# Patient Record
Sex: Male | Born: 1940 | Race: White | Hispanic: No | Marital: Married | Smoking: Never smoker
Health system: Southern US, Community
[De-identification: ages and names within clinical notes are randomized; demographics above are authoritative.]

## PROBLEM LIST (undated history)

## (undated) DIAGNOSIS — I4891 Unspecified atrial fibrillation: Secondary | ICD-10-CM

## (undated) DIAGNOSIS — E119 Type 2 diabetes mellitus without complications: Secondary | ICD-10-CM

## (undated) DIAGNOSIS — G629 Polyneuropathy, unspecified: Secondary | ICD-10-CM

## (undated) DIAGNOSIS — I498 Other specified cardiac arrhythmias: Secondary | ICD-10-CM

## (undated) DIAGNOSIS — I10 Essential (primary) hypertension: Secondary | ICD-10-CM

## (undated) DIAGNOSIS — E785 Hyperlipidemia, unspecified: Secondary | ICD-10-CM

## (undated) DIAGNOSIS — R0989 Other specified symptoms and signs involving the circulatory and respiratory systems: Secondary | ICD-10-CM

## (undated) DIAGNOSIS — I639 Cerebral infarction, unspecified: Secondary | ICD-10-CM

## (undated) HISTORY — DX: Cerebral infarction, unspecified: I63.9

## (undated) HISTORY — DX: Hyperlipidemia, unspecified: E78.5

## (undated) HISTORY — PX: ANAL FISSURE REPAIR: SHX2312

## (undated) HISTORY — DX: Type 2 diabetes mellitus without complications: E11.9

## (undated) HISTORY — DX: Unspecified atrial fibrillation: I48.91

## (undated) HISTORY — DX: Essential (primary) hypertension: I10

## (undated) HISTORY — DX: Other specified symptoms and signs involving the circulatory and respiratory systems: R09.89

## (undated) HISTORY — DX: Other specified cardiac arrhythmias: I49.8

## (undated) HISTORY — DX: Polyneuropathy, unspecified: G62.9

---

## 1999-01-30 ENCOUNTER — Encounter: Admission: RE | Admit: 1999-01-30 | Discharge: 1999-04-30 | Payer: Self-pay | Admitting: Orthopedic Surgery

## 2001-01-29 ENCOUNTER — Encounter: Admission: RE | Admit: 2001-01-29 | Discharge: 2001-01-29 | Payer: Self-pay | Admitting: Cardiology

## 2001-01-29 ENCOUNTER — Encounter: Payer: Self-pay | Admitting: Cardiology

## 2001-02-02 ENCOUNTER — Ambulatory Visit (HOSPITAL_COMMUNITY): Admission: RE | Admit: 2001-02-02 | Discharge: 2001-02-02 | Payer: Self-pay | Admitting: Cardiology

## 2001-02-02 HISTORY — PX: CARDIAC CATHETERIZATION: SHX172

## 2001-09-30 DIAGNOSIS — I639 Cerebral infarction, unspecified: Secondary | ICD-10-CM

## 2001-09-30 HISTORY — DX: Cerebral infarction, unspecified: I63.9

## 2002-06-16 ENCOUNTER — Encounter: Admission: RE | Admit: 2002-06-16 | Discharge: 2002-06-16 | Payer: Self-pay | Admitting: Internal Medicine

## 2002-06-16 ENCOUNTER — Encounter: Payer: Self-pay | Admitting: Internal Medicine

## 2002-07-19 ENCOUNTER — Encounter: Payer: Self-pay | Admitting: Neurology

## 2002-07-19 ENCOUNTER — Ambulatory Visit (HOSPITAL_COMMUNITY): Admission: RE | Admit: 2002-07-19 | Discharge: 2002-07-19 | Payer: Self-pay | Admitting: Neurology

## 2004-08-14 ENCOUNTER — Ambulatory Visit: Payer: Self-pay | Admitting: Internal Medicine

## 2004-09-04 ENCOUNTER — Ambulatory Visit: Payer: Self-pay | Admitting: Internal Medicine

## 2004-11-15 ENCOUNTER — Ambulatory Visit: Payer: Self-pay | Admitting: Internal Medicine

## 2005-01-16 ENCOUNTER — Ambulatory Visit: Payer: Self-pay | Admitting: Internal Medicine

## 2005-04-17 ENCOUNTER — Ambulatory Visit: Payer: Self-pay | Admitting: Internal Medicine

## 2005-07-17 ENCOUNTER — Ambulatory Visit: Payer: Self-pay | Admitting: Internal Medicine

## 2005-08-14 ENCOUNTER — Ambulatory Visit: Payer: Self-pay | Admitting: Internal Medicine

## 2005-10-03 ENCOUNTER — Ambulatory Visit: Payer: Self-pay | Admitting: Internal Medicine

## 2006-02-20 ENCOUNTER — Ambulatory Visit: Payer: Self-pay | Admitting: Internal Medicine

## 2006-05-06 ENCOUNTER — Ambulatory Visit: Payer: Self-pay | Admitting: Internal Medicine

## 2006-05-13 ENCOUNTER — Ambulatory Visit: Payer: Self-pay | Admitting: Internal Medicine

## 2006-06-05 ENCOUNTER — Ambulatory Visit: Payer: Self-pay | Admitting: Gastroenterology

## 2006-08-13 ENCOUNTER — Ambulatory Visit: Payer: Self-pay | Admitting: Internal Medicine

## 2006-08-15 ENCOUNTER — Ambulatory Visit: Payer: Self-pay | Admitting: Gastroenterology

## 2006-09-01 ENCOUNTER — Encounter (INDEPENDENT_AMBULATORY_CARE_PROVIDER_SITE_OTHER): Payer: Self-pay | Admitting: Specialist

## 2006-09-01 ENCOUNTER — Ambulatory Visit: Payer: Self-pay | Admitting: Gastroenterology

## 2006-09-01 ENCOUNTER — Encounter: Payer: Self-pay | Admitting: Internal Medicine

## 2006-09-10 ENCOUNTER — Ambulatory Visit: Payer: Self-pay | Admitting: Internal Medicine

## 2006-10-30 ENCOUNTER — Ambulatory Visit: Payer: Self-pay | Admitting: Internal Medicine

## 2006-12-08 ENCOUNTER — Ambulatory Visit: Payer: Self-pay | Admitting: Internal Medicine

## 2007-02-16 ENCOUNTER — Ambulatory Visit: Payer: Self-pay | Admitting: Internal Medicine

## 2007-02-17 LAB — CONVERTED CEMR LAB: Hgb A1c MFr Bld: 10.1 % — ABNORMAL HIGH (ref 4.6–6.0)

## 2007-02-24 ENCOUNTER — Encounter: Payer: Self-pay | Admitting: Internal Medicine

## 2007-02-24 DIAGNOSIS — Z8679 Personal history of other diseases of the circulatory system: Secondary | ICD-10-CM | POA: Insufficient documentation

## 2007-02-24 DIAGNOSIS — E785 Hyperlipidemia, unspecified: Secondary | ICD-10-CM | POA: Insufficient documentation

## 2007-02-24 DIAGNOSIS — G609 Hereditary and idiopathic neuropathy, unspecified: Secondary | ICD-10-CM | POA: Insufficient documentation

## 2007-02-24 DIAGNOSIS — I10 Essential (primary) hypertension: Secondary | ICD-10-CM | POA: Insufficient documentation

## 2007-02-24 DIAGNOSIS — E1140 Type 2 diabetes mellitus with diabetic neuropathy, unspecified: Secondary | ICD-10-CM | POA: Insufficient documentation

## 2007-03-03 ENCOUNTER — Ambulatory Visit: Payer: Self-pay | Admitting: Internal Medicine

## 2007-03-31 ENCOUNTER — Ambulatory Visit: Payer: Self-pay | Admitting: Internal Medicine

## 2007-04-14 ENCOUNTER — Encounter: Payer: Self-pay | Admitting: Internal Medicine

## 2007-04-14 ENCOUNTER — Ambulatory Visit: Payer: Self-pay | Admitting: Internal Medicine

## 2007-05-12 ENCOUNTER — Ambulatory Visit: Payer: Self-pay | Admitting: Internal Medicine

## 2007-06-16 ENCOUNTER — Ambulatory Visit: Payer: Self-pay | Admitting: Internal Medicine

## 2007-06-16 DIAGNOSIS — L259 Unspecified contact dermatitis, unspecified cause: Secondary | ICD-10-CM | POA: Insufficient documentation

## 2007-08-07 ENCOUNTER — Ambulatory Visit: Payer: Self-pay | Admitting: Internal Medicine

## 2007-08-07 LAB — CONVERTED CEMR LAB: Hgb A1c MFr Bld: 7.8 % — ABNORMAL HIGH (ref 4.6–6.0)

## 2007-08-12 ENCOUNTER — Ambulatory Visit: Payer: Self-pay | Admitting: Internal Medicine

## 2007-09-03 ENCOUNTER — Telehealth: Payer: Self-pay | Admitting: Internal Medicine

## 2007-11-03 ENCOUNTER — Ambulatory Visit: Payer: Self-pay | Admitting: Internal Medicine

## 2007-11-03 LAB — CONVERTED CEMR LAB
Creatinine,U: 75.5 mg/dL
Microalb, Ur: 0.4 mg/dL (ref 0.0–1.9)

## 2007-11-12 ENCOUNTER — Telehealth: Payer: Self-pay | Admitting: Internal Medicine

## 2007-12-14 ENCOUNTER — Telehealth: Payer: Self-pay | Admitting: Internal Medicine

## 2008-03-25 ENCOUNTER — Ambulatory Visit: Payer: Self-pay | Admitting: Internal Medicine

## 2008-03-28 ENCOUNTER — Encounter: Payer: Self-pay | Admitting: Internal Medicine

## 2008-03-28 LAB — CONVERTED CEMR LAB
ALT: 22 units/L (ref 0–53)
Albumin: 4.1 g/dL (ref 3.5–5.2)
Alkaline Phosphatase: 53 units/L (ref 39–117)
BUN: 25 mg/dL — ABNORMAL HIGH (ref 6–23)
CO2: 26 meq/L (ref 19–32)
Eosinophils Relative: 2.2 % (ref 0.0–5.0)
GFR calc Af Amer: 65 mL/min
Glucose, Bld: 184 mg/dL — ABNORMAL HIGH (ref 70–99)
HCT: 47.3 % (ref 39.0–52.0)
Hemoglobin: 16.3 g/dL (ref 13.0–17.0)
Lymphocytes Relative: 26.8 % (ref 12.0–46.0)
Monocytes Absolute: 0.7 10*3/uL (ref 0.1–1.0)
Monocytes Relative: 8.3 % (ref 3.0–12.0)
Neutro Abs: 5.4 10*3/uL (ref 1.4–7.7)
PSA: 1.46 ng/mL (ref 0.10–4.00)
Platelets: 208 10*3/uL (ref 150–400)
Potassium: 4.1 meq/L (ref 3.5–5.1)
Total CHOL/HDL Ratio: 6.8
Total Protein: 6.9 g/dL (ref 6.0–8.3)
WBC: 8.8 10*3/uL (ref 4.5–10.5)

## 2008-07-22 ENCOUNTER — Encounter: Payer: Self-pay | Admitting: Internal Medicine

## 2008-09-06 ENCOUNTER — Encounter: Payer: Self-pay | Admitting: Internal Medicine

## 2008-09-07 ENCOUNTER — Encounter: Admission: RE | Admit: 2008-09-07 | Discharge: 2008-09-07 | Payer: Self-pay | Admitting: Cardiology

## 2008-09-13 ENCOUNTER — Encounter: Payer: Self-pay | Admitting: Internal Medicine

## 2008-09-13 ENCOUNTER — Ambulatory Visit (HOSPITAL_COMMUNITY): Admission: RE | Admit: 2008-09-13 | Discharge: 2008-09-13 | Payer: Self-pay | Admitting: Cardiology

## 2008-09-13 HISTORY — PX: CARDIAC CATHETERIZATION: SHX172

## 2008-10-06 ENCOUNTER — Ambulatory Visit: Payer: Self-pay | Admitting: Internal Medicine

## 2008-10-06 LAB — CONVERTED CEMR LAB: Hgb A1c MFr Bld: 7.8 % — ABNORMAL HIGH (ref 4.6–6.0)

## 2008-10-13 ENCOUNTER — Telehealth: Payer: Self-pay | Admitting: Internal Medicine

## 2008-10-20 ENCOUNTER — Encounter: Payer: Self-pay | Admitting: Internal Medicine

## 2008-11-25 ENCOUNTER — Telehealth: Payer: Self-pay | Admitting: Internal Medicine

## 2008-12-01 ENCOUNTER — Telehealth: Payer: Self-pay | Admitting: Internal Medicine

## 2009-01-23 ENCOUNTER — Telehealth (INDEPENDENT_AMBULATORY_CARE_PROVIDER_SITE_OTHER): Payer: Self-pay | Admitting: *Deleted

## 2009-01-24 ENCOUNTER — Ambulatory Visit: Payer: Self-pay | Admitting: Internal Medicine

## 2009-01-24 DIAGNOSIS — R1011 Right upper quadrant pain: Secondary | ICD-10-CM | POA: Insufficient documentation

## 2009-01-24 LAB — CONVERTED CEMR LAB
ALT: 26 units/L (ref 0–53)
Albumin: 4.1 g/dL (ref 3.5–5.2)
Basophils Relative: 0.3 % (ref 0.0–3.0)
Bilirubin, Direct: 0.1 mg/dL (ref 0.0–0.3)
Blood Glucose, Fingerstick: 255
CO2: 30 meq/L (ref 19–32)
Chloride: 103 meq/L (ref 96–112)
Creatinine, Ser: 1.2 mg/dL (ref 0.4–1.5)
Eosinophils Absolute: 0.2 10*3/uL (ref 0.0–0.7)
Eosinophils Relative: 2.6 % (ref 0.0–5.0)
HCT: 47.8 % (ref 39.0–52.0)
Hemoglobin: 16.6 g/dL (ref 13.0–17.0)
Lymphs Abs: 2 10*3/uL (ref 0.7–4.0)
MCHC: 34.7 g/dL (ref 30.0–36.0)
MCV: 81.5 fL (ref 78.0–100.0)
Monocytes Absolute: 0.7 10*3/uL (ref 0.1–1.0)
Neutro Abs: 5.7 10*3/uL (ref 1.4–7.7)
Potassium: 4.3 meq/L (ref 3.5–5.1)
RBC: 5.86 M/uL — ABNORMAL HIGH (ref 4.22–5.81)
Sodium: 140 meq/L (ref 135–145)
Total Protein: 7.1 g/dL (ref 6.0–8.3)
WBC: 8.6 10*3/uL (ref 4.5–10.5)

## 2009-01-25 ENCOUNTER — Encounter: Admission: RE | Admit: 2009-01-25 | Discharge: 2009-01-25 | Payer: Self-pay | Admitting: Internal Medicine

## 2009-01-31 ENCOUNTER — Telehealth: Payer: Self-pay | Admitting: Internal Medicine

## 2009-02-21 ENCOUNTER — Ambulatory Visit: Payer: Self-pay | Admitting: Internal Medicine

## 2009-02-28 ENCOUNTER — Encounter: Payer: Self-pay | Admitting: Internal Medicine

## 2009-04-25 ENCOUNTER — Ambulatory Visit: Payer: Self-pay | Admitting: Internal Medicine

## 2009-04-26 LAB — CONVERTED CEMR LAB: Hgb A1c MFr Bld: 8.2 % — ABNORMAL HIGH (ref 4.6–6.5)

## 2009-06-26 ENCOUNTER — Ambulatory Visit: Payer: Self-pay | Admitting: Internal Medicine

## 2009-06-26 DIAGNOSIS — M65849 Other synovitis and tenosynovitis, unspecified hand: Secondary | ICD-10-CM

## 2009-06-26 DIAGNOSIS — M65839 Other synovitis and tenosynovitis, unspecified forearm: Secondary | ICD-10-CM | POA: Insufficient documentation

## 2009-09-08 ENCOUNTER — Ambulatory Visit: Payer: Self-pay | Admitting: Internal Medicine

## 2009-09-08 LAB — CONVERTED CEMR LAB: Cholesterol: 179 mg/dL (ref 0–200)

## 2009-11-02 ENCOUNTER — Ambulatory Visit: Payer: Self-pay | Admitting: Internal Medicine

## 2009-11-02 DIAGNOSIS — J069 Acute upper respiratory infection, unspecified: Secondary | ICD-10-CM | POA: Insufficient documentation

## 2009-11-07 ENCOUNTER — Ambulatory Visit: Payer: Self-pay | Admitting: Internal Medicine

## 2009-11-07 DIAGNOSIS — R350 Frequency of micturition: Secondary | ICD-10-CM | POA: Insufficient documentation

## 2009-11-07 LAB — CONVERTED CEMR LAB
Blood in Urine, dipstick: NEGATIVE
Protein, U semiquant: NEGATIVE
Specific Gravity, Urine: 1.02
Urobilinogen, UA: 0.2

## 2010-01-10 ENCOUNTER — Ambulatory Visit: Payer: Self-pay | Admitting: Internal Medicine

## 2010-01-10 LAB — CONVERTED CEMR LAB
Blood in Urine, dipstick: NEGATIVE
Hgb A1c MFr Bld: 8.5 % — ABNORMAL HIGH (ref 4.6–6.5)
Nitrite: NEGATIVE
PSA: 1.63 ng/mL (ref 0.10–4.00)
Protein, U semiquant: NEGATIVE
Total CK: 103 units/L (ref 7–232)
WBC Urine, dipstick: NEGATIVE
pH: 5

## 2010-01-11 ENCOUNTER — Encounter: Payer: Self-pay | Admitting: Internal Medicine

## 2010-01-12 ENCOUNTER — Telehealth: Payer: Self-pay | Admitting: Internal Medicine

## 2010-01-12 ENCOUNTER — Encounter: Payer: Self-pay | Admitting: Internal Medicine

## 2010-02-02 ENCOUNTER — Telehealth: Payer: Self-pay | Admitting: Internal Medicine

## 2010-02-07 ENCOUNTER — Encounter: Payer: Self-pay | Admitting: Internal Medicine

## 2010-03-20 ENCOUNTER — Telehealth: Payer: Self-pay | Admitting: Internal Medicine

## 2010-03-20 ENCOUNTER — Telehealth: Payer: Self-pay

## 2010-03-21 ENCOUNTER — Telehealth: Payer: Self-pay | Admitting: Internal Medicine

## 2010-03-28 ENCOUNTER — Ambulatory Visit: Payer: Self-pay | Admitting: Internal Medicine

## 2010-03-28 DIAGNOSIS — B029 Zoster without complications: Secondary | ICD-10-CM | POA: Insufficient documentation

## 2010-04-03 ENCOUNTER — Telehealth: Payer: Self-pay | Admitting: Internal Medicine

## 2010-04-27 ENCOUNTER — Telehealth: Payer: Self-pay | Admitting: Internal Medicine

## 2010-08-16 ENCOUNTER — Ambulatory Visit: Payer: Self-pay | Admitting: Internal Medicine

## 2010-08-16 LAB — CONVERTED CEMR LAB: Hgb A1c MFr Bld: 7.7 % — ABNORMAL HIGH (ref 4.6–6.5)

## 2010-08-21 ENCOUNTER — Telehealth: Payer: Self-pay | Admitting: Internal Medicine

## 2010-10-18 ENCOUNTER — Telehealth: Payer: Self-pay | Admitting: Internal Medicine

## 2010-10-21 ENCOUNTER — Encounter: Payer: Self-pay | Admitting: Orthopedic Surgery

## 2010-10-30 NOTE — Assessment & Plan Note (Signed)
Summary: consult re: urinary issues and dehydration/cjr   Vital Signs:  Patient profile:   70 year old male Weight:      225 pounds Temp:     97.5 degrees F oral BP sitting:   110 / 70  (right arm) Cuff size:   regular  Vitals Entered By: Raechel Ache, RN (November 07, 2009 11:19 AM) CC: c/o increased urine freq,  sudden urge    , c/o dry mouth   BS 160 on 2/7   CC:  c/o increased urine freq, sudden urge    , and c/o dry mouth   BS 160 on 2/7.  History of Present Illness: 70 year old diabetic, who presents with a 6-day history of increasing urinary frequency he also has some nocturia x 3 to 4.  He describes a sense of urgency, but no dysuria.  He states that he has had a prostate infection in the past and symptoms are quite similar.  He has had a recent URI and his insulin therapy down titrated slightly.  He is maintained good glycemic control, however, his blood pressure medicines, including diuretic therapy.  Have also been on hold until his acute illness resolves.  He has had no hypoglycemia.  He states that appetite has now normalized.  Denies any fever or chills.  His cough has resolved.  Allergies: 1)  ! Verapamil  Past History:  Past Medical History: Reviewed history from 03/25/2008 and no changes required. Diabetes mellitus, type II Hyperlipidemia Hypertension Peripheral neuropathy Cerebrovascular accident, hx of small vessel lacunar stroke 2003 Cardiac dysrhythmias; PSVT glaucoma  Review of Systems  The patient denies anorexia, fever, weight loss, weight gain, vision loss, decreased hearing, hoarseness, chest pain, syncope, dyspnea on exertion, peripheral edema, prolonged cough, headaches, hemoptysis, abdominal pain, melena, hematochezia, severe indigestion/heartburn, hematuria, incontinence, genital sores, muscle weakness, suspicious skin lesions, transient blindness, difficulty walking, depression, unusual weight change, abnormal bleeding, enlarged lymph nodes,  angioedema, breast masses, and testicular masses.    Physical Exam  General:  Well-developed,well-nourished,in no acute distress; alert,appropriate and cooperative throughout examination; no distress.  Blood pressure remains low normal 110/70 Head:  Normocephalic and atraumatic without obvious abnormalities. No apparent alopecia or balding. Eyes:  No corneal or conjunctival inflammation noted. EOMI. Perrla. Funduscopic exam benign, without hemorrhages, exudates or papilledema. Vision grossly normal. Mouth:  Oral mucosa and oropharynx without lesions or exudates.  Teeth in good repair. Neck:  No deformities, masses, or tenderness noted. Lungs:  Normal respiratory effort, chest expands symmetrically. Lungs are clear to auscultation, no crackles or wheezes. Heart:  Normal rate and regular rhythm. S1 and S2 normal without gallop, murmur, click, rub or other extra sounds. Abdomen:  Bowel sounds positive,abdomen soft and non-tender without masses, organomegaly or hernias noted.   Impression & Recommendations:  Problem # 1:  URINARY FREQUENCY (ICD-788.41)  Orders: UA Dipstick w/o Micro (manual) (16109) patient has trace pyuria on his urinalysis and symptoms are quite similar to a prior prostate infection.  Will maintain off diuretic therapy and treat with 10 days of Cipro  Problem # 2:  HYPERTENSION (ICD-401.9)  The following medications were removed from the medication list:    Hydrochlorothiazide 25 Mg Tabs (Hydrochlorothiazide) .Marland Kitchen... Take one (1) by mouth daily His updated medication list for this problem includes:    Lisinopril 20 Mg Tabs (Lisinopril) ..... One qd  Complete Medication List: 1)  Lantus Solostar 100 Unit/ml Soln (Insulin glargine) .... 28 units hs 2)  Novolog Flexpen 100 Unit/ml Soln (Insulin aspart) .Marland KitchenMarland KitchenMarland Kitchen  20  units ac 3)  Adult Aspirin Ec Low Strength 81 Mg Tbec (Aspirin) .Marland Kitchen.. 1 once daily 4)  Lisinopril 20 Mg Tabs (Lisinopril) .... One qd 5)  Lanoxin 0.25 Mg Tabs  (Digoxin) .Marland Kitchen.. 1 once daily 6)  Accu-chek Compact Test Drum Strp (Glucose blood) .... Use three times a day 7)  Naprosyn 500 Mg Tabs (Naproxen) .... One twice daily 8)  Lovastatin 40 Mg Tabs (Lovastatin) .... One daily 9)  Metformin Hcl 1000 Mg Tabs (Metformin hcl) .... One twice daily 10)  Cymbalta 60 Mg Cpep (Duloxetine hcl) .... One every am 11)  Hydrocodone-homatropine 5-1.5 Mg/46ml Syrp (Hydrocodone-homatropine) .Marland Kitchen.. 1 teaspoon every 6 hours as needed for cough 12)  Ciprofloxacin Hcl 500 Mg Tabs (Ciprofloxacin hcl) .... One twice daily  Patient Instructions: 1)  Please schedule a follow-up appointment in 3 months. 2)  Limit your Sodium (Salt). 3)  It is important that you exercise regularly at least 20 minutes 5 times a week. If you develop chest pain, have severe difficulty breathing, or feel very tired , stop exercising immediately and seek medical attention. 4)  You need to lose weight. Consider a lower calorie diet and regular exercise.  5)  Check your blood sugars regularly. If your readings are usually above : or below 70 you should contact our office. 6)  It is important that your Diabetic A1c level is checked every 3 months. 7)  Take your antibiotic as prescribed until ALL of it is gone, but stop if you develop a rash or swelling and contact our office as soon as possible. Prescriptions: CIPROFLOXACIN HCL 500 MG TABS (CIPROFLOXACIN HCL) one twice daily  #20and a x 0   Entered and Authorized by:   Gordy Savers  MD   Signed by:   Gordy Savers  MD on 11/07/2009   Method used:   Print then Give to Patient   RxID:   [id number]   Laboratory Results   Urine Tests    Routine Urinalysis   Color: yellow Appearance: Clear Glucose: negative   (Normal Range: Negative) Bilirubin: negative   (Normal Range: Negative) Ketone: negative   (Normal Range: Negative) Spec. Gravity: 1.020   (Normal Range: 1.003-1.035) Blood: negative   (Normal Range: Negative) pH: 5.0    (Normal Range: 5.0-8.0) Protein: negative   (Normal Range: Negative) Urobilinogen: 0.2   (Normal Range: 0-1) Nitrite: negative   (Normal Range: Negative) Leukocyte Esterace: trace   (Normal Range: Negative)

## 2010-10-30 NOTE — Miscellaneous (Signed)
Summary: lisinopril 40mg   Medications Added LISINOPRIL 40 MG TABS (LISINOPRIL) qd       Clinical Lists Changes  Medications: Changed medication from LISINOPRIL 20 MG  TABS (LISINOPRIL) one qd to LISINOPRIL 40 MG TABS (LISINOPRIL) qd - Signed Rx of LISINOPRIL 40 MG TABS (LISINOPRIL) qd;  #90 x 3;  Signed;  Entered by: Duard Brady LPN;  Authorized by: Gordy Savers  MD;  Method used: Electronically to [facility] [address], [place], [place]  [address], Ph: [phone] or [phone], Fax: [phone]    Prescriptions: LISINOPRIL 40 MG TABS (LISINOPRIL) qd  #90 x 3   Entered by:   Duard Brady LPN   Authorized by:   Gordy Savers  MD   Signed by:   Duard Brady LPN on 25/36/6440   Method used:   Electronically to        [facility] (retail)       [address]       [place], [place]  [address]       Ph: [phone] or [phone]       Fax: [phone]   RxID:   [id number]  takled with pharm - they have hard rx for 40 mg 01/2009 "autherized change" ,change made to med list and re efilled new rx/ [employer] [facility] [employer]

## 2010-10-30 NOTE — Miscellaneous (Signed)
Summary: new rx glimepiride  Medications Added GLIMEPIRIDE 4 MG TABS (GLIMEPIRIDE) 1 by mouth bid       Clinical Lists Changes  Medications: Added new medication of GLIMEPIRIDE 4 MG TABS (GLIMEPIRIDE) 1 by mouth bid - Signed Rx of GLIMEPIRIDE 4 MG TABS (GLIMEPIRIDE) 1 by mouth bid;  #180 x 4;  Signed;  Entered by: Duard Brady LPN;  Authorized by: Gordy Savers  MD;  Method used: Faxed to [facility] [address], [place], [place]  [id number], Ph: [phone] or [phone], Fax: [phone]    Prescriptions: GLIMEPIRIDE 4 MG TABS (GLIMEPIRIDE) 1 by mouth bid  #180 x 4   Entered by:   Duard Brady LPN   Authorized by:   Gordy Savers  MD   Signed by:   Duard Brady LPN on 17/61/6073   Method used:   Faxed to ...       [facility] (retail)       [address]       [place], [place]  [address]       Ph: [phone] or [phone]       Fax: [phone]   RxID:   [id number]  faxed tp pharm. - change to med list made - was not on list. KIK

## 2010-10-30 NOTE — Progress Notes (Signed)
Summary: rx for hctz  Phone Note Call from Patient Call back at Home Phone [phone]   Caller: Patient---live call Reason for Call: Talk to Nurse Summary of Call: need new rx HCTZ 25mg  1qd. Fax to [facility]. Initial call taken by: Warnell Forester,  March 21, 2010 11:33 AM  Follow-up for Phone Call        ok  #90 Follow-up by: Gordy Savers  MD,  March 21, 2010 12:14 PM  Additional Follow-up for Phone Call Additional follow up Details #1::        added back to med list and faxed to [facility]. KIK Additional Follow-up by: Duard Brady LPN,  March 21, 2010 1:09 PM    New/Updated Medications: HYDROCHLOROTHIAZIDE 25 MG TABS (HYDROCHLOROTHIAZIDE) qd Prescriptions: HYDROCHLOROTHIAZIDE 25 MG TABS (HYDROCHLOROTHIAZIDE) qd  #90 x 3   Entered by:   Duard Brady LPN   Authorized by:   Gordy Savers  MD   Signed by:   Duard Brady LPN on 14/78/2956   Method used:   Faxed to ...       [facility] (mail-order)             , [place]         Ph: [phone]       Fax: [phone]   RxID:   [id number]

## 2010-10-30 NOTE — Assessment & Plan Note (Signed)
Summary: ?UTI/PAIN/DISCOMFORT IN GROIN AREA/?PROSTATE ISSUES/CJR   Vital Signs:  Patient profile:   70 year old male Weight:      223 pounds Temp:     97.9 degrees F BP sitting:   120 / 70  (right arm) Cuff size:   regular  Vitals Entered By: Duard Brady LPN (January 10, 2010 11:04 AM) CC: c/o freq. urination, urine incontinence    fbs 161 Is Patient Diabetic? Yes Did you bring your meter with you today? No   CC:  c/o freq. urination and urine incontinence    fbs 161.  History of Present Illness: 70 year old patient who is seen today for follow-up of his diabetes.  Main complaints include polyuria and nocturia x 2.  Last visit.  He was treated empirically for a possible prostate infection.  UA was reviewed today and revealed glycosuria.  He states a fasting blood sugar this morning, 161.  His last hemoglobin A1c8.2.  He has treated hypertension and dyslipidemia.  He never has a blood sugar less than 120.  He presently is on Lantus 28 units at bedtime, as well as mealtime insulin  Allergies: 1)  ! Verapamil  Past History:  Past Medical History: Reviewed history from 03/25/2008 and no changes required. Diabetes mellitus, type II Hyperlipidemia Hypertension Peripheral neuropathy Cerebrovascular accident, hx of small vessel lacunar stroke 2003 Cardiac dysrhythmias; PSVT glaucoma  Review of Systems  The patient denies anorexia, fever, weight loss, weight gain, vision loss, decreased hearing, hoarseness, chest pain, syncope, dyspnea on exertion, peripheral edema, prolonged cough, headaches, hemoptysis, abdominal pain, melena, hematochezia, severe indigestion/heartburn, hematuria, incontinence, genital sores, muscle weakness, suspicious skin lesions, transient blindness, difficulty walking, depression, unusual weight change, abnormal bleeding, enlarged lymph nodes, angioedema, breast masses, and testicular masses.    Physical Exam  General:  overweight-appearing.  normal  blood pressure Head:  Normocephalic and atraumatic without obvious abnormalities. No apparent alopecia or balding. Eyes:  No corneal or conjunctival inflammation noted. EOMI. Perrla. Funduscopic exam benign, without hemorrhages, exudates or papilledema. Vision grossly normal. Mouth:  Oral mucosa and oropharynx without lesions or exudates.  Teeth in good repair. Neck:  No deformities, masses, or tenderness noted. Lungs:  Normal respiratory effort, chest expands symmetrically. Lungs are clear to auscultation, no crackles or wheezes. Heart:  Normal rate and regular rhythm. S1 and S2 normal without gallop, murmur, click, rub or other extra sounds. Abdomen:  Bowel sounds positive,abdomen soft and non-tender without masses, organomegaly or hernias noted.   Impression & Recommendations:  Problem # 1:  URINARY FREQUENCY (ICD-788.41)  Orders: UA Dipstick w/o Micro (manual) (19147)  Problem # 2:  PERIPHERAL NEUROPATHY (ICD-356.9)  Problem # 3:  DIABETES MELLITUS, TYPE II (ICD-250.00)  His updated medication list for this problem includes:    Lantus Solostar 100 Unit/ml Soln (Insulin glargine) .Marland KitchenMarland KitchenMarland KitchenMarland Kitchen 34  units hs    Novolog Flexpen 100 Unit/ml Soln (Insulin aspart) .Marland Kitchen... 20  units ac    Adult Aspirin Ec Low Strength 81 Mg Tbec (Aspirin) .Marland Kitchen... 1 once daily    Lisinopril 20 Mg Tabs (Lisinopril) ..... One qd    Metformin Hcl 1000 Mg Tabs (Metformin hcl) ..... One twice daily    His updated medication list for this problem includes:    Lantus Solostar 100 Unit/ml Soln (Insulin glargine) .Marland KitchenMarland KitchenMarland KitchenMarland Kitchen 34  units hs    Novolog Flexpen 100 Unit/ml Soln (Insulin aspart) .Marland Kitchen... 20  units ac    Adult Aspirin Ec Low Strength 81 Mg Tbec (Aspirin) .Marland Kitchen... 1 once daily  Lisinopril 20 Mg Tabs (Lisinopril) ..... One qd    Metformin Hcl 1000 Mg Tabs (Metformin hcl) ..... One twice daily  Orders: Venipuncture (16109) TLB-AST (SGOT) (84450-SGOT) TLB-A1C / Hgb A1C (Glycohemoglobin) (83036-A1C) TLB-PSA (Prostate  Specific Antigen) (84153-PSA) TLB-CK Total Only(Creatine Kinase/CPK) (82550-CK)  Problem # 4:  HYPERTENSION (ICD-401.9)  His updated medication list for this problem includes:    Lisinopril 20 Mg Tabs (Lisinopril) ..... One qd    His updated medication list for this problem includes:    Lisinopril 20 Mg Tabs (Lisinopril) ..... One qd  Orders: Venipuncture (60454) TLB-AST (SGOT) (84450-SGOT) TLB-A1C / Hgb A1C (Glycohemoglobin) (83036-A1C) TLB-PSA (Prostate Specific Antigen) (84153-PSA) TLB-CK Total Only(Creatine Kinase/CPK) (82550-CK)  Complete Medication List: 1)  Lantus Solostar 100 Unit/ml Soln (Insulin glargine) .... 34  units hs 2)  Novolog Flexpen 100 Unit/ml Soln (Insulin aspart) .... 20  units ac 3)  Adult Aspirin Ec Low Strength 81 Mg Tbec (Aspirin) .Marland Kitchen.. 1 once daily 4)  Lisinopril 20 Mg Tabs (Lisinopril) .... One qd 5)  Lanoxin 0.25 Mg Tabs (Digoxin) .Marland Kitchen.. 1 once daily 6)  Accu-chek Compact Test Drum Strp (Glucose blood) .... Use three times a day 7)  Naprosyn 500 Mg Tabs (Naproxen) .... One twice daily 8)  Lovastatin 40 Mg Tabs (Lovastatin) .... One daily 9)  Metformin Hcl 1000 Mg Tabs (Metformin hcl) .... One twice daily 10)  Cymbalta 60 Mg Cpep (Duloxetine hcl) .... One every am 11)  Hydrocodone-homatropine 5-1.5 Mg/70ml Syrp (Hydrocodone-homatropine) .Marland Kitchen.. 1 teaspoon every 6 hours as needed for cough 12)  Ciprofloxacin Hcl 500 Mg Tabs (Ciprofloxacin hcl) .... One twice daily  Patient Instructions: 1)  Please schedule a follow-up appointment in 3 months. 2)  Limit your Sodium (Salt). 3)  It is important that you exercise regularly at least 20 minutes 5 times a week. If you develop chest pain, have severe difficulty breathing, or feel very tired , stop exercising immediately and seek medical attention. 4)  You need to lose weight. Consider a lower calorie diet and regular exercise.  5)  Check your blood sugars regularly. If your readings are usually above : or below 70  you should contact our office. 6)  It is important that your Diabetic A1c level is checked every 3 months. 7)  See your eye doctor yearly to check for diabetic eye damage.  Laboratory Results   Urine Tests  Date/Time Received: January 10, 2010 11:06 AM  Date/Time Reported: January 10, 2010 11:06 AM   Routine Urinalysis   Color: yellow Appearance: Clear Glucose: 250   (Normal Range: Negative) Bilirubin: negative   (Normal Range: Negative) Ketone: trace (5)   (Normal Range: Negative) Spec. Gravity: >=1.030   (Normal Range: 1.003-1.035) Blood: negative   (Normal Range: Negative) pH: 5.0   (Normal Range: 5.0-8.0) Protein: negative   (Normal Range: Negative) Urobilinogen: 0.2   (Normal Range: 0-1) Nitrite: negative   (Normal Range: Negative) Leukocyte Esterace: negative   (Normal Range: Negative)

## 2010-10-30 NOTE — Progress Notes (Signed)
Summary: [facility] rx's  Phone Note Refill Request Message from:  Fax from Pharmacy on March 20, 2010 3:51 PM  Refills Requested: Medication #1:  LANTUS SOLOSTAR 100 UNIT/ML  SOLN 34  units hs  Medication #2:  METFORMIN HCL 1000 MG TABS one twice daily  Medication #3:  NOVOLOG FLEXPEN 100 UNIT/ML  SOLN 20  units ac new pharm - [facility]   Method Requested: Fax to Local Pharmacy Initial call taken by: Duard Brady LPN,  March 20, 2010 3:52 PM    Prescriptions: METFORMIN HCL 1000 MG TABS (METFORMIN HCL) one twice daily  #180 x 4   Entered by:   Duard Brady LPN   Authorized by:   Gordy Savers  MD   Signed by:   Duard Brady LPN on 16/07/9603   Method used:   Faxed to ...       [facility] (mail-order)             , [place]         Ph: [phone]       Fax: [phone]   RxID:   [id number] NOVOLOG FLEXPEN 100 UNIT/ML  SOLN (INSULIN ASPART) 20  units ac  #90 days x 4   Entered by:   Duard Brady LPN   Authorized by:   Gordy Savers  MD   Signed by:   Duard Brady LPN on 32/44/0102   Method used:   Faxed to ...       [facility] (mail-order)             , [place]         Ph: [phone]       Fax: [phone]   RxID:   [id number] LANTUS SOLOSTAR 100 UNIT/ML  SOLN (INSULIN GLARGINE) 34  units hs  #90 days x 4   Entered by:   Duard Brady LPN   Authorized by:   Gordy Savers  MD   Signed by:   Duard Brady LPN on 03/29/1600   Method used:   Faxed to ...       [facility] (mail-[facility])             , [place]         Ph: [phone]       Fax: [phone]   RxID:   [id number]

## 2010-10-30 NOTE — Assessment & Plan Note (Signed)
Summary: Upper respiratory inf/?strep/cjr   Vital Signs:  Patient profile:   70 year old male Weight:      225 pounds Temp:     99.0 degrees F oral BP sitting:   110 / 62  (left arm) Cuff size:   regular  Vitals Entered By: Raechel Ache, RN (November 02, 2009 1:13 PM) CC: C/o sick 3 days with fever, sore throat, sinus congestion and sore throat.   Is Patient Diabetic? Yes   CC:  C/o sick 3 days with fever, sore throat, and sinus congestion and sore throat.  Marland Kitchen  History of Present Illness: 70 year old patient history of diabetes, hypertension, who presents with a 3-day history of cough, chest and sinus congestion.  For the past 24 hours his developed a sore throat.  There is been some low-grade fever.  He feels hoarse and unwell.  Blood sugars have been up slightly over the past couple of days.  Denies any orthostatic symptoms  Allergies: 1)  ! Verapamil  Past History:  Past Medical History: Reviewed history from 03/25/2008 and no changes required. Diabetes mellitus, type II Hyperlipidemia Hypertension Peripheral neuropathy Cerebrovascular accident, hx of small vessel lacunar stroke 2003 Cardiac dysrhythmias; PSVT glaucoma  Review of Systems       The patient complains of anorexia, fever, hoarseness, and prolonged cough.  The patient denies weight loss, weight gain, vision loss, decreased hearing, chest pain, syncope, dyspnea on exertion, peripheral edema, headaches, hemoptysis, abdominal pain, melena, hematochezia, severe indigestion/heartburn, hematuria, incontinence, genital sores, muscle weakness, suspicious skin lesions, transient blindness, difficulty walking, depression, unusual weight change, abnormal bleeding, enlarged lymph nodes, angioedema, breast masses, and testicular masses.    Physical Exam  General:  overweight-appearing.  94/60overweight-appearing.   Head:  Normocephalic and atraumatic without obvious abnormalities. No apparent alopecia or balding. Eyes:   No corneal or conjunctival inflammation noted. EOMI. Perrla. Funduscopic exam benign, without hemorrhages, exudates or papilledema. Vision grossly normal. Ears:  External ear exam shows no significant lesions or deformities.  Otoscopic examination reveals clear canals, tympanic membranes are intact bilaterally without bulging, retraction, inflammation or discharge. Hearing is grossly normal bilaterally. Mouth:  pharyngeal erythema.  pharyngeal erythema.   Neck:  No deformities, masses, or tenderness noted. Lungs:  Normal respiratory effort, chest expands symmetrically. Lungs are clear to auscultation, no crackles or wheezes. Heart:  Normal rate and regular rhythm. S1 and S2 normal without gallop, murmur, click, rub or other extra sounds. Abdomen:  Bowel sounds positive,abdomen soft and non-tender without masses, organomegaly or hernias noted. Msk:  No deformity or scoliosis noted of thoracic or lumbar spine.     Impression & Recommendations:  Problem # 1:  HYPERTENSION (ICD-401.9)  His updated medication list for this problem includes:    Hydrochlorothiazide 25 Mg Tabs (Hydrochlorothiazide) .Marland Kitchen... Take one (1) by mouth daily    Lisinopril 20 Mg Tabs (Lisinopril) ..... One qd will hold his antihypertensive medication until his acute illness has resolved  His updated medication list for this problem includes:    Hydrochlorothiazide 25 Mg Tabs (Hydrochlorothiazide) .Marland Kitchen... Take one (1) by mouth daily    Lisinopril 20 Mg Tabs (Lisinopril) ..... One qd  Problem # 2:  URI (ICD-465.9)  His updated medication list for this problem includes:    Adult Aspirin Ec Low Strength 81 Mg Tbec (Aspirin) .Marland Kitchen... 1 once daily    Naprosyn 500 Mg Tabs (Naproxen) ..... One twice daily    Hydrocodone-homatropine 5-1.5 Mg/46ml Syrp (Hydrocodone-homatropine) .Marland Kitchen... 1 teaspoon every 6 hours  as needed for cough    Hydrocodone-homatropine 5-1.5 Mg/89ml Syrp (Hydrocodone-homatropine) .Marland Kitchen... 1 teaspoon every 6 hours as needed  for cough  Problem # 3:  DIABETES MELLITUS, TYPE II (ICD-250.00)  His updated medication list for this problem includes:    Lantus Solostar 100 Unit/ml Soln (Insulin glargine) .Marland Kitchen... 28 units hs    Novolog Flexpen 100 Unit/ml Soln (Insulin aspart) .Marland Kitchen... 20  units ac    Adult Aspirin Ec Low Strength 81 Mg Tbec (Aspirin) .Marland Kitchen... 1 once daily    Lisinopril 20 Mg Tabs (Lisinopril) ..... One qd    Metformin Hcl 1000 Mg Tabs (Metformin hcl) ..... One twice daily will decrease his mealtime insulin, slightly  Complete Medication List: 1)  Lantus Solostar 100 Unit/ml Soln (Insulin glargine) .... 28 units hs 2)  Novolog Flexpen 100 Unit/ml Soln (Insulin aspart) .... 20  units ac 3)  Hydrochlorothiazide 25 Mg Tabs (Hydrochlorothiazide) .... Take one (1) by mouth daily 4)  Adult Aspirin Ec Low Strength 81 Mg Tbec (Aspirin) .Marland Kitchen.. 1 once daily 5)  Lisinopril 20 Mg Tabs (Lisinopril) .... One qd 6)  Lanoxin 0.25 Mg Tabs (Digoxin) .Marland Kitchen.. 1 once daily 7)  Accu-chek Compact Test Drum Strp (Glucose blood) .... Use three times a day 8)  Naprosyn 500 Mg Tabs (Naproxen) .... One twice daily 9)  Lovastatin 40 Mg Tabs (Lovastatin) .... One daily 10)  Metformin Hcl 1000 Mg Tabs (Metformin hcl) .... One twice daily 11)  Cymbalta 60 Mg Cpep (Duloxetine hcl) .... One every am 12)  Hydrocodone-homatropine 5-1.5 Mg/46ml Syrp (Hydrocodone-homatropine) .Marland Kitchen.. 1 teaspoon every 6 hours as needed for cough  Patient Instructions: 1)  Get plenty of rest, drink lots of clear liquids, and use Tylenol or Ibuprofen for fever and comfort. Return in 7-10 days if you're not better:sooner if you're feeling worse. 2)  hold blood pressure medicines until your acute illness has resolved 3)  decreased mealtime insulin until your acute illness has resolved Prescriptions: HYDROCODONE-HOMATROPINE 5-1.5 MG/5ML SYRP (HYDROCODONE-HOMATROPINE) 1 teaspoon every 6 hours as needed for cough  #6 oz x 2   Entered and Authorized by:   Gordy Savers   MD   Signed by:   Gordy Savers  MD on 11/02/2009   Method used:   Print then Give to Patient   RxID:   [id number]

## 2010-10-30 NOTE — Progress Notes (Signed)
Summary: Pt req A1C results  Phone Note Call from Patient Call back at [facility] Phone [phone]   Caller: Patient Summary of Call: Pt called req A1C results. Pls call asap.  Initial call taken by: Lucy Antigua,  August 21, 2010 2:16 PM  Follow-up for Phone Call        hgh A1C 7.7- improved but still elevated; increase lantus by 4 units at bedtime; encourage weight loss and exercise Follow-up by: Gordy Savers  MD,  August 21, 2010 4:02 PM  Additional Follow-up for Phone Call Additional follow up Details #1::        spoke with pt - informed of lab and the need to increase lantus from 34 to 38 units - adjustment made to med sheet. KIk Additional Follow-up by: Duard Brady LPN,  August 22, 2010 8:51 AM    New/Updated Medications: LANTUS SOLOSTAR 100 UNIT/ML  SOLN (INSULIN GLARGINE) 38  units hs

## 2010-10-30 NOTE — Assessment & Plan Note (Signed)
Summary: rash/dm   Vital Signs:  Patient profile:   70 year old male Weight:      228 pounds Temp:     98.0 degrees F oral  Vitals Entered By: Duard Brady LPN (March 28, 2010 10:29 AM) CC: c/o (L) hip area     fbs 150 Is Patient Diabetic? Yes Did you bring your meter with you today? No   CC:  c/o (L) hip area     fbs 150.  History of Present Illness: 70 year old patient who has noted a small rash involving his left flank area present for about one week.  It is nonpainful and nonpruritic.  He has diabetes which has been under reasonable control.  He has hypertension and dyslipidemia  Allergies: 1)  ! Verapamil  Past History:  Past Medical History: Reviewed history from 03/25/2008 and no changes required. Diabetes mellitus, type II Hyperlipidemia Hypertension Peripheral neuropathy Cerebrovascular accident, hx of small vessel lacunar stroke 2003 Cardiac dysrhythmias; PSVT glaucoma  Review of Systems       The patient complains of suspicious skin lesions.  The patient denies anorexia, fever, weight loss, weight gain, vision loss, decreased hearing, hoarseness, chest pain, syncope, dyspnea on exertion, peripheral edema, prolonged cough, headaches, hemoptysis, abdominal pain, melena, hematochezia, severe indigestion/heartburn, hematuria, incontinence, genital sores, muscle weakness, transient blindness, difficulty walking, depression, unusual weight change, abnormal bleeding, enlarged lymph nodes, angioedema, breast masses, and testicular masses.    Physical Exam  General:  overweight-appearing.  120/70overweight-appearing.   Skin:  a patchy area of dermatitis, approximately 4 x 6 cm was noted involving the left flank area.  It was erythematous with some dry crusted superficial ulcers  Diabetes Management Exam:    Eye Exam:       Eye Exam done here today          Results: normal   Impression & Recommendations:  Problem # 1:  SHINGLES (ICD-053.9)  Problem # 2:   HYPERTENSION (ICD-401.9)  His updated medication list for this problem includes:    Lisinopril 40 Mg Tabs (Lisinopril) ..... Qd    Hydrochlorothiazide 25 Mg Tabs (Hydrochlorothiazide) ..... Qd  His updated medication list for this problem includes:    Lisinopril 40 Mg Tabs (Lisinopril) ..... Qd    Hydrochlorothiazide 25 Mg Tabs (Hydrochlorothiazide) ..... Qd  Problem # 3:  DIABETES MELLITUS, TYPE II (ICD-250.00)  His updated medication list for this problem includes:    Lantus Solostar 100 Unit/ml Soln (Insulin glargine) .Marland KitchenMarland KitchenMarland KitchenMarland Kitchen 34  units hs    Novolog Flexpen 100 Unit/ml Soln (Insulin aspart) .Marland Kitchen... 20  units ac    Adult Aspirin Ec Low Strength 81 Mg Tbec (Aspirin) .Marland Kitchen... 1 once daily    Lisinopril 40 Mg Tabs (Lisinopril) ..... Qd    Metformin Hcl 1000 Mg Tabs (Metformin hcl) ..... One twice daily    Glimepiride 4 Mg Tabs (Glimepiride) .Marland Kitchen... 1 by mouth bid  His updated medication list for this problem includes:    Lantus Solostar 100 Unit/ml Soln (Insulin glargine) .Marland KitchenMarland KitchenMarland KitchenMarland Kitchen 34  units hs    Novolog Flexpen 100 Unit/ml Soln (Insulin aspart) .Marland Kitchen... 20  units ac    Adult Aspirin Ec Low Strength 81 Mg Tbec (Aspirin) .Marland Kitchen... 1 once daily    Lisinopril 40 Mg Tabs (Lisinopril) ..... Qd    Metformin Hcl 1000 Mg Tabs (Metformin hcl) ..... One twice daily    Glimepiride 4 Mg Tabs (Glimepiride) .Marland Kitchen... 1 by mouth bid  Complete Medication List: 1)  Lantus Solostar 100 Unit/ml Soln (  Insulin glargine) .... 34  units hs 2)  Novolog Flexpen 100 Unit/ml Soln (Insulin aspart) .... 20  units ac 3)  Adult Aspirin Ec Low Strength 81 Mg Tbec (Aspirin) .Marland Kitchen.. 1 once daily 4)  Lisinopril 40 Mg Tabs (Lisinopril) .... Qd 5)  Lanoxin 0.25 Mg Tabs (Digoxin) .Marland Kitchen.. 1 once daily 6)  Accu-chek Compact Test Drum Strp (Glucose blood) .... Use three times a day 7)  Naprosyn 500 Mg Tabs (Naproxen) .... One twice daily 8)  Lovastatin 40 Mg Tabs (Lovastatin) .... One daily 9)  Metformin Hcl 1000 Mg Tabs (Metformin hcl) .... One  twice daily 10)  Cymbalta 60 Mg Cpep (Duloxetine hcl) .... One every am 11)  Hydrocodone-homatropine 5-1.5 Mg/68ml Syrp (Hydrocodone-homatropine) .Marland Kitchen.. 1 teaspoon every 6 hours as needed for cough 12)  Ciprofloxacin Hcl 500 Mg Tabs (Ciprofloxacin hcl) .... One twice daily 13)  Glimepiride 4 Mg Tabs (Glimepiride) .Marland Kitchen.. 1 by mouth bid 14)  Hydrochlorothiazide 25 Mg Tabs (Hydrochlorothiazide) .... Qd  Patient Instructions: 1)  Please schedule a follow-up appointment as needed.

## 2010-10-30 NOTE — Progress Notes (Signed)
Summary: [facility]   Phone Note Refill Request   Refills Requested: Medication #1:  LISINOPRIL 40 MG TABS qd  Medication #2:  GLIMEPIRIDE 4 MG TABS 1 by mouth bid.  Medication #3:  LOVASTATIN 40 MG TABS one daily  Medication #4:  LANOXIN 0.25 MG TABS 1 once daily changing from [facility] to [facility]   Method Requested: Electronic Initial call taken by: Duard Brady LPN,  March 20, 2010 3:49 PM    Prescriptions: GLIMEPIRIDE 4 MG TABS (GLIMEPIRIDE) 1 by mouth bid  #180 x 4   Entered by:   Duard Brady LPN   Authorized by:   Gordy Savers  MD   Signed by:   Duard Brady LPN on 16/07/9603   Method used:   Faxed to ...       [facility] (mail-order)             , [place]         Ph: [phone]       Fax: [phone]   RxID:   [id number] LOVASTATIN 40 MG TABS (LOVASTATIN) one daily  #90 x 4   Entered by:   Duard Brady LPN   Authorized by:   Gordy Savers  MD   Signed by:   Duard Brady LPN on 32/44/0102   Method used:   Faxed to ...       [facility] (mail-order)             , [place]         Ph: [phone]       Fax: [phone]   RxID:   [id number] LANOXIN 0.25 MG TABS (DIGOXIN) 1 once daily  #90 x 4   Entered by:   Duard Brady LPN   Authorized by:   Gordy Savers  MD   Signed by:   Duard Brady LPN on 03/29/1600   Method used:   Faxed to ...       [facility] (mail-order)             , [place]         Ph: [phone]       Fax: [phone]   RxID:   [id number] LISINOPRIL 40 MG TABS (LISINOPRIL) qd  #90 x 4   Entered by:   Duard Brady LPN   Authorized by:   Gordy Savers  MD   Signed by:   Duard Brady LPN on 71/02/2693   Method used:   Faxed to ...       [facility] (mail-order)             , [place]         Ph: [phone]       Fax: [phone]   RxID:   [id number]

## 2010-10-30 NOTE — Progress Notes (Signed)
Summary: BLOODWORK RESULTS  Phone Note Call from Patient Call back at Home Phone [phone]   Caller: Patient Call For: Gordy Savers  MD Summary of Call: PT WOULD LIKE BLOODWORK RESULTS Initial call taken by: Heron Sabins,  January 12, 2010 4:48 PM  Follow-up for Phone Call        all OK except HghA1C 8.5- poor diabetic control- titrate insulin as discussed and increase  exercise and lose weight Follow-up by: Gordy Savers  MD,  January 15, 2010 8:51 AM  Additional Follow-up for Phone Call Additional follow up Details #1::        pt came by to pick up labs - ha1c evalvted - must do diet and exercise. KIK Additional Follow-up by: Duard Brady LPN,  January 15, 2010 2:10 PM

## 2010-10-30 NOTE — Progress Notes (Signed)
Summary: Rx BD Pen  Phone Note From Pharmacy   Caller: [facility] Summary of Call: Pt requesting needles for insulin Initial call taken by: Kathrynn Speed CMA,  April 03, 2010 4:13 PM    New/Updated Medications: BD PEN NEEDLE MINI U/F 31G X 5 MM MISC (INSULIN PEN NEEDLE) As directed  Appended Document: Rx BD Pen Medications Added NOVOLOG FLEXPEN 100 UNIT/ML  SOLN (INSULIN ASPART) 26  units ac          Clinical Lists Changes  Medications: Changed medication from NOVOLOG FLEXPEN 100 UNIT/ML  SOLN (INSULIN ASPART) 20  units ac to NOVOLOG FLEXPEN 100 UNIT/ML  SOLN (INSULIN ASPART) 26  units ac - Signed Rx of BD PEN NEEDLE MINI U/F 31G X 5 MM MISC (INSULIN PEN NEEDLE) As directed;  #100 x 3;  Signed;  Entered by: Kathrynn Speed CMA;  Authorized by: Gordy Savers  MD;  Method used: Electronically to [facility] [address], [place], [place]  [id number], Ph: [phone] or [phone], Fax: [phone] Rx of NOVOLOG FLEXPEN 100 UNIT/ML  SOLN (INSULIN ASPART) 26  units ac;  #15 x 3;  Signed;  Entered by: Kathrynn Speed CMA;  Authorized by: Gordy Savers  MD;  Method used: Electronically to [facility] [address], [place], [place]  [id number], Ph: [phone] or [phone], Fax: [phone]    Prescriptions: NOVOLOG FLEXPEN 100 UNIT/ML  SOLN (INSULIN ASPART) 26  units ac  #15 x 3   Entered by:   Kathrynn Speed CMA   Authorized by:   Gordy Savers  MD   Signed by:   Kathrynn Speed CMA on 04/03/2010   Method used:   Electronically to        [facility] (retail)       [address]       [place], [place]  [id number]       Ph: [phone] or [phone]       Fax: [phone]   RxID:   [id number] BD PEN NEEDLE MINI U/F 31G X 5 MM MISC (INSULIN PEN NEEDLE) As directed  #100 x 3   Entered by:   Kathrynn Speed CMA   Authorized by:   Gordy Savers  MD   Signed by:   Kathrynn Speed CMA on 04/03/2010  Method used:   Electronically to        [facility] (retail)       [address]       [place], [place]  [id number]       Ph: [phone] or [phone]       Fax: [phone]   RxID:   [id number]

## 2010-10-30 NOTE — Progress Notes (Signed)
Summary: lantus sample  Phone Note Outgoing Call   Call placed by: Duard Brady LPN,  April 27, 2010 2:27 PM Call placed to: Patient Action Taken: Phone Call Completed Summary of Call: spoke with pt - - called about lantus sample I have in office for pick up - KIK Initial call taken by: Duard Brady LPN,  April 27, 2010 2:27 PM

## 2010-10-30 NOTE — Progress Notes (Signed)
Summary: lantus sig to pharmacy  Phone Note Refill Request Message from:  Fax from Pharmacy on Feb 02, 2010 1:29 PM  Refills Requested: Medication #1:  LANTUS SOLOSTAR 100 UNIT/ML  SOLN 34  units hs [facility]   [phone] ph        Method Requested: Fax to Local Pharmacy Initial call taken by: Duard Brady LPN,  Feb 02, 1609 1:29 PM    Prescriptions: LANTUS SOLOSTAR 100 UNIT/ML  SOLN (INSULIN GLARGINE) 34  units hs  #90 days x 3   Entered and Authorized by:   Duard Brady LPN   Signed by:   Duard Brady LPN on 96/12/5407   Method used:   Faxed to ...       [facility] (retail)       [address]       [place], [place]  [address]       Ph: [phone] or [phone]       Fax: [phone]   RxID:   [id number]  faxed to pharmacy   KIK

## 2010-10-30 NOTE — Progress Notes (Signed)
Summary: Pt is changing pharmacys to [facility] order. Req refills  Phone Note Call from Patient Call back at Home Phone [phone]   Caller: Patient Summary of Call: Pt is changing pharmacys from [facility] to [facility] order. Pt says that [facility] is going to be contacting Dr. Amador Cunas re: refills for the following meds: Metformin 1000mg , Lovastatin 40mg , Lisinopril 40mg , Lanoxin 0.25mg , HCTZ 25mg , Glimepride 4mg , Novalog Flex Pen, Lantus Solostar 100u/ML. Pt says these are the only ones he needs.  Initial call taken by: Lucy Antigua,  March 20, 2010 1:32 PM  Follow-up for Phone Call        noted - will await info from [facility] Follow-up by: Duard Brady LPN,  March 20, 2010 1:48 PM

## 2010-10-30 NOTE — Assessment & Plan Note (Signed)
Summary: cough/congestion/njr   Vital Signs:  Patient profile:   70 year old male Weight:      230 pounds Temp:     98.4 degrees F oral BP sitting:   126 / 80  (right arm) Cuff size:   regular  Vitals Entered By: Duard Brady LPN (August 16, 2010 9:48 AM) CC: head and chest congestion , productive cough, chills     fbs 122 ?machine old? Is Patient Diabetic? Yes Did you bring your meter with you today? Yes CBG Result 189   CC:  head and chest congestion , productive cough, and chills     fbs 122 ?machine old?Jimmy Carr  History of Present Illness: 70 year old patient who has a history of type 2 diabetes.  He has a 4 day history of head and chest congestion and nonproductive cough.  There is been no fever, but he has had some chills.  He has felt unwell.  His diabetes has been well-controlled.  No recent hemoglobin A1c however, he has treated hypertension, and dyslipidemia  Allergies: 1)  ! Verapamil  Past History:  Past Medical History: Reviewed history from 03/25/2008 and no changes required. Diabetes mellitus, type II Hyperlipidemia Hypertension Peripheral neuropathy Cerebrovascular accident, hx of small vessel lacunar stroke 2003 Cardiac dysrhythmias; PSVT glaucoma  Past Surgical History: Reviewed history from 10/06/2008 and no changes required. Anal fissure cardiac catheterization 2002 colonoscopy December 2007 carotid duplex evaluation 2003 Cardiolite  stress test 2007 heart catheterization December 2009  Family History: Reviewed history from 11/03/2007 and no changes required. details of his father's health unknown mother died in her mid 17s; history of cervical cancer, died of complications of pneumonia  Social History: Reviewed history from 03/25/2008 and no changes required. Married Never Smoked  Review of Systems       The patient complains of anorexia, fever, hoarseness, and prolonged cough.  The patient denies weight loss, weight gain, vision loss,  decreased hearing, chest pain, syncope, dyspnea on exertion, peripheral edema, headaches, hemoptysis, abdominal pain, melena, hematochezia, severe indigestion/heartburn, hematuria, incontinence, genital sores, muscle weakness, suspicious skin lesions, transient blindness, difficulty walking, depression, unusual weight change, abnormal bleeding, enlarged lymph nodes, angioedema, breast masses, and testicular masses.    Physical Exam  General:  overweight-appearing.  appears ill, but in no acute distress.  Blood pressure, normal.overweight-appearing.   Head:  Normocephalic and atraumatic without obvious abnormalities. No apparent alopecia or balding.  Eyes:  No corneal or conjunctival inflammation noted. EOMI. Perrla. Funduscopic exam benign, without hemorrhages, exudates or papilledema. Vision grossly [employer].no conjunctiva injection Ears:  External ear exam shows no significant lesions or deformities.  Otoscopic examination reveals clear canals, tympanic membranes are intact bilaterally without bulging, retraction, inflammation or discharge. Hearing is grossly normal bilaterally. Nose:  External nasal examination shows no deformity or inflammation. Nasal mucosa are pink and moist without lesions or exudates. Mouth:  pharyngeal erythema.  pharyngeal erythema.   Neck:  No deformities, masses, or tenderness noted. Lungs:  Normal respiratory effort, chest expands symmetrically. Lungs are clear to auscultation, no crackles or wheezes. Heart:  Normal rate and regular rhythm. S1 and S2 normal without gallop, murmur, click, rub or other extra sounds. Abdomen:  Bowel sounds positive,abdomen soft and non-tender without masses, organomegaly or hernias noted.   Impression & Recommendations:  Problem # 1:  URI (ICD-465.9)  His updated medication list for this problem includes:    Adult Aspirin Ec Low Strength 81 Mg Tbec (Aspirin) .Jimmy Carr... 1 once daily    Naprosyn 500 Mg  Tabs (Naproxen) ..... One twice  daily    Hydrocodone-homatropine 5-1.5 Mg/14ml Syrp (Hydrocodone-homatropine) .Jimmy Carr... 1 teaspoon every 6 hours as needed for cough  His updated medication list for this problem includes:    Adult Aspirin Ec Low Strength 81 Mg Tbec (Aspirin) .Jimmy Carr... 1 once daily    Naprosyn 500 Mg Tabs (Naproxen) ..... One twice daily    Hydrocodone-homatropine 5-1.5 Mg/23ml Syrp (Hydrocodone-homatropine) .Jimmy Carr... 1 teaspoon every 6 hours as needed for cough  Problem # 2:  HYPERTENSION (ICD-401.9)  His updated medication list for this problem includes:    Lisinopril 40 Mg Tabs (Lisinopril) ..... Qd    Hydrochlorothiazide 25 Mg Tabs (Hydrochlorothiazide) ..... Qd  His updated medication list for this problem includes:    Lisinopril 40 Mg Tabs (Lisinopril) ..... Qd    Hydrochlorothiazide 25 Mg Tabs (Hydrochlorothiazide) ..... Qd  Problem # 3:  DIABETES MELLITUS, TYPE II (ICD-250.00)  His updated medication list for this problem includes:    Lantus Solostar 100 Unit/ml Soln (Insulin glargine) .Jimmy KitchenMarland KitchenMarland KitchenMarland Carr 34  units hs    Novolog Flexpen 100 Unit/ml Soln (Insulin aspart) .Jimmy Carr... 26  units ac    Adult Aspirin Ec Low Strength 81 Mg Tbec (Aspirin) .Jimmy Carr... 1 once daily    Lisinopril 40 Mg Tabs (Lisinopril) ..... Qd    Metformin Hcl 1000 Mg Tabs (Metformin hcl) ..... One twice daily    Glimepiride 4 Mg Tabs (Glimepiride) .Jimmy Carr... 1 by mouth bid  Orders: Capillary Blood Glucose/CBG (03474) Venipuncture (25956) TLB-A1C / Hgb A1C (Glycohemoglobin) (83036-A1C) Specimen Handling (38756)  His updated medication list for this problem includes:    Lantus Solostar 100 Unit/ml Soln (Insulin glargine) .Jimmy KitchenMarland KitchenMarland KitchenMarland Carr 34  units hs    Novolog Flexpen 100 Unit/ml Soln (Insulin aspart) .Jimmy Carr... 26  units ac    Adult Aspirin Ec Low Strength 81 Mg Tbec (Aspirin) .Jimmy Carr... 1 once daily    Lisinopril 40 Mg Tabs (Lisinopril) ..... Qd    Metformin Hcl 1000 Mg Tabs (Metformin hcl) ..... One twice daily    Glimepiride 4 Mg Tabs (Glimepiride) .Jimmy Carr... 1 by mouth  bid  Problem # 4:  HYPERLIPIDEMIA (ICD-272.4)  His updated medication list for this problem includes:    Lovastatin 40 Mg Tabs (Lovastatin) ..... One daily  His updated medication list for this problem includes:    Lovastatin 40 Mg Tabs (Lovastatin) ..... One daily  Complete Medication List: 1)  Lantus Solostar 100 Unit/ml Soln (Insulin glargine) .... 34  units hs 2)  Novolog Flexpen 100 Unit/ml Soln (Insulin aspart) .... 26  units ac 3)  Adult Aspirin Ec Low Strength 81 Mg Tbec (Aspirin) .Jimmy Carr.. 1 once daily 4)  Lisinopril 40 Mg Tabs (Lisinopril) .... Qd 5)  Lanoxin 0.25 Mg Tabs (Digoxin) .Jimmy Carr.. 1 once daily 6)  Accu-chek Compact Test Drum Strp (Glucose blood) .... Use three times a day 7)  Naprosyn 500 Mg Tabs (Naproxen) .... One twice daily 8)  Lovastatin 40 Mg Tabs (Lovastatin) .... One daily 9)  Metformin Hcl 1000 Mg Tabs (Metformin hcl) .... One twice daily 10)  Cymbalta 60 Mg Cpep (Duloxetine hcl) .... One every am 11)  Hydrocodone-homatropine 5-1.5 Mg/94ml Syrp (Hydrocodone-homatropine) .Jimmy Carr.. 1 teaspoon every 6 hours as needed for cough 12)  Ciprofloxacin Hcl 500 Mg Tabs (Ciprofloxacin hcl) .... One twice daily 13)  Glimepiride 4 Mg Tabs (Glimepiride) .Jimmy Carr.. 1 by mouth bid 14)  Hydrochlorothiazide 25 Mg Tabs (Hydrochlorothiazide) .... Qd 15)  Bd Pen Needle Mini U/f 31g X 5 Mm Misc (Insulin pen needle) .... As  directed  Patient Instructions: 1)  Please schedule a follow-up appointment in 3 months. 2)  It is important that you exercise regularly at least 20 minutes 5 times a week. If you develop chest pain, have severe difficulty breathing, or feel very tired , stop exercising immediately and seek medical attention. 3)  You need to lose weight. Consider a lower calorie diet and regular exercise.  4)  Check your blood sugars regularly. If your readings are usually above : or below 70 you should contact our office. 5)  It is important that your Diabetic A1c level is checked every 3  months. 6)  See your eye doctor yearly to check for diabetic eye damage. 7)  Get plenty of rest, drink lots of clear liquids, and use Tylenol or Ibuprofen for fever and comfort. Return in 7-10 days if you're not better:sooner if you're feeling worse. Prescriptions: HYDROCODONE-HOMATROPINE 5-1.5 MG/5ML SYRP (HYDROCODONE-HOMATROPINE) 1 teaspoon every 6 hours as needed for cough  #6 oz x 2   Entered and Authorized by:   Gordy Savers  MD   Signed by:   Gordy Savers  MD on 08/16/2010   Method used:   Print then Give to Patient   RxID:   [id number]    Orders Added: 1)  Capillary Blood Glucose/CBG [82948] 2)  Est. Patient Level IV [14782] 3)  Venipuncture [95621] 4)  TLB-A1C / Hgb A1C (Glycohemoglobin) [83036-A1C] 5)  Specimen Handling [99000]

## 2010-11-01 NOTE — Progress Notes (Signed)
Summary: lantus clarification  Phone Note Refill Request Message from:  Fax from Pharmacy on October 18, 2010 11:54 AM  Refills Requested: Medication #1:  LANTUS SOLOSTAR 100 UNIT/ML  SOLN 38  units hs [facility] need clarification - pt states using 40 uints.    Method Requested: Fax to Local Pharmacy Initial call taken by: Duard Brady LPN,  October 18, 2010 11:55 AM  Follow-up for Phone Call        please advise Follow-up by: Duard Brady LPN,  October 18, 2010 11:55 AM  Additional Follow-up for Phone Call Additional follow up Details #1::        This is correct dose; patient needs 4 pens per 3 months Additional Follow-up by: Gordy Savers  MD,  October 18, 2010 12:57 PM    Additional Follow-up for Phone Call Additional follow up Details #2::    change to med list done - new rx faxed to pharmacy. KIK Follow-up by: Duard Brady LPN,  October 18, 2010 1:41 PM  New/Updated Medications: LANTUS SOLOSTAR 100 UNIT/ML  SOLN (INSULIN GLARGINE) 40  units hs Prescriptions: LANTUS SOLOSTAR 100 UNIT/ML  SOLN (INSULIN GLARGINE) 40  units hs  #90 day x 3   Entered by:   Duard Brady LPN   Authorized by:   Gordy Savers  MD   Signed by:   Duard Brady LPN on 16/07/9603   Method used:   Faxed to ...       [facility] (retail)       [address]       [place], [place]  [address]       Ph: [phone] or [phone]       Fax: [phone]   RxID:   [id number]

## 2010-11-02 NOTE — Medication Information (Signed)
Summary: Refill Request for Lisinopril  Refill Request for Lisinopril   Imported By: Maryln Gottron 01/16/2010 13:33:32  _____________________________________________________________________  External Attachment:    Type:   Image     Comment:   External Document

## 2010-11-06 ENCOUNTER — Encounter: Payer: Self-pay | Admitting: Internal Medicine

## 2010-11-07 ENCOUNTER — Ambulatory Visit (INDEPENDENT_AMBULATORY_CARE_PROVIDER_SITE_OTHER): Payer: Medicare Other | Admitting: Internal Medicine

## 2010-11-07 ENCOUNTER — Encounter: Payer: Self-pay | Admitting: Internal Medicine

## 2010-11-07 DIAGNOSIS — M549 Dorsalgia, unspecified: Secondary | ICD-10-CM

## 2010-11-07 DIAGNOSIS — E119 Type 2 diabetes mellitus without complications: Secondary | ICD-10-CM

## 2010-11-07 DIAGNOSIS — I10 Essential (primary) hypertension: Secondary | ICD-10-CM

## 2010-11-07 LAB — GLUCOSE, POCT (MANUAL RESULT ENTRY): POC Glucose: 154

## 2010-11-07 MED ORDER — INSULIN ASPART 100 UNIT/ML ~~LOC~~ SOLN: 26.0000 [IU] | Freq: Three times a day (TID) | SUBCUTANEOUS | Status: DC

## 2010-11-07 MED ORDER — LISINOPRIL 40 MG PO TABS: 40.0000 mg | ORAL_TABLET | Freq: Every day | ORAL | Status: DC

## 2010-11-07 MED ORDER — DIGOXIN 250 MCG PO TABS: 250.0000 ug | ORAL_TABLET | Freq: Every day | ORAL | Status: DC

## 2010-11-07 MED ORDER — NAPROXEN 500 MG PO TABS: 500.0000 mg | ORAL_TABLET | Freq: Two times a day (BID) | ORAL | Status: DC

## 2010-11-07 MED ORDER — CYCLOBENZAPRINE HCL 10 MG PO TABS: 10.0000 mg | ORAL_TABLET | Freq: Three times a day (TID) | ORAL | Status: DC | PRN

## 2010-11-07 MED ORDER — METFORMIN HCL 1000 MG PO TABS: 1000.0000 mg | ORAL_TABLET | Freq: Two times a day (BID) | ORAL | Status: DC

## 2010-11-07 MED ORDER — HYDROCHLOROTHIAZIDE 25 MG PO TABS: 25.0000 mg | ORAL_TABLET | Freq: Every day | ORAL | Status: DC

## 2010-11-07 MED ORDER — GLIMEPIRIDE 4 MG PO TABS: 4.0000 mg | ORAL_TABLET | Freq: Two times a day (BID) | ORAL | Status: DC

## 2010-11-07 MED ORDER — INSULIN GLARGINE 100 UNIT/ML ~~LOC~~ SOLN: 40.0000 [IU] | Freq: Every day | SUBCUTANEOUS | Status: DC

## 2010-11-07 MED ORDER — DULOXETINE HCL 60 MG PO CPEP: 60.0000 mg | ORAL_CAPSULE | ORAL | Status: DC

## 2010-11-07 NOTE — Progress Notes (Signed)
  Subjective:    Patient ID: Jimmy Carr, male    DOB: August 16, 1941, 69 y.o.   MRN: [id number]  HPI  70 year old patient has a history of type 2 diabetes.  This has been clinically stable.  He also has treated hypertension, which has been stable.  He presents today with a chief complaint of left lower lumbar pain of one week's duration.  He felt this began when he was  Playing  with his grandchild doing lifting and bending.  No prior history of low back pain.  No radicular symptoms.   Review of Systems  Constitutional: Negative for fever, chills, appetite change and fatigue.  HENT: Negative for hearing loss, ear pain, congestion, sore throat, trouble swallowing, neck stiffness, dental problem, voice change and tinnitus.   Eyes: Negative for pain, discharge and visual disturbance.  Respiratory: Negative for cough, chest tightness, wheezing and stridor.   Cardiovascular: Negative for chest pain, palpitations and leg swelling.  Gastrointestinal: Negative for nausea, vomiting, abdominal pain, diarrhea, constipation, blood in stool and abdominal distention.  Genitourinary: Negative for urgency, hematuria, flank pain, discharge, difficulty urinating and genital sores.  Musculoskeletal: Negative for myalgias, back pain, joint swelling, arthralgias and gait problem.  Skin: Negative for rash.  Neurological: Negative for dizziness, syncope, speech difficulty, weakness, numbness and headaches.  Hematological: Negative for adenopathy. Does not bruise/bleed easily.  Psychiatric/Behavioral: Negative for behavioral problems and dysphoric mood. The patient is not nervous/anxious.        Objective:   Physical Exam  Constitutional: He is oriented to person, place, and time.  Neck: Normal range of motion.  Abdominal: Soft. Bowel sounds are normal.  Musculoskeletal: Normal range of motion. He exhibits no edema and no tenderness.       There is slight tenderness and tight tense musculature involving the  left lumbar spine area.  Straight leg testing was negative.  Neurovascular structures intact  Neurological: He is alert and oriented to person, place, and time.  Psychiatric: He has a normal mood and affect. His behavior is normal.          Assessment & Plan:  Back pain- this appears to be musculoligamentous.  He does have naproxen at home and will start taking twice daily.  Flexeril added to his regimen.  Low back pain instructions dispensed Hypertension stable Type 2 diabetes, stable

## 2010-11-07 NOTE — Patient Instructions (Signed)
Most patients with low back pain will improve with time over the next two to 6 weeks.  Keep active but avoid any activities that cause pain.  Apply moist heat to the low back area several times daily.  Call or return to clinic prn if these symptoms worsen or fail to improve as anticipated.  

## 2010-11-16 ENCOUNTER — Ambulatory Visit: Payer: Self-pay | Admitting: Internal Medicine

## 2010-12-24 ENCOUNTER — Encounter: Payer: Self-pay | Admitting: Internal Medicine

## 2011-01-08 ENCOUNTER — Encounter: Payer: Self-pay | Admitting: Internal Medicine

## 2011-01-08 ENCOUNTER — Ambulatory Visit (INDEPENDENT_AMBULATORY_CARE_PROVIDER_SITE_OTHER): Payer: Medicare Other | Admitting: Internal Medicine

## 2011-01-08 DIAGNOSIS — E785 Hyperlipidemia, unspecified: Secondary | ICD-10-CM

## 2011-01-08 DIAGNOSIS — I10 Essential (primary) hypertension: Secondary | ICD-10-CM

## 2011-01-08 DIAGNOSIS — R350 Frequency of micturition: Secondary | ICD-10-CM

## 2011-01-08 DIAGNOSIS — E119 Type 2 diabetes mellitus without complications: Secondary | ICD-10-CM

## 2011-01-08 LAB — HEPATIC FUNCTION PANEL
ALT: 31 U/L (ref 0–53)
AST: 20 U/L (ref 0–37)
Albumin: 3.9 g/dL (ref 3.5–5.2)
Alkaline Phosphatase: 62 U/L (ref 39–117)
Bilirubin, Direct: 0.2 mg/dL (ref 0.0–0.3)
Total Bilirubin: 0.9 mg/dL (ref 0.3–1.2)
Total Protein: 6.9 g/dL (ref 6.0–8.3)

## 2011-01-08 LAB — CBC WITH DIFFERENTIAL/PLATELET
Basophils Absolute: 0.1 K/uL (ref 0.0–0.1)
Basophils Relative: 0.6 % (ref 0.0–3.0)
Eosinophils Absolute: 0.3 K/uL (ref 0.0–0.7)
Eosinophils Relative: 2.7 % (ref 0.0–5.0)
HCT: 48.9 % (ref 39.0–52.0)
Hemoglobin: 16.8 g/dL (ref 13.0–17.0)
Lymphocytes Relative: 26.1 % (ref 12.0–46.0)
Lymphs Abs: 2.4 K/uL (ref 0.7–4.0)
MCHC: 34.3 g/dL (ref 30.0–36.0)
MCV: 83.5 fl (ref 78.0–100.0)
Monocytes Absolute: 0.7 K/uL (ref 0.1–1.0)
Monocytes Relative: 8 % (ref 3.0–12.0)
Neutro Abs: 5.8 K/uL (ref 1.4–7.7)
Neutrophils Relative %: 62.6 % (ref 43.0–77.0)
Platelets: 205 K/uL (ref 150.0–400.0)
RBC: 5.86 Mil/uL — ABNORMAL HIGH (ref 4.22–5.81)
RDW: 14.6 % (ref 11.5–14.6)
WBC: 9.2 K/uL (ref 4.5–10.5)

## 2011-01-08 LAB — BASIC METABOLIC PANEL
BUN: 22 mg/dL (ref 6–23)
Calcium: 9.7 mg/dL (ref 8.4–10.5)
Creatinine, Ser: 1.4 mg/dL (ref 0.4–1.5)
GFR: 51.88 mL/min — ABNORMAL LOW (ref >60.00)

## 2011-01-08 LAB — TSH: TSH: 1.15 u[IU]/mL (ref 0.35–5.50)

## 2011-01-08 LAB — HEMOGLOBIN A1C: Hgb A1c MFr Bld: 8.1 % — ABNORMAL HIGH (ref 4.6–6.5)

## 2011-01-08 LAB — PSA: PSA: 2.5 ng/mL (ref 0.10–4.00)

## 2011-01-08 NOTE — Progress Notes (Signed)
  Subjective:    Patient ID: Jimmy Carr, male    DOB: [birth year], 70 y.o.   MRN: [id number]  HPI 70 year old patient who is seen today for followup. He has type 2 diabetes which is controlled on 40 units of Lantus at bedtime. Fasting blood sugars are generally well-controlled. He takes 26 units prior to his daily meals with sliding scale coverage of 4-6 or 8 extra units. Complaints today include right foot swelling. This has been atraumatic and painless. He does relate this to excessive salt use. This is improving. Denies any symptoms of heart failure He has treated hypertension which has done well. Blood pressure today is well controlled He has a history of fatigue that has worsened over the past several weeks. No recent lab except hemoglobin A1c   Review of Systems  Constitutional: Positive for fatigue. Negative for fever, chills and appetite change.  HENT: Negative for hearing loss, ear pain, congestion, sore throat, trouble swallowing, neck stiffness, dental problem, voice change and tinnitus.   Eyes: Negative for pain, discharge and visual disturbance.  Respiratory: Negative for cough, chest tightness, wheezing and stridor.   Cardiovascular: Negative for chest pain, palpitations and leg swelling.  Gastrointestinal: Negative for nausea, vomiting, abdominal pain, diarrhea, constipation, blood in stool and abdominal distention.  Genitourinary: Negative for urgency, hematuria, flank pain, discharge, difficulty urinating and genital sores.  Musculoskeletal: Negative for myalgias, back pain, joint swelling, arthralgias and gait problem.  Skin: Negative for rash.  Neurological: Positive for weakness. Negative for dizziness, syncope, speech difficulty, numbness and headaches.  Hematological: Negative for adenopathy. Does not bruise/bleed easily.  Psychiatric/Behavioral: Negative for behavioral problems and dysphoric mood. The patient is not nervous/anxious.        Objective:   Physical  Exam  Constitutional: He is oriented to person, place, and time. He appears well-developed.  HENT:  Head: Normocephalic.  Right Ear: External ear normal.  Left Ear: External ear normal.  Eyes: Conjunctivae and EOM are normal.  Neck: Normal range of motion.  Cardiovascular: Normal rate, regular rhythm, normal heart sounds and intact distal pulses.   Pulmonary/Chest: Breath sounds normal.  Abdominal: Bowel sounds are normal.  Musculoskeletal: Normal range of motion. He exhibits no edema and no tenderness.       Mild right foot edema  Neurological: He is alert and oriented to person, place, and time.  Psychiatric: He has a normal mood and affect. His behavior is normal.          Assessment & Plan:  Diabetes mellitus type 2. We'll check a hemo-May once he Fatigue. We'll check a laboratory update including liver function studies Dyslipidemia. We'll check a transaminase Hypertension. Well controlled

## 2011-01-08 NOTE — Patient Instructions (Signed)
Limit your sodium (Salt) intake  You need to lose weight.  Consider a lower calorie diet and regular exercise.    It is important that you exercise regularly, at least 20 minutes 3 to 4 times per week.  If you develop chest pain or shortness of breath seek  medical attention.   Please check your hemoglobin A1c every 3 months  Return in 3 months for follow-up

## 2011-01-09 ENCOUNTER — Other Ambulatory Visit: Payer: Self-pay | Admitting: Internal Medicine

## 2011-01-09 MED ORDER — INSULIN ASPART 100 UNIT/ML ~~LOC~~ SOLN: 32.0000 [IU] | Freq: Three times a day (TID) | SUBCUTANEOUS | Status: DC

## 2011-01-09 NOTE — Progress Notes (Signed)
Quick Note:  Spoke with pt - informed of lab results and the need to increase mealtime insulin by 6 units - will order new dose and send to pharmacy. KIK ______

## 2011-01-24 ENCOUNTER — Other Ambulatory Visit: Payer: Self-pay | Admitting: Internal Medicine

## 2011-02-12 NOTE — Cardiovascular Report (Signed)
Jimmy Carr, Jimmy Carr              ACCOUNT NO.:  [id number]   MEDICAL RECORD NO.:  [id number]          PATIENT TYPE:  OIB   LOCATION:  2899                         FACILITY:  [facility]   PHYSICIAN:  Thereasa Solo. Little, M.D. DATE OF BIRTH:  27-Feb-1941   DATE OF PROCEDURE:  09/13/2008  DATE OF DISCHARGE:                            CARDIAC CATHETERIZATION   INDICATIONS FOR TEST:  This 70 year old longstanding diabetic has a  history of hypertension.  He was seen by Dr. Garen Lah with a variety of  relatively nonspecific complaints but had diffuse T-wave inversion in  inferior and lateral leads with some left arm numbness.  Because of  this, a decision was made to bring him in for a heart catheterization.   After obtaining informed consent, the patient was prepped and draped in  the usual sterile fashion exposing the right groin.  Following local  anesthetic with 1% Xylocaine, the Seldinger technique was employed and a  5-French introducer sheath was placed in the right femoral artery.  Left  and right coronary arteriography, ventriculography in the RAO  projection, and a distal aortogram was performed.   COMPLICATIONS:  None.   TOTAL CONTRAST:  125 mL.   EQUIPMENT:  5-French Judkins configuration catheters.   It should be pointed out that Dr. Garen Lah did the right and left  coronary angiograms.   RESULTS:  1. Hemodynamic monitoring:  His central aortic pressure was 124/72.      His left ventricular pressure was 124/3.  There was no aortic valve      gradient was noted at the time of pullback.  2. Ventriculography:  Ventriculography in the RAO projection revealed      normal LV systolic function.  Ejection fraction in excess of 60%      and an end-diastolic pressure of 8.  3. Distal aortogram:  A distal aortogram done above the level of renal      arteries using 20 mL of contrast at 15 mL per second revealed good      opacification of the aorta and the renal arteries.  There was no    evidence of abdominal aortic aneurysm, no renal artery stenosis,      and no evidence of any proximal iliac disease.  4. Coronary arteriography:      a.     Left main was basically normal and bifurcated.      b.     LAD crossed the apex of the heart.  The proximal LAD had       mild irregularities.  There were several small diagonal branches       that were free of disease.      c.     Circumflex.  The circumflex gave rise to a moderate OM that       bifurcated, this was free of disease.  There were some very minor       proximal OM vessels that were less than a millimeter in diameter       and less than 10-15 mm in length that appeared to have some ostial  narrowing.  These were too small for any type of intervention and       that were clinically significant.      d.     Right coronary artery.  The right coronary artery had some       proximal irregularities of 30%.  The PDA and posterolateral       vessels were normal.   CONCLUSION:  1. Minimal coronary artery disease.  2. Normal LV systolic function.  3. No evidence of renal artery stenosis, distal aortic disease, or      iliac disease.   I cannot explain his EKG based on his cath data.  He had been on  metformin which has been held.  He can restart metformin on September 15, 2008.           ______________________________  Thereasa Solo. Little, M.D.     ABL/MEDQ  D:  09/13/2008  T:  09/13/2008  Job:  [id number]   cc:   Gordy Savers, MD  Sheliah Mends, MD  Catheterization Lab

## 2011-02-15 NOTE — Cardiovascular Report (Signed)
[facility]  Patient:    Jimmy Carr, Jimmy Carr                     MRN: [id number] Proc. Date: 02/02/01 Adm. Date:  [id number] Attending:  Loreli Dollar CC:         Cardiac Catheterization Lab   Cardiac Catheterization  INDICATIONS FOR TEST:  Mr. Dibiasio is a 70 year old male who has had increasing tiredness and fatigue of two weeks duration and one week of anterior right and left-sided chest pain with radiation to the back occurring with physical activity and sometimes with rest.  EKG shows T wave abnormalities in the inferolateral leads.  PROCEDURE: 1. Left heart catheterization. 2. Selective right and left coronary arteriography. 3. Ventriculography in the RAO projection.  EQUIPMENT:  The 6-French Judkins configuration catheters.  COMPLICATIONS:  None.  DESCRIPTION OF PROCEDURE:  The patient was prepped and draped in the usual sterile fashion exposing the right groin.  Applying local anesthetic of 1% Xylocaine, the Seldinger technique was employed, and a 6-French introducer sheath was placed in the right femoral artery.  Selective right and left coronary arteriography and ventriculography were performed.  Informed consent was obtained before the procedure was started.  The patient was premedicated with 25 mg of IV Benadryl.  RESULTS: 1. Hemodynamic monitoring:  Central aortic pressure was 127/78.  Left    ventricular pressure was 127/18.  There was no aortic valve gradient noted    at the time of pullback. 2. Ventriculography:  Ventriculography in the RAO projection revealed normal    left ventricular systolic function.  Ejection fraction calculated at 70%.    There was questionable mitral valve prolapse with no evidence of mitral    regurgitation.  End diastolic pressure was 21. 3. Coronary arteriography:  No calcification was noted on fluoroscopy.    a. Left main:  Normal.    b. LAD:  the LAD extended down to the apex of the heart  and gave rise to       two small diagonal branches.  The system was free of disease.    c. Circumflex:  The circumflex was a large vessel with a very large OM #1       and a smaller OM #2.  This system was free of disease.    d. Right coronary artery:  The right coronary artery was a dominant vessel       giving rise to the PDA and posterolateral vessel.  This system was free       of disease.  CONCLUSIONS: 1. Normal left ventricular systolic function. 2. Questionable mitral valve prolapse without mitral regurgitation. 3. New evidence of coronary disease.  At this point, it does not appear his chest pain is cardiac in origin despite multiple risk factors and EKG abnormalities.  Will discharge to home today with follow-up in the office tomorrow. DD:  02/02/01 TD:  02/02/01 Job: [id number]

## 2011-02-15 NOTE — Assessment & Plan Note (Signed)
[facility] OFFICE NOTE   Jimmy Carr, Jimmy Carr                     MRN:          [id number]  DATE:05/13/2006                            DOB:          Dec 29, 1940    The patient is a 70 year old gentleman seen today for a comprehensive  evaluation.  He is followed by [facility] and has had a recent  stress test.  This revealed no perfusion defects and a normal ejection  fraction.  He has a long history of type 2 diabetes, hypertension,  hyperlipidemia.  In 2003 he was hospitalized for a stroke characterized by  left hand and facial numbness.  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  He has glaucoma, history of  cardiac dysrhythmias.   He has no known allergies.   He describes some rare palpitations but nothing that sounds like sustained  paroxysmal atrial fibrillation.  He also complains of bilateral shoulder  discomfort and has been evaluated by an orthopedic physician.  Only surgery  is surgery for an anal fissure.   FAMILY HISTORY:  Largely noncontributory.  Details:  His father's health  unknown.  Mother died in her mid-80s with a history of cervical cancer.  She  died from pneumonia.  No siblings.   PHYSICAL EXAMINATION:  GENERAL:  Examination revealed a mildly overweight  male who appeared healthy and fit.  VITAL SIGNS:  Blood pressure is 120/80.  HEENT:  Fundi, ear, nose and throat clear.  NECK:  No bruits.  CHEST:  Clear.  CARDIOVASCULAR:  Regular rhythm without murmur.  ABDOMEN:  Benign.  GENITOURINARY:  External genitalia normal.  RECTAL:  Prostate +2 and benign.  Stool heme-negative.  EXTREMITIES:  Full peripheral pulses.  Diminished to vibratory sensation and  monofilament testing.   IMPRESSION:  1. Diabetes with peripheral neuropathy.  2. History of paroxysmal atrial fibrillation.  3. Hypertension.  4. Hyperlipidemia.  5. 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   DISPOSITION:  Medical regimen unchanged.  Compliance stressed.  His  hemoglobin A1c has consistently been over 8.  This will be rechecked in 3  months.  He has lost 10 pounds, and nonpharmacologic measures stressed.  A  screening colonoscopy will be set up.                                   Gordy Savers, MD   PFK/MedQ  DD:  05/13/2006  DT:  05/13/2006  Job #:  [id number]

## 2011-03-01 ENCOUNTER — Other Ambulatory Visit: Payer: Self-pay | Admitting: Internal Medicine

## 2011-03-01 MED ORDER — INSULIN PEN NEEDLE 31G X 5 MM MISC: 1.0000 | Freq: Four times a day (QID) | Status: DC

## 2011-03-01 NOTE — Telephone Encounter (Signed)
Pt called and is req his B-D UF III MINI PEN NEEDLES 31G X 5 MM MISC called in to [facility] [address] in [place] [phone]

## 2011-03-11 ENCOUNTER — Encounter: Payer: Self-pay | Admitting: Internal Medicine

## 2011-03-11 ENCOUNTER — Ambulatory Visit (INDEPENDENT_AMBULATORY_CARE_PROVIDER_SITE_OTHER): Payer: Medicare Other | Admitting: Internal Medicine

## 2011-03-11 DIAGNOSIS — E119 Type 2 diabetes mellitus without complications: Secondary | ICD-10-CM

## 2011-03-11 DIAGNOSIS — E785 Hyperlipidemia, unspecified: Secondary | ICD-10-CM

## 2011-03-11 DIAGNOSIS — N529 Male erectile dysfunction, unspecified: Secondary | ICD-10-CM

## 2011-03-11 DIAGNOSIS — I10 Essential (primary) hypertension: Secondary | ICD-10-CM

## 2011-03-11 MED ORDER — INSULIN GLARGINE 100 UNIT/ML ~~LOC~~ SOLN: 40.0000 [IU] | Freq: Every day | SUBCUTANEOUS | Status: DC

## 2011-03-11 MED ORDER — SILDENAFIL CITRATE 100 MG PO TABS: 100.0000 mg | ORAL_TABLET | ORAL | Status: DC | PRN

## 2011-03-11 NOTE — Progress Notes (Signed)
  Subjective:    Patient ID: Jimmy Carr, male    DOB: 06/30/1941, 70 y.o.   MRN: [id number]  HPI  70 year old patient who has a history of type 2 diabetes hypertension and dyslipidemia. In the past 3 days he has had cough congestion fatigue and low-grade fever. No chest pain purulent sputum, high  fever or chills. He is on maintenance and mealtime insulin for diabetic control. He has hypertension which has been controlled on Zestril and diuretic therapy.   Review of Systems  Constitutional: Negative for fever, chills, appetite change and fatigue.  HENT: Positive for congestion. Negative for hearing loss, ear pain, sore throat, trouble swallowing, neck stiffness, dental problem, voice change and tinnitus.   Eyes: Negative for pain, discharge and visual disturbance.  Respiratory: Positive for cough. Negative for chest tightness, wheezing and stridor.   Cardiovascular: Negative for chest pain, palpitations and leg swelling.  Gastrointestinal: Negative for nausea, vomiting, abdominal pain, diarrhea, constipation, blood in stool and abdominal distention.  Genitourinary: Negative for urgency, hematuria, flank pain, discharge, difficulty urinating and genital sores.  Musculoskeletal: Negative for myalgias, back pain, joint swelling, arthralgias and gait problem.  Skin: Negative for rash.  Neurological: Negative for dizziness, syncope, speech difficulty, weakness, numbness and headaches.  Hematological: Negative for adenopathy. Does not bruise/bleed easily.  Psychiatric/Behavioral: Negative for behavioral problems and dysphoric mood. The patient is not nervous/anxious.        Objective:   Physical Exam  Constitutional: He is oriented to person, place, and time. He appears well-developed.       Appears unwell but in no acute distress  HENT:  Head: Normocephalic.  Right Ear: External ear normal.  Left Ear: External ear normal.       Mild erythema of the oropharynx  Eyes: Conjunctivae and  EOM are normal.  Neck: Normal range of motion.  Cardiovascular: Normal rate and normal heart sounds.   Pulmonary/Chest: Breath sounds normal.  Abdominal: Bowel sounds are normal.  Musculoskeletal: Normal range of motion. He exhibits no edema and no tenderness.  Neurological: He is alert and oriented to person, place, and time.  Psychiatric: He has a normal mood and affect. His behavior is normal.          Assessment & Plan:   Viral URI. Will treat symptomatically Diabetes mellitus. Will check a hemoglobin A1c Hypertension stable Erectile dysfunction. Will give a trial of Viagra

## 2011-03-11 NOTE — Patient Instructions (Signed)
Get plenty of rest, Drink lots of  clear liquids, and use Tylenol or ibuprofen for fever and discomfort.    Call or return to clinic prn if these symptoms worsen or fail to improve as anticipated.  

## 2011-04-29 ENCOUNTER — Ambulatory Visit (INDEPENDENT_AMBULATORY_CARE_PROVIDER_SITE_OTHER): Payer: Medicare Other | Admitting: Internal Medicine

## 2011-04-29 ENCOUNTER — Encounter: Payer: Self-pay | Admitting: Internal Medicine

## 2011-04-29 DIAGNOSIS — E119 Type 2 diabetes mellitus without complications: Secondary | ICD-10-CM

## 2011-04-29 DIAGNOSIS — I10 Essential (primary) hypertension: Secondary | ICD-10-CM

## 2011-04-29 MED ORDER — VARDENAFIL HCL 20 MG PO TABS: 20.0000 mg | ORAL_TABLET | ORAL | Status: DC | PRN

## 2011-04-29 MED ORDER — LOVASTATIN 40 MG PO TABS: 40.0000 mg | ORAL_TABLET | Freq: Every day | ORAL | Status: DC

## 2011-04-29 MED ORDER — METFORMIN HCL 1000 MG PO TABS: 1000.0000 mg | ORAL_TABLET | Freq: Two times a day (BID) | ORAL | Status: DC

## 2011-04-29 MED ORDER — INSULIN ASPART 100 UNIT/ML ~~LOC~~ SOLN: 32.0000 [IU] | Freq: Three times a day (TID) | SUBCUTANEOUS | Status: DC

## 2011-04-29 MED ORDER — DIGOXIN 250 MCG PO TABS: 250.0000 ug | ORAL_TABLET | Freq: Every day | ORAL | Status: DC

## 2011-04-29 NOTE — Progress Notes (Signed)
  Subjective:    Patient ID: Jimmy Carr, male    DOB: 11/27/1940, 70 y.o.   MRN: [id number]  HPI A 70 year old patient who is seen today for followup of his diabetes. He is on 32 units of mealtime insulin as well as Lantus insulin. Fasting blood sugars are generally essentially normal in the 100-120 range. Postprandial blood sugars are 160-180. He feels well today. He does require multiple medications refilled. He has resumed Amaryl. This has been discontinued in the past  Review of Systems  Constitutional: Negative for fever, chills, appetite change and fatigue.  HENT: Negative for hearing loss, ear pain, congestion, sore throat, trouble swallowing, neck stiffness, dental problem, voice change and tinnitus.   Eyes: Negative for pain, discharge and visual disturbance.  Respiratory: Negative for cough, chest tightness, wheezing and stridor.   Cardiovascular: Negative for chest pain, palpitations and leg swelling.  Gastrointestinal: Negative for nausea, vomiting, abdominal pain, diarrhea, constipation, blood in stool and abdominal distention.  Genitourinary: Negative for urgency, hematuria, flank pain, discharge, difficulty urinating and genital sores.  Musculoskeletal: Negative for myalgias, back pain, joint swelling, arthralgias and gait problem.  Skin: Negative for rash.  Neurological: Negative for dizziness, syncope, speech difficulty, weakness, numbness and headaches.  Hematological: Negative for adenopathy. Does not bruise/bleed easily.  Psychiatric/Behavioral: Negative for behavioral problems and dysphoric mood. The patient is not nervous/anxious.        Objective:   Physical Exam  Constitutional: He appears well-developed and well-nourished. No distress.       Blood pressure 110/70          Assessment & Plan:   No problem-specific assessment & plan notes found for this encounter.  Hypertension well controlled Diabetes mellitus. We'll check a hemoglobin A1c today if  greater than 7 we'll increase his mealtime insulin from 32-40 units. Will recheck in 3 months

## 2011-04-29 NOTE — Patient Instructions (Signed)
Limit your sodium (Salt) intake    It is important that you exercise regularly, at least 20 minutes 3 to 4 times per week.  If you develop chest pain or shortness of breath seek  medical attention.   Please check your hemoglobin A1c every 3 months   

## 2011-04-30 LAB — HEMOGLOBIN A1C: Hgb A1c MFr Bld: 7.8 % — ABNORMAL HIGH (ref 4.6–6.5)

## 2011-05-01 ENCOUNTER — Other Ambulatory Visit: Payer: Self-pay | Admitting: Internal Medicine

## 2011-05-01 MED ORDER — INSULIN ASPART 100 UNIT/ML ~~LOC~~ SOLN: 40.0000 [IU] | Freq: Three times a day (TID) | SUBCUTANEOUS | Status: DC

## 2011-05-01 NOTE — Progress Notes (Signed)
Quick Note:  Spoke with pt- informed of lab and increase in mealtime insulin - change to med list and new rx sent to [facility]. ______

## 2011-05-09 ENCOUNTER — Other Ambulatory Visit: Payer: Self-pay | Admitting: Internal Medicine

## 2011-07-01 ENCOUNTER — Encounter: Payer: Self-pay | Admitting: Internal Medicine

## 2011-07-01 ENCOUNTER — Ambulatory Visit (INDEPENDENT_AMBULATORY_CARE_PROVIDER_SITE_OTHER): Payer: Medicare Other | Admitting: Internal Medicine

## 2011-07-01 DIAGNOSIS — I10 Essential (primary) hypertension: Secondary | ICD-10-CM

## 2011-07-01 DIAGNOSIS — J069 Acute upper respiratory infection, unspecified: Secondary | ICD-10-CM

## 2011-07-01 DIAGNOSIS — E119 Type 2 diabetes mellitus without complications: Secondary | ICD-10-CM

## 2011-07-01 MED ORDER — HYDROCODONE-HOMATROPINE 5-1.5 MG/5ML PO SYRP: 5.0000 mL | ORAL_SOLUTION | Freq: Four times a day (QID) | ORAL | Status: AC | PRN

## 2011-07-01 NOTE — Patient Instructions (Signed)
Get plenty of rest, Drink lots of  clear liquids, and use Tylenol or ibuprofen for fever and discomfort.    Call or return to clinic prn if these symptoms worsen or fail to improve as anticipated.  

## 2011-07-01 NOTE — Progress Notes (Signed)
  Subjective:    Patient ID: Jimmy Carr, male    DOB: [birth year], 70 y.o.   MRN: [id number]  HPI  70 year old patient who is seen today for followup. For the past few days she has had head and chest congestion and nonproductive cough is also had sore throat and hoarseness. Several family members have been ill. No fever or chills or purulent sputum production. No wheezing or shortness of breath. He does have diabetes treated hypertension and dyslipidemia.    Review of Systems  Constitutional: Positive for fatigue. Negative for fever, chills and appetite change.  HENT: Positive for congestion, rhinorrhea and postnasal drip. Negative for hearing loss, ear pain, sore throat, trouble swallowing, neck stiffness, dental problem, voice change and tinnitus.   Eyes: Negative for pain, discharge and visual disturbance.  Respiratory: Positive for cough. Negative for chest tightness, wheezing and stridor.   Cardiovascular: Negative for chest pain, palpitations and leg swelling.  Gastrointestinal: Negative for nausea, vomiting, abdominal pain, diarrhea, constipation, blood in stool and abdominal distention.  Genitourinary: Negative for urgency, hematuria, flank pain, discharge, difficulty urinating and genital sores.  Musculoskeletal: Negative for myalgias, back pain, joint swelling, arthralgias and gait problem.  Skin: Negative for rash.  Neurological: Negative for dizziness, syncope, speech difficulty, weakness, numbness and headaches.  Hematological: Negative for adenopathy. Does not bruise/bleed easily.  Psychiatric/Behavioral: Negative for behavioral problems and dysphoric mood. The patient is not nervous/anxious.        Objective:   Physical Exam  Constitutional: He is oriented to person, place, and time. He appears well-developed.       Hoarse;  no acute distress. Afebrile  HENT:  Head: Normocephalic.  Right Ear: External ear normal.  Left Ear: External ear normal.  Eyes: Conjunctivae  and EOM are normal.  Neck: Normal range of motion.  Cardiovascular: Normal rate and normal heart sounds.   Pulmonary/Chest: Breath sounds normal.  Abdominal: Bowel sounds are normal.  Musculoskeletal: Normal range of motion. He exhibits no edema and no tenderness.  Neurological: He is alert and oriented to person, place, and time.  Psychiatric: He has a normal mood and affect. His behavior is normal.          Assessment & Plan:   Viral URI. Will treat with Hydromet Tylenol or Advil Diabetes. Well controlled Hypertension stable

## 2011-07-05 LAB — GLUCOSE, CAPILLARY
Glucose-Capillary: 236 mg/dL — ABNORMAL HIGH (ref 70–99)
Glucose-Capillary: 331 mg/dL — ABNORMAL HIGH (ref 70–99)

## 2011-07-24 ENCOUNTER — Encounter: Payer: Self-pay | Admitting: Internal Medicine

## 2011-07-24 ENCOUNTER — Ambulatory Visit (INDEPENDENT_AMBULATORY_CARE_PROVIDER_SITE_OTHER): Payer: Medicare Other | Admitting: Internal Medicine

## 2011-07-24 VITALS — BP 124/70 | HR 100 | Temp 98.7°F | Wt 227.0 lb

## 2011-07-24 DIAGNOSIS — J40 Bronchitis, not specified as acute or chronic: Secondary | ICD-10-CM

## 2011-07-24 DIAGNOSIS — E119 Type 2 diabetes mellitus without complications: Secondary | ICD-10-CM

## 2011-07-24 MED ORDER — DOXYCYCLINE HYCLATE 100 MG PO CAPS: 100.0000 mg | ORAL_CAPSULE | Freq: Two times a day (BID) | ORAL | Status: AC

## 2011-07-24 NOTE — Patient Instructions (Signed)
You have a wheezy bronchitis from infection..  This could resolve on its own but may improve quicker with this antibiotic . Ok to take the cough med for comfort. Call if any shortness of breath or getting worse or fever.  Consider  Inhaler    Use  If needed.

## 2011-07-24 NOTE — Progress Notes (Signed)
  Subjective:    Patient ID: Jimmy Carr, male    DOB: 1940-10-23, 70 y.o.   MRN: [id number]  HPI  Patient comes in today for SDA  For acute problem evaluation. Has had uri and saw Dr Kirtland Bouchard  And then now in chest.   And coughing  Up green phlegm for 2-3 days and then  Less now.  No blood.  Wheezing for 2 day. No hx of asthma or copd . Others sick at home. One has "walking pneumonia"  Tried no meds.  Unsure if should take cough med or not No fever chills .   Review of Systems non fever  But has  cough deep and worse at night no cp  palpitation.    Past history family history social history reviewed in the electronic medical record.  Objective:   Physical Exam wdwn on nad WDWN in NAD  quiet respirations; mildly congested   Non toxic . HEENT: Normocephalic ;atraumatic , Eyes;  PERRL, EOMs  Full, lids and conjunctiva clear,,Ears: no deformities, canals nl, TM landmarks normal, Nose: no deformity or discharge but congested;face minimally tender Mouth : OP clear without lesion or edema . Face non tender Neck: Supple without adenopathy or masses or bruits Chest:  Scattered wheezez expiratory no rales  Ok air movement CV:  S1-S2 no gallops or murmurs peripheral perfusion is normal Skin :nl perfusion and no acute rashes  No clubbing cyanosis or edema      Assessment & Plan:  Wheezy bronchitis   With discolored phlegm   Over 8 days of sx .  This still could be self resolving but getting worse and reasonable to add antibiotic   Atypicals possible .  Risk benefit of medication discussed.  Diabetic  Hx of cV other risk. Stable .

## 2011-08-23 ENCOUNTER — Other Ambulatory Visit: Payer: Self-pay | Admitting: Internal Medicine

## 2011-08-23 NOTE — Telephone Encounter (Signed)
Pharmacy requesting refill on insulin aspart (NOVOLOG FLEXPEN) 100 UNIT/ML injection Pt is completely out. Please contact pharmacy

## 2011-09-30 ENCOUNTER — Telehealth: Payer: Self-pay | Admitting: Internal Medicine

## 2011-09-30 MED ORDER — LISINOPRIL 40 MG PO TABS: 40.0000 mg | ORAL_TABLET | Freq: Every day | ORAL | Status: DC

## 2011-09-30 NOTE — Telephone Encounter (Signed)
Needs 5 or 6 pills of Lisinopril called to [facility], until his mail order arrives. He is completely out.

## 2011-09-30 NOTE — Telephone Encounter (Signed)
Done - #14 sent

## 2011-10-07 ENCOUNTER — Telehealth: Payer: Self-pay | Admitting: Internal Medicine

## 2011-10-07 NOTE — Telephone Encounter (Signed)
[employer] faxed tier exception request form over,form in DR K folder

## 2011-10-07 NOTE — Telephone Encounter (Signed)
Form recv'd - placed in dr. Vernon Prey folder to complete - no lantus avibl at this time

## 2011-10-07 NOTE — Telephone Encounter (Signed)
Pt need nurse to call [employer] [phone] concerning lantus. Pt needs a sample. Pt ID #[id number]. [facility] [address] [place]

## 2011-10-08 ENCOUNTER — Telehealth: Payer: Self-pay | Admitting: Internal Medicine

## 2011-10-08 MED ORDER — INSULIN ASPART 100 UNIT/ML ~~LOC~~ SOLN: 40.0000 [IU] | Freq: Three times a day (TID) | SUBCUTANEOUS | Status: DC

## 2011-10-08 MED ORDER — INSULIN GLARGINE 100 UNIT/ML ~~LOC~~ SOLN: 40.0000 [IU] | Freq: Every day | SUBCUTANEOUS | Status: DC

## 2011-10-08 NOTE — Telephone Encounter (Signed)
Pt came by office and said that he needs the Novlog flex pens changed from qty of 5 pens to 10 pens and the pt says that he needs the script for Lantus to be changed from 1 box to 2 boxes. Pt says that these changes are due to pts insurance coverage. Pt says that he is low on Novolog and needs this sent to [facility] on [address] in [place].

## 2011-10-08 NOTE — Telephone Encounter (Signed)
[facility] sent [facility]

## 2011-11-07 ENCOUNTER — Telehealth: Payer: Self-pay | Admitting: Family Medicine

## 2011-11-07 MED ORDER — METFORMIN HCL 1000 MG PO TABS: 1000.0000 mg | ORAL_TABLET | Freq: Two times a day (BID) | ORAL | Status: DC

## 2011-11-07 MED ORDER — LISINOPRIL 40 MG PO TABS: 40.0000 mg | ORAL_TABLET | Freq: Every day | ORAL | Status: DC

## 2011-11-07 NOTE — Telephone Encounter (Signed)
Rx sent to mail order pharmacy  

## 2011-11-07 NOTE — Telephone Encounter (Signed)
Patient needs new scripts on his Metformin 1000mg  and Lisinopril 40mg  sent to his mail order Rx, [facility]. Fax #: [phone]. Mail order would like you to send in 90-days supplies of both. Thanks!

## 2011-11-15 ENCOUNTER — Telehealth: Payer: Self-pay | Admitting: Internal Medicine

## 2011-11-15 MED ORDER — HYDROCHLOROTHIAZIDE 25 MG PO TABS: 25.0000 mg | ORAL_TABLET | Freq: Every day | ORAL | Status: DC

## 2011-11-15 MED ORDER — DIGOXIN 250 MCG PO TABS: 250.0000 ug | ORAL_TABLET | Freq: Every day | ORAL | Status: DC

## 2011-11-15 NOTE — Telephone Encounter (Signed)
Patient calling stating that he needs new rxs for mail order pharmacy([facility]). Digoxin tabs .25 mg and Hydoclothiazide 25 mg to be faxed to [phone]. Please assist and inform patient when done.

## 2011-11-15 NOTE — Telephone Encounter (Signed)
done

## 2011-11-20 ENCOUNTER — Other Ambulatory Visit: Payer: Self-pay | Admitting: Internal Medicine

## 2011-11-21 ENCOUNTER — Telehealth: Payer: Self-pay | Admitting: Internal Medicine

## 2011-11-21 NOTE — Telephone Encounter (Signed)
This was done thru e-request rx this AM

## 2011-11-21 NOTE — Telephone Encounter (Signed)
Pt is out of b-d ultrafine short pen 31g x34mm call into [facility] [phone]

## 2011-11-27 ENCOUNTER — Other Ambulatory Visit: Payer: Self-pay | Admitting: Internal Medicine

## 2011-11-27 NOTE — Telephone Encounter (Signed)
Pt needs refill on accu-chek test strips call into [facility] [phone]

## 2011-11-28 MED ORDER — GLUCOSE BLOOD VI STRP: 1.0000 | ORAL_STRIP | Freq: Three times a day (TID) | Status: DC

## 2011-11-28 NOTE — Telephone Encounter (Signed)
done

## 2011-12-12 ENCOUNTER — Other Ambulatory Visit: Payer: Self-pay | Admitting: Internal Medicine

## 2011-12-12 NOTE — Telephone Encounter (Signed)
Pt requesting refill on insulin glargine (LANTUS SOLOSTAR) 100 UNIT/ML injection  [facility] [address]

## 2011-12-13 MED ORDER — INSULIN GLARGINE 100 UNIT/ML ~~LOC~~ SOLN: 40.0000 [IU] | Freq: Every day | SUBCUTANEOUS | Status: DC

## 2011-12-26 ENCOUNTER — Encounter: Payer: Self-pay | Admitting: Family

## 2011-12-26 ENCOUNTER — Ambulatory Visit (INDEPENDENT_AMBULATORY_CARE_PROVIDER_SITE_OTHER): Payer: Medicare Other | Admitting: Family

## 2011-12-26 VITALS — BP 162/80 | Temp 98.6°F | Wt 235.0 lb

## 2011-12-26 DIAGNOSIS — M79609 Pain in unspecified limb: Secondary | ICD-10-CM

## 2011-12-26 DIAGNOSIS — G609 Hereditary and idiopathic neuropathy, unspecified: Secondary | ICD-10-CM

## 2011-12-26 DIAGNOSIS — M79672 Pain in left foot: Secondary | ICD-10-CM

## 2011-12-26 DIAGNOSIS — G629 Polyneuropathy, unspecified: Secondary | ICD-10-CM

## 2011-12-26 MED ORDER — GABAPENTIN 100 MG PO CAPS: 100.0000 mg | ORAL_CAPSULE | Freq: Three times a day (TID) | ORAL | Status: DC

## 2011-12-26 NOTE — Progress Notes (Signed)
Subjective:    Patient ID: Jimmy Carr, male    DOB: [birth year], 71 y.o.   MRN: [id number]  HPI Comments: C/o lt great toe discomfort described as "sharp, constant" every few minutes new onset 3 days ago. Currently rates pain as 0/10, gets worse at hs and is better with ambulation. Denies injury. Inspects feet daily, has been a diabetic for 30 years. Uses OTC neutrogenia lotion at night. Hot baths relieve discomfort as well. Denies h/o gout. Also request skin tag removal from chin.    Toe Pain    Requesting to have a skin tag removed from his chin.   Review of Systems  Constitutional: Negative.   Respiratory: Negative.   Cardiovascular: Negative.   Musculoskeletal: Negative.    Past Medical History  Diagnosis Date  . Diabetes mellitus type II   . Hyperlipidemia   . Hypertension   . Peripheral neuropathy   . Cerebrovascular accident 2003    history of small vessal lacunar stroke 2003  . Other specified cardiac dysrhythmias     PSVT  . Glaucoma     History   Social History  . Marital Status: Married    Spouse Name: N/A    Number of Children: N/A  . Years of Education: N/A   Occupational History  . Not on file.   Social History Main Topics  . Smoking status: Never Smoker   . Smokeless tobacco: Never Used  . Alcohol Use: No  . Drug Use: No  . Sexually Active: Not on file   Other Topics Concern  . Not on file   Social History Narrative   Non smoker On disability    Past Surgical History  Procedure Date  . Anal fissure repair   . Cardiac catheterization 2002  . Cardiac catheterization 2009    No family history on file.  Allergies  Allergen Reactions  . Verapamil     Current Outpatient Prescriptions on File Prior to Visit  Medication Sig Dispense Refill  . aspirin (ASPIRIN EC) 81 MG EC tablet Take 81 mg by mouth daily.        . B-D ULTRAFINE III SHORT PEN 31G X 8 MM MISC use WITH INSULIN four times a day  100 each  1  . digoxin (LANOXIN) 0.25  MG tablet Take 1 tablet (250 mcg total) by mouth daily.  90 tablet  2  . glucose blood (ACCU-CHEK AVIVA) test strip 1 each by Other route 4 (four) times daily -  before meals and at bedtime. Dx 250.00  300 each  3  . hydrochlorothiazide (HYDRODIURIL) 25 MG tablet Take 1 tablet (25 mg total) by mouth daily.  90 tablet  2  . insulin aspart (NOVOLOG FLEXPEN) 100 UNIT/ML injection Inject 40 Units into the skin 3 (three) times daily before meals.  10 pen  3  . insulin glargine (LANTUS SOLOSTAR) 100 UNIT/ML injection Inject 40 Units into the skin at bedtime.  30 mL  1  . Insulin Pen Needle (B-D UF III MINI PEN NEEDLES) 31G X 5 MM MISC Inject 1 each into the skin 4 (four) times daily.  100 each  13  . latanoprost (XALATAN) 0.005 % ophthalmic solution       . lisinopril (PRINIVIL,ZESTRIL) 40 MG tablet Take 1 tablet (40 mg total) by mouth daily.  90 tablet  0  . lovastatin (MEVACOR) 40 MG tablet TAKE 1 TABLET DAILY  90 tablet  3  . metFORMIN (GLUCOPHAGE) 1000 MG tablet Take 1 tablet (  1,000 mg total) by mouth 2 (two) times daily.  180 tablet  0  . timolol (TIMOPTIC-XR) 0.5 % ophthalmic gel-forming       . vardenafil (LEVITRA) 20 MG tablet Take 1 tablet (20 mg total) by mouth as needed for erectile dysfunction.  20 tablet  1  . gabapentin (NEURONTIN) 100 MG capsule Take 1 capsule (100 mg total) by mouth 3 (three) times daily.  90 capsule  2  . hydrochlorothiazide 25 MG tablet Take 1 tablet (25 mg total) by mouth daily.  90 tablet  6    BP 162/80  Temp(Src) 98.6 F (37 C) (Oral)  Wt 235 lb (106.595 kg)chart     Objective:   Physical Exam  Constitutional: He is oriented to person, place, and time. He appears well-developed and well-nourished. No distress.  Cardiovascular: Normal rate, regular rhythm, normal heart sounds and intact distal pulses.  Exam reveals no gallop and no friction rub.   No murmur heard. Pulmonary/Chest: Effort normal and breath sounds normal. No respiratory distress. He has no  wheezes. He has no rales. He exhibits no tenderness.  Neurological: He is alert and oriented to person, place, and time.  Skin: Skin is warm and dry. He is not diaphoretic.       Informed consent was given by the patient for a shave lesion removal. The site was prepped with Betadine and using a 15 blade a 1 cm shave biopsy was obtained.  Hemostasis was achieved with a compression. Wound care was discussed with the patient. Lesion is benign, pathology unnecessary.     Assessment & Plan:  Assessment: Skin tag removal, neuropathy  Plan: Lab: Uric acid, gabapentin. Teaching handouts provided on diagnosis and treatment. Encouraged to RTC if s/s get worse.

## 2011-12-26 NOTE — Patient Instructions (Signed)
Peripheral Neuropathy Peripheral neuropathy is a common disorder of your nerves resulting from damage. CAUSES  This disorder may be caused by a disease of the nerves or illness. Many neuropathies have well known causes such as:  Diabetes. This is one of the most common causes.   Uremia.   AIDS.   Nutritional deficiencies.   Other causes include mechanical pressures. These may be from:   Compression.   Injury.   Contusions or bruises.   Fracture or dislocated bones.   Pressure involving the nerves close to the surface. Nerves such as the ulnar, or radial can be injured by prolonged use of crutches.  Other injuries may come from:  Tumor.   Hemorrhage or bleeding into a nerve.   Exposure to cold or radiation.   Certain medicines or toxic substances (rare).   Vascular or collagen disorders such as:   Atherosclerosis.   Systemic lupus erythematosus.   Scleroderma.   Sarcoidosis.   Rheumatoid arthritis.   Polyarteritis nodosa.   A large number of cases are of unknown cause.  SYMPTOMS  Common problems include:  Weakness.   Numbness.   Abnormal sensations (paresthesia) such as:   Burning.   Tickling.   Pricking.   Tingling.   Pain in the arms, hands, legs and/or feet.  TREATMENT  Therapy for this disorder differs depending on the cause. It may vary from medical treatment with medications or physical therapy among others.   For example, therapy for this disorder caused by diabetes involves control of the diabetes.   In cases where a tumor or ruptured disc is the cause, therapy may involve surgery. This would be to remove the tumor or to repair the ruptured disc.   In entrapment or compression neuropathy, treatment may consist of splinting or surgical decompression of the ulnar or median nerves. A common example of entrapment neuropathy is carpal tunnel syndrome. This has become more common because of the increasing use of computers.   Peroneal and  radial compression neuropathies may require avoidance of pressure.   Physical therapy and/or splints may be useful in preventing contractures. This is a condition in which shortened muscles around joints cause abnormal and sometimes painful positioning of the joints.  Document Released: 09/06/2002 Document Revised: 05/29/2011 Document Reviewed: 09/16/2005 [employer] Patient Information 2012 [employer]. 

## 2011-12-26 NOTE — Progress Notes (Signed)
Addended by: Rita Ohara R on: 12/26/2011 04:41 PM   Modules accepted: Orders

## 2011-12-27 LAB — URIC ACID: Uric Acid, Serum: 9 mg/dL — ABNORMAL HIGH (ref 4.0–7.8)

## 2011-12-31 DIAGNOSIS — H4011X0 Primary open-angle glaucoma, stage unspecified: Secondary | ICD-10-CM | POA: Diagnosis not present

## 2011-12-31 DIAGNOSIS — H409 Unspecified glaucoma: Secondary | ICD-10-CM | POA: Diagnosis not present

## 2011-12-31 DIAGNOSIS — E119 Type 2 diabetes mellitus without complications: Secondary | ICD-10-CM | POA: Diagnosis not present

## 2011-12-31 LAB — HM DIABETES EYE EXAM

## 2012-01-01 ENCOUNTER — Encounter: Payer: Self-pay | Admitting: Internal Medicine

## 2012-01-17 ENCOUNTER — Telehealth: Payer: Self-pay | Admitting: Internal Medicine

## 2012-01-17 MED ORDER — INSULIN PEN NEEDLE 31G X 8 MM MISC: 1.0000 | Freq: Three times a day (TID) | Status: DC

## 2012-01-17 NOTE — Telephone Encounter (Signed)
Pt called req refill of B-D ULTRAFINE III SHORT PEN 31G X 8 MM MISC to [facility] on [address] in [place], [place]

## 2012-01-31 ENCOUNTER — Other Ambulatory Visit: Payer: Self-pay | Admitting: Internal Medicine

## 2012-01-31 MED ORDER — GLIMEPIRIDE 4 MG PO TABS: 4.0000 mg | ORAL_TABLET | Freq: Every day | ORAL | Status: DC

## 2012-01-31 MED ORDER — METFORMIN HCL 1000 MG PO TABS: 1000.0000 mg | ORAL_TABLET | Freq: Two times a day (BID) | ORAL | Status: DC

## 2012-01-31 MED ORDER — LISINOPRIL 40 MG PO TABS: 40.0000 mg | ORAL_TABLET | Freq: Every day | ORAL | Status: DC

## 2012-01-31 NOTE — Telephone Encounter (Signed)
Gave 90 day rx's - must be seen for refills- last saw dr. Amador Cunas 07/2011

## 2012-01-31 NOTE — Telephone Encounter (Signed)
Pt needs new rx Lisinopril 40mg  #90,metformin 1000mg  twice a day #180 and glimepride 4 mg twice a day #180 with 3 refills [facility] fax # [phone].

## 2012-02-04 ENCOUNTER — Other Ambulatory Visit: Payer: Self-pay | Admitting: Internal Medicine

## 2012-03-02 ENCOUNTER — Other Ambulatory Visit: Payer: Self-pay | Admitting: Internal Medicine

## 2012-03-18 ENCOUNTER — Other Ambulatory Visit: Payer: Self-pay | Admitting: Internal Medicine

## 2012-03-26 ENCOUNTER — Ambulatory Visit (INDEPENDENT_AMBULATORY_CARE_PROVIDER_SITE_OTHER): Payer: Medicare Other | Admitting: Internal Medicine

## 2012-03-26 ENCOUNTER — Encounter: Payer: Self-pay | Admitting: Internal Medicine

## 2012-03-26 VITALS — BP 142/84 | HR 70 | Temp 97.9°F | Wt 230.0 lb

## 2012-03-26 DIAGNOSIS — E119 Type 2 diabetes mellitus without complications: Secondary | ICD-10-CM | POA: Diagnosis not present

## 2012-03-26 DIAGNOSIS — I10 Essential (primary) hypertension: Secondary | ICD-10-CM | POA: Diagnosis not present

## 2012-03-26 DIAGNOSIS — G609 Hereditary and idiopathic neuropathy, unspecified: Secondary | ICD-10-CM | POA: Diagnosis not present

## 2012-03-26 DIAGNOSIS — E785 Hyperlipidemia, unspecified: Secondary | ICD-10-CM | POA: Diagnosis not present

## 2012-03-26 LAB — HEMOGLOBIN A1C: Hgb A1c MFr Bld: 7.4 % — ABNORMAL HIGH (ref 4.6–6.5)

## 2012-03-26 MED ORDER — LISINOPRIL 40 MG PO TABS: 40.0000 mg | ORAL_TABLET | Freq: Every day | ORAL | Status: DC

## 2012-03-26 MED ORDER — DIGOXIN 250 MCG PO TABS: 250.0000 ug | ORAL_TABLET | Freq: Every day | ORAL | Status: DC

## 2012-03-26 MED ORDER — INSULIN ASPART 100 UNIT/ML ~~LOC~~ SOLN: SUBCUTANEOUS | Status: DC

## 2012-03-26 MED ORDER — GLUCOSE BLOOD VI STRP: 1.0000 | ORAL_STRIP | Freq: Three times a day (TID) | Status: DC

## 2012-03-26 MED ORDER — LOVASTATIN 40 MG PO TABS: 40.0000 mg | ORAL_TABLET | Freq: Every day | ORAL | Status: DC

## 2012-03-26 MED ORDER — HYDROCHLOROTHIAZIDE 25 MG PO TABS: 25.0000 mg | ORAL_TABLET | Freq: Every day | ORAL | Status: DC

## 2012-03-26 MED ORDER — METFORMIN HCL 1000 MG PO TABS: 1000.0000 mg | ORAL_TABLET | Freq: Two times a day (BID) | ORAL | Status: DC

## 2012-03-26 NOTE — Progress Notes (Signed)
  Subjective:    Patient ID: Jimmy Carr, male    DOB: [birth year], 71 y.o.   MRN: [id number]  HPI  3 -year-old patient who is seen today for followup. He has type 2 diabetes. Remains on metformin and multiple daily insulin injections. This is constant by peripheral neuropathy. He has treated hypertension as well as dyslipidemia. He is doing reasonably well. His last A1c was almost one year ago. No new concerns or complaints.  Wt Readings from Last 3 Encounters:  03/26/12 230 lb (104.327 kg)  12/26/11 235 lb (106.595 kg)  07/24/11 227 lb (102.967 kg)      Review of Systems  Constitutional: Negative for fever, chills, appetite change and fatigue.  HENT: Negative for hearing loss, ear pain, congestion, sore throat, trouble swallowing, neck stiffness, dental problem, voice change and tinnitus.   Eyes: Negative for pain, discharge and visual disturbance.  Respiratory: Negative for cough, chest tightness, wheezing and stridor.   Cardiovascular: Negative for chest pain, palpitations and leg swelling.  Gastrointestinal: Negative for nausea, vomiting, abdominal pain, diarrhea, constipation, blood in stool and abdominal distention.  Genitourinary: Negative for urgency, hematuria, flank pain, discharge, difficulty urinating and genital sores.  Musculoskeletal: Positive for arthralgias. Negative for myalgias, back pain, joint swelling and gait problem.  Skin: Negative for rash.  Neurological: Positive for numbness. Negative for dizziness, syncope, speech difficulty, weakness and headaches.  Hematological: Negative for adenopathy. Does not bruise/bleed easily.  Psychiatric/Behavioral: Negative for behavioral problems and dysphoric mood. The patient is not nervous/anxious.        Objective:   Physical Exam  Constitutional: He is oriented to person, place, and time. He appears well-developed.  HENT:  Head: Normocephalic.  Right Ear: External ear normal.  Left Ear: External ear normal.    Eyes: Conjunctivae and EOM are normal.  Neck: Normal range of motion.  Cardiovascular: Normal rate and normal heart sounds.   Pulmonary/Chest: Breath sounds normal.  Abdominal: Bowel sounds are normal.  Musculoskeletal: Normal range of motion. He exhibits no edema and no tenderness.  Neurological: He is alert and oriented to person, place, and time.  Psychiatric: He has a normal mood and affect. His behavior is normal.          Assessment & Plan:     Diabetes.  We'll discontinue Amaryl and continue multiple daily insulin injections. We'll check a hemoglobin A1c ongoing weight loss encouraged as well as and more regular exercise program Hypertension stable Dyslipidemia  All medications refilled Check hemoglobin A1c Schedule CPX in 3 months

## 2012-03-27 DIAGNOSIS — E119 Type 2 diabetes mellitus without complications: Secondary | ICD-10-CM | POA: Diagnosis not present

## 2012-03-27 DIAGNOSIS — E782 Mixed hyperlipidemia: Secondary | ICD-10-CM | POA: Diagnosis not present

## 2012-03-27 DIAGNOSIS — I10 Essential (primary) hypertension: Secondary | ICD-10-CM | POA: Diagnosis not present

## 2012-03-27 DIAGNOSIS — I4891 Unspecified atrial fibrillation: Secondary | ICD-10-CM | POA: Diagnosis not present

## 2012-03-30 ENCOUNTER — Other Ambulatory Visit: Payer: Self-pay | Admitting: Internal Medicine

## 2012-03-30 MED ORDER — LOVASTATIN 40 MG PO TABS: 40.0000 mg | ORAL_TABLET | Freq: Every day | ORAL | Status: DC

## 2012-03-30 MED ORDER — METFORMIN HCL 1000 MG PO TABS: 1000.0000 mg | ORAL_TABLET | Freq: Two times a day (BID) | ORAL | Status: DC

## 2012-03-30 MED ORDER — HYDROCHLOROTHIAZIDE 25 MG PO TABS: 25.0000 mg | ORAL_TABLET | Freq: Every day | ORAL | Status: DC

## 2012-03-30 MED ORDER — DIGOXIN 250 MCG PO TABS: 250.0000 ug | ORAL_TABLET | Freq: Every day | ORAL | Status: DC

## 2012-03-30 MED ORDER — GLUCOSE BLOOD VI STRP: 1.0000 | ORAL_STRIP | Freq: Three times a day (TID) | Status: DC

## 2012-03-30 MED ORDER — LISINOPRIL 40 MG PO TABS: 40.0000 mg | ORAL_TABLET | Freq: Every day | ORAL | Status: DC

## 2012-03-30 NOTE — Telephone Encounter (Signed)
Pt requesting the following medication be re-sent to [facility]  metFORMIN (GLUCOPHAGE) 1000 MG tablet  lovastatin (MEVACOR) 40 MG tablet    lisinopril (PRINIVIL,ZESTRIL) 40 MG tablet   hydrochlorothiazide (HYDRODIURIL) 25 MG tablet glucose blood (ACCU-CHEK AVIVA) test strip   digoxin (LANOXIN) 0.25 MG tablet

## 2012-03-30 NOTE — Telephone Encounter (Signed)
All faxed to [facility]

## 2012-04-24 ENCOUNTER — Other Ambulatory Visit: Payer: Self-pay | Admitting: Internal Medicine

## 2012-05-20 ENCOUNTER — Other Ambulatory Visit: Payer: Self-pay | Admitting: Internal Medicine

## 2012-06-12 ENCOUNTER — Ambulatory Visit (INDEPENDENT_AMBULATORY_CARE_PROVIDER_SITE_OTHER): Payer: Medicare Other | Admitting: Internal Medicine

## 2012-06-12 ENCOUNTER — Encounter: Payer: Self-pay | Admitting: Internal Medicine

## 2012-06-12 VITALS — BP 112/70 | Temp 98.3°F | Wt 231.0 lb

## 2012-06-12 DIAGNOSIS — E119 Type 2 diabetes mellitus without complications: Secondary | ICD-10-CM

## 2012-06-12 DIAGNOSIS — E785 Hyperlipidemia, unspecified: Secondary | ICD-10-CM | POA: Diagnosis not present

## 2012-06-12 DIAGNOSIS — G609 Hereditary and idiopathic neuropathy, unspecified: Secondary | ICD-10-CM

## 2012-06-12 DIAGNOSIS — I10 Essential (primary) hypertension: Secondary | ICD-10-CM

## 2012-06-12 DIAGNOSIS — R109 Unspecified abdominal pain: Secondary | ICD-10-CM

## 2012-06-12 MED ORDER — GLIMEPIRIDE 4 MG PO TABS: 4.0000 mg | ORAL_TABLET | Freq: Every day | ORAL | Status: DC

## 2012-06-12 NOTE — Progress Notes (Signed)
Subjective:    Patient ID: Jimmy Carr, male    DOB: [birth year], 71 y.o.   MRN: [id number]  HPI  71 year old patient who is seen today with a chief complaint of right mid abdominal and flank discomfort. This has been present for about one month. He states that he has had this intermittently in the past pain is aggravated by prolonged sitting and twisting and stooping. Pain is also aggravated by raising his right hand over his head. He has a history of diabetes. Glimepiride has been discontinued in the past but he again has resumed the taking 4 mg at approximately 10 PM. He states when he does this fasting blood sugars are generally 30 points lower in the morning he is also on Lantus insulin 40 units at bedtime. He remains on NovoLog 40 units prior to each meal which is usually twice a day and occasionally 3 times daily. No hypoglycemia. He has dyslipidemia peripheral neuropathy and treated hypertension. He has remote history of cerebrovascular disease  Past Medical History  Diagnosis Date  . Diabetes mellitus type II   . Hyperlipidemia   . Hypertension   . Peripheral neuropathy   . Cerebrovascular accident 2003    history of small vessal lacunar stroke 2003  . Other specified cardiac dysrhythmias     PSVT  . Glaucoma     History   Social History  . Marital Status: Married    Spouse Name: N/A    Number of Children: N/A  . Years of Education: N/A   Occupational History  . Not on file.   Social History Main Topics  . Smoking status: Never Smoker   . Smokeless tobacco: Never Used  . Alcohol Use: No  . Drug Use: No  . Sexually Active: Not on file   Other Topics Concern  . Not on file   Social History Narrative   Non smoker On disability    Past Surgical History  Procedure Date  . Anal fissure repair   . Cardiac catheterization 2002  . Cardiac catheterization 2009    No family history on file.  Allergies  Allergen Reactions  . Verapamil     Current  Outpatient Prescriptions on File Prior to Visit  Medication Sig Dispense Refill  . aspirin (ASPIRIN EC) 81 MG EC tablet Take 81 mg by mouth daily.        . B-D ULTRAFINE III SHORT PEN 31G X 8 MM MISC use to inject four times a day with meals and at bedtime  100 each  3  . digoxin (LANOXIN) 0.25 MG tablet Take 1 tablet (250 mcg total) by mouth daily.  90 tablet  3  . glucose blood (ACCU-CHEK AVIVA) test strip 1 each by Other route 4 (four) times daily -  before meals and at bedtime. Dx 250.00  300 each  3  . hydrochlorothiazide (HYDRODIURIL) 25 MG tablet Take 1 tablet (25 mg total) by mouth daily.  90 tablet  3  . insulin aspart (NOVOLOG FLEXPEN) 100 UNIT/ML injection 40 units prior to each meal  3 pen  5  . LANTUS SOLOSTAR 100 UNIT/ML injection INJECT 40 UNITS INTO THE SKIN AT BEDTIME  30 Syringe  2  . latanoprost (XALATAN) 0.005 % ophthalmic solution Place 1 drop into both eyes daily.       Marland Kitchen lisinopril (PRINIVIL,ZESTRIL) 40 MG tablet Take 1 tablet (40 mg total) by mouth daily.  90 tablet  3  . lovastatin (MEVACOR) 40 MG tablet Take 1  tablet (40 mg total) by mouth at bedtime.  90 tablet  3  . metFORMIN (GLUCOPHAGE) 1000 MG tablet Take 1 tablet (1,000 mg total) by mouth 2 (two) times daily.  180 tablet  3  . timolol (TIMOPTIC-XR) 0.5 % ophthalmic gel-forming Place 1 drop into both eyes daily.       Marland Kitchen gabapentin (NEURONTIN) 100 MG capsule Take 1 capsule (100 mg total) by mouth 3 (three) times daily.  90 capsule  2    BP 112/70  Temp 98.3 F (36.8 C) (Oral)  Wt 231 lb (104.781 kg)       Review of Systems  Constitutional: Negative for fever, chills, appetite change and fatigue.  HENT: Negative for hearing loss, ear pain, congestion, sore throat, trouble swallowing, neck stiffness, dental problem, voice change and tinnitus.   Eyes: Negative for pain, discharge and visual disturbance.  Respiratory: Negative for cough, chest tightness, wheezing and stridor.   Cardiovascular: Negative for  chest pain, palpitations and leg swelling.  Gastrointestinal: Negative for nausea, vomiting, abdominal pain, diarrhea, constipation, blood in stool and abdominal distention.  Genitourinary: Negative for urgency, hematuria, flank pain, discharge, difficulty urinating and genital sores.  Musculoskeletal: Negative for myalgias, back pain, joint swelling, arthralgias and gait problem.  Skin: Negative for rash.  Neurological: Negative for dizziness, syncope, speech difficulty, weakness, numbness and headaches.  Hematological: Negative for adenopathy. Does not bruise/bleed easily.  Psychiatric/Behavioral: Negative for behavioral problems and dysphoric mood. The patient is not nervous/anxious.        Objective:   Physical Exam  Constitutional: He is oriented to person, place, and time. He appears well-developed.  HENT:  Head: Normocephalic.  Right Ear: External ear normal.  Left Ear: External ear normal.       Decreased auditory acuity Canals clear  Eyes: Conjunctivae normal and EOM are normal.  Neck: Normal range of motion.  Cardiovascular: Normal rate and normal heart sounds.   Pulmonary/Chest: Breath sounds normal.  Abdominal: Soft. Bowel sounds are normal. He exhibits no distension. There is no tenderness. There is no rebound.  Musculoskeletal: Normal range of motion. He exhibits no edema and no tenderness.  Neurological: He is alert and oriented to person, place, and time.  Psychiatric: He has a normal mood and affect. His behavior is normal.          Assessment & Plan:   Abdominal pain. Appears to be abdominal wall musculature. We'll recommend stretching exercises and observe. We'll schedule CPX Diabetes mellitus. Will continue Amaryl. This has been discontinued at least 2 prior occasions. He is aware of the increased risk of hypoglycemia but does seem to maintain better glycemic control. 2 increase Lantus for equipment Glycemic control, risk of hypoglycemia would probably be  equivalent. He wishes to continue on this medication Hypertension stable Dyslipidemia. We'll check lipid profile next month

## 2012-06-12 NOTE — Patient Instructions (Signed)
Limit your sodium (Salt) intake   Please check your hemoglobin A1c every 3 months    It is important that you exercise regularly, at least 20 minutes 3 to 4 times per week.  If you develop chest pain or shortness of breath seek  medical attention.  You need to lose weight.  Consider a lower calorie diet and regular exercise. 

## 2012-06-26 ENCOUNTER — Telehealth: Payer: Self-pay | Admitting: Internal Medicine

## 2012-06-26 DIAGNOSIS — Z794 Long term (current) use of insulin: Secondary | ICD-10-CM | POA: Diagnosis not present

## 2012-06-26 DIAGNOSIS — I499 Cardiac arrhythmia, unspecified: Secondary | ICD-10-CM | POA: Diagnosis not present

## 2012-06-26 DIAGNOSIS — H409 Unspecified glaucoma: Secondary | ICD-10-CM | POA: Diagnosis not present

## 2012-06-26 DIAGNOSIS — R109 Unspecified abdominal pain: Secondary | ICD-10-CM | POA: Diagnosis not present

## 2012-06-26 DIAGNOSIS — N200 Calculus of kidney: Secondary | ICD-10-CM | POA: Diagnosis not present

## 2012-06-26 DIAGNOSIS — E119 Type 2 diabetes mellitus without complications: Secondary | ICD-10-CM | POA: Diagnosis not present

## 2012-06-26 DIAGNOSIS — I10 Essential (primary) hypertension: Secondary | ICD-10-CM | POA: Diagnosis not present

## 2012-06-26 DIAGNOSIS — N201 Calculus of ureter: Secondary | ICD-10-CM | POA: Diagnosis not present

## 2012-06-26 DIAGNOSIS — Z79899 Other long term (current) drug therapy: Secondary | ICD-10-CM | POA: Diagnosis not present

## 2012-06-26 DIAGNOSIS — K5730 Diverticulosis of large intestine without perforation or abscess without bleeding: Secondary | ICD-10-CM | POA: Diagnosis not present

## 2012-06-26 DIAGNOSIS — Z7982 Long term (current) use of aspirin: Secondary | ICD-10-CM | POA: Diagnosis not present

## 2012-06-26 DIAGNOSIS — Z8673 Personal history of transient ischemic attack (TIA), and cerebral infarction without residual deficits: Secondary | ICD-10-CM | POA: Diagnosis not present

## 2012-06-26 NOTE — Telephone Encounter (Signed)
Patient calling, was seen last week with right side pain.  It has worsened and is now just under his right rib cage.  He has noticed the pain worsens just after eating.  The pain is constant all the time. No fever.    Triaged per Abdominal Pain.  There are no appts left at the office.  Due to his diabetes, patient is going to ED now to be seen and evaluated.

## 2012-06-26 NOTE — Telephone Encounter (Signed)
Noted  

## 2012-07-06 ENCOUNTER — Telehealth: Payer: Self-pay | Admitting: Internal Medicine

## 2012-07-06 MED ORDER — GLUCOSE BLOOD VI STRP: 1.0000 | ORAL_STRIP | Freq: Three times a day (TID) | Status: DC

## 2012-07-06 NOTE — Telephone Encounter (Signed)
Pt called req to get script for Freestyle Lite Test Strips. Pls call in to [facility] on [address] in [place], [place]

## 2012-09-02 ENCOUNTER — Ambulatory Visit (INDEPENDENT_AMBULATORY_CARE_PROVIDER_SITE_OTHER): Payer: Medicare Other | Admitting: Internal Medicine

## 2012-09-02 ENCOUNTER — Encounter: Payer: Self-pay | Admitting: Internal Medicine

## 2012-09-02 VITALS — BP 120/72 | HR 76 | Temp 97.8°F | Resp 18 | Wt 231.0 lb

## 2012-09-02 DIAGNOSIS — I10 Essential (primary) hypertension: Secondary | ICD-10-CM

## 2012-09-02 DIAGNOSIS — J069 Acute upper respiratory infection, unspecified: Secondary | ICD-10-CM | POA: Diagnosis not present

## 2012-09-02 DIAGNOSIS — E119 Type 2 diabetes mellitus without complications: Secondary | ICD-10-CM

## 2012-09-02 DIAGNOSIS — E785 Hyperlipidemia, unspecified: Secondary | ICD-10-CM

## 2012-09-02 MED ORDER — INSULIN LISPRO 100 UNIT/ML ~~LOC~~ SOLN: 40.0000 [IU] | Freq: Three times a day (TID) | SUBCUTANEOUS | Status: DC

## 2012-09-02 MED ORDER — GLUCOSE BLOOD VI STRP: 1.0000 | ORAL_STRIP | Freq: Three times a day (TID) | Status: DC

## 2012-09-02 MED ORDER — INSULIN GLARGINE 100 UNIT/ML ~~LOC~~ SOLN: SUBCUTANEOUS | Status: DC

## 2012-09-02 MED ORDER — INSULIN PEN NEEDLE 31G X 8 MM MISC: Status: DC

## 2012-09-02 NOTE — Progress Notes (Signed)
Subjective:    Patient ID: Jimmy Carr, male    DOB: Jul 15, 1941, 71 y.o.   MRN: [id number]  HPI  71 year old patient who has type 2 diabetes. His basal and mealtime insulin. He remains on Amaryl which he feels add some additional glycemic control. He has some mild residual cough from a recent URI. He has cerebrovascular disease which has been stable. He is treated dyslipidemia and hypertension. No recent hemoglobin A 1C. He requires medication refills  Past Medical History  Diagnosis Date  . Diabetes mellitus type II   . Hyperlipidemia   . Hypertension   . Peripheral neuropathy   . Cerebrovascular accident 2003    history of small vessal lacunar stroke 2003  . Other specified cardiac dysrhythmias     PSVT  . Glaucoma(365)     History   Social History  . Marital Status: Married    Spouse Name: N/A    Number of Children: N/A  . Years of Education: N/A   Occupational History  . Not on file.   Social History Main Topics  . Smoking status: Never Smoker   . Smokeless tobacco: Never Used  . Alcohol Use: No  . Drug Use: No  . Sexually Active: Not on file   Other Topics Concern  . Not on file   Social History Narrative   Non smoker On disability    Past Surgical History  Procedure Date  . Anal fissure repair   . Cardiac catheterization 2002  . Cardiac catheterization 2009    No family history on file.  Allergies  Allergen Reactions  . Verapamil     Current Outpatient Prescriptions on File Prior to Visit  Medication Sig Dispense Refill  . aspirin (ASPIRIN EC) 81 MG EC tablet Take 81 mg by mouth daily.        . B-D ULTRAFINE III SHORT PEN 31G X 8 MM MISC use to inject four times a day with meals and at bedtime  100 each  3  . digoxin (LANOXIN) 0.25 MG tablet Take 1 tablet (250 mcg total) by mouth daily.  90 tablet  3  . gabapentin (NEURONTIN) 100 MG capsule Take 1 capsule (100 mg total) by mouth 3 (three) times daily.  90 capsule  2  . glimepiride (AMARYL) 4  MG tablet Take 1 tablet (4 mg total) by mouth daily before breakfast.  180 tablet  3  . glucose blood (FREESTYLE TEST STRIPS) test strip 1 each by Other route 4 (four) times daily -  before meals and at bedtime. Use as instructed  100 each  12  . hydrochlorothiazide (HYDRODIURIL) 25 MG tablet Take 1 tablet (25 mg total) by mouth daily.  90 tablet  3  . insulin aspart (NOVOLOG FLEXPEN) 100 UNIT/ML injection 40 units prior to each meal  3 pen  5  . LANTUS SOLOSTAR 100 UNIT/ML injection INJECT 40 UNITS INTO THE SKIN AT BEDTIME  30 Syringe  2  . latanoprost (XALATAN) 0.005 % ophthalmic solution Place 1 drop into both eyes daily.       Marland Kitchen lisinopril (PRINIVIL,ZESTRIL) 40 MG tablet Take 1 tablet (40 mg total) by mouth daily.  90 tablet  3  . lovastatin (MEVACOR) 40 MG tablet Take 1 tablet (40 mg total) by mouth at bedtime.  90 tablet  3  . metFORMIN (GLUCOPHAGE) 1000 MG tablet Take 1 tablet (1,000 mg total) by mouth 2 (two) times daily.  180 tablet  3  . timolol (TIMOPTIC-XR) 0.5 %  ophthalmic gel-forming Place 1 drop into both eyes daily.         BP 120/72  Pulse 76  Temp 97.8 F (36.6 C) (Oral)  Resp 18  Wt 231 lb (104.781 kg)  SpO2 97%       Review of Systems  Constitutional: Negative for fever, chills, appetite change and fatigue.  HENT: Negative for hearing loss, ear pain, congestion, sore throat, trouble swallowing, neck stiffness, dental problem, voice change and tinnitus.   Eyes: Negative for pain, discharge and visual disturbance.  Respiratory: Positive for cough. Negative for chest tightness, wheezing and stridor.   Cardiovascular: Negative for chest pain, palpitations and leg swelling.  Gastrointestinal: Negative for nausea, vomiting, abdominal pain, diarrhea, constipation, blood in stool and abdominal distention.  Genitourinary: Negative for urgency, hematuria, flank pain, discharge, difficulty urinating and genital sores.  Musculoskeletal: Negative for myalgias, back pain, joint  swelling, arthralgias and gait problem.  Skin: Negative for rash.  Neurological: Negative for dizziness, syncope, speech difficulty, weakness, numbness and headaches.  Hematological: Negative for adenopathy. Does not bruise/bleed easily.  Psychiatric/Behavioral: Negative for behavioral problems and dysphoric mood. The patient is not nervous/anxious.        Objective:   Physical Exam  Constitutional: He is oriented to person, place, and time. He appears well-developed.  HENT:  Head: Normocephalic.  Right Ear: External ear normal.  Left Ear: External ear normal.  Eyes: Conjunctivae normal and EOM are normal.  Neck: Normal range of motion.  Cardiovascular: Normal rate and normal heart sounds.   Pulmonary/Chest: Breath sounds normal.  Abdominal: Bowel sounds are normal.  Musculoskeletal: Normal range of motion. He exhibits no edema and no tenderness.  Neurological: He is alert and oriented to person, place, and time.  Psychiatric: He has a normal mood and affect. His behavior is normal.          Assessment & Plan:   Resolving URI Diabetes mellitus. Will check a hemoglobin A 1C. Weight loss encouraged Hypertension controlled Dyslipidemia. Continue lovastatin  Recheck 3 months

## 2012-09-02 NOTE — Patient Instructions (Signed)
Please check your hemoglobin A1c every 3 months    It is important that you exercise regularly, at least 20 minutes 3 to 4 times per week.  If you develop chest pain or shortness of breath seek  medical attention.  Please check your blood pressure on a regular basis.  If it is consistently greater than 150/90, please make an office appointment.   Limit your sodium (Salt) intake

## 2012-09-07 ENCOUNTER — Telehealth: Payer: Self-pay | Admitting: Internal Medicine

## 2012-09-07 MED ORDER — INSULIN LISPRO 100 UNIT/ML ~~LOC~~ SOLN: 40.0000 [IU] | Freq: Three times a day (TID) | SUBCUTANEOUS | Status: DC

## 2012-09-07 NOTE — Telephone Encounter (Signed)
Pharm called and states that the humalog insulin called in was in VIAL form. Pt has never used VIAL form. He wants the PENS, like he had with the Novolog. Please resend in PEN form. Thanks.

## 2012-09-07 NOTE — Telephone Encounter (Signed)
Pt notified Rx refill for Humalog Pens was sent to pharmacy. Pt verbalized understanding.

## 2012-09-11 ENCOUNTER — Encounter: Payer: Medicare Other | Admitting: Internal Medicine

## 2012-09-13 DIAGNOSIS — E119 Type 2 diabetes mellitus without complications: Secondary | ICD-10-CM | POA: Diagnosis not present

## 2012-09-13 DIAGNOSIS — Z79899 Other long term (current) drug therapy: Secondary | ICD-10-CM | POA: Diagnosis not present

## 2012-09-13 DIAGNOSIS — Z794 Long term (current) use of insulin: Secondary | ICD-10-CM | POA: Diagnosis not present

## 2012-09-13 DIAGNOSIS — S99919A Unspecified injury of unspecified ankle, initial encounter: Secondary | ICD-10-CM | POA: Diagnosis not present

## 2012-09-13 DIAGNOSIS — Z7982 Long term (current) use of aspirin: Secondary | ICD-10-CM | POA: Diagnosis not present

## 2012-09-13 DIAGNOSIS — I10 Essential (primary) hypertension: Secondary | ICD-10-CM | POA: Diagnosis not present

## 2012-09-13 DIAGNOSIS — IMO0002 Reserved for concepts with insufficient information to code with codable children: Secondary | ICD-10-CM | POA: Diagnosis not present

## 2012-09-13 DIAGNOSIS — S8990XA Unspecified injury of unspecified lower leg, initial encounter: Secondary | ICD-10-CM | POA: Diagnosis not present

## 2012-09-13 DIAGNOSIS — Z888 Allergy status to other drugs, medicaments and biological substances status: Secondary | ICD-10-CM | POA: Diagnosis not present

## 2012-09-13 DIAGNOSIS — E7800 Pure hypercholesterolemia, unspecified: Secondary | ICD-10-CM | POA: Diagnosis not present

## 2012-09-13 DIAGNOSIS — S99929A Unspecified injury of unspecified foot, initial encounter: Secondary | ICD-10-CM | POA: Diagnosis not present

## 2012-09-13 DIAGNOSIS — M25569 Pain in unspecified knee: Secondary | ICD-10-CM | POA: Diagnosis not present

## 2012-09-13 DIAGNOSIS — G589 Mononeuropathy, unspecified: Secondary | ICD-10-CM | POA: Diagnosis not present

## 2012-10-21 ENCOUNTER — Telehealth: Payer: Self-pay | Admitting: Internal Medicine

## 2012-10-21 NOTE — Telephone Encounter (Signed)
Pls contact [place] with [employer] [phone]. Thank you.

## 2012-10-21 NOTE — Telephone Encounter (Signed)
[employer] needs info for your request of  Digoxin. They need additional information on diagnosis and any previous medicines tried before this med.  Pls call within 48 hrs or send form back form within 48 hrs. Form was sent Friday, Jan 17.2014

## 2012-10-22 NOTE — Telephone Encounter (Signed)
Spoke w/insurance. Tier exception is in progress. Pt aware that [facility] will notify him of their decision via phone call.

## 2012-10-27 DIAGNOSIS — H409 Unspecified glaucoma: Secondary | ICD-10-CM | POA: Diagnosis not present

## 2012-10-27 DIAGNOSIS — H4011X0 Primary open-angle glaucoma, stage unspecified: Secondary | ICD-10-CM | POA: Diagnosis not present

## 2012-11-20 ENCOUNTER — Ambulatory Visit (INDEPENDENT_AMBULATORY_CARE_PROVIDER_SITE_OTHER): Payer: Medicare Other | Admitting: Internal Medicine

## 2012-11-20 ENCOUNTER — Encounter: Payer: Self-pay | Admitting: Internal Medicine

## 2012-11-20 VITALS — BP 156/90 | HR 77 | Temp 97.6°F | Resp 18 | Ht 71.0 in | Wt 233.0 lb

## 2012-11-20 DIAGNOSIS — G609 Hereditary and idiopathic neuropathy, unspecified: Secondary | ICD-10-CM

## 2012-11-20 DIAGNOSIS — Z0000 Encounter for general adult medical examination without abnormal findings: Secondary | ICD-10-CM

## 2012-11-20 DIAGNOSIS — E785 Hyperlipidemia, unspecified: Secondary | ICD-10-CM | POA: Diagnosis not present

## 2012-11-20 DIAGNOSIS — I10 Essential (primary) hypertension: Secondary | ICD-10-CM

## 2012-11-20 DIAGNOSIS — Z8679 Personal history of other diseases of the circulatory system: Secondary | ICD-10-CM

## 2012-11-20 DIAGNOSIS — E119 Type 2 diabetes mellitus without complications: Secondary | ICD-10-CM

## 2012-11-20 LAB — COMPREHENSIVE METABOLIC PANEL
AST: 24 U/L (ref 0–37)
Albumin: 4 g/dL (ref 3.5–5.2)
BUN: 18 mg/dL (ref 6–23)
Calcium: 9.2 mg/dL (ref 8.4–10.5)
Chloride: 101 mEq/L (ref 96–112)
Creatinine, Ser: 1.3 mg/dL (ref 0.4–1.5)
Glucose, Bld: 147 mg/dL — ABNORMAL HIGH (ref 70–99)
Potassium: 4 mEq/L (ref 3.5–5.1)

## 2012-11-20 LAB — CBC WITH DIFFERENTIAL/PLATELET
Basophils Relative: 0.4 % (ref 0.0–3.0)
Eosinophils Relative: 5.1 % — ABNORMAL HIGH (ref 0.0–5.0)
Hemoglobin: 16.1 g/dL (ref 13.0–17.0)
Lymphocytes Relative: 29.8 % (ref 12.0–46.0)
Monocytes Relative: 10.4 % (ref 3.0–12.0)
Neutro Abs: 4.4 10*3/uL (ref 1.4–7.7)
Neutrophils Relative %: 54.3 % (ref 43.0–77.0)
RBC: 5.86 Mil/uL — ABNORMAL HIGH (ref 4.22–5.81)
WBC: 8.1 10*3/uL (ref 4.5–10.5)

## 2012-11-20 LAB — LIPID PANEL
Cholesterol: 135 mg/dL (ref 0–200)
HDL: 30.9 mg/dL — ABNORMAL LOW (ref >39.00)
VLDL: 49.8 mg/dL — ABNORMAL HIGH (ref 0.0–40.0)

## 2012-11-20 LAB — TSH: TSH: 0.81 u[IU]/mL (ref 0.35–5.50)

## 2012-11-20 LAB — MICROALBUMIN / CREATININE URINE RATIO
Creatinine,U: 181.2 mg/dL
Microalb Creat Ratio: 2.9 mg/g (ref 0.0–30.0)

## 2012-11-20 LAB — LDL CHOLESTEROL, DIRECT: Direct LDL: 58 mg/dL

## 2012-11-20 NOTE — Patient Instructions (Signed)
Please check your hemoglobin A1c every 3 months  You need to lose weight.  Consider a lower calorie diet and regular exercise.    It is important that you exercise regularly, at least 20 minutes 3 to 4 times per week.  If you develop chest pain or shortness of breath seek  medical attention. 

## 2012-11-20 NOTE — Progress Notes (Signed)
Subjective:    Patient ID: Jimmy Carr, male    DOB: 05-Oct-1940, 72 y.o.   MRN: [id number]  HPI  72 year old patient who is seen today for a wellness exam. Medical problems include diabetes hypertension dyslipidemia cerebrovascular disease. He has a history of a peripheral neuropathy.   Current Allergies:  ! VERAPAMIL   Past Medical History:  Diabetes mellitus, type II  Hyperlipidemia  Hypertension  Peripheral neuropathy  Cerebrovascular accident, hx of small vessel lacunar stroke 2003  Cardiac dysrhythmias; PSVT  glaucoma   Past Surgical History:  Reviewed history from 02/24/2007 and no changes required:  Anal fissure  cardiac catheterization 2002  colonoscopy December 2007  carotid duplex evaluation 2003  Cardiolite stress test 2007   Family History:  Reviewed history from 11/03/2007 and no changes required:  details of his father's health unknown  mother died in her mid 43s; history of cervical cancer, died of complications of pneumonia   Social History:  Married  Never Smoked  Risk Factors:  Tobacco use: never   Past Medical History  Diagnosis Date  . Diabetes mellitus type II   . Hyperlipidemia   . Hypertension   . Peripheral neuropathy   . Cerebrovascular accident 2003    history of small vessal lacunar stroke 2003  . Other specified cardiac dysrhythmias     PSVT  . Glaucoma(365)     History   Social History  . Marital Status: Married    Spouse Name: N/A    Number of Children: N/A  . Years of Education: N/A   Occupational History  . Not on file.   Social History Main Topics  . Smoking status: Never Smoker   . Smokeless tobacco: Never Used  . Alcohol Use: No  . Drug Use: No  . Sexually Active: Not on file   Other Topics Concern  . Not on file   Social History Narrative   Non smoker    On disability    Past Surgical History  Procedure Laterality Date  . Anal fissure repair    . Cardiac catheterization  2002  . Cardiac  catheterization  2009    No family history on file.  Allergies  Allergen Reactions  . Verapamil     Current Outpatient Prescriptions on File Prior to Visit  Medication Sig Dispense Refill  . aspirin (ASPIRIN EC) 81 MG EC tablet Take 81 mg by mouth daily.        . digoxin (LANOXIN) 0.25 MG tablet Take 1 tablet (250 mcg total) by mouth daily.  90 tablet  3  . glimepiride (AMARYL) 4 MG tablet Take 1 tablet (4 mg total) by mouth daily before breakfast.  180 tablet  3  . glucose blood (FREESTYLE TEST STRIPS) test strip 1 each by Other route 4 (four) times daily -  before meals and at bedtime. Use as instructed  100 each  12  . hydrochlorothiazide (HYDRODIURIL) 25 MG tablet Take 1 tablet (25 mg total) by mouth daily.  90 tablet  3  . insulin glargine (LANTUS SOLOSTAR) 100 UNIT/ML injection 40 units at bedtime  3 pen  6  . insulin lispro (HUMALOG KWIKPEN) 100 UNIT/ML injection Inject 40 Units into the skin 3 (three) times daily before meals.  3 mL  12  . Insulin Pen Needle (B-D ULTRAFINE III SHORT PEN) 31G X 8 MM MISC Use 4 times daily before meals and at bedtime  100 each  6  . latanoprost (XALATAN) 0.005 % ophthalmic solution Place  1 drop into both eyes daily.       Marland Kitchen lisinopril (PRINIVIL,ZESTRIL) 40 MG tablet Take 1 tablet (40 mg total) by mouth daily.  90 tablet  3  . lovastatin (MEVACOR) 40 MG tablet Take 1 tablet (40 mg total) by mouth at bedtime.  90 tablet  3  . metFORMIN (GLUCOPHAGE) 1000 MG tablet Take 1 tablet (1,000 mg total) by mouth 2 (two) times daily.  180 tablet  3  . timolol (TIMOPTIC-XR) 0.5 % ophthalmic gel-forming Place 1 drop into both eyes daily.        No current facility-administered medications on file prior to visit.    BP 156/90  Pulse 77  Temp(Src) 97.6 F (36.4 C) (Oral)  Resp 18  Ht 5\' 11"  (1.803 m)  Wt 233 lb (105.688 kg)  BMI 32.51 kg/m2  SpO2 97%   1. Risk factors, based on past  M,S,F history- cardiovascular as factors include hypertension diabetes  dyslipidemia. Also has a history of cerebrovascular disease  2.  Physical activities: Remains active. No activity restrictions  3.  Depression/mood: No history depression or mood disorder  4.  Hearing: No deficits  5.  ADL's: Independent aspects of daily living  6.  Fall risk: Low  7.  Home safety: No problems identified  8.  Height weight, and visual acuity; height and weight stable no change in visual acuity. He is followed by ophthalmology coronary  9.  Counseling: Regular exercise heart healthy diet all encouraged 10. Lab orders based on risk factors: Laboratory profile including lipid panel will be reviewed  11. Referral : Followup ophthalmology  12. Care plan: Modest weight loss regular exercise encouraged  13. Cognitive assessment: Alert and oriented with normal affect. No cognitive dysfunction     Review of Systems  Constitutional: Negative for fever, chills, appetite change and fatigue.  HENT: Negative for hearing loss, ear pain, congestion, sore throat, trouble swallowing, neck stiffness, dental problem, voice change and tinnitus.   Eyes: Negative for pain, discharge and visual disturbance.  Respiratory: Negative for cough, chest tightness, wheezing and stridor.   Cardiovascular: Negative for chest pain, palpitations and leg swelling.  Gastrointestinal: Negative for nausea, vomiting, abdominal pain, diarrhea, constipation, blood in stool and abdominal distention.  Genitourinary: Negative for urgency, hematuria, flank pain, discharge, difficulty urinating and genital sores.  Musculoskeletal: Negative for myalgias, back pain, joint swelling, arthralgias and gait problem.  Skin: Negative for rash.  Neurological: Positive for numbness. Negative for dizziness, syncope, speech difficulty, weakness and headaches.  Hematological: Negative for adenopathy. Does not bruise/bleed easily.  Psychiatric/Behavioral: Negative for behavioral problems and dysphoric mood. The patient is  not nervous/anxious.        Objective:   Physical Exam  Constitutional: He appears well-developed and well-nourished.  HENT:  Head: Normocephalic and atraumatic.  Right Ear: External ear normal.  Left Ear: External ear normal.  Nose: Nose normal.  Mouth/Throat: Oropharynx is clear and moist.  Eyes: Conjunctivae and EOM are normal. Pupils are equal, round, and reactive to light. No scleral icterus.  Neck: Normal range of motion. Neck supple. No JVD present. No thyromegaly present.  Cardiovascular: Regular rhythm, normal heart sounds and intact distal pulses.  Exam reveals no gallop and no friction rub.   No murmur heard. Pulmonary/Chest: Effort normal and breath sounds normal. He exhibits no tenderness.  Abdominal: Soft. Bowel sounds are normal. He exhibits no distension and no mass. There is no tenderness.  Genitourinary: Penis normal.  Prostate +2 enlarged smooth and symmetrical  Musculoskeletal: Normal range of motion. He exhibits no edema and no tenderness.  Lymphadenopathy:    He has no cervical adenopathy.  Neurological: He is alert. He has normal reflexes. No cranial nerve deficit. Coordination normal.  Skin: Skin is warm and dry. No rash noted.  Psychiatric: He has a normal mood and affect. His behavior is normal.          Assessment & Plan:   Preventive health exam Diabetes mellitus. Better diet exercise regimen encouraged last hemoglobin A1c 8. The patient is on a regimen of Lantus insulin 40 units at bedtime as well as Humalog 40 units prior to each meal 2 or 3 times daily fasting and pre-meal blood sugars are well-controlled Hypertension stable repeat blood pressure 130/80. No medications today dyslipidemia. We'll check a lipid profile

## 2013-03-12 ENCOUNTER — Encounter: Payer: Self-pay | Admitting: Internal Medicine

## 2013-03-16 ENCOUNTER — Encounter: Payer: Self-pay | Admitting: Internal Medicine

## 2013-03-16 ENCOUNTER — Ambulatory Visit (INDEPENDENT_AMBULATORY_CARE_PROVIDER_SITE_OTHER): Payer: Medicare Other | Admitting: Internal Medicine

## 2013-03-16 VITALS — BP 136/78 | HR 78 | Ht 72.0 in | Wt 230.1 lb

## 2013-03-16 DIAGNOSIS — I4891 Unspecified atrial fibrillation: Secondary | ICD-10-CM | POA: Diagnosis not present

## 2013-03-16 DIAGNOSIS — I499 Cardiac arrhythmia, unspecified: Secondary | ICD-10-CM | POA: Diagnosis not present

## 2013-03-16 DIAGNOSIS — I10 Essential (primary) hypertension: Secondary | ICD-10-CM | POA: Diagnosis not present

## 2013-03-16 DIAGNOSIS — Z8679 Personal history of other diseases of the circulatory system: Secondary | ICD-10-CM

## 2013-03-16 DIAGNOSIS — I480 Paroxysmal atrial fibrillation: Secondary | ICD-10-CM | POA: Insufficient documentation

## 2013-03-16 DIAGNOSIS — E785 Hyperlipidemia, unspecified: Secondary | ICD-10-CM

## 2013-03-16 NOTE — Progress Notes (Signed)
OFFICE NOTE  Chief Complaint:  Yearly office visit  Primary Care Physician: Jimmy Boga, MD  HPI:  Jimmy Carr is a 72 year-old male with a history of PAF. His atrial fib actually started in the mid 80s. He was on Coumadin, beta blockers, and Lanoxin, and over the years, we have been able to wean him off his beta blockers. He is still on a low dose of Lanoxin and several years ago, we stopped the Coumadin, and his CHADS score at that time was 2, but with no atrial fibrillation in over 10 years, we stopped the Coumadin. He is on aspirin. He has had no recurrence of any atrial fib in over 15 years. He has no palpitations. No unusual shortness of breath. No chest pain. No exertional dyspnea. Minimal puffiness of his ankles at the end of the day. No dizziness, slurred speech, or blurred vision.  He is diabetic and has been so for over 30 years. His last hemoglobin A1c was 7.2. He now has a peripheral neuropathy that involves his feet, much greater than his hands. He had been tried on Neurontin with no real help. He checks his blood pressure at home. It has been under good control. He denies arthralgias or myalgias. He walks daily for at least 30 minutes, five to seven days a week. With this, he has no claudication, no unusual breathlessness. He has had no angina. He has diffuse T wave inversion on his EKG. He was catheterized in December 2009 that showed minimal coronary disease and normal LV function. He has no real complaints today, other than the increased cost of digoxin. Exactly unclear to me why he is on digoxin, as this medicine is unlikely to keep him out of atrial fibrillation. He does have a high CHADS2 score which is 4, despite in frequent atrial fibrillation. He continues on aspirin.  There are no new complaints of chest pain or worsening shortness of breath.  PMHx:  Past Medical History  Diagnosis Date  . Diabetes mellitus type II   . Hyperlipidemia   . Hypertension   .  Peripheral neuropathy   . Cerebrovascular accident 2003    history of small vessal lacunar stroke 2003  . Glaucoma   . Other specified cardiac dysrhythmias(427.89)     PSVT  . AF (atrial fibrillation)     2D ECHO, 03/01/1987 - normal; NUCLEAR STRESS TEST, 04/10/2006 - EF 65%, no ischemia  . Bruit     CAROTID DOPPLER, 09/20/2008 - normal, no evidence of diameter reduction, significant tortuousity or any other vascular abnormality    Past Surgical History  Procedure Laterality Date  . Anal fissure repair    . Cardiac catheterization  02/02/2001    Normal LV systolic function, questionable mitral valve prolapse without mitral regurgitation, no evidence of CAD  . Cardiac catheterization  09/13/2008    Minimal CAD, normal LV systolic function, no evidence of renal artery stenosis, distal aortic disease, or iliac disease    FAMHx:  History reviewed. No pertinent family history.  SOCHx:   reports that he has never smoked. He has never used smokeless tobacco. He reports that he does not drink alcohol or use illicit drugs.  ALLERGIES:  Allergies  Allergen Reactions  . Verapamil     ROS: A comprehensive review of systems was negative.  HOME MEDS: Current Outpatient Prescriptions  Medication Sig Dispense Refill  . aspirin (ASPIRIN EC) 81 MG EC tablet Take 81 mg by mouth daily.        Marland Kitchen  digoxin (LANOXIN) 0.25 MG tablet Take 1 tablet (250 mcg total) by mouth daily.  90 tablet  3  . glimepiride (AMARYL) 4 MG tablet Take 1 tablet (4 mg total) by mouth daily before breakfast.  180 tablet  3  . glucose blood (FREESTYLE TEST STRIPS) test strip 1 each by Other route 4 (four) times daily -  before meals and at bedtime. Use as instructed  100 each  12  . hydrochlorothiazide (HYDRODIURIL) 25 MG tablet Take 1 tablet (25 mg total) by mouth daily.  90 tablet  3  . insulin glargine (LANTUS SOLOSTAR) 100 UNIT/ML injection 40 units at bedtime  3 pen  6  . insulin lispro (HUMALOG) 100 UNIT/ML injection  Inject 40 Units into the skin. 2-3 times daily before meals      . Insulin Pen Needle (B-D ULTRAFINE III SHORT PEN) 31G X 8 MM MISC Use 4 times daily before meals and at bedtime  100 each  6  . latanoprost (XALATAN) 0.005 % ophthalmic solution Place 1 drop into both eyes daily.       Marland Kitchen lisinopril (PRINIVIL,ZESTRIL) 40 MG tablet Take 1 tablet (40 mg total) by mouth daily.  90 tablet  3  . lovastatin (MEVACOR) 40 MG tablet Take 1 tablet (40 mg total) by mouth at bedtime.  90 tablet  3  . metFORMIN (GLUCOPHAGE) 1000 MG tablet Take 1 tablet (1,000 mg total) by mouth 2 (two) times daily.  180 tablet  3  . timolol (TIMOPTIC-XR) 0.5 % ophthalmic gel-forming Place 1 drop into both eyes daily.        No current facility-administered medications for this visit.    LABS/IMAGING: No results found for this or any previous visit (from the past 48 hour(s)). No results found.  VITALS: BP 136/78  Pulse 78  Ht 6' (1.829 m)  Wt 230 lb 1.6 oz (104.373 kg)  BMI 31.2 kg/m2  EXAM: General appearance: alert and no distress Neck: no adenopathy, no carotid bruit, no JVD, supple, symmetrical, trachea midline and thyroid not enlarged, symmetric, no tenderness/mass/nodules Lungs: clear to auscultation bilaterally Heart: regular rate and rhythm, S1, S2 normal, no murmur, click, rub or gallop Abdomen: soft, non-tender; bowel sounds normal; no masses,  no organomegaly Extremities: extremities normal, atraumatic, no cyanosis or edema Pulses: 2+ and symmetric Skin: Skin color, texture, turgor normal. No rashes or lesions Neurologic: Grossly normal  EKG: Normal sinus rhythm with first degree AV block at 78, inferior and lateral T-wave inversions, unchanged  ASSESSMENT: 1. Paroxysmal atrial fibrillation, remote 2. Hypertension 3. History of stroke, with right central vision loss 4. Type 2 diabetes 5. Dyslipidemia  PLAN: 1.   Jimmy Carr has numerous cardiovascular risk factors including risk for stroke, which  is annually about 10%. He is currently on aspirin, due to the fact that he has infrequent atrial fibrillation. We did discuss the possibility of a novel oral anticoagulant, which may help reduce his stroke risk further. He is not interested in additional medications at this time. He wishes to consider possibly coming off digoxin, which I suggested, as he is maintaining sinus. It is unlikely that the digoxin is actually keeping him in a regular rhythm, however is difficult for him to wish to discontinue it due to the fact that he is fairly stable he in sinus. Perhaps the cost of the digoxin for now, will push him to stop the medicine. I do not see a clear indication for it. Plan to see him back annually or  sooner as necessary.  Chrystie Nose, MD, [facility] Attending Cardiologist [facility]  HILTY,Kenneth C 03/16/2013, 4:32 PM

## 2013-03-16 NOTE — Patient Instructions (Addendum)
Your physician recommends that you schedule a follow-up appointment in: 1 year  

## 2013-03-22 ENCOUNTER — Other Ambulatory Visit: Payer: Self-pay | Admitting: *Deleted

## 2013-03-22 MED ORDER — METFORMIN HCL 1000 MG PO TABS: 1000.0000 mg | ORAL_TABLET | Freq: Two times a day (BID) | ORAL | Status: DC

## 2013-03-22 MED ORDER — LOVASTATIN 40 MG PO TABS: 40.0000 mg | ORAL_TABLET | Freq: Every day | ORAL | Status: DC

## 2013-03-22 MED ORDER — LISINOPRIL 40 MG PO TABS: 40.0000 mg | ORAL_TABLET | Freq: Every day | ORAL | Status: DC

## 2013-04-08 DIAGNOSIS — H409 Unspecified glaucoma: Secondary | ICD-10-CM | POA: Diagnosis not present

## 2013-04-08 DIAGNOSIS — H4011X0 Primary open-angle glaucoma, stage unspecified: Secondary | ICD-10-CM | POA: Diagnosis not present

## 2013-04-22 ENCOUNTER — Other Ambulatory Visit: Payer: Self-pay | Admitting: Internal Medicine

## 2013-05-05 ENCOUNTER — Telehealth: Payer: Self-pay | Admitting: Internal Medicine

## 2013-05-05 NOTE — Telephone Encounter (Signed)
PT called and stated that there was a "mix up" when calling in his insulin lispro (HUMALOG) 100 UNIT/ML injection. He is inquiring to see if we have 1 or 2 samples of this pen to last him until it gets refilled. Please assist.

## 2013-05-05 NOTE — Telephone Encounter (Signed)
Spoke to pt told him I do have samples of Humalog Kwikpen for him will have ready to pick up just when he comes tell the front here to pickup insulin. Pt verbalized understanding.

## 2013-05-14 ENCOUNTER — Other Ambulatory Visit: Payer: Self-pay | Admitting: *Deleted

## 2013-05-14 MED ORDER — HYDROCHLOROTHIAZIDE 25 MG PO TABS: 25.0000 mg | ORAL_TABLET | Freq: Every day | ORAL | Status: DC

## 2013-05-14 MED ORDER — DIGOXIN 250 MCG PO TABS: 250.0000 ug | ORAL_TABLET | Freq: Every day | ORAL | Status: DC

## 2013-07-26 ENCOUNTER — Other Ambulatory Visit: Payer: Self-pay | Admitting: *Deleted

## 2013-07-26 MED ORDER — LISINOPRIL 40 MG PO TABS: 40.0000 mg | ORAL_TABLET | Freq: Every day | ORAL | Status: DC

## 2013-07-26 MED ORDER — GLIMEPIRIDE 4 MG PO TABS: 4.0000 mg | ORAL_TABLET | Freq: Every day | ORAL | Status: DC

## 2013-07-26 MED ORDER — HYDROCHLOROTHIAZIDE 25 MG PO TABS: 25.0000 mg | ORAL_TABLET | Freq: Every day | ORAL | Status: DC

## 2013-07-26 MED ORDER — LOVASTATIN 40 MG PO TABS: 40.0000 mg | ORAL_TABLET | Freq: Every day | ORAL | Status: DC

## 2013-07-26 MED ORDER — METFORMIN HCL 1000 MG PO TABS: 1000.0000 mg | ORAL_TABLET | Freq: Two times a day (BID) | ORAL | Status: DC

## 2013-07-26 MED ORDER — DIGOXIN 250 MCG PO TABS: 0.2500 mg | ORAL_TABLET | Freq: Every day | ORAL | Status: DC

## 2013-08-25 ENCOUNTER — Other Ambulatory Visit: Payer: Self-pay | Admitting: *Deleted

## 2013-08-25 MED ORDER — INSULIN GLARGINE 100 UNIT/ML ~~LOC~~ SOLN: SUBCUTANEOUS | Status: DC

## 2013-09-02 DIAGNOSIS — H4011X0 Primary open-angle glaucoma, stage unspecified: Secondary | ICD-10-CM | POA: Diagnosis not present

## 2013-09-02 DIAGNOSIS — H409 Unspecified glaucoma: Secondary | ICD-10-CM | POA: Diagnosis not present

## 2013-09-19 ENCOUNTER — Other Ambulatory Visit: Payer: Self-pay | Admitting: Internal Medicine

## 2013-10-10 ENCOUNTER — Other Ambulatory Visit: Payer: Self-pay | Admitting: Internal Medicine

## 2013-10-12 NOTE — Telephone Encounter (Signed)
Pt is out of glucose blood (FREESTYLE TEST STRIPS) test strip Pt test 3 x /day has not tested in 2 days. Can you send in to [facility]/ [address] /[place].  Pt last seen 11/13/12

## 2013-11-02 ENCOUNTER — Encounter: Payer: Self-pay | Admitting: Cardiology

## 2013-11-02 ENCOUNTER — Ambulatory Visit (INDEPENDENT_AMBULATORY_CARE_PROVIDER_SITE_OTHER): Payer: Medicare Other | Admitting: Cardiology

## 2013-11-02 ENCOUNTER — Telehealth: Payer: Self-pay | Admitting: Internal Medicine

## 2013-11-02 VITALS — BP 140/86 | HR 82 | Ht 72.0 in | Wt 232.0 lb

## 2013-11-02 DIAGNOSIS — R079 Chest pain, unspecified: Secondary | ICD-10-CM | POA: Diagnosis not present

## 2013-11-02 DIAGNOSIS — I10 Essential (primary) hypertension: Secondary | ICD-10-CM | POA: Diagnosis not present

## 2013-11-02 DIAGNOSIS — E119 Type 2 diabetes mellitus without complications: Secondary | ICD-10-CM

## 2013-11-02 DIAGNOSIS — I4891 Unspecified atrial fibrillation: Secondary | ICD-10-CM | POA: Diagnosis not present

## 2013-11-02 DIAGNOSIS — I2510 Atherosclerotic heart disease of native coronary artery without angina pectoris: Secondary | ICD-10-CM | POA: Insufficient documentation

## 2013-11-02 DIAGNOSIS — I480 Paroxysmal atrial fibrillation: Secondary | ICD-10-CM

## 2013-11-02 MED ORDER — NAPROXEN SODIUM 220 MG PO TABS: 220.0000 mg | ORAL_TABLET | Freq: Two times a day (BID) | ORAL | Status: DC | PRN

## 2013-11-02 NOTE — Telephone Encounter (Signed)
Returned call and pt verified x 2.  Pt c/o having CP in the middle of his chest for the last couple of days.  Denied SOB, other pains, numbness/tingling or sweating.  Stated when he is sitting he doesn't feel anything, but when he raises his arms, turns or bends over, it feels like it's pulling.  Stated BP has been in 150s/70s-80s.  Stated he has taken ASA w/o relief.  Does not have NTG.  Informed RN will discuss w/ Dr. Debara Pickett.  Dr. Debara Pickett notified and advised pt can come in to be seen if bothering him, but symptoms do not sound like it they are cardiac-related.    Returned call and informed pt per instructions by MD/PA.  Pt verbalized understanding and agreed w/ plan.  Appt scheduled for today at 3pm w/ [name], PA-C.

## 2013-11-02 NOTE — Progress Notes (Signed)
11/02/2013 Jimmy Carr   1941-03-03  [id number]  Primary Physicia Jimmy Cowden, MD Primary Cardiologist: Dr Jimmy Carr  HPI:  73 y/o male with DM, HTN, dyslipidemia, and past history of PAF. He had two caths in the past, in '02 and '09 showing minor CAD. He is in the office today as an add on for chest pain. The pt says he noted mid sternal chest discomfort starting 3 days ago. He describes it as localized and can place his finger on the spot. It is tender to palpation and worse with movement. He denies associated SOB, nausea, or radiation to his arms or jaw. He thinks his symptoms started after some exertional activity (had intercourse with his wife). He took an ASA without relief. He has an abnormal EKG at baseline with TWI   But this looks unchanged from previous EKGs.    Current Outpatient Prescriptions  Medication Sig Dispense Refill  . aspirin (ASPIRIN EC) 81 MG EC tablet Take 81 mg by mouth daily.        . B-D ULTRAFINE III SHORT PEN 31G X 8 MM MISC USE 4 TIMES DAILY BEFORE MEALS AND AT BEDTIME  100 each  6  . digoxin (LANOXIN) 0.25 MG tablet Take 1 tablet (0.25 mg total) by mouth daily.  90 tablet  3  . FREESTYLE TEST STRIPS test strip TEST 4 TIMES DAILY BEFORE MEALS AND AT BEDTIME  100 each  1  . glimepiride (AMARYL) 4 MG tablet Take 1 tablet (4 mg total) by mouth daily before breakfast.  90 tablet  1  . HUMALOG KWIKPEN 100 UNIT/ML SOPN INJECT 40 UNITS INTO THE SKIN 3 (THREE) TIMES DAILY BEFORE MEALS.  9 mL  12  . hydrochlorothiazide (HYDRODIURIL) 25 MG tablet Take 1 tablet (25 mg total) by mouth daily.  90 tablet  3  . insulin glargine (LANTUS) 100 UNIT/ML injection 40 units at bedtime  5 pen  2  . insulin lispro (HUMALOG) 100 UNIT/ML injection Inject 40 Units into the skin. 2-3 times daily before meals      . LANTUS SOLOSTAR 100 UNIT/ML SOPN INJECT 40 UNITS AT BEDTIME  9 mL  5  . latanoprost (XALATAN) 0.005 % ophthalmic solution Place 1 drop into both eyes daily.       Marland Kitchen  lisinopril (PRINIVIL,ZESTRIL) 40 MG tablet Take 1 tablet (40 mg total) by mouth daily.  90 tablet  1  . lovastatin (MEVACOR) 40 MG tablet Take 1 tablet (40 mg total) by mouth at bedtime.  90 tablet  1  . metFORMIN (GLUCOPHAGE) 1000 MG tablet Take 1 tablet (1,000 mg total) by mouth 2 (two) times daily.  180 tablet  1  . timolol (TIMOPTIC-XR) 0.5 % ophthalmic gel-forming Place 1 drop into both eyes daily.       . naproxen sodium (ALEVE) 220 MG tablet Take 1 tablet (220 mg total) by mouth 2 (two) times daily as needed.       No current facility-administered medications for this visit.    Allergies  Allergen Reactions  . Verapamil     History   Social History  . Marital Status: Married    Spouse Name: N/A    Number of Children: N/A  . Years of Education: N/A   Occupational History  . Not on file.   Social History Main Topics  . Smoking status: Never Smoker   . Smokeless tobacco: Never Used  . Alcohol Use: No  . Drug Use: No  . Sexual Activity: Not  on file   Other Topics Concern  . Not on file   Social History Narrative   Non smoker    On disability     Review of Systems: General: negative for chills, fever, night sweats or weight changes.  Cardiovascular: negative for chest pain, dyspnea on exertion, edema, orthopnea, palpitations, paroxysmal nocturnal dyspnea or shortness of breath Dermatological: negative for rash Respiratory: negative for cough or wheezing Urologic: negative for hematuria Abdominal: negative for nausea, vomiting, diarrhea, bright red blood per rectum, melena, or hematemesis Neurologic: negative for visual changes, syncope, or dizziness All other systems reviewed and are otherwise negative except as noted above.    Blood pressure 140/86, pulse 82, height 6' (1.829 m), weight 232 lb (105.235 kg).  General appearance: alert, cooperative and no distress Neck: no carotid bruit and no JVD Lungs: clear to auscultation bilaterally Heart: regular rate  and rhythm  EKG NSR TWI 1,2, AVL, V4-V6  ASSESSMENT AND PLAN:   Chest pain with low risk for cardiac etiology Pt seen in the office as an add on. Symptoms atypical for angina  DIABETES MELLITUS, TYPE II .  HYPERTENSION .  PAF (paroxysmal atrial fibrillation) Off Coumadin, no recurrence  CAD- minor CAD at cath '02 and '09.  Marland Kitchen   PLAN  I do not think [name]'s chest pain is anginal. I suggested he try Aleve for a couple of days to see if that helps. I discussed typical angina symptoms with the pt and his wife and they know to call us if his symptoms change. If his symptoms persist or start to sound more typical of angina we can proceed to a Myoview.     [facility] 11/02/2013 3:39 PM

## 2013-11-02 NOTE — Assessment & Plan Note (Signed)
Pt seen in the office as an add on. Symptoms atypical for angina

## 2013-11-02 NOTE — Assessment & Plan Note (Signed)
Off Coumadin, no recurrence

## 2013-11-02 NOTE — Telephone Encounter (Signed)
Please call-been having chest pains off and on.Not sure what it is.Should he come in to be seen?

## 2013-11-02 NOTE — Patient Instructions (Signed)
Try Aleve twice a day with food If your symptoms change call back

## 2013-11-08 ENCOUNTER — Telehealth: Payer: Self-pay | Admitting: Internal Medicine

## 2013-11-08 NOTE — Telephone Encounter (Signed)
Sherri L. with [employer] calling to state digoxin (LANOXIN) 0.25 MG tablet tier exception has been approved for 1 year.

## 2013-11-11 ENCOUNTER — Ambulatory Visit (INDEPENDENT_AMBULATORY_CARE_PROVIDER_SITE_OTHER): Payer: Medicare Other | Admitting: Internal Medicine

## 2013-11-11 ENCOUNTER — Encounter: Payer: Self-pay | Admitting: *Deleted

## 2013-11-11 ENCOUNTER — Telehealth: Payer: Self-pay | Admitting: Internal Medicine

## 2013-11-11 VITALS — BP 140/70 | HR 76 | Ht 72.0 in | Wt 232.0 lb

## 2013-11-11 DIAGNOSIS — I4949 Other premature depolarization: Secondary | ICD-10-CM | POA: Diagnosis not present

## 2013-11-11 DIAGNOSIS — I4891 Unspecified atrial fibrillation: Secondary | ICD-10-CM

## 2013-11-11 DIAGNOSIS — I2510 Atherosclerotic heart disease of native coronary artery without angina pectoris: Secondary | ICD-10-CM

## 2013-11-11 DIAGNOSIS — I480 Paroxysmal atrial fibrillation: Secondary | ICD-10-CM

## 2013-11-11 DIAGNOSIS — I493 Ventricular premature depolarization: Secondary | ICD-10-CM

## 2013-11-11 NOTE — Patient Instructions (Addendum)
Your physician has recommended you make the following change in your medication: CONTINUE ALL MEDICATIONS. No changes were made today in your therapy.  Your physician recommends that you schedule a follow-up appointment in: 6 months with Dr. Debara Pickett.

## 2013-11-11 NOTE — Telephone Encounter (Signed)
Returned call and pt verified x 2.  Pt stated he noticed that his heart is skipping beats every once in a while.  Stated he was checking his BP Tuesday and noticed that it skips beats every 5-10 beats.  Stated he was seen recently and told he had an irregular EKG, but didn't know if this is r/t that or not.  Pt informed Dr. Debara Pickett will be notified to review EKG from last OV (2.3.15) and for further instructions.  Pt verbalized understanding and agreed w/ plan.  Pt denied CP, SOB or other symptoms.  Message forwarded to Dr. Debara Pickett.

## 2013-11-11 NOTE — Progress Notes (Signed)
OFFICE NOTE  Chief Complaint:  Yearly office visit  Primary Care Physician: Nyoka Cowden, MD  HPI:  Jimmy Carr is a 73 year-old male with a history of PAF. His atrial fib actually started in the mid 80s. He was on Coumadin, beta blockers, and Lanoxin, and over the years, we have been able to wean him off his beta blockers. He is still on a low dose of Lanoxin and several years ago, we stopped the Coumadin, and his CHADS score at that time was 2, but with no atrial fibrillation in over 10 years, we stopped the Coumadin. He is on aspirin. He has had no recurrence of any atrial fib in over 15 years. He has no palpitations. No unusual shortness of breath. No chest pain. No exertional dyspnea. Minimal puffiness of his ankles at the end of the day. No dizziness, slurred speech, or blurred vision.  He is diabetic and has been so for over 30 years. His last hemoglobin A1c was 7.2. He now has a peripheral neuropathy that involves his feet, much greater than his hands. He had been tried on Neurontin with no real help. He checks his blood pressure at home. It has been under good control. He denies arthralgias or myalgias. He walks daily for at least 30 minutes, five to seven days a week. With this, he has no claudication, no unusual breathlessness. He has had no angina. He has diffuse T wave inversion on his EKG. He was catheterized in December 2009 that showed minimal coronary disease and normal LV function. He has no real complaints today, other than the increased cost of digoxin. Exactly unclear to me why he is on digoxin, as this medicine is unlikely to keep him out of atrial fibrillation. He does have a high CHADS2 score which is 4, despite in frequent atrial fibrillation. He continues on aspirin.  There are no new complaints of chest pain or worsening shortness of breath.  [name] recently saw [name] in the office for what he thought was atypical chest pain. He recommended a  nonsteroidal. He is also reported more recent palpitations. He's been under significant stress recently. Today he called the office several times and spoke with our triage nurse about concern for atrial fibrillation. I did advise him that he may need to come in for an EKG to see if is actually in atrial fibrillation. Ultimately he did decide to do that.  PMHx:  Past Medical History  Diagnosis Date  . Diabetes mellitus type II   . Hyperlipidemia   . Hypertension   . Peripheral neuropathy   . Cerebrovascular accident 2003    history of small vessal lacunar stroke 2003  . Glaucoma   . Other specified cardiac dysrhythmias(427.89)     PSVT  . AF (atrial fibrillation)     2D ECHO, 03/01/1987 - normal; NUCLEAR STRESS TEST, 04/10/2006 - EF 65%, no ischemia  . Bruit     CAROTID DOPPLER, 09/20/2008 - normal, no evidence of diameter reduction, significant tortuousity or any other vascular abnormality    Past Surgical History  Procedure Laterality Date  . Anal fissure repair    . Cardiac catheterization  02/02/2001    Normal LV systolic function, questionable mitral valve prolapse without mitral regurgitation, no evidence of CAD  . Cardiac catheterization  09/13/2008    Minimal CAD, normal LV systolic function, no evidence of renal artery stenosis, distal aortic disease, or iliac disease    FAMHx:  History reviewed. No pertinent  family history.  SOCHx:   reports that he has never smoked. He has never used smokeless tobacco. He reports that he does not drink alcohol or use illicit drugs.  ALLERGIES:  Allergies  Allergen Reactions  . Verapamil     ROS: A comprehensive review of systems was negative except for: Constitutional: positive for fatigue Cardiovascular: positive for chest pain and palpitations  HOME MEDS: Current Outpatient Prescriptions  Medication Sig Dispense Refill  . aspirin (ASPIRIN EC) 81 MG EC tablet Take 81 mg by mouth daily.        . digoxin (LANOXIN) 0.25 MG tablet  Take 1 tablet (0.25 mg total) by mouth daily.  90 tablet  3  . glimepiride (AMARYL) 4 MG tablet Take 1 tablet (4 mg total) by mouth daily before breakfast.  90 tablet  1  . HUMALOG KWIKPEN 100 UNIT/ML SOPN INJECT 40 UNITS INTO THE SKIN 3 (THREE) TIMES DAILY BEFORE MEALS.  9 mL  12  . hydrochlorothiazide (HYDRODIURIL) 25 MG tablet Take 1 tablet (25 mg total) by mouth daily.  90 tablet  3  . insulin glargine (LANTUS) 100 UNIT/ML injection 40 units at bedtime  5 pen  2  . insulin lispro (HUMALOG) 100 UNIT/ML injection Inject 40 Units into the skin. 2-3 times daily before meals      . LANTUS SOLOSTAR 100 UNIT/ML SOPN INJECT 40 UNITS AT BEDTIME  9 mL  5  . latanoprost (XALATAN) 0.005 % ophthalmic solution Place 1 drop into both eyes daily.       Marland Kitchen lisinopril (PRINIVIL,ZESTRIL) 40 MG tablet Take 1 tablet (40 mg total) by mouth daily.  90 tablet  1  . lovastatin (MEVACOR) 40 MG tablet Take 1 tablet (40 mg total) by mouth at bedtime.  90 tablet  1  . metFORMIN (GLUCOPHAGE) 1000 MG tablet Take 1 tablet (1,000 mg total) by mouth 2 (two) times daily.  180 tablet  1  . naproxen sodium (ALEVE) 220 MG tablet Take 1 tablet (220 mg total) by mouth 2 (two) times daily as needed.      . timolol (TIMOPTIC-XR) 0.5 % ophthalmic gel-forming Place 1 drop into both eyes daily.        No current facility-administered medications for this visit.    LABS/IMAGING: No results found for this or any previous visit (from the past 48 hour(s)). No results found.  VITALS: BP 140/70  Pulse 76  Ht 6' (1.829 m)  Wt 232 lb (105.235 kg)  BMI 31.46 kg/m2  EXAM: deferred  EKG: Normal sinus rhythm at 76 with PAC's  ASSESSMENT: 1. Paroxysmal atrial fibrillation, remote - now with PAC's 2. Hypertension 3. History of stroke, with right central vision loss 4. Type 2 diabetes 5. Dyslipidemia 6. Atypical CP  PLAN: 1.   [name] is not having atrial fibrillation at this time but PACs. I've reassured him that is  appropriately treated. If he continues to have worsening palpitations or longer periods of palpitations, he may need to wear a monitor to try to detect atrial fibrillation.  He is on aspirin based on his very remote history of atrial fibrillation. I do not think is compelling evidence to put him on a stronger anticoagulant at this time.  Plan to see him back in 6 months or sooner as necessary.  Pixie Casino, MD, [facility] Attending Cardiologist [facility] [place] [facility] 11/11/2013, 2:35 PM

## 2013-11-11 NOTE — Telephone Encounter (Signed)
Returned call and informed pt per instructions by MD.  Pt wants to know what would be the plan if he comes back in and it is Afib.  Pt informed Dr. Debara Pickett will be notified.  Pt verbalized understanding and agreed w/ plan.  Message forwarded to Dr. Debara Pickett.

## 2013-11-11 NOTE — Telephone Encounter (Signed)
Think he is going into AFIB.Marland Kitchen Heart is skipping a beat  .Marland Kitchen It goes 5 or 10 times normal then it skips please call .Marland Kitchen  Thanks

## 2013-11-11 NOTE — Telephone Encounter (Signed)
He apparently has known PAF - may be going out of rhythm. If he wants, he can come by the office and we could have an EKG done as a nurse appointment or he can see a midlevel.  Dr. Debara Pickett

## 2013-11-11 NOTE — Telephone Encounter (Signed)
Spoke w/ Dr. Debara Pickett.  Advised pt will go in and out of AF r/t PAF.  If bothersome for pt, then he should be seen for EKG or w/ MLP.   Call to pt and explained this.  Pt decided he wanted to come in for EKG.  Appt scheduled for today at 1:30pm on Nurse Schedule.

## 2013-12-15 ENCOUNTER — Encounter: Payer: Self-pay | Admitting: Family

## 2013-12-15 ENCOUNTER — Ambulatory Visit (INDEPENDENT_AMBULATORY_CARE_PROVIDER_SITE_OTHER): Payer: Medicare Other | Admitting: Family

## 2013-12-15 ENCOUNTER — Telehealth: Payer: Self-pay | Admitting: Internal Medicine

## 2013-12-15 VITALS — BP 148/68 | HR 99 | Temp 98.9°F | Ht 72.0 in | Wt 238.0 lb

## 2013-12-15 DIAGNOSIS — I2510 Atherosclerotic heart disease of native coronary artery without angina pectoris: Secondary | ICD-10-CM

## 2013-12-15 DIAGNOSIS — J069 Acute upper respiratory infection, unspecified: Secondary | ICD-10-CM

## 2013-12-15 DIAGNOSIS — E119 Type 2 diabetes mellitus without complications: Secondary | ICD-10-CM | POA: Diagnosis not present

## 2013-12-15 MED ORDER — GUAIFENESIN-CODEINE 100-10 MG/5ML PO SYRP: 5.0000 mL | ORAL_SOLUTION | Freq: Three times a day (TID) | ORAL | Status: DC | PRN

## 2013-12-15 NOTE — Patient Instructions (Signed)

## 2013-12-15 NOTE — Progress Notes (Signed)
Pre visit review using our clinic review tool, if applicable. No additional management support is needed unless otherwise documented below in the visit note. 

## 2013-12-15 NOTE — Telephone Encounter (Signed)
PRIMEM[employer] (MAIL ORDER) ELECTRONIC - [place], [place] is requesting re-fills on the following:  lovastatin (MEVACOR) 40 MG tablet metFORMIN (GLUCOPHAGE) 1000 MG tablet lisinopril (PRINIVIL,ZESTRIL) 40 MG tablet glimepiride (AMARYL) 4 MG tablet

## 2013-12-17 ENCOUNTER — Encounter: Payer: Self-pay | Admitting: Family

## 2013-12-17 ENCOUNTER — Ambulatory Visit (INDEPENDENT_AMBULATORY_CARE_PROVIDER_SITE_OTHER): Payer: Medicare Other | Admitting: Internal Medicine

## 2013-12-17 ENCOUNTER — Other Ambulatory Visit: Payer: Self-pay | Admitting: *Deleted

## 2013-12-17 ENCOUNTER — Encounter: Payer: Self-pay | Admitting: Internal Medicine

## 2013-12-17 ENCOUNTER — Ambulatory Visit: Payer: Medicare Other | Admitting: Internal Medicine

## 2013-12-17 VITALS — BP 140/80 | HR 79 | Temp 99.6°F | Resp 20 | Ht 72.0 in | Wt 236.0 lb

## 2013-12-17 DIAGNOSIS — J069 Acute upper respiratory infection, unspecified: Secondary | ICD-10-CM

## 2013-12-17 DIAGNOSIS — I2510 Atherosclerotic heart disease of native coronary artery without angina pectoris: Secondary | ICD-10-CM

## 2013-12-17 DIAGNOSIS — E119 Type 2 diabetes mellitus without complications: Secondary | ICD-10-CM | POA: Diagnosis not present

## 2013-12-17 DIAGNOSIS — I10 Essential (primary) hypertension: Secondary | ICD-10-CM | POA: Diagnosis not present

## 2013-12-17 MED ORDER — DOXYCYCLINE HYCLATE 100 MG PO TABS: 100.0000 mg | ORAL_TABLET | Freq: Two times a day (BID) | ORAL | Status: DC

## 2013-12-17 MED ORDER — INSULIN LISPRO 100 UNIT/ML (KWIKPEN): PEN_INJECTOR | SUBCUTANEOUS | Status: DC

## 2013-12-17 MED ORDER — INSULIN PEN NEEDLE 32G X 4 MM MISC: 1.0000 | Freq: Three times a day (TID) | Status: DC

## 2013-12-17 MED ORDER — GLIMEPIRIDE 4 MG PO TABS: 4.0000 mg | ORAL_TABLET | Freq: Every day | ORAL | Status: DC

## 2013-12-17 MED ORDER — LISINOPRIL 40 MG PO TABS: 40.0000 mg | ORAL_TABLET | Freq: Every day | ORAL | Status: DC

## 2013-12-17 MED ORDER — BENZONATATE 200 MG PO CAPS: 200.0000 mg | ORAL_CAPSULE | Freq: Two times a day (BID) | ORAL | Status: DC | PRN

## 2013-12-17 MED ORDER — LOVASTATIN 40 MG PO TABS: 40.0000 mg | ORAL_TABLET | Freq: Every day | ORAL | Status: DC

## 2013-12-17 MED ORDER — METFORMIN HCL 1000 MG PO TABS: 1000.0000 mg | ORAL_TABLET | Freq: Two times a day (BID) | ORAL | Status: DC

## 2013-12-17 MED ORDER — GLUCOSE BLOOD VI STRP: 1.0000 | ORAL_STRIP | Freq: Three times a day (TID) | Status: DC | PRN

## 2013-12-17 MED ORDER — INSULIN GLARGINE 100 UNIT/ML SOLOSTAR PEN: PEN_INJECTOR | SUBCUTANEOUS | Status: DC

## 2013-12-17 NOTE — Progress Notes (Signed)
Subjective:    Patient ID: Jimmy Carr, male    DOB: [birth year], 73 y.o.   MRN: [id number]  HPI 73 year old patient who is seen for followup.  He has type 2 diabetes that has not been seen in over one year.  He was seen 2 days ago with a URI and treated symptomatically.  He did not tolerate the antitussives with nausea and vomiting.  His chief complaint today is testicular discomfort. His cough is modestly improved.  Still has low-grade fever. He has diabetes, controlled with basal and bolus insulin.  He states fasting blood sugars are generally 1:30 to 140.  He has been on Lantus insulin 40 units at bedtime, as well as Humalog 40 units prior to each meal.  He said his blood sugars generally are in the 1:30 to 140 range throughout the day but have been elevated for the past 3 or 4 weeks.  He is followed closely by cardiology. Diabetic complications include peripheral neuropathy  Past Medical History  Diagnosis Date  . Diabetes mellitus type II   . Hyperlipidemia   . Hypertension   . Peripheral neuropathy   . Cerebrovascular accident 2003    history of small vessal lacunar stroke 2003  . Glaucoma   . Other specified cardiac dysrhythmias(427.89)     PSVT  . AF (atrial fibrillation)     2D ECHO, 03/01/1987 - normal; NUCLEAR STRESS TEST, 04/10/2006 - EF 65%, no ischemia  . Bruit     CAROTID DOPPLER, 09/20/2008 - normal, no evidence of diameter reduction, significant tortuousity or any other vascular abnormality    History   Social History  . Marital Status: Married    Spouse Name: N/A    Number of Children: N/A  . Years of Education: N/A   Occupational History  . Not on file.   Social History Main Topics  . Smoking status: Never Smoker   . Smokeless tobacco: Never Used  . Alcohol Use: No  . Drug Use: No  . Sexual Activity: Not on file   Other Topics Concern  . Not on file   Social History Narrative   Non smoker    On disability    Past Surgical History  Procedure  Laterality Date  . Anal fissure repair    . Cardiac catheterization  02/02/2001    Normal LV systolic function, questionable mitral valve prolapse without mitral regurgitation, no evidence of CAD  . Cardiac catheterization  09/13/2008    Minimal CAD, normal LV systolic function, no evidence of renal artery stenosis, distal aortic disease, or iliac disease    History reviewed. No pertinent family history.  Allergies  Allergen Reactions  . Cheratussin Ac [Guaifenesin-Codeine] Nausea And Vomiting  . Verapamil     Current Outpatient Prescriptions on File Prior to Visit  Medication Sig Dispense Refill  . aspirin (ASPIRIN EC) 81 MG EC tablet Take 81 mg by mouth daily.        . digoxin (LANOXIN) 0.25 MG tablet Take 1 tablet (0.25 mg total) by mouth daily.  90 tablet  3  . HUMALOG KWIKPEN 100 UNIT/ML SOPN INJECT 40 UNITS INTO THE SKIN 3 (THREE) TIMES DAILY BEFORE MEALS.  9 mL  12  . hydrochlorothiazide (HYDRODIURIL) 25 MG tablet Take 1 tablet (25 mg total) by mouth daily.  90 tablet  3  . insulin glargine (LANTUS) 100 UNIT/ML injection 40 units at bedtime  5 pen  2  . insulin lispro (HUMALOG) 100 UNIT/ML injection Inject 40 Units  into the skin. 2-3 times daily before meals      . LANTUS SOLOSTAR 100 UNIT/ML SOPN INJECT 40 UNITS AT BEDTIME  9 mL  5  . latanoprost (XALATAN) 0.005 % ophthalmic solution Place 1 drop into both eyes daily.       . naproxen sodium (ALEVE) 220 MG tablet Take 1 tablet (220 mg total) by mouth 2 (two) times daily as needed.      . timolol (TIMOPTIC-XR) 0.5 % ophthalmic gel-forming Place 1 drop into both eyes daily.        No current facility-administered medications on file prior to visit.    BP 140/80  Pulse 79  Temp(Src) 99.6 F (37.6 C) (Oral)  Resp 20  Ht 6' (1.829 m)  Wt 236 lb (107.049 kg)  BMI 32.00 kg/m2  SpO2 96%       Review of Systems  Constitutional: Positive for activity change, appetite change and fatigue. Negative for fever and chills.    HENT: Negative for congestion, dental problem, ear pain, hearing loss, sore throat, tinnitus, trouble swallowing and voice change.   Eyes: Negative for pain, discharge and visual disturbance.  Respiratory: Positive for cough. Negative for chest tightness, wheezing and stridor.   Cardiovascular: Negative for chest pain, palpitations and leg swelling.  Gastrointestinal: Negative for nausea, vomiting, abdominal pain, diarrhea, constipation, blood in stool and abdominal distention.  Genitourinary: Positive for testicular pain. Negative for urgency, hematuria, flank pain, discharge, difficulty urinating and genital sores.  Musculoskeletal: Negative for arthralgias, back pain, gait problem, joint swelling, myalgias and neck stiffness.  Skin: Negative for rash.  Neurological: Negative for dizziness, syncope, speech difficulty, weakness, numbness and headaches.  Hematological: Negative for adenopathy. Does not bruise/bleed easily.  Psychiatric/Behavioral: Negative for behavioral problems and dysphoric mood. The patient is not nervous/anxious.        Objective:   Physical Exam  Constitutional: He is oriented to person, place, and time. He appears well-developed.  HENT:  Head: Normocephalic.  Right Ear: External ear normal.  Left Ear: External ear normal.  Eyes: Conjunctivae and EOM are normal.  Neck: Normal range of motion.  Cardiovascular: Normal rate and normal heart sounds.   Pulmonary/Chest: Breath sounds normal.  Abdominal: Bowel sounds are normal.  Genitourinary: Penis normal. No penile tenderness.  Musculoskeletal: Normal range of motion. He exhibits no edema and no tenderness.  Neurological: He is alert and oriented to person, place, and time.  Psychiatric: He has a normal mood and affect. His behavior is normal.          Assessment & Plan:   Diabetes mellitus.  Will increase Lantus to 50 units at bedtime.  Will continue meal time insulin with sliding scale coverage.  Recheck 3  months with hemoglobin A1c, as well as a complete physical Viral URI Testicular pain.  Possible early epididymitis.  We'll shave and doxycycline for 7 days Hypertension Peripheral neuropathy Dyslipidemia.  Continue statin therapy  CPX 3 months

## 2013-12-17 NOTE — Patient Instructions (Signed)
Take your antibiotic as prescribed until ALL of it is gone, but stop if you develop a rash, swelling, or any side effects of the medication.  Contact our office as soon as possible if  there are side effects of the medication.   Please check your hemoglobin A1c every 3 months   

## 2013-12-17 NOTE — Telephone Encounter (Signed)
Refills were done. 

## 2013-12-17 NOTE — Progress Notes (Signed)
Pre-visit discussion using our clinic review tool. No additional management support is needed unless otherwise documented below in the visit note.  

## 2013-12-17 NOTE — Progress Notes (Signed)
Subjective:    Patient ID: Jimmy Carr, male    DOB: 06/30/1941, 73 y.o.   MRN: [id number]  HPI 73 year old white male, nonsmoker is in today with complaints of sore throat, cough, congestion x3 days despite taking an aspirin, sinus medication, and cough drops. Denies fever reports chills. Reports granddaughter being thick and well.   Review of Systems  Constitutional: Positive for chills. Negative for fever.  HENT: Positive for congestion, postnasal drip and sore throat.   Respiratory: Positive for cough. Negative for shortness of breath and wheezing.   Cardiovascular: Negative.   Gastrointestinal: Negative.   Endocrine: Negative.   Musculoskeletal: Negative.   Skin: Negative.   Allergic/Immunologic: Negative.   Neurological: Negative.   Psychiatric/Behavioral: Negative.    Past Medical History  Diagnosis Date  . Diabetes mellitus type II   . Hyperlipidemia   . Hypertension   . Peripheral neuropathy   . Cerebrovascular accident 2003    history of small vessal lacunar stroke 2003  . Glaucoma   . Other specified cardiac dysrhythmias(427.89)     PSVT  . AF (atrial fibrillation)     2D ECHO, 03/01/1987 - normal; NUCLEAR STRESS TEST, 04/10/2006 - EF 65%, no ischemia  . Bruit     CAROTID DOPPLER, 09/20/2008 - normal, no evidence of diameter reduction, significant tortuousity or any other vascular abnormality    History   Social History  . Marital Status: Married    Spouse Name: N/A    Number of Children: N/A  . Years of Education: N/A   Occupational History  . Not on file.   Social History Main Topics  . Smoking status: Never Smoker   . Smokeless tobacco: Never Used  . Alcohol Use: No  . Drug Use: No  . Sexual Activity: Not on file   Other Topics Concern  . Not on file   Social History Narrative   Non smoker    On disability    Past Surgical History  Procedure Laterality Date  . Anal fissure repair    . Cardiac catheterization  02/02/2001    Normal LV  systolic function, questionable mitral valve prolapse without mitral regurgitation, no evidence of CAD  . Cardiac catheterization  09/13/2008    Minimal CAD, normal LV systolic function, no evidence of renal artery stenosis, distal aortic disease, or iliac disease    History reviewed. No pertinent family history.  Allergies  Allergen Reactions  . Verapamil     Current Outpatient Prescriptions on File Prior to Visit  Medication Sig Dispense Refill  . aspirin (ASPIRIN EC) 81 MG EC tablet Take 81 mg by mouth daily.        . digoxin (LANOXIN) 0.25 MG tablet Take 1 tablet (0.25 mg total) by mouth daily.  90 tablet  3  . glimepiride (AMARYL) 4 MG tablet Take 1 tablet (4 mg total) by mouth daily before breakfast.  90 tablet  1  . HUMALOG KWIKPEN 100 UNIT/ML SOPN INJECT 40 UNITS INTO THE SKIN 3 (THREE) TIMES DAILY BEFORE MEALS.  9 mL  12  . hydrochlorothiazide (HYDRODIURIL) 25 MG tablet Take 1 tablet (25 mg total) by mouth daily.  90 tablet  3  . insulin glargine (LANTUS) 100 UNIT/ML injection 40 units at bedtime  5 pen  2  . insulin lispro (HUMALOG) 100 UNIT/ML injection Inject 40 Units into the skin. 2-3 times daily before meals      . LANTUS SOLOSTAR 100 UNIT/ML SOPN INJECT 40 UNITS AT BEDTIME  9 mL  5  . latanoprost (XALATAN) 0.005 % ophthalmic solution Place 1 drop into both eyes daily.       Marland Kitchen lisinopril (PRINIVIL,ZESTRIL) 40 MG tablet Take 1 tablet (40 mg total) by mouth daily.  90 tablet  1  . lovastatin (MEVACOR) 40 MG tablet Take 1 tablet (40 mg total) by mouth at bedtime.  90 tablet  1  . metFORMIN (GLUCOPHAGE) 1000 MG tablet Take 1 tablet (1,000 mg total) by mouth 2 (two) times daily.  180 tablet  1  . naproxen sodium (ALEVE) 220 MG tablet Take 1 tablet (220 mg total) by mouth 2 (two) times daily as needed.      . timolol (TIMOPTIC-XR) 0.5 % ophthalmic gel-forming Place 1 drop into both eyes daily.        No current facility-administered medications on file prior to visit.    BP  148/68  Pulse 99  Temp(Src) 98.9 F (37.2 C) (Oral)  Ht 6' (1.829 m)  Wt 238 lb (107.956 kg)  BMI 32.27 kg/m2chart     Objective:   Physical Exam  Constitutional: He is oriented to person, place, and time. He appears well-developed and well-nourished.  HENT:  Right Ear: External ear normal.  Left Ear: External ear normal.  Nose: Nose normal.  Mouth/Throat: Oropharynx is clear and moist.  Neck: Normal range of motion. Neck supple.  Cardiovascular: Normal rate, regular rhythm and normal heart sounds.   Pulmonary/Chest: Effort normal and breath sounds normal.  Musculoskeletal: Normal range of motion.  Neurological: He is alert and oriented to person, place, and time.  Skin: Skin is warm and dry.  Psychiatric: He has a normal mood and affect.          Assessment & Plan:  Tinsley was seen today for sore throat and cough.  Diagnoses and associated orders for this visit:  Upper respiratory infection  Other Orders - guaiFENesin-codeine (CHERATUSSIN AC) 100-10 MG/5ML syrup; Take 5 mLs by mouth 3 (three) times daily as needed.    call the office if symptoms worsen or persist. Recheck as scheduled and as needed.

## 2013-12-20 ENCOUNTER — Telehealth: Payer: Self-pay | Admitting: Internal Medicine

## 2013-12-20 NOTE — Telephone Encounter (Signed)
Relevant patient education mailed to patient.  

## 2014-01-31 ENCOUNTER — Telehealth: Payer: Self-pay | Admitting: Internal Medicine

## 2014-01-31 MED ORDER — INSULIN LISPRO 100 UNIT/ML (KWIKPEN): PEN_INJECTOR | SUBCUTANEOUS | Status: DC

## 2014-01-31 NOTE — Telephone Encounter (Signed)
Spoke to pt told him printed Rx was given last visit. Pt said he does not have it. Told him okay will send new Rx to pharmacy. Pt verbalized understanding.

## 2014-01-31 NOTE — Telephone Encounter (Signed)
Pt needs some humalog flexpen

## 2014-02-14 DIAGNOSIS — H401133 Primary open-angle glaucoma, bilateral, severe stage: Secondary | ICD-10-CM | POA: Insufficient documentation

## 2014-02-14 DIAGNOSIS — H4011X0 Primary open-angle glaucoma, stage unspecified: Secondary | ICD-10-CM | POA: Diagnosis not present

## 2014-02-14 DIAGNOSIS — H409 Unspecified glaucoma: Secondary | ICD-10-CM | POA: Diagnosis not present

## 2014-03-09 ENCOUNTER — Encounter: Payer: Self-pay | Admitting: Internal Medicine

## 2014-05-04 ENCOUNTER — Other Ambulatory Visit (INDEPENDENT_AMBULATORY_CARE_PROVIDER_SITE_OTHER): Payer: Medicare Other

## 2014-05-04 DIAGNOSIS — E785 Hyperlipidemia, unspecified: Secondary | ICD-10-CM

## 2014-05-04 DIAGNOSIS — E039 Hypothyroidism, unspecified: Secondary | ICD-10-CM

## 2014-05-04 DIAGNOSIS — IMO0001 Reserved for inherently not codable concepts without codable children: Secondary | ICD-10-CM

## 2014-05-04 DIAGNOSIS — R350 Frequency of micturition: Secondary | ICD-10-CM

## 2014-05-04 DIAGNOSIS — R7989 Other specified abnormal findings of blood chemistry: Secondary | ICD-10-CM

## 2014-05-04 DIAGNOSIS — N401 Enlarged prostate with lower urinary tract symptoms: Secondary | ICD-10-CM

## 2014-05-04 DIAGNOSIS — I10 Essential (primary) hypertension: Secondary | ICD-10-CM

## 2014-05-04 DIAGNOSIS — E1165 Type 2 diabetes mellitus with hyperglycemia: Secondary | ICD-10-CM

## 2014-05-04 DIAGNOSIS — N138 Other obstructive and reflux uropathy: Secondary | ICD-10-CM

## 2014-05-04 LAB — LIPID PANEL
CHOL/HDL RATIO: 7
Cholesterol: 166 mg/dL (ref 0–200)
HDL: 24.5 mg/dL — ABNORMAL LOW (ref >39.00)
NonHDL: 141.5
VLDL: 87 mg/dL — AB (ref 0.0–40.0)

## 2014-05-04 LAB — CBC WITH DIFFERENTIAL/PLATELET
BASOS ABS: 0 10*3/uL (ref 0.0–0.1)
Basophils Relative: 0.4 % (ref 0.0–3.0)
Eosinophils Absolute: 0.4 10*3/uL (ref 0.0–0.7)
Eosinophils Relative: 4.4 % (ref 0.0–5.0)
HCT: 47.1 % (ref 39.0–52.0)
Hemoglobin: 15.9 g/dL (ref 13.0–17.0)
Lymphocytes Relative: 34.3 % (ref 12.0–46.0)
Lymphs Abs: 2.9 10*3/uL (ref 0.7–4.0)
MCHC: 33.7 g/dL (ref 30.0–36.0)
MCV: 82.8 fl (ref 78.0–100.0)
MONO ABS: 0.8 10*3/uL (ref 0.1–1.0)
Monocytes Relative: 9.1 % (ref 3.0–12.0)
NEUTROS PCT: 51.8 % (ref 43.0–77.0)
Neutro Abs: 4.3 10*3/uL (ref 1.4–7.7)
RBC: 5.69 Mil/uL (ref 4.22–5.81)
RDW: 14.5 % (ref 11.5–15.5)
WBC: 8.4 10*3/uL (ref 4.0–10.5)

## 2014-05-04 LAB — HEMOGLOBIN A1C: Hgb A1c MFr Bld: 7.6 % — ABNORMAL HIGH (ref 4.6–6.5)

## 2014-05-04 LAB — HEPATIC FUNCTION PANEL
ALT: 30 U/L (ref 0–53)
AST: 23 U/L (ref 0–37)
Albumin: 3.9 g/dL (ref 3.5–5.2)
Alkaline Phosphatase: 64 U/L (ref 39–117)
BILIRUBIN DIRECT: 0.1 mg/dL (ref 0.0–0.3)
BILIRUBIN TOTAL: 0.8 mg/dL (ref 0.2–1.2)
Total Protein: 6.6 g/dL (ref 6.0–8.3)

## 2014-05-04 LAB — POCT URINALYSIS DIPSTICK
Bilirubin, UA: NEGATIVE
Blood, UA: NEGATIVE
Ketones, UA: NEGATIVE
LEUKOCYTES UA: NEGATIVE
Nitrite, UA: NEGATIVE
PH UA: 5
Spec Grav, UA: 1.015
Urobilinogen, UA: 0.2

## 2014-05-04 LAB — TSH: TSH: 1.57 u[IU]/mL (ref 0.35–4.50)

## 2014-05-04 LAB — LDL CHOLESTEROL, DIRECT: Direct LDL: 74.9 mg/dL

## 2014-05-04 LAB — BASIC METABOLIC PANEL
BUN: 22 mg/dL (ref 6–23)
CHLORIDE: 102 meq/L (ref 96–112)
CO2: 27 mEq/L (ref 19–32)
CREATININE: 1.3 mg/dL (ref 0.4–1.5)
Calcium: 9.5 mg/dL (ref 8.4–10.5)
GFR: 56.31 mL/min — ABNORMAL LOW (ref >60.00)
GLUCOSE: 170 mg/dL — AB (ref 70–99)
Potassium: 4 mEq/L (ref 3.5–5.1)
Sodium: 138 mEq/L (ref 135–145)

## 2014-05-04 LAB — MICROALBUMIN / CREATININE URINE RATIO
Creatinine,U: 105.2 mg/dL
Microalb Creat Ratio: 2.9 mg/g (ref 0.0–30.0)
Microalb, Ur: 3.1 mg/dL — ABNORMAL HIGH (ref 0.0–1.9)

## 2014-05-04 LAB — PSA: PSA: 1.7 ng/mL (ref 0.10–4.00)

## 2014-05-11 ENCOUNTER — Ambulatory Visit (INDEPENDENT_AMBULATORY_CARE_PROVIDER_SITE_OTHER): Payer: Medicare Other | Admitting: Internal Medicine

## 2014-05-11 ENCOUNTER — Encounter: Payer: Self-pay | Admitting: Internal Medicine

## 2014-05-11 ENCOUNTER — Other Ambulatory Visit: Payer: Self-pay | Admitting: *Deleted

## 2014-05-11 VITALS — BP 140/78 | HR 72 | Temp 98.0°F | Resp 20 | Ht 72.0 in | Wt 231.0 lb

## 2014-05-11 DIAGNOSIS — E119 Type 2 diabetes mellitus without complications: Secondary | ICD-10-CM

## 2014-05-11 DIAGNOSIS — I480 Paroxysmal atrial fibrillation: Secondary | ICD-10-CM

## 2014-05-11 DIAGNOSIS — I4891 Unspecified atrial fibrillation: Secondary | ICD-10-CM

## 2014-05-11 DIAGNOSIS — Z0000 Encounter for general adult medical examination without abnormal findings: Secondary | ICD-10-CM

## 2014-05-11 DIAGNOSIS — Z8679 Personal history of other diseases of the circulatory system: Secondary | ICD-10-CM

## 2014-05-11 DIAGNOSIS — G609 Hereditary and idiopathic neuropathy, unspecified: Secondary | ICD-10-CM | POA: Diagnosis not present

## 2014-05-11 DIAGNOSIS — I10 Essential (primary) hypertension: Secondary | ICD-10-CM

## 2014-05-11 DIAGNOSIS — E785 Hyperlipidemia, unspecified: Secondary | ICD-10-CM | POA: Diagnosis not present

## 2014-05-11 DIAGNOSIS — R1011 Right upper quadrant pain: Secondary | ICD-10-CM

## 2014-05-11 DIAGNOSIS — I2510 Atherosclerotic heart disease of native coronary artery without angina pectoris: Secondary | ICD-10-CM | POA: Diagnosis not present

## 2014-05-11 MED ORDER — INSULIN LISPRO 200 UNIT/ML ~~LOC~~ SOPN: 40.0000 [IU] | PEN_INJECTOR | Freq: Three times a day (TID) | SUBCUTANEOUS | Status: DC

## 2014-05-11 NOTE — Progress Notes (Signed)
Subjective:    Patient ID: Jimmy Carr, male    DOB: 07/29/1941, 73 y.o.   MRN: [id number]  HPI   73 year old patient who is seen today for a wellness exam. Medical problems include diabetes hypertension dyslipidemia cerebrovascular disease. He has a history of a peripheral neuropathy.  Patient is on Lantus insulin 50 units at bedtime.  Fasting blood sugars are generally 120-140, but he occasionally has fasting blood sugars as low as 70.  He has 2 meals daily and is on Humalog 40 units prior to meals.  He has an additional 5 units if blood sugar is elevated.  Recent hemoglobin A1c 7 point 6  He has a remote history of paroxysmal atrial fibrillation.  Remains on high dose Lanoxin therapy  Medical issues included treated hypertension and dyslipidemia.  Diabetic complications include peripheral neuropathy   Current Allergies:  ! VERAPAMIL   Past Medical History:  Diabetes mellitus, type II  Hyperlipidemia  Hypertension  Peripheral neuropathy  Cerebrovascular accident, hx of small vessel lacunar stroke 2003  Cardiac dysrhythmias; PSVT  glaucoma   Past Surgical History:  Reviewed history from 02/24/2007 and no changes required:  Anal fissure  cardiac catheterization 2002  colonoscopy December 2007  carotid duplex evaluation 2003  Cardiolite stress test 2007   Family History:  Reviewed history from 11/03/2007 and no changes required:  details of his father's health unknown  mother died in her mid 26s; history of cervical cancer, died of complications of pneumonia   Social History:  Married  Never Smoked  Risk Factors:  Tobacco use: never   Past Medical History  Diagnosis Date  . Diabetes mellitus type II   . Hyperlipidemia   . Hypertension   . Peripheral neuropathy   . Cerebrovascular accident 2003    history of small vessal lacunar stroke 2003  . Glaucoma   . Other specified cardiac dysrhythmias(427.89)     PSVT  . AF (atrial fibrillation)     2D ECHO,  03/01/1987 - normal; NUCLEAR STRESS TEST, 04/10/2006 - EF 65%, no ischemia  . Bruit     CAROTID DOPPLER, 09/20/2008 - normal, no evidence of diameter reduction, significant tortuousity or any other vascular abnormality    History   Social History  . Marital Status: Married    Spouse Name: N/A    Number of Children: N/A  . Years of Education: N/A   Occupational History  . Not on file.   Social History Main Topics  . Smoking status: Never Smoker   . Smokeless tobacco: Never Used  . Alcohol Use: No  . Drug Use: No  . Sexual Activity: Not on file   Other Topics Concern  . Not on file   Social History Narrative   Non smoker    On disability    Past Surgical History  Procedure Laterality Date  . Anal fissure repair    . Cardiac catheterization  02/02/2001    Normal LV systolic function, questionable mitral valve prolapse without mitral regurgitation, no evidence of CAD  . Cardiac catheterization  09/13/2008    Minimal CAD, normal LV systolic function, no evidence of renal artery stenosis, distal aortic disease, or iliac disease    History reviewed. No pertinent family history.  Allergies  Allergen Reactions  . Cheratussin Ac [Guaifenesin-Codeine] Nausea And Vomiting  . Verapamil     Current Outpatient Prescriptions on File Prior to Visit  Medication Sig Dispense Refill  . aspirin (ASPIRIN EC) 81 MG EC tablet Take 81 mg  by mouth daily.        . digoxin (LANOXIN) 0.25 MG tablet Take 1 tablet (0.25 mg total) by mouth daily.  90 tablet  3  . glimepiride (AMARYL) 4 MG tablet Take 1 tablet (4 mg total) by mouth daily before breakfast.  90 tablet  1  . glucose blood (FREESTYLE TEST STRIPS) test strip 1 each by Other route 3 (three) times daily as needed for other.  100 each  12  . hydrochlorothiazide (HYDRODIURIL) 25 MG tablet Take 1 tablet (25 mg total) by mouth daily.  90 tablet  3  . Insulin Glargine (LANTUS SOLOSTAR) 100 UNIT/ML Solostar Pen INJECT 50 UNITS AT BEDTIME  9 mL   5  . insulin lispro (HUMALOG KWIKPEN) 100 UNIT/ML KiwkPen INJECT 40 UNITS INTO THE SKIN 3 (THREE) TIMES DAILY BEFORE MEALS.  If blood sugar prior to a meal is greater than 180 add an additional 10 units  40 pen  1  . Insulin Pen Needle 32G X 4 MM MISC 1 each by Does not apply route 4 (four) times daily -  before meals and at bedtime.  100 each  12  . latanoprost (XALATAN) 0.005 % ophthalmic solution Place 1 drop into both eyes daily.       Marland Kitchen lisinopril (PRINIVIL,ZESTRIL) 40 MG tablet Take 1 tablet (40 mg total) by mouth daily.  90 tablet  1  . lovastatin (MEVACOR) 40 MG tablet Take 1 tablet (40 mg total) by mouth at bedtime.  90 tablet  1  . metFORMIN (GLUCOPHAGE) 1000 MG tablet Take 1 tablet (1,000 mg total) by mouth 2 (two) times daily.  180 tablet  1  . naproxen sodium (ALEVE) 220 MG tablet Take 1 tablet (220 mg total) by mouth 2 (two) times daily as needed.      . timolol (TIMOPTIC-XR) 0.5 % ophthalmic gel-forming Place 1 drop into both eyes daily.        No current facility-administered medications on file prior to visit.    BP 140/78  Pulse 72  Temp(Src) 98 F (36.7 C) (Oral)  Resp 20  Ht 6' (1.829 m)  Wt 231 lb (104.781 kg)  BMI 31.32 kg/m2  SpO2 98%   1. Risk factors, based on past  M,S,F history- cardiovascular as factors include hypertension diabetes dyslipidemia. Also has a history of cerebrovascular disease  2.  Physical activities: Remains active. No activity restrictions  3.  Depression/mood: No history depression or mood disorder  4.  Hearing: No deficits  5.  ADL's: Independent aspects of daily living  6.  Fall risk: Low  7.  Home safety: No problems identified  8.  Height weight, and visual acuity; height and weight stable no change in visual acuity. He is followed by ophthalmology  9.  Counseling: Regular exercise heart healthy diet all encouraged 10. Lab orders based on risk factors: Laboratory profile including lipid panel will be reviewed  11. Referral  : Followup ophthalmology  12. Care plan: Modest weight loss regular exercise encouraged  13. Cognitive assessment: Alert and oriented with normal affect. No cognitive dysfunction     Review of Systems  Constitutional: Negative for fever, chills, appetite change and fatigue.  HENT: Negative for congestion, dental problem, ear pain, hearing loss, sore throat, tinnitus, trouble swallowing and voice change.   Eyes: Negative for pain, discharge and visual disturbance.  Respiratory: Negative for cough, chest tightness, wheezing and stridor.   Cardiovascular: Negative for chest pain, palpitations and leg swelling.  Gastrointestinal: Negative  for nausea, vomiting, abdominal pain, diarrhea, constipation, blood in stool and abdominal distention.  Genitourinary: Negative for urgency, hematuria, flank pain, discharge, difficulty urinating and genital sores.  Musculoskeletal: Negative for arthralgias, back pain, gait problem, joint swelling, myalgias and neck stiffness.  Skin: Negative for rash.  Neurological: Positive for numbness. Negative for dizziness, syncope, speech difficulty, weakness and headaches.  Hematological: Negative for adenopathy. Does not bruise/bleed easily.  Psychiatric/Behavioral: Negative for behavioral problems and dysphoric mood. The patient is not nervous/anxious.        Objective:   Physical Exam  Constitutional: He appears well-developed and well-nourished.  HENT:  Head: Normocephalic and atraumatic.  Right Ear: External ear normal.  Left Ear: External ear normal.  Nose: Nose normal.  Mouth/Throat: Oropharynx is clear and moist.  Eyes: Conjunctivae and EOM are normal. Pupils are equal, round, and reactive to light. No scleral icterus.  Neck: Normal range of motion. Neck supple. No JVD present. No thyromegaly present.  Cardiovascular: Regular rhythm, normal heart sounds and intact distal pulses.  Exam reveals no gallop and no friction rub.   No murmur  heard. Pulmonary/Chest: Effort normal and breath sounds normal. He exhibits no tenderness.  Abdominal: Soft. Bowel sounds are normal. He exhibits no distension and no mass. There is no tenderness.  Genitourinary: Penis normal.  Prostate +2 enlarged smooth and symmetrical  Musculoskeletal: Normal range of motion. He exhibits no edema and no tenderness.  Lymphadenopathy:    He has no cervical adenopathy.  Neurological: He is alert. He has normal reflexes. No cranial nerve deficit. Coordination normal.  Skin: Skin is warm and dry. No rash noted.  Psychiatric: He has a normal mood and affect. His behavior is normal.          Assessment & Plan:   Preventive health exam Diabetes mellitus, complicated by peripheral neuropathy Hypertension stable Dyslipidemia.  Continue statin therapy History of paroxysmal atrial fibrillation.  Discontinue Lanoxin  Lifestyle issues discussed Recheck 3 months

## 2014-05-11 NOTE — Progress Notes (Signed)
Pre visit review using our clinic review tool, if applicable. No additional management support is needed unless otherwise documented below in the visit note. 

## 2014-05-11 NOTE — Patient Instructions (Signed)
Limit your sodium (Salt) intake   Please check your hemoglobin A1c every 3 months    It is important that you exercise regularly, at least 20 minutes 3 to 4 times per week.  If you develop chest pain or shortness of breath seek  medical attention.  You need to lose weight.  Consider a lower calorie diet and regular exercise. 

## 2014-05-20 ENCOUNTER — Other Ambulatory Visit: Payer: Self-pay | Admitting: Dermatology

## 2014-05-20 DIAGNOSIS — L570 Actinic keratosis: Secondary | ICD-10-CM | POA: Diagnosis not present

## 2014-05-20 DIAGNOSIS — D239 Other benign neoplasm of skin, unspecified: Secondary | ICD-10-CM | POA: Diagnosis not present

## 2014-05-20 DIAGNOSIS — L738 Other specified follicular disorders: Secondary | ICD-10-CM | POA: Diagnosis not present

## 2014-05-20 DIAGNOSIS — L918 Other hypertrophic disorders of the skin: Secondary | ICD-10-CM | POA: Diagnosis not present

## 2014-05-20 DIAGNOSIS — D0460 Carcinoma in situ of skin of unspecified upper limb, including shoulder: Secondary | ICD-10-CM | POA: Diagnosis not present

## 2014-05-20 DIAGNOSIS — L821 Other seborrheic keratosis: Secondary | ICD-10-CM | POA: Diagnosis not present

## 2014-05-20 DIAGNOSIS — L908 Other atrophic disorders of skin: Secondary | ICD-10-CM | POA: Diagnosis not present

## 2014-06-09 ENCOUNTER — Other Ambulatory Visit: Payer: Self-pay | Admitting: *Deleted

## 2014-06-09 MED ORDER — GLIMEPIRIDE 4 MG PO TABS: 4.0000 mg | ORAL_TABLET | Freq: Every day | ORAL | Status: DC

## 2014-06-09 MED ORDER — METFORMIN HCL 1000 MG PO TABS: 1000.0000 mg | ORAL_TABLET | Freq: Two times a day (BID) | ORAL | Status: DC

## 2014-06-14 ENCOUNTER — Other Ambulatory Visit: Payer: Self-pay | Admitting: *Deleted

## 2014-06-14 MED ORDER — LOVASTATIN 40 MG PO TABS: 40.0000 mg | ORAL_TABLET | Freq: Every day | ORAL | Status: DC

## 2014-06-14 MED ORDER — LISINOPRIL 40 MG PO TABS: 40.0000 mg | ORAL_TABLET | Freq: Every day | ORAL | Status: DC

## 2014-06-16 DIAGNOSIS — Z85828 Personal history of other malignant neoplasm of skin: Secondary | ICD-10-CM | POA: Diagnosis not present

## 2014-07-07 ENCOUNTER — Encounter: Payer: Self-pay | Admitting: Internal Medicine

## 2014-07-07 ENCOUNTER — Ambulatory Visit (INDEPENDENT_AMBULATORY_CARE_PROVIDER_SITE_OTHER): Payer: Medicare Other | Admitting: Internal Medicine

## 2014-07-07 VITALS — BP 124/80 | HR 70 | Ht 72.0 in | Wt 230.0 lb

## 2014-07-07 DIAGNOSIS — E785 Hyperlipidemia, unspecified: Secondary | ICD-10-CM

## 2014-07-07 DIAGNOSIS — I480 Paroxysmal atrial fibrillation: Secondary | ICD-10-CM

## 2014-07-07 DIAGNOSIS — I2583 Coronary atherosclerosis due to lipid rich plaque: Principal | ICD-10-CM

## 2014-07-07 DIAGNOSIS — I10 Essential (primary) hypertension: Secondary | ICD-10-CM

## 2014-07-07 DIAGNOSIS — I2510 Atherosclerotic heart disease of native coronary artery without angina pectoris: Secondary | ICD-10-CM

## 2014-07-07 NOTE — Progress Notes (Signed)
OFFICE NOTE  Chief Complaint:  Routine follow-up  Primary Care Physician: Nyoka Cowden, MD  HPI:  Jimmy Carr is a 73 year-old male with a history of PAF. His atrial fib actually started in the mid 80s. He was on Coumadin, beta blockers, and Lanoxin, and over the years, we have been able to wean him off his beta blockers. He is still on a low dose of Lanoxin and several years ago, we stopped the Coumadin, and his CHADS score at that time was 2, but with no atrial fibrillation in over 10 years, we stopped the Coumadin. He is on aspirin. He has had no recurrence of any atrial fib in over 15 years. He has no palpitations. No unusual shortness of breath. No chest pain. No exertional dyspnea. Minimal puffiness of his ankles at the end of the day. No dizziness, slurred speech, or blurred vision.  He is diabetic and has been so for over 30 years. His last hemoglobin A1c was 7.2. He now has a peripheral neuropathy that involves his feet, much greater than his hands. He had been tried on Neurontin with no real help. He checks his blood pressure at home. It has been under good control. He denies arthralgias or myalgias. He walks daily for at least 30 minutes, five to seven days a week. With this, he has no claudication, no unusual breathlessness. He has had no angina. He has diffuse T wave inversion on his EKG. He was catheterized in December 2009 that showed minimal coronary disease and normal LV function. He has no real complaints today, other than the increased cost of digoxin. Exactly unclear to me why he is on digoxin, as this medicine is unlikely to keep him out of atrial fibrillation. He does have a high CHADS2 score which is 4, despite in frequent atrial fibrillation. He continues on aspirin.  There are no new complaints of chest pain or worsening shortness of breath.  Mr. Schoen recently saw [name] in the office for what he thought was atypical chest pain. He recommended a  nonsteroidal. He is also reported more recent palpitations. He's been under significant stress recently. Today he called the office several times and spoke with our triage nurse about concern for atrial fibrillation. I did advise him that he may need to come in for an EKG to see if is actually in atrial fibrillation. Ultimately he did decide to do that.  Mr. Gullett returns today and is doing well. He is asymptomatic and has no more palpitations. We discussed to monitor his last visit if his symptoms did not improve but they seem to have improved. Overall he feels like he did well. He denies any chest pain. He reports good blood sugar control with his diabetes.  PMHx:  Past Medical History  Diagnosis Date  . Diabetes mellitus type II   . Hyperlipidemia   . Hypertension   . Peripheral neuropathy   . Cerebrovascular accident 2003    history of small vessal lacunar stroke 2003  . Glaucoma   . Other specified cardiac dysrhythmias(427.89)     PSVT  . AF (atrial fibrillation)     2D ECHO, 03/01/1987 - normal; NUCLEAR STRESS TEST, 04/10/2006 - EF 65%, no ischemia  . Bruit     CAROTID DOPPLER, 09/20/2008 - normal, no evidence of diameter reduction, significant tortuousity or any other vascular abnormality    Past Surgical History  Procedure Laterality Date  . Anal fissure repair    . Cardiac catheterization  02/02/2001    Normal LV systolic function, questionable mitral valve prolapse without mitral regurgitation, no evidence of CAD  . Cardiac catheterization  09/13/2008    Minimal CAD, normal LV systolic function, no evidence of renal artery stenosis, distal aortic disease, or iliac disease    FAMHx:  History reviewed. No pertinent family history.  SOCHx:   reports that he has never smoked. He has never used smokeless tobacco. He reports that he does not drink alcohol or use illicit drugs.  ALLERGIES:  Allergies  Allergen Reactions  . Cheratussin Ac [Guaifenesin-Codeine] Nausea And  Vomiting  . Verapamil     ROS: A comprehensive review of systems was negative.  HOME MEDS: Current Outpatient Prescriptions  Medication Sig Dispense Refill  . aspirin (ASPIRIN EC) 81 MG EC tablet Take 81 mg by mouth daily.        . digoxin (LANOXIN) 0.25 MG tablet Take 1 tablet (0.25 mg total) by mouth daily.  90 tablet  3  . glimepiride (AMARYL) 4 MG tablet Take 1 tablet (4 mg total) by mouth daily before breakfast.  90 tablet  1  . glucose blood (FREESTYLE TEST STRIPS) test strip 1 each by Other route 3 (three) times daily as needed for other.  100 each  12  . hydrochlorothiazide (HYDRODIURIL) 25 MG tablet Take 1 tablet (25 mg total) by mouth daily.  90 tablet  3  . Insulin Glargine (LANTUS SOLOSTAR) 100 UNIT/ML Solostar Pen INJECT 50 UNITS AT BEDTIME  9 mL  5  . insulin lispro (HUMALOG KWIKPEN) 100 UNIT/ML KiwkPen INJECT 40 UNITS INTO THE SKIN 3 (THREE) TIMES DAILY BEFORE MEALS.  If blood sugar prior to a meal is greater than 180 add an additional 10 units  40 pen  1  . Insulin Pen Needle 32G X 4 MM MISC 1 each by Does not apply route 4 (four) times daily -  before meals and at bedtime.  100 each  12  . latanoprost (XALATAN) 0.005 % ophthalmic solution Place 1 drop into both eyes daily.       Marland Kitchen lisinopril (PRINIVIL,ZESTRIL) 40 MG tablet Take 1 tablet (40 mg total) by mouth daily.  90 tablet  1  . lovastatin (MEVACOR) 40 MG tablet Take 1 tablet (40 mg total) by mouth at bedtime.  90 tablet  1  . metFORMIN (GLUCOPHAGE) 1000 MG tablet Take 1 tablet (1,000 mg total) by mouth 2 (two) times daily.  180 tablet  1  . timolol (TIMOPTIC-XR) 0.5 % ophthalmic gel-forming Place 1 drop into both eyes daily.        No current facility-administered medications for this visit.    LABS/IMAGING: No results found for this or any previous visit (from the past 48 hour(s)). No results found.  VITALS: BP 124/80  Pulse 70  Ht 6' (1.829 m)  Wt 230 lb (104.327 kg)  BMI 31.19 kg/m2  EXAM: General  appearance: alert and no distress Neck: no carotid bruit and no JVD Lungs: clear to auscultation bilaterally Heart: regular rate and rhythm, S1, S2 normal, no murmur, click, rub or gallop Abdomen: soft, non-tender; bowel sounds normal; no masses,  no organomegaly Extremities: extremities normal, atraumatic, no cyanosis or edema Pulses: 2+ and symmetric Skin: Skin color, texture, turgor normal. No rashes or lesions Neurologic: Grossly normal Psych: Normal  EKG: Normal sinus rhythm at 70, first degree AV block, anterolateral T-wave changes which are old  ASSESSMENT: 1. Paroxysmal atrial fibrillation 2. Hypertension 3. History of stroke, with right central  vision loss 4. Type 2 diabetes on insulin 5. Dyslipidemia  PLAN: 1.   Mr. Werth is asymptomatic at this time. He reports his palpitations have resolved. His blood pressure is well-controlled. His cholesterol is at goal and followed by his primary care provider. His diabetes is much improved and also followed by his primary care provider. Overall he is doing well and encouraged him to continue to get exercise and will plan to see him back in 6 months or sooner as necessary.  Pixie Casino, MD, [facility] Attending Cardiologist [facility] 07/07/2014, 11:28 AM

## 2014-07-07 NOTE — Patient Instructions (Signed)
Your physician wants you to follow-up in: 6 months with Dr. Hilty. You will receive a reminder letter in the mail two months in advance. If you don't receive a letter, please call our office to schedule the follow-up appointment.    

## 2014-07-11 DIAGNOSIS — L570 Actinic keratosis: Secondary | ICD-10-CM | POA: Diagnosis not present

## 2014-07-11 DIAGNOSIS — Z85828 Personal history of other malignant neoplasm of skin: Secondary | ICD-10-CM | POA: Diagnosis not present

## 2014-07-11 DIAGNOSIS — L821 Other seborrheic keratosis: Secondary | ICD-10-CM | POA: Diagnosis not present

## 2014-08-23 ENCOUNTER — Other Ambulatory Visit: Payer: Self-pay | Admitting: Internal Medicine

## 2014-08-23 MED ORDER — DIGOXIN 250 MCG PO TABS: 0.2500 mg | ORAL_TABLET | Freq: Every day | ORAL | Status: DC

## 2014-08-23 MED ORDER — HYDROCHLOROTHIAZIDE 25 MG PO TABS: 25.0000 mg | ORAL_TABLET | Freq: Every day | ORAL | Status: DC

## 2014-08-23 NOTE — Telephone Encounter (Signed)
Rx sent to pharmacy   

## 2014-08-23 NOTE — Telephone Encounter (Signed)
[facility] - [place], [place] [phone] is requesting re-fills on hydrochlorothiazide (HYDRODIURIL) 25 MG tablet(Expired) and digoxin (LANOXIN) 0.25 MG tablet(Expired)

## 2014-08-24 DIAGNOSIS — H4011X3 Primary open-angle glaucoma, severe stage: Secondary | ICD-10-CM | POA: Diagnosis not present

## 2014-09-05 ENCOUNTER — Telehealth: Payer: Self-pay | Admitting: Internal Medicine

## 2014-09-05 NOTE — Telephone Encounter (Signed)
Pt request refill insulin lispro (HUMALOG KWIKPEN) 100 UNIT/ML KiwkPen And  Insulin Glargine (LANTUS SOLOSTAR) 100 UNIT/ML Solostar Pen [facility] [place]

## 2014-09-06 MED ORDER — INSULIN GLARGINE 100 UNIT/ML SOLOSTAR PEN: 50.0000 [IU] | PEN_INJECTOR | Freq: Every day | SUBCUTANEOUS | Status: DC

## 2014-09-06 MED ORDER — INSULIN LISPRO 100 UNIT/ML (KWIKPEN): PEN_INJECTOR | SUBCUTANEOUS | Status: DC

## 2014-09-06 NOTE — Telephone Encounter (Signed)
Spoke to pt, told him Rx's for insulin was sent to pharmacy. Need to schedule physical in February. Pt verbalized understanding.

## 2014-09-08 ENCOUNTER — Encounter: Payer: Self-pay | Admitting: Family Medicine

## 2014-09-08 ENCOUNTER — Ambulatory Visit (INDEPENDENT_AMBULATORY_CARE_PROVIDER_SITE_OTHER): Payer: Medicare Other | Admitting: Family Medicine

## 2014-09-08 VITALS — BP 122/70 | HR 87 | Temp 98.1°F | Ht 72.0 in | Wt 229.8 lb

## 2014-09-08 DIAGNOSIS — I2510 Atherosclerotic heart disease of native coronary artery without angina pectoris: Secondary | ICD-10-CM | POA: Diagnosis not present

## 2014-09-08 DIAGNOSIS — J069 Acute upper respiratory infection, unspecified: Secondary | ICD-10-CM | POA: Diagnosis not present

## 2014-09-08 DIAGNOSIS — J029 Acute pharyngitis, unspecified: Secondary | ICD-10-CM | POA: Diagnosis not present

## 2014-09-08 LAB — POCT RAPID STREP A (OFFICE): RAPID STREP A SCREEN: NEGATIVE

## 2014-09-08 NOTE — Progress Notes (Signed)
HPI:  Sore throat: -started: 1 week -symptoms:nasal congestion, sore throat, cough, sinus pressure initially, now with cough and sore throat -denies:fever, SOB, NVD, tooth pain, sinus pain -has tried: nothing -sick contacts/travel/risks: denies flu exposure, tick exposure or or Ebola risks, strep exposure -Hx of: strep throat - he wants to check for this  ROS: See pertinent positives and negatives per HPI.  Past Medical History  Diagnosis Date  . Diabetes mellitus type II   . Hyperlipidemia   . Hypertension   . Peripheral neuropathy   . Cerebrovascular accident 2003    history of small vessal lacunar stroke 2003  . Glaucoma   . Other specified cardiac dysrhythmias(427.89)     PSVT  . AF (atrial fibrillation)     2D ECHO, 03/01/1987 - normal; NUCLEAR STRESS TEST, 04/10/2006 - EF 65%, no ischemia  . Bruit     CAROTID DOPPLER, 09/20/2008 - normal, no evidence of diameter reduction, significant tortuousity or any other vascular abnormality    Past Surgical History  Procedure Laterality Date  . Anal fissure repair    . Cardiac catheterization  02/02/2001    Normal LV systolic function, questionable mitral valve prolapse without mitral regurgitation, no evidence of CAD  . Cardiac catheterization  09/13/2008    Minimal CAD, normal LV systolic function, no evidence of renal artery stenosis, distal aortic disease, or iliac disease    No family history on file.  History   Social History  . Marital Status: Married    Spouse Name: N/A    Number of Children: N/A  . Years of Education: N/A   Social History Main Topics  . Smoking status: Never Smoker   . Smokeless tobacco: Never Used  . Alcohol Use: No  . Drug Use: No  . Sexual Activity: None   Other Topics Concern  . None   Social History Narrative   Non smoker    On disability    Current outpatient prescriptions: aspirin (ASPIRIN EC) 81 MG EC tablet, Take 81 mg by mouth daily.  , Disp: , Rfl: ;  digoxin (LANOXIN) 0.25  MG tablet, Take 1 tablet (0.25 mg total) by mouth daily., Disp: 90 tablet, Rfl: 3;  glimepiride (AMARYL) 4 MG tablet, Take 1 tablet (4 mg total) by mouth daily before breakfast., Disp: 90 tablet, Rfl: 1 glucose blood (FREESTYLE TEST STRIPS) test strip, 1 each by Other route 3 (three) times daily as needed for other., Disp: 100 each, Rfl: 12;  hydrochlorothiazide (HYDRODIURIL) 25 MG tablet, Take 1 tablet (25 mg total) by mouth daily., Disp: 90 tablet, Rfl: 3;  Insulin Glargine (LANTUS SOLOSTAR) 100 UNIT/ML Solostar Pen, Inject 50 Units into the skin daily at 10 pm., Disp: 20 pen, Rfl: 1 insulin lispro (HUMALOG KWIKPEN) 100 UNIT/ML KiwkPen, INJECT 40 UNITS INTO THE SKIN THREE TIMES DAILY BEFORE MEALS.If blood sugar prior to a meal is greater than 180 add an additional 10 units, Disp: 50 pen, Rfl: 1;  Insulin Pen Needle 32G X 4 MM MISC, 1 each by Does not apply route 4 (four) times daily -  before meals and at bedtime., Disp: 100 each, Rfl: 12 latanoprost (XALATAN) 0.005 % ophthalmic solution, Place 1 drop into both eyes daily. , Disp: , Rfl: ;  lisinopril (PRINIVIL,ZESTRIL) 40 MG tablet, Take 1 tablet (40 mg total) by mouth daily., Disp: 90 tablet, Rfl: 1;  lovastatin (MEVACOR) 40 MG tablet, Take 1 tablet (40 mg total) by mouth at bedtime., Disp: 90 tablet, Rfl: 1 metFORMIN (GLUCOPHAGE) 1000  MG tablet, Take 1 tablet (1,000 mg total) by mouth 2 (two) times daily., Disp: 180 tablet, Rfl: 1;  timolol (TIMOPTIC-XR) 0.5 % ophthalmic gel-forming, Place 1 drop into both eyes daily. , Disp: , Rfl:   EXAM:  Filed Vitals:   09/08/14 1422  BP: 122/70  Pulse: 87  Temp: 98.1 F (36.7 C)    Body mass index is 31.16 kg/(m^2).  GENERAL: vitals reviewed and listed above, alert, oriented, appears well hydrated and in no acute distress  HEENT: atraumatic, conjunttiva clear, no obvious abnormalities on inspection of external nose and ears, normal appearance of ear canals and TMs, clear nasal congestion, mild post  oropharyngeal erythema with PND, no tonsillar edema or exudate, no sinus TTP  NECK: no obvious masses on inspection  LUNGS: clear to auscultation bilaterally, no wheezes, rales or rhonchi, good air movement  CV: HRRR, no peripheral edema  MS: moves all extremities without noticeable abnormality  PSYCH: pleasant and cooperative, no obvious depression or anxiety  ASSESSMENT AND PLAN:  Discussed the following assessment and plan:  Sore throat  Acute upper respiratory infection  -given HPI and exam findings today, a serious infection or illness is unlikely. We discussed potential etiologies, with VURI being most likely, and advised supportive care and monitoring. We discussed treatment side effects, likely course, antibiotic misuse, transmission, and signs of developing a serious illness. -rapid strep given his concerns, but symptoms and exam do not suggest this -of course, we advised to return or notify a doctor immediately if symptoms worsen or persist or new concerns arise.    Patient Instructions  INSTRUCTIONS FOR UPPER RESPIRATORY INFECTION:  -plenty of rest and fluids  -nasal saline wash 2-3 times daily (use prepackaged nasal saline or bottled/distilled water if making your own)   -clean nose with nasal saline before using the nasal steroid or sinex  -can use AFRIN nasal spray for drainage and nasal congestion - but do NOT use longer then 3-4 days  -can use tylenol or ibuprofen as directed for aches and sorethroat  -in the winter time, using a humidifier at night is helpful (please follow cleaning instructions)  -if you are taking a cough medication - use only as directed, may also try a teaspoon of honey to coat the throat and throat lozenges  -for sore throat, salt water gargles can help  -follow up if you have fevers, facial pain, tooth pain, difficulty breathing or are worsening or not getting better in 5-7 days      KIM, HANNAH R.

## 2014-09-08 NOTE — Addendum Note (Signed)
Addended by: Agnes Lawrence on: 09/08/2014 02:43 PM   Modules accepted: Orders

## 2014-09-08 NOTE — Patient Instructions (Signed)
INSTRUCTIONS FOR UPPER RESPIRATORY INFECTION:  -plenty of rest and fluids  -nasal saline wash 2-3 times daily (use prepackaged nasal saline or bottled/distilled water if making your own)   -clean nose with nasal saline before using the nasal steroid or sinex  -can use AFRIN nasal spray for drainage and nasal congestion - but do NOT use longer then 3-4 days  -can use tylenol or ibuprofen as directed for aches and sorethroat  -in the winter time, using a humidifier at night is helpful (please follow cleaning instructions)  -if you are taking a cough medication - use only as directed, may also try a teaspoon of honey to coat the throat and throat lozenges  -for sore throat, salt water gargles can help  -follow up if you have fevers, facial pain, tooth pain, difficulty breathing or are worsening or not getting better in 5-7 days  

## 2014-09-08 NOTE — Progress Notes (Signed)
Pre visit review using our clinic review tool, if applicable. No additional management support is needed unless otherwise documented below in the visit note. 

## 2014-10-03 DIAGNOSIS — H4011X3 Primary open-angle glaucoma, severe stage: Secondary | ICD-10-CM | POA: Diagnosis not present

## 2014-10-03 DIAGNOSIS — H2513 Age-related nuclear cataract, bilateral: Secondary | ICD-10-CM | POA: Diagnosis not present

## 2014-10-04 DIAGNOSIS — H2513 Age-related nuclear cataract, bilateral: Secondary | ICD-10-CM | POA: Insufficient documentation

## 2014-10-04 DIAGNOSIS — IMO0002 Reserved for concepts with insufficient information to code with codable children: Secondary | ICD-10-CM | POA: Insufficient documentation

## 2014-11-02 ENCOUNTER — Other Ambulatory Visit: Payer: Self-pay | Admitting: Internal Medicine

## 2014-11-14 ENCOUNTER — Other Ambulatory Visit: Payer: Self-pay | Admitting: Internal Medicine

## 2014-11-18 DIAGNOSIS — Z85828 Personal history of other malignant neoplasm of skin: Secondary | ICD-10-CM | POA: Diagnosis not present

## 2014-11-18 DIAGNOSIS — L718 Other rosacea: Secondary | ICD-10-CM | POA: Diagnosis not present

## 2014-11-18 DIAGNOSIS — D225 Melanocytic nevi of trunk: Secondary | ICD-10-CM | POA: Diagnosis not present

## 2014-11-18 DIAGNOSIS — L570 Actinic keratosis: Secondary | ICD-10-CM | POA: Diagnosis not present

## 2014-11-18 DIAGNOSIS — L821 Other seborrheic keratosis: Secondary | ICD-10-CM | POA: Diagnosis not present

## 2014-12-02 ENCOUNTER — Ambulatory Visit (INDEPENDENT_AMBULATORY_CARE_PROVIDER_SITE_OTHER): Payer: Medicare Other | Admitting: Internal Medicine

## 2014-12-02 ENCOUNTER — Encounter: Payer: Self-pay | Admitting: Internal Medicine

## 2014-12-02 DIAGNOSIS — M25512 Pain in left shoulder: Secondary | ICD-10-CM

## 2014-12-02 DIAGNOSIS — I480 Paroxysmal atrial fibrillation: Secondary | ICD-10-CM

## 2014-12-02 DIAGNOSIS — E0840 Diabetes mellitus due to underlying condition with diabetic neuropathy, unspecified: Secondary | ICD-10-CM

## 2014-12-02 DIAGNOSIS — I10 Essential (primary) hypertension: Secondary | ICD-10-CM | POA: Diagnosis not present

## 2014-12-02 LAB — HEMOGLOBIN A1C: HEMOGLOBIN A1C: 7.8 % — AB (ref 4.6–6.5)

## 2014-12-02 MED ORDER — INSULIN LISPRO 100 UNIT/ML (KWIKPEN): 50.0000 [IU] | PEN_INJECTOR | Freq: Three times a day (TID) | SUBCUTANEOUS | Status: DC

## 2014-12-02 MED ORDER — INSULIN LISPRO 100 UNIT/ML CARTRIDGE: SUBCUTANEOUS | Status: DC

## 2014-12-02 MED ORDER — INSULIN GLARGINE 100 UNIT/ML SOLOSTAR PEN: 50.0000 [IU] | PEN_INJECTOR | Freq: Every day | SUBCUTANEOUS | Status: DC

## 2014-12-02 MED ORDER — GLUCOSE BLOOD VI STRP: 1.0000 | ORAL_STRIP | Freq: Three times a day (TID) | Status: DC | PRN

## 2014-12-02 MED ORDER — INSULIN LISPRO 100 UNIT/ML (KWIKPEN): PEN_INJECTOR | SUBCUTANEOUS | Status: DC

## 2014-12-02 MED ORDER — METHYLPREDNISOLONE ACETATE 80 MG/ML IJ SUSP
80.0000 mg | Freq: Once | INTRAMUSCULAR | Status: AC
  Administered 2014-12-02: 80 mg via INTRAMUSCULAR

## 2014-12-02 MED ORDER — INSULIN PEN NEEDLE 32G X 4 MM MISC: 1.0000 | Freq: Three times a day (TID) | Status: DC

## 2014-12-02 NOTE — Progress Notes (Signed)
Pre visit review using our clinic review tool, if applicable. No additional management support is needed unless otherwise documented below in the visit note. 

## 2014-12-02 NOTE — Progress Notes (Signed)
Subjective:    Patient ID: Jimmy Carr, male    DOB: 06-04-1941, 74 y.o.   MRN: [id number]  HPI  74 year old patient who has a history of diabetes complicated by neuropathy.  Complaints include the left shoulder pain.  This has been the present for about 4 weeks and more recently has intensified.  He states the pain initially started in the left upper posterior back area and then involve the shoulder area.  More recently it seems to be present involving the left upper arm area with some radiation as distal as the left hand.  Describes the pain as a dull ache.  No real provocative factors. He has diabetes.  He is on Lantus insulin 50 units at bedtime.  He states fasting blood sugars are generally in the 100-150.  He is also on mealtime insulin 50 units prior to each meal.  Last hemoglobin A1c was 7.6 He has been seen recently by cardiology and ophthalmology  Past Medical History  Diagnosis Date  . Diabetes mellitus type II   . Hyperlipidemia   . Hypertension   . Peripheral neuropathy   . Cerebrovascular accident 2003    history of small vessal lacunar stroke 2003  . Glaucoma   . Other specified cardiac dysrhythmias(427.89)     PSVT  . AF (atrial fibrillation)     2D ECHO, 03/01/1987 - normal; NUCLEAR STRESS TEST, 04/10/2006 - EF 65%, no ischemia  . Bruit     CAROTID DOPPLER, 09/20/2008 - normal, no evidence of diameter reduction, significant tortuousity or any other vascular abnormality    History   Social History  . Marital Status: Married    Spouse Name: N/A  . Number of Children: N/A  . Years of Education: N/A   Occupational History  . Not on file.   Social History Main Topics  . Smoking status: Never Smoker   . Smokeless tobacco: Never Used  . Alcohol Use: No  . Drug Use: No  . Sexual Activity: Not on file   Other Topics Concern  . Not on file   Social History Narrative   Non smoker    On disability    Past Surgical History  Procedure Laterality Date  .  Anal fissure repair    . Cardiac catheterization  02/02/2001    Normal LV systolic function, questionable mitral valve prolapse without mitral regurgitation, no evidence of CAD  . Cardiac catheterization  09/13/2008    Minimal CAD, normal LV systolic function, no evidence of renal artery stenosis, distal aortic disease, or iliac disease    No family history on file.  Allergies  Allergen Reactions  . Cheratussin Ac [Guaifenesin-Codeine] Nausea And Vomiting  . Verapamil     Current Outpatient Prescriptions on File Prior to Visit  Medication Sig Dispense Refill  . aspirin (ASPIRIN EC) 81 MG EC tablet Take 81 mg by mouth daily.      . digoxin (LANOXIN) 0.25 MG tablet Take 1 tablet (0.25 mg total) by mouth daily. 90 tablet 3  . glimepiride (AMARYL) 4 MG tablet TAKE 1 BY MOUTH DAILY BEFORE BREAKFAST 90 tablet 1  . hydrochlorothiazide (HYDRODIURIL) 25 MG tablet Take 1 tablet (25 mg total) by mouth daily. 90 tablet 3  . latanoprost (XALATAN) 0.005 % ophthalmic solution Place 1 drop into both eyes daily.     Marland Kitchen lisinopril (PRINIVIL,ZESTRIL) 40 MG tablet TAKE 1 BY MOUTH DAILY 90 tablet 0  . lovastatin (MEVACOR) 40 MG tablet TAKE 1 BY MOUTH AT BEDTIME  90 tablet 0  . metFORMIN (GLUCOPHAGE) 1000 MG tablet TAKE 1 BY MOUTH TWICE DAILY 180 tablet 0  . timolol (TIMOPTIC-XR) 0.5 % ophthalmic gel-forming Place 1 drop into both eyes daily.      No current facility-administered medications on file prior to visit.    BP 140/88 mmHg  Pulse 71  Temp(Src) 98 F (36.7 C) (Oral)  Resp 20  Ht 6' (1.829 m)  Wt 232 lb (105.235 kg)  BMI 31.46 kg/m2  SpO2 98%     Review of Systems  Constitutional: Negative for fever, chills, appetite change and fatigue.  HENT: Negative for congestion, dental problem, ear pain, hearing loss, sore throat, tinnitus, trouble swallowing and voice change.   Eyes: Negative for pain, discharge and visual disturbance.  Respiratory: Negative for cough, chest tightness, wheezing  and stridor.   Cardiovascular: Negative for chest pain, palpitations and leg swelling.  Gastrointestinal: Negative for nausea, vomiting, abdominal pain, diarrhea, constipation, blood in stool and abdominal distention.  Genitourinary: Negative for urgency, hematuria, flank pain, discharge, difficulty urinating and genital sores.  Musculoskeletal: Negative for myalgias, back pain, joint swelling, arthralgias, gait problem and neck stiffness.       Left shoulder and arm pain  Skin: Negative for rash.  Neurological: Negative for dizziness, syncope, speech difficulty, weakness, numbness and headaches.  Hematological: Negative for adenopathy. Does not bruise/bleed easily.  Psychiatric/Behavioral: Negative for behavioral problems and dysphoric mood. The patient is not nervous/anxious.        Objective:   Physical Exam  Constitutional: He is oriented to person, place, and time. He appears well-developed.  Repeat blood pressure as low as 120 over 76  HENT:  Head: Normocephalic.  Right Ear: External ear normal.  Left Ear: External ear normal.  Eyes: Conjunctivae and EOM are normal.  Neck: Normal range of motion.  Cardiovascular: Normal rate and normal heart sounds.   Pulmonary/Chest: Breath sounds normal.  Abdominal: Bowel sounds are normal.  Musculoskeletal: Normal range of motion. He exhibits no edema or tenderness.  Range of motion the left shoulder appears to be full without pain No local tenderness Range of motion of the head and neck appears to be normal without causing discomfort  Neurological: He is alert and oriented to person, place, and time.  Psychiatric: He has a normal mood and affect. His behavior is normal.          Assessment & Plan:   Diabetes mellitus.  Will check a hemoglobin A1c.  Patient encouraged to decrease Amaryl Paroxysmal atrial fibrillation.  Stable.  Recent cardiology evaluation.  Discontinue Lanoxin Left shoulder, upper arm pain.  Will treat with  Depo-Medrol and observe.  May need orthopedic referral if persists unclear whether this represents a primary shoulder issue or referred pain from possible cervical radiculopathy

## 2014-12-02 NOTE — Patient Instructions (Signed)
Limit your sodium (Salt) intake    It is important that you exercise regularly, at least 20 minutes 3 to 4 times per week.  If you develop chest pain or shortness of breath seek  medical attention.  You need to lose weight.  Consider a lower calorie diet and regular exercise.  Shoulder Exercises EXERCISES  RANGE OF MOTION (ROM) AND STRETCHING EXERCISES These exercises may help you when beginning to rehabilitate your injury. Your symptoms may resolve with or without further involvement from your physician, physical therapist or athletic trainer. While completing these exercises, remember:   Restoring tissue flexibility helps normal motion to return to the joints. This allows healthier, less painful movement and activity.  An effective stretch should be held for at least 30 seconds.  A stretch should never be painful. You should only feel a gentle lengthening or release in the stretched tissue. ROM - Pendulum  Bend at the waist so that your right / left arm falls away from your body. Support yourself with your opposite hand on a solid surface, such as a table or a countertop.  Your right / left arm should be perpendicular to the ground. If it is not perpendicular, you need to lean over farther. Relax the muscles in your right / left arm and shoulder as much as possible.  Gently sway your hips and trunk so they move your right / left arm without any use of your right / left shoulder muscles.  Progress your movements so that your right / left arm moves side to side, then forward and backward, and finally, both clockwise and counterclockwise.  Complete __________ repetitions in each direction. Many people use this exercise to relieve discomfort in their shoulder as well as to gain range of motion. Repeat __________ times. Complete this exercise __________ times per day. STRETCH - Flexion, Standing  Stand with good posture. With an underhand grip on your right / left hand and an overhand grip  on the opposite hand, grasp a broomstick or cane so that your hands are a little more than shoulder-width apart.  Keeping your right / left elbow straight and shoulder muscles relaxed, push the stick with your opposite hand to raise your right / left arm in front of your body and then overhead. Raise your arm until you feel a stretch in your right / left shoulder, but before you have increased shoulder pain.  Try to avoid shrugging your right / left shoulder as your arm rises by keeping your shoulder blade tucked down and toward your mid-back spine. Hold __________ seconds.  Slowly return to the starting position. Repeat __________ times. Complete this exercise __________ times per day. STRETCH - Internal Rotation  Place your right / left hand behind your back, palm-up.  Throw a towel or belt over your opposite shoulder. Grasp the towel/belt with your right / left hand.  While keeping an upright posture, gently pull up on the towel/belt until you feel a stretch in the front of your right / left shoulder.  Avoid shrugging your right / left shoulder as your arm rises by keeping your shoulder blade tucked down and toward your mid-back spine.  Hold __________. Release the stretch by lowering your opposite hand. Repeat __________ times. Complete this exercise __________ times per day. STRETCH - External Rotation and Abduction  Stagger your stance through a doorframe. It does not matter which foot is forward.  As instructed by your physician, physical therapist or athletic trainer, place your hands:  And  forearms above your head and on the door frame.  And forearms at head-height and on the door frame.  At elbow-height and on the door frame.  Keeping your head and chest upright and your stomach muscles tight to prevent over-extending your low-back, slowly shift your weight onto your front foot until you feel a stretch across your chest and/or in the front of your shoulders.  Hold  __________ seconds. Shift your weight to your back foot to release the stretch. Repeat __________ times. Complete this stretch __________ times per day.  STRENGTHENING EXERCISES  These exercises may help you when beginning to rehabilitate your injury. They may resolve your symptoms with or without further involvement from your physician, physical therapist or athletic trainer. While completing these exercises, remember:   Muscles can gain both the endurance and the strength needed for everyday activities through controlled exercises.  Complete these exercises as instructed by your physician, physical therapist or athletic trainer. Progress the resistance and repetitions only as guided.  You may experience muscle soreness or fatigue, but the pain or discomfort you are trying to eliminate should never worsen during these exercises. If this pain does worsen, stop and make certain you are following the directions exactly. If the pain is still present after adjustments, discontinue the exercise until you can discuss the trouble with your clinician.  If advised by your physician, during your recovery, avoid activity or exercises which involve actions that place your right / left hand or elbow above your head or behind your back or head. These positions stress the tissues which are trying to heal. STRENGTH - Scapular Depression and Adduction  With good posture, sit on a firm chair. Supported your arms in front of you with pillows, arm rests or a table top. Have your elbows in line with the sides of your body.  Gently draw your shoulder blades down and toward your mid-back spine. Gradually increase the tension without tensing the muscles along the top of your shoulders and the back of your neck.  Hold for __________ seconds. Slowly release the tension and relax your muscles completely before completing the next repetition.  After you have practiced this exercise, remove the arm support and complete it in  standing as well as sitting. Repeat __________ times. Complete this exercise __________ times per day.  STRENGTH - External Rotators  Secure a rubber exercise band/tubing to a fixed object so that it is at the same height as your right / left elbow when you are standing or sitting on a firm surface.  Stand or sit so that the secured exercise band/tubing is at your side that is not injured.  Bend your elbow 90 degrees. Place a folded towel or small pillow under your right / left arm so that your elbow is a few inches away from your side.  Keeping the tension on the exercise band/tubing, pull it away from your body, as if pivoting on your elbow. Be sure to keep your body steady so that the movement is only coming from your shoulder rotating.  Hold __________ seconds. Release the tension in a controlled manner as you return to the starting position. Repeat __________ times. Complete this exercise __________ times per day.  STRENGTH - Supraspinatus  Stand or sit with good posture. Grasp a __________ weight or an exercise band/tubing so that your hand is "thumbs-up," like when you shake hands.  Slowly lift your right / left hand from your thigh into the air, traveling about 30 degrees from  straight out at your side. Lift your hand to shoulder height or as far as you can without increasing any shoulder pain. Initially, many people do not lift their hands above shoulder height.  Avoid shrugging your right / left shoulder as your arm rises by keeping your shoulder blade tucked down and toward your mid-back spine.  Hold for __________ seconds. Control the descent of your hand as you slowly return to your starting position. Repeat __________ times. Complete this exercise __________ times per day.  STRENGTH - Shoulder Extensors  Secure a rubber exercise band/tubing so that it is at the height of your shoulders when you are either standing or sitting on a firm arm-less chair.  With a thumbs-up grip,  grasp an end of the band/tubing in each hand. Straighten your elbows and lift your hands straight in front of you at shoulder height. Step back away from the secured end of band/tubing until it becomes tense.  Squeezing your shoulder blades together, pull your hands down to the sides of your thighs. Do not allow your hands to go behind you.  Hold for __________ seconds. Slowly ease the tension on the band/tubing as you reverse the directions and return to the starting position. Repeat __________ times. Complete this exercise __________ times per day.  STRENGTH - Scapular Retractors  Secure a rubber exercise band/tubing so that it is at the height of your shoulders when you are either standing or sitting on a firm arm-less chair.  With a palm-down grip, grasp an end of the band/tubing in each hand. Straighten your elbows and lift your hands straight in front of you at shoulder height. Step back away from the secured end of band/tubing until it becomes tense.  Squeezing your shoulder blades together, draw your elbows back as you bend them. Keep your upper arm lifted away from your body throughout the exercise.  Hold __________ seconds. Slowly ease the tension on the band/tubing as you reverse the directions and return to the starting position. Repeat __________ times. Complete this exercise __________ times per day. STRENGTH - Scapular Depressors  Find a sturdy chair without wheels, such as a from a dining room table.  Keeping your feet on the floor, lift your bottom from the seat and lock your elbows.  Keeping your elbows straight, allow gravity to pull your body weight down. Your shoulders will rise toward your ears.  Raise your body against gravity by drawing your shoulder blades down your back, shortening the distance between your shoulders and ears. Although your feet should always maintain contact with the floor, your feet should progressively support less body weight as you get  stronger.  Hold __________ seconds. In a controlled and slow manner, lower your body weight to begin the next repetition. Repeat __________ times. Complete this exercise __________ times per day.  Document Released: 07/31/2005 Document Revised: 12/09/2011 Document Reviewed: 12/29/2008 [facility] Patient Information 2015 [place], [place]. This information is not intended to replace advice given to you by your health care provider. Make sure you discuss any questions you have with your health care provider.

## 2014-12-05 ENCOUNTER — Other Ambulatory Visit: Payer: Self-pay | Admitting: Internal Medicine

## 2014-12-05 DIAGNOSIS — E1140 Type 2 diabetes mellitus with diabetic neuropathy, unspecified: Secondary | ICD-10-CM

## 2014-12-09 ENCOUNTER — Encounter: Payer: Self-pay | Admitting: Endocrinology

## 2014-12-09 ENCOUNTER — Ambulatory Visit (INDEPENDENT_AMBULATORY_CARE_PROVIDER_SITE_OTHER): Payer: Medicare Other | Admitting: Endocrinology

## 2014-12-09 VITALS — BP 140/88 | HR 64 | Temp 98.9°F | Wt 229.6 lb

## 2014-12-09 DIAGNOSIS — E1140 Type 2 diabetes mellitus with diabetic neuropathy, unspecified: Secondary | ICD-10-CM

## 2014-12-09 NOTE — Progress Notes (Signed)
Subjective:    Patient ID: Jimmy Carr, male    DOB: Mar 16, 1941, 74 y.o.   MRN: [id number]  HPI pt states DM was dx'ed in 1985; he has severe neuropathy of the lower extremities; he has associated CAD; he has been on insulin since 2002; pt says his diet and exercise are not good; he has never had pancreatitis, severe hypoglycemia or DKA.  Pt checks cbg only fasting.  It varies from 100-150.  Past Medical History  Diagnosis Date  . Diabetes mellitus type II   . Hyperlipidemia   . Hypertension   . Peripheral neuropathy   . Cerebrovascular accident 2003    history of small vessal lacunar stroke 2003  . Glaucoma   . Other specified cardiac dysrhythmias(427.89)     PSVT  . AF (atrial fibrillation)     2D ECHO, 03/01/1987 - normal; NUCLEAR STRESS TEST, 04/10/2006 - EF 65%, no ischemia  . Bruit     CAROTID DOPPLER, 09/20/2008 - normal, no evidence of diameter reduction, significant tortuousity or any other vascular abnormality    Past Surgical History  Procedure Laterality Date  . Anal fissure repair    . Cardiac catheterization  02/02/2001    Normal LV systolic function, questionable mitral valve prolapse without mitral regurgitation, no evidence of CAD  . Cardiac catheterization  09/13/2008    Minimal CAD, normal LV systolic function, no evidence of renal artery stenosis, distal aortic disease, or iliac disease    History   Social History  . Marital Status: Married    Spouse Name: N/A  . Number of Children: N/A  . Years of Education: N/A   Occupational History  . Not on file.   Social History Main Topics  . Smoking status: Never Smoker   . Smokeless tobacco: Never Used  . Alcohol Use: No  . Drug Use: No  . Sexual Activity: Not on file   Other Topics Concern  . Not on file   Social History Narrative   Non smoker    On disability    Current Outpatient Prescriptions on File Prior to Visit  Medication Sig Dispense Refill  . aspirin (ASPIRIN EC) 81 MG EC tablet  Take 81 mg by mouth daily.      Marland Kitchen glucose blood (FREESTYLE TEST STRIPS) test strip 1 each by Other route 3 (three) times daily as needed for other. 100 each 12  . hydrochlorothiazide (HYDRODIURIL) 25 MG tablet Take 1 tablet (25 mg total) by mouth daily. 90 tablet 3  . Insulin Glargine (LANTUS SOLOSTAR) 100 UNIT/ML Solostar Pen Inject 50 Units into the skin daily at 10 pm. 20 pen 1  . insulin lispro (HUMALOG) 100 UNIT/ML KiwkPen Inject 0.5 mLs (50 Units total) into the skin 3 (three) times daily. INJECT 40 UNITS INTO THE SKIN 3 TIMES A DAY BEFORE MEALS*ADD ADDITIONAL 10 UNITS IF BS>180 BEFORE ME 50 pen 1  . Insulin Pen Needle 32G X 4 MM MISC 1 each by Does not apply route 4 (four) times daily -  before meals and at bedtime. 100 each 12  . latanoprost (XALATAN) 0.005 % ophthalmic solution Place 1 drop into both eyes daily.     Marland Kitchen lisinopril (PRINIVIL,ZESTRIL) 40 MG tablet TAKE 1 BY MOUTH DAILY 90 tablet 0  . lovastatin (MEVACOR) 40 MG tablet TAKE 1 BY MOUTH AT BEDTIME 90 tablet 0  . timolol (TIMOPTIC-XR) 0.5 % ophthalmic gel-forming Place 1 drop into both eyes daily.      No current facility-administered  medications on file prior to visit.    Allergies  Allergen Reactions  . Cheratussin Ac [Guaifenesin-Codeine] Nausea And Vomiting  . Verapamil     Family History  Problem Relation Age of Onset  . Diabetes Neg Hx     BP 140/88 mmHg  Pulse 64  Temp(Src) 98.9 F (37.2 C) (Oral)  Wt 229 lb 9.6 oz (104.146 kg)  Review of Systems denies weight loss, blurry vision, headache, chest pain, sob, n/v, urinary frequency, muscle cramps, excessive diaphoresis, depression, cold intolerance, rhinorrhea, hypoglycemia, and easy bruising.  He has chronic foot numbness and pain.       Objective:   Physical Exam VS: see vs page GEN: no distress Pulses: dorsalis pedis intact bilat.   MSK: no deformity of the feet CV: trace bilat leg edema Skin:  no ulcer on the feet, but the skin is dry.  normal color  and temp on the feet. Neuro: sensation is intact to touch on the feet, but severely decreased from normal    Lab Results  Component Value Date   HGBA1C 7.8* 12/02/2014      Assessment & Plan:  DM: new to me.  I need more cbg info in order to safely adjust insulin.  Patient is advised the following: Patient Instructions  good diet and exercise habits significanly improve the control of your diabetes.  please let me know if you wish to be referred to a dietician.  high blood sugar is very risky to your health.  you should see an eye doctor and dentist every year.  It is very important to get all recommended vaccinations.  controlling your blood pressure and cholesterol drastically reduces the damage diabetes does to your body.  Those who smoke should quit.  please discuss these with your doctor.  check your blood sugar twice a day.  vary the time of day when you check, between before the 3 meals, and at bedtime.  also check if you have symptoms of your blood sugar being too high or too low.  please keep a record of the readings and bring it to your next appointment here.  You can write it on any piece of paper.  please call us sooner if your blood sugar goes below 70, or if you have a lot of readings over 200. Please continue the same insulins for now. You can stop taking the metfomin and glimepiride.  Please come back for a follow-up appointment in 1 month.

## 2014-12-09 NOTE — Progress Notes (Signed)
Pre visit review using our clinic review tool, if applicable. No additional management support is needed unless otherwise documented below in the visit note. 

## 2014-12-09 NOTE — Patient Instructions (Addendum)
good diet and exercise habits significanly improve the control of your diabetes.  please let me know if you wish to be referred to a dietician.  high blood sugar is very risky to your health.  you should see an eye doctor and dentist every year.  It is very important to get all recommended vaccinations.  controlling your blood pressure and cholesterol drastically reduces the damage diabetes does to your body.  Those who smoke should quit.  please discuss these with your doctor.  check your blood sugar twice a day.  vary the time of day when you check, between before the 3 meals, and at bedtime.  also check if you have symptoms of your blood sugar being too high or too low.  please keep a record of the readings and bring it to your next appointment here.  You can write it on any piece of paper.  please call us sooner if your blood sugar goes below 70, or if you have a lot of readings over 200. Please continue the same insulins for now. You can stop taking the metfomin and glimepiride.  Please come back for a follow-up appointment in 1 month.

## 2015-01-09 ENCOUNTER — Ambulatory Visit (INDEPENDENT_AMBULATORY_CARE_PROVIDER_SITE_OTHER): Payer: Medicare Other | Admitting: Endocrinology

## 2015-01-09 ENCOUNTER — Encounter: Payer: Self-pay | Admitting: Endocrinology

## 2015-01-09 VITALS — BP 126/86 | HR 71 | Temp 97.7°F | Ht 72.0 in | Wt 233.0 lb

## 2015-01-09 DIAGNOSIS — E1140 Type 2 diabetes mellitus with diabetic neuropathy, unspecified: Secondary | ICD-10-CM | POA: Diagnosis not present

## 2015-01-09 NOTE — Progress Notes (Signed)
Subjective:    Patient ID: Jimmy Carr, male    DOB: Oct 31, 1940, 74 y.o.   MRN: [id number]  HPI  Pt returns for f/u of diabetes mellitus: DM type: Insulin-requiring type 2 Dx'ed: 7829 Complications: poly neuropathy and CAD Therapy: insulin since 2002 DKA: never Severe hypoglycemia: never Pancreatitis: never Other: he takes multiple daily injections Interval history: he brings a record of his cbg's which i have reviewed today.  It varies from 67-200's, but most are in the low-100's.  He takes a varying dosage of lantus.  He does not eat breakfast, so he does not take humalog then.   Past Medical History  Diagnosis Date  . Diabetes mellitus type II   . Hyperlipidemia   . Hypertension   . Peripheral neuropathy   . Cerebrovascular accident 2003    history of small vessal lacunar stroke 2003  . Glaucoma   . Other specified cardiac dysrhythmias(427.89)     PSVT  . AF (atrial fibrillation)     2D ECHO, 03/01/1987 - normal; NUCLEAR STRESS TEST, 04/10/2006 - EF 65%, no ischemia  . Bruit     CAROTID DOPPLER, 09/20/2008 - normal, no evidence of diameter reduction, significant tortuousity or any other vascular abnormality    Past Surgical History  Procedure Laterality Date  . Anal fissure repair    . Cardiac catheterization  02/02/2001    Normal LV systolic function, questionable mitral valve prolapse without mitral regurgitation, no evidence of CAD  . Cardiac catheterization  09/13/2008    Minimal CAD, normal LV systolic function, no evidence of renal artery stenosis, distal aortic disease, or iliac disease    History   Social History  . Marital Status: Married    Spouse Name: N/A  . Number of Children: N/A  . Years of Education: N/A   Occupational History  . Not on file.   Social History Main Topics  . Smoking status: Never Smoker   . Smokeless tobacco: Never Used  . Alcohol Use: No  . Drug Use: No  . Sexual Activity: Not on file   Other Topics Concern  . Not on  file   Social History Narrative   Non smoker    On disability    Current Outpatient Prescriptions on File Prior to Visit  Medication Sig Dispense Refill  . aspirin (ASPIRIN EC) 81 MG EC tablet Take 81 mg by mouth daily.      Marland Kitchen glucose blood (FREESTYLE TEST STRIPS) test strip 1 each by Other route 3 (three) times daily as needed for other. 100 each 12  . hydrochlorothiazide (HYDRODIURIL) 25 MG tablet Take 1 tablet (25 mg total) by mouth daily. 90 tablet 3  . Insulin Glargine (LANTUS SOLOSTAR) 100 UNIT/ML Solostar Pen Inject 50 Units into the skin daily at 10 pm. 20 pen 1  . Insulin Pen Needle 32G X 4 MM MISC 1 each by Does not apply route 4 (four) times daily -  before meals and at bedtime. 100 each 12  . latanoprost (XALATAN) 0.005 % ophthalmic solution Place 1 drop into both eyes daily.     Marland Kitchen lisinopril (PRINIVIL,ZESTRIL) 40 MG tablet TAKE 1 BY MOUTH DAILY 90 tablet 0  . lovastatin (MEVACOR) 40 MG tablet TAKE 1 BY MOUTH AT BEDTIME 90 tablet 0  . timolol (TIMOPTIC-XR) 0.5 % ophthalmic gel-forming Place 1 drop into both eyes daily.      No current facility-administered medications on file prior to visit.    Allergies  Allergen Reactions  .  Cheratussin Ac [Guaifenesin-Codeine] Nausea And Vomiting  . Verapamil     Family History  Problem Relation Age of Onset  . Diabetes Neg Hx     BP 126/86 mmHg  Pulse 71  Temp(Src) 97.7 F (36.5 C) (Oral)  Ht 6' (1.829 m)  Wt 233 lb (105.688 kg)  BMI 31.59 kg/m2  SpO2 97%  Review of Systems Denies LOC and weight change.     Objective:   Physical Exam VITAL SIGNS:  See vs page GENERAL: no distress Pulses: dorsalis pedis intact bilat.   MSK: no deformity of the feet  CV: trace bilat leg edema  Skin: no ulcer on the feet, but the skin is dry. normal color and temp on the feet.  Neuro: sensation is intact to touch on the feet, but severely decreased from normal Ext: There is bilateral onychomycosis of the toenails.        Assessment & Plan:  DM: control is much better  Patient is advised the following: Patient Instructions  check your blood sugar twice a day.  vary the time of day when you check, between before the 3 meals, and at bedtime.  also check if you have symptoms of your blood sugar being too high or too low.  please keep a record of the readings and bring it to your next appointment here.  You can write it on any piece of paper.  please call us sooner if your blood sugar goes below 70, or if you have a lot of readings over 200. Please continue the same insulins for now.  It is important to take just these amounts.  Please come back for a follow-up appointment in 6 weeks.

## 2015-01-09 NOTE — Patient Instructions (Addendum)
check your blood sugar twice a day.  vary the time of day when you check, between before the 3 meals, and at bedtime.  also check if you have symptoms of your blood sugar being too high or too low.  please keep a record of the readings and bring it to your next appointment here.  You can write it on any piece of paper.  please call us sooner if your blood sugar goes below 70, or if you have a lot of readings over 200. Please continue the same insulins for now.  It is important to take just these amounts.  Please come back for a follow-up appointment in 6 weeks.

## 2015-01-16 ENCOUNTER — Telehealth: Payer: Self-pay | Admitting: Internal Medicine

## 2015-01-16 NOTE — Telephone Encounter (Signed)
Pharm needs new rx for humalog quikpen and directions. Please call [phone]. Pt would like 4 boxes

## 2015-01-17 MED ORDER — INSULIN LISPRO 100 UNIT/ML (KWIKPEN): 50.0000 [IU] | PEN_INJECTOR | Freq: Three times a day (TID) | SUBCUTANEOUS | Status: DC

## 2015-01-17 NOTE — Telephone Encounter (Signed)
Spoke to pt, clarified dosage for Humalog Quickpens pt is using 50 units three times a day with meals? Pt said yes. Told him okay will send Rx to pharmacy for him. Pt verbalized understanding.

## 2015-01-26 ENCOUNTER — Other Ambulatory Visit: Payer: Self-pay | Admitting: *Deleted

## 2015-01-26 MED ORDER — LOVASTATIN 40 MG PO TABS: ORAL_TABLET | ORAL | Status: DC

## 2015-01-26 MED ORDER — LISINOPRIL 40 MG PO TABS: ORAL_TABLET | ORAL | Status: DC

## 2015-02-01 ENCOUNTER — Other Ambulatory Visit: Payer: Self-pay

## 2015-02-01 ENCOUNTER — Telehealth: Payer: Self-pay | Admitting: Endocrinology

## 2015-02-01 ENCOUNTER — Telehealth: Payer: Self-pay | Admitting: Internal Medicine

## 2015-02-01 NOTE — Telephone Encounter (Signed)
Pt needs refill on metformin 1000 mg twice a day #180 with refills send to [facility]

## 2015-02-01 NOTE — Telephone Encounter (Signed)
For now, please d/c glimepiride and increase lantus to 60 units qhs i'll see you soon.

## 2015-02-01 NOTE — Telephone Encounter (Signed)
Spoke to pt, told him according to Dr. Cordelia Pen notes from 12/09/2014 you were suppose to stop taking Metformin and Glimepiride. Told pt need to contact Dr. Cordelia Pen office to discuss with him being as you see him for your Diabetes. Pt verbalized understanding.

## 2015-02-01 NOTE — Telephone Encounter (Signed)
See note below. I contacted the patient, I advised him on 12/09/2014 he was to stop taking the metformin and glimepiride. Pt stated he did not remember be advised of that note. Patient stated the glimepiride has been help keeping his morning blood sugar readings down. Patient is coming in for a appointment on 02/20/2015. Do you want to wait and discuss medication at the office visit or, should adjustments be made now? Thanks!

## 2015-02-01 NOTE — Telephone Encounter (Signed)
Left voicemail advising of note below. Requested call back if the patient would like to discuss. 

## 2015-02-01 NOTE — Telephone Encounter (Signed)
Patient need refill of metformin, patient has questions about medication, please advise

## 2015-02-10 ENCOUNTER — Telehealth: Payer: Self-pay | Admitting: Endocrinology

## 2015-02-10 MED ORDER — INSULIN GLARGINE 100 UNIT/ML SOLOSTAR PEN: 50.0000 [IU] | PEN_INJECTOR | Freq: Every day | SUBCUTANEOUS | Status: DC

## 2015-02-10 NOTE — Telephone Encounter (Signed)
Rx sent per patients request.  

## 2015-02-10 NOTE — Telephone Encounter (Signed)
Patient need a refill Lantus Solostar, [facility] [place], please advise

## 2015-02-20 ENCOUNTER — Ambulatory Visit (INDEPENDENT_AMBULATORY_CARE_PROVIDER_SITE_OTHER): Payer: Medicare Other | Admitting: Endocrinology

## 2015-02-20 ENCOUNTER — Encounter: Payer: Self-pay | Admitting: Endocrinology

## 2015-02-20 VITALS — BP 132/80 | HR 81 | Temp 98.3°F | Ht 72.0 in | Wt 237.0 lb

## 2015-02-20 DIAGNOSIS — E1140 Type 2 diabetes mellitus with diabetic neuropathy, unspecified: Secondary | ICD-10-CM

## 2015-02-20 LAB — HEMOGLOBIN A1C: HEMOGLOBIN A1C: 7.1 % — AB (ref 4.6–6.5)

## 2015-02-20 MED ORDER — INSULIN GLARGINE 100 UNIT/ML SOLOSTAR PEN: 50.0000 [IU] | PEN_INJECTOR | Freq: Every day | SUBCUTANEOUS | Status: DC

## 2015-02-20 MED ORDER — INSULIN LISPRO 100 UNIT/ML (KWIKPEN): PEN_INJECTOR | SUBCUTANEOUS | Status: DC

## 2015-02-20 NOTE — Progress Notes (Signed)
Subjective:    Patient ID: Jimmy Carr, male    DOB: 02/20/1941, 74 y.o.   MRN: [id number]  HPI Pt returns for f/u of diabetes mellitus: DM type: Insulin-requiring type 2 Dx'ed: 9528 Complications: polyneuropathy and CAD Therapy: insulin since 2002 DKA: never Severe hypoglycemia: never Pancreatitis: never Other: he takes multiple daily injections. Interval history: pt brings his meter, which is downloaded and scanned into the record.  It varies from 80-250, but most are in the low-100's.  It is lowest in the afternoon.  Hx is from pt and wife.  pt states he feels well in general. Past Medical History  Diagnosis Date  . Diabetes mellitus type II   . Hyperlipidemia   . Hypertension   . Peripheral neuropathy   . Cerebrovascular accident 2003    history of small vessal lacunar stroke 2003  . Glaucoma   . Other specified cardiac dysrhythmias(427.89)     PSVT  . AF (atrial fibrillation)     2D ECHO, 03/01/1987 - normal; NUCLEAR STRESS TEST, 04/10/2006 - EF 65%, no ischemia  . Bruit     CAROTID DOPPLER, 09/20/2008 - normal, no evidence of diameter reduction, significant tortuousity or any other vascular abnormality    Past Surgical History  Procedure Laterality Date  . Anal fissure repair    . Cardiac catheterization  02/02/2001    Normal LV systolic function, questionable mitral valve prolapse without mitral regurgitation, no evidence of CAD  . Cardiac catheterization  09/13/2008    Minimal CAD, normal LV systolic function, no evidence of renal artery stenosis, distal aortic disease, or iliac disease    History   Social History  . Marital Status: Married    Spouse Name: N/A  . Number of Children: N/A  . Years of Education: N/A   Occupational History  . Not on file.   Social History Main Topics  . Smoking status: Never Smoker   . Smokeless tobacco: Never Used  . Alcohol Use: No  . Drug Use: No  . Sexual Activity: Not on file   Other Topics Concern  . Not on file    Social History Narrative   Non smoker    On disability    Current Outpatient Prescriptions on File Prior to Visit  Medication Sig Dispense Refill  . aspirin (ASPIRIN EC) 81 MG EC tablet Take 81 mg by mouth daily.      Marland Kitchen glucose blood (FREESTYLE TEST STRIPS) test strip 1 each by Other route 3 (three) times daily as needed for other. 100 each 12  . hydrochlorothiazide (HYDRODIURIL) 25 MG tablet Take 1 tablet (25 mg total) by mouth daily. 90 tablet 3  . Insulin Pen Needle 32G X 4 MM MISC 1 each by Does not apply route 4 (four) times daily -  before meals and at bedtime. 100 each 12  . latanoprost (XALATAN) 0.005 % ophthalmic solution Place 1 drop into both eyes daily.     Marland Kitchen lisinopril (PRINIVIL,ZESTRIL) 40 MG tablet TAKE 1 BY MOUTH DAILY 90 tablet 1  . lovastatin (MEVACOR) 40 MG tablet TAKE 1 BY MOUTH AT BEDTIME 90 tablet 1  . timolol (TIMOPTIC-XR) 0.5 % ophthalmic gel-forming Place 1 drop into both eyes daily.      No current facility-administered medications on file prior to visit.    Allergies  Allergen Reactions  . Cheratussin Ac [Guaifenesin-Codeine] Nausea And Vomiting  . Verapamil     Family History  Problem Relation Age of Onset  . Diabetes Neg  Hx     BP 132/80 mmHg  Pulse 81  Temp(Src) 98.3 F (36.8 C) (Oral)  Ht 6' (1.829 m)  Wt 237 lb (107.502 kg)  BMI 32.14 kg/m2  SpO2 95%    Review of Systems He denies hypoglycemia.  No weight change    Objective:   Physical Exam VITAL SIGNS:  See vs page GENERAL: no distress Pulses: dorsalis pedis intact bilat.   MSK: no deformity of the feet CV: trace bilat leg edema Skin:  no ulcer on the feet.  normal color and temp on the feet, but the skin is dry Neuro: sensation is intact to touch on the feet, but decreased from normal.     Lab Results  Component Value Date   HGBA1C 7.1* 02/20/2015       Assessment & Plan:  DM: glycemic control is improved.  We'll be able to get a1c better, but first, we need to  reduce hypoglycemia.  Patient is advised the following: Patient Instructions  check your blood sugar twice a day.  vary the time of day when you check, between before the 3 meals, and at bedtime.  also check if you have symptoms of your blood sugar being too high or too low.  please keep a record of the readings and bring it to your next appointment here.  You can write it on any piece of paper.  please call us sooner if your blood sugar goes below 70, or if you have a lot of readings over 200. blood tests are requested for you today.  We'll let you know about the results. Please come back for a follow-up appointment in 3 months.     addendum: reduce lunch humalog to 40 units.

## 2015-02-20 NOTE — Patient Instructions (Addendum)
check your blood sugar twice a day.  vary the time of day when you check, between before the 3 meals, and at bedtime.  also check if you have symptoms of your blood sugar being too high or too low.  please keep a record of the readings and bring it to your next appointment here.  You can write it on any piece of paper.  please call us sooner if your blood sugar goes below 70, or if you have a lot of readings over 200. blood tests are requested for you today.  We'll let you know about the results. Please come back for a follow-up appointment in 3 months.

## 2015-03-06 ENCOUNTER — Telehealth: Payer: Self-pay | Admitting: Internal Medicine

## 2015-03-06 NOTE — Telephone Encounter (Signed)
Pt called dr Loanne Drilling office and they no longer provide samples. Pt needs samples of  humalog ? 200 unit

## 2015-03-06 NOTE — Telephone Encounter (Signed)
Spoke to pt, told him I do have a sample of Humalog Kwikpen 200 units that you can come by and pickup. Pt verbalized understanding.

## 2015-05-03 DIAGNOSIS — H34211 Partial retinal artery occlusion, right eye: Secondary | ICD-10-CM | POA: Diagnosis not present

## 2015-05-03 DIAGNOSIS — H4011X3 Primary open-angle glaucoma, severe stage: Secondary | ICD-10-CM | POA: Diagnosis not present

## 2015-05-03 DIAGNOSIS — E11329 Type 2 diabetes mellitus with mild nonproliferative diabetic retinopathy without macular edema: Secondary | ICD-10-CM | POA: Diagnosis not present

## 2015-05-03 LAB — HM DIABETES EYE EXAM

## 2015-05-11 DIAGNOSIS — E113299 Type 2 diabetes mellitus with mild nonproliferative diabetic retinopathy without macular edema, unspecified eye: Secondary | ICD-10-CM | POA: Insufficient documentation

## 2015-05-11 DIAGNOSIS — H34211 Partial retinal artery occlusion, right eye: Secondary | ICD-10-CM | POA: Insufficient documentation

## 2015-05-15 ENCOUNTER — Encounter: Payer: Self-pay | Admitting: Internal Medicine

## 2015-05-16 ENCOUNTER — Ambulatory Visit (INDEPENDENT_AMBULATORY_CARE_PROVIDER_SITE_OTHER): Payer: Medicare Other | Admitting: Internal Medicine

## 2015-05-16 ENCOUNTER — Encounter: Payer: Self-pay | Admitting: Internal Medicine

## 2015-05-16 VITALS — BP 130/80 | HR 86 | Temp 98.5°F | Resp 20 | Ht 72.0 in | Wt 241.0 lb

## 2015-05-16 DIAGNOSIS — E785 Hyperlipidemia, unspecified: Secondary | ICD-10-CM | POA: Diagnosis not present

## 2015-05-16 DIAGNOSIS — I10 Essential (primary) hypertension: Secondary | ICD-10-CM

## 2015-05-16 DIAGNOSIS — I779 Disorder of arteries and arterioles, unspecified: Secondary | ICD-10-CM

## 2015-05-16 DIAGNOSIS — M791 Myalgia: Secondary | ICD-10-CM | POA: Diagnosis not present

## 2015-05-16 DIAGNOSIS — E1140 Type 2 diabetes mellitus with diabetic neuropathy, unspecified: Secondary | ICD-10-CM

## 2015-05-16 DIAGNOSIS — M609 Myositis, unspecified: Secondary | ICD-10-CM | POA: Diagnosis not present

## 2015-05-16 DIAGNOSIS — I739 Peripheral vascular disease, unspecified: Secondary | ICD-10-CM

## 2015-05-16 DIAGNOSIS — Z8679 Personal history of other diseases of the circulatory system: Secondary | ICD-10-CM

## 2015-05-16 DIAGNOSIS — IMO0001 Reserved for inherently not codable concepts without codable children: Secondary | ICD-10-CM

## 2015-05-16 LAB — CBC WITH DIFFERENTIAL/PLATELET
BASOS PCT: 0.3 % (ref 0.0–3.0)
Basophils Absolute: 0 10*3/uL (ref 0.0–0.1)
EOS PCT: 5.5 % — AB (ref 0.0–5.0)
Eosinophils Absolute: 0.5 10*3/uL (ref 0.0–0.7)
HCT: 47.8 % (ref 39.0–52.0)
Hemoglobin: 16.5 g/dL (ref 13.0–17.0)
LYMPHS ABS: 2.4 10*3/uL (ref 0.7–4.0)
Lymphocytes Relative: 26.7 % (ref 12.0–46.0)
MCHC: 34.4 g/dL (ref 30.0–36.0)
MCV: 80.9 fl (ref 78.0–100.0)
MONO ABS: 0.7 10*3/uL (ref 0.1–1.0)
MONOS PCT: 8.3 % (ref 3.0–12.0)
NEUTROS ABS: 5.3 10*3/uL (ref 1.4–7.7)
NEUTROS PCT: 59.2 % (ref 43.0–77.0)
PLATELETS: 153 10*3/uL (ref 150.0–400.0)
RBC: 5.91 Mil/uL — ABNORMAL HIGH (ref 4.22–5.81)
RDW: 14.7 % (ref 11.5–15.5)
WBC: 8.9 10*3/uL (ref 4.0–10.5)

## 2015-05-16 LAB — LIPID PANEL
CHOL/HDL RATIO: 5
Cholesterol: 139 mg/dL (ref 0–200)
HDL: 30.4 mg/dL — AB (ref >39.00)
NONHDL: 108.41
TRIGLYCERIDES: 273 mg/dL — AB (ref 0.0–149.0)
VLDL: 54.6 mg/dL — ABNORMAL HIGH (ref 0.0–40.0)

## 2015-05-16 LAB — COMPREHENSIVE METABOLIC PANEL
ALT: 24 U/L (ref 0–53)
AST: 19 U/L (ref 0–37)
Albumin: 4.1 g/dL (ref 3.5–5.2)
Alkaline Phosphatase: 68 U/L (ref 39–117)
BUN: 30 mg/dL — AB (ref 6–23)
CO2: 26 meq/L (ref 19–32)
Calcium: 9.4 mg/dL (ref 8.4–10.5)
Chloride: 102 mEq/L (ref 96–112)
Creatinine, Ser: 1.57 mg/dL — ABNORMAL HIGH (ref 0.40–1.50)
GFR: 46.37 mL/min — AB (ref >60.00)
GLUCOSE: 310 mg/dL — AB (ref 70–99)
POTASSIUM: 4.2 meq/L (ref 3.5–5.1)
SODIUM: 137 meq/L (ref 135–145)
Total Bilirubin: 0.8 mg/dL (ref 0.2–1.2)
Total Protein: 7.1 g/dL (ref 6.0–8.3)

## 2015-05-16 LAB — MICROALBUMIN / CREATININE URINE RATIO
CREATININE, U: 174.1 mg/dL
MICROALB/CREAT RATIO: 1.1 mg/g (ref 0.0–30.0)
Microalb, Ur: 1.9 mg/dL (ref 0.0–1.9)

## 2015-05-16 LAB — CK: Total CK: 132 U/L (ref 7–232)

## 2015-05-16 LAB — TSH: TSH: 1.09 u[IU]/mL (ref 0.35–4.50)

## 2015-05-16 LAB — LDL CHOLESTEROL, DIRECT: Direct LDL: 67 mg/dL

## 2015-05-16 NOTE — Progress Notes (Signed)
Pre visit review using our clinic review tool, if applicable. No additional management support is needed unless otherwise documented below in the visit note. 

## 2015-05-16 NOTE — Progress Notes (Signed)
Subjective:    Patient ID: Jimmy Carr, male    DOB: 09/05/1941, 74 y.o.   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  He is followed by endocrine and is on basal bolus insulin.  Metformin therapy and cannot provide have been discontinued.  However, he still takes both medications sporadically.  If blood sugar is elevated History of complaint today is a one-month history of generalized aching and fatigue.  He has been on lovastatin for some time. 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  He does have a history of cervical vascular disease. Last carotid artery Doppler study 2009  Past Medical History  Diagnosis Date  . Diabetes mellitus type II   . Hyperlipidemia   . Hypertension   . Peripheral neuropathy   . Cerebrovascular accident 2003    history of small vessal lacunar stroke 2003  . Glaucoma   . Other specified cardiac dysrhythmias(427.89)     PSVT  . AF (atrial fibrillation)     2D ECHO, 03/01/1987 - normal; NUCLEAR STRESS TEST, 04/10/2006 - EF 65%, no ischemia  . Bruit     CAROTID DOPPLER, 09/20/2008 - normal, no evidence of diameter reduction, significant tortuousity or any other vascular abnormality    Social History   Social History  . Marital Status: Married    Spouse Name: N/A  . Number of Children: N/A  . Years of Education: N/A   Occupational History  . Not on file.   Social History Main Topics  . Smoking status: Never Smoker   . Smokeless tobacco: Never Used  . Alcohol Use: No  . Drug Use: No  . Sexual Activity: Not on file   Other Topics Concern  . Not on file   Social History Narrative   Non smoker    On disability    Past Surgical History  Procedure Laterality Date  . Anal fissure repair    . Cardiac catheterization  02/02/2001    Normal LV  systolic function, questionable mitral valve prolapse without mitral regurgitation, no evidence of CAD  . Cardiac catheterization  09/13/2008    Minimal CAD, normal LV systolic function, no evidence of renal artery stenosis, distal aortic disease, or iliac disease    Family History  Problem Relation Age of Onset  . Diabetes Neg Hx     Allergies  Allergen Reactions  . Cheratussin Ac [Guaifenesin-Codeine] Nausea And Vomiting  . Verapamil     Current Outpatient Prescriptions on File Prior to Visit  Medication Sig Dispense Refill  . aspirin (ASPIRIN EC) 81 MG EC tablet Take 81 mg by mouth daily.      Marland Kitchen glucose blood (FREESTYLE TEST STRIPS) test strip 1 each by Other route 3 (three) times daily as needed for other. 100 each 12  . hydrochlorothiazide (HYDRODIURIL) 25 MG tablet Take 1 tablet (25 mg total) by mouth daily. 90 tablet 3  . Insulin Glargine (LANTUS SOLOSTAR) 100 UNIT/ML Solostar Pen Inject 50 Units into the skin daily at 10 pm. 20 pen 1  . insulin lispro (HUMALOG KWIKPEN) 100 UNIT/ML KiwkPen 3 times a day (just before each meal) 50-40-50 units 45 mL 11  . Insulin Pen Needle 32G X 4 MM MISC 1 each by Does not apply route 4 (four) times daily -  before meals and at bedtime. 100 each 12  . latanoprost (XALATAN) 0.005 % ophthalmic solution Place 1 drop into both eyes daily.     Marland Kitchen lisinopril (PRINIVIL,ZESTRIL) 40 MG tablet TAKE 1 BY MOUTH DAILY 90 tablet 1  . lovastatin (MEVACOR) 40 MG tablet TAKE 1 BY MOUTH AT BEDTIME 90 tablet 1  . timolol (TIMOPTIC-XR) 0.5 % ophthalmic gel-forming Place 1 drop into both eyes daily.      No current facility-administered medications on file prior to visit.    BP 130/80 mmHg  Pulse 86  Temp(Src) 98.5 F (36.9 C) (Oral)  Resp 20  Ht 6' (1.829 m)  Wt 241 lb (109.317 kg)  BMI 32.68 kg/m2  SpO2 97%      Review of Systems  Constitutional: Positive for fatigue. Negative for fever, chills and appetite change.  HENT: Negative for congestion,  dental problem, ear pain, hearing loss, sore throat, tinnitus, trouble swallowing and voice change.   Eyes: Negative for pain, discharge and visual disturbance.  Respiratory: Negative for cough, chest tightness, wheezing and stridor.   Cardiovascular: Negative for chest pain, palpitations and leg swelling.  Gastrointestinal: Negative for nausea, vomiting, abdominal pain, diarrhea, constipation, blood in stool and abdominal distention.  Genitourinary: Negative for urgency, hematuria, flank pain, discharge, difficulty urinating and genital sores.  Musculoskeletal: Positive for myalgias. Negative for back pain, joint swelling, arthralgias, gait problem and neck stiffness.  Skin: Negative for rash.  Neurological: Negative for dizziness, syncope, speech difficulty, weakness, numbness and headaches.  Hematological: Negative for adenopathy. Does not bruise/bleed easily.  Psychiatric/Behavioral: Negative for behavioral problems and dysphoric mood. The patient is not nervous/anxious.        Objective:   Physical Exam  Constitutional: He is oriented to person, place, and time. He appears well-developed.  Weight 241 Blood pressure 110/65  HENT:  Head: Normocephalic.  Right Ear: External ear normal.  Left Ear: External ear normal.  Eyes: Conjunctivae and EOM are normal.  Neck: Normal range of motion.  Carotid upstrokes brisk without bruit  Cardiovascular: Normal rate, normal heart sounds and intact distal pulses.   Pulmonary/Chest: Breath sounds normal.  Abdominal: Bowel sounds are normal.  Musculoskeletal: Normal range of motion. He exhibits no edema or tenderness.  Neurological: He is alert and oriented to person, place, and time.  Psychiatric: He has a normal mood and affect. His behavior is normal.          Assessment & Plan:   History of small cholesterol emboli right eye.  Will check follow-up carotid artery Doppler study Diabetes mellitus.  The patient was told to discontinue  sporadic metformin and completely discontinue glimepiride.  He will discuss with endocrinology, the possibility of resuming metformin therapy Hypertension, well-controlled Myalgias.  We'll check screening lab including CPK  Follow-up cardiology and endocrine Return here in 6 months or as needed

## 2015-05-16 NOTE — Patient Instructions (Addendum)
Endocrine follow-up as scheduled  Carotid artery Doppler study as scheduled  Return in 6 months for follow-up  Limit your sodium (Salt) intake   Please check your hemoglobin A1c every 3 months    It is important that you exercise regularly, at least 20 minutes 3 to 4 times per week.  If you develop chest pain or shortness of breath seek  medical attention.  Discontinue glimepiride

## 2015-05-22 ENCOUNTER — Other Ambulatory Visit: Payer: Self-pay | Admitting: Internal Medicine

## 2015-05-22 ENCOUNTER — Telehealth: Payer: Self-pay | Admitting: Internal Medicine

## 2015-05-22 DIAGNOSIS — I779 Disorder of arteries and arterioles, unspecified: Secondary | ICD-10-CM

## 2015-05-22 DIAGNOSIS — H539 Unspecified visual disturbance: Secondary | ICD-10-CM

## 2015-05-22 DIAGNOSIS — I739 Peripheral vascular disease, unspecified: Principal | ICD-10-CM

## 2015-05-22 NOTE — Telephone Encounter (Signed)
Pt request call back about lab results he had last week

## 2015-05-23 ENCOUNTER — Encounter: Payer: Self-pay | Admitting: Endocrinology

## 2015-05-23 ENCOUNTER — Telehealth: Payer: Self-pay | Admitting: Internal Medicine

## 2015-05-23 ENCOUNTER — Ambulatory Visit (INDEPENDENT_AMBULATORY_CARE_PROVIDER_SITE_OTHER): Payer: Medicare Other | Admitting: Endocrinology

## 2015-05-23 VITALS — BP 132/80 | HR 82 | Temp 98.2°F | Ht 72.0 in | Wt 241.0 lb

## 2015-05-23 DIAGNOSIS — E1140 Type 2 diabetes mellitus with diabetic neuropathy, unspecified: Secondary | ICD-10-CM

## 2015-05-23 LAB — POCT GLYCOSYLATED HEMOGLOBIN (HGB A1C): HEMOGLOBIN A1C: 7.8

## 2015-05-23 MED ORDER — INSULIN LISPRO 100 UNIT/ML (KWIKPEN): PEN_INJECTOR | SUBCUTANEOUS | Status: DC

## 2015-05-23 MED ORDER — INSULIN GLARGINE 100 UNIT/ML SOLOSTAR PEN: 60.0000 [IU] | PEN_INJECTOR | Freq: Every day | SUBCUTANEOUS | Status: DC

## 2015-05-23 NOTE — Telephone Encounter (Signed)
Pt called back told him that labs were normal except blood sugar was elevated and kidney studies showed slight worsening. Will need to repeat in 3 months at the time of his next hemoglobin A1c. Pt verbalized understanding.

## 2015-05-23 NOTE — Patient Instructions (Signed)
Please decrease the humalog to 3 times a day (just before each meal) 50-35-50 units, and: Increase the lantus to 60 units at bedtime.   check your blood sugar twice a day.  vary the time of day when you check, between before the 3 meals, and at bedtime.  also check if you have symptoms of your blood sugar being too high or too low.  please keep a record of the readings and bring it to your next appointment here.  You can write it on any piece of paper.  please call us sooner if your blood sugar goes below 70, or if you have a lot of readings over 200. Please come back for a follow-up appointment in 3 months.

## 2015-05-23 NOTE — Progress Notes (Signed)
Subjective:    Patient ID: Jimmy Carr, male    DOB: 16-Dec-1940, 74 y.o.   MRN: [id number]  HPI Pt returns for f/u of diabetes mellitus: DM type: Insulin-requiring type 2 Dx'ed: 5427 Complications: polyneuropathy and CAD Therapy: insulin since 2002 DKA: never Severe hypoglycemia: never Pancreatitis: never.  Other: he takes multiple daily injections.   Interval history: pt brings his meter, which is downloaded and scanned into the record.  cbg's vary from 80-250, but most are in the low-100's.  It is highest in am, and lowest in the afternoon.  pt states he feels well in general.   Past Medical History  Diagnosis Date  . Diabetes mellitus type II   . Hyperlipidemia   . Hypertension   . Peripheral neuropathy   . Cerebrovascular accident 2003    history of small vessal lacunar stroke 2003  . Glaucoma   . Other specified cardiac dysrhythmias(427.89)     PSVT  . AF (atrial fibrillation)     2D ECHO, 03/01/1987 - normal; NUCLEAR STRESS TEST, 04/10/2006 - EF 65%, no ischemia  . Bruit     CAROTID DOPPLER, 09/20/2008 - normal, no evidence of diameter reduction, significant tortuousity or any other vascular abnormality    Past Surgical History  Procedure Laterality Date  . Anal fissure repair    . Cardiac catheterization  02/02/2001    Normal LV systolic function, questionable mitral valve prolapse without mitral regurgitation, no evidence of CAD  . Cardiac catheterization  09/13/2008    Minimal CAD, normal LV systolic function, no evidence of renal artery stenosis, distal aortic disease, or iliac disease    Social History   Social History  . Marital Status: Married    Spouse Name: N/A  . Number of Children: N/A  . Years of Education: N/A   Occupational History  . Not on file.   Social History Main Topics  . Smoking status: Never Smoker   . Smokeless tobacco: Never Used  . Alcohol Use: No  . Drug Use: No  . Sexual Activity: Not on file   Other Topics Concern  . Not  on file   Social History Narrative   Non smoker    On disability    Current Outpatient Prescriptions on File Prior to Visit  Medication Sig Dispense Refill  . aspirin (ASPIRIN EC) 81 MG EC tablet Take 81 mg by mouth daily.      . B-D ULTRAFINE III SHORT PEN 31G X 8 MM MISC USE 4 TIMES DAILY BEFORE MEALS AND AT BEDTIME  12  . glucose blood (FREESTYLE TEST STRIPS) test strip 1 each by Other route 3 (three) times daily as needed for other. 100 each 12  . hydrochlorothiazide (HYDRODIURIL) 25 MG tablet Take 1 tablet (25 mg total) by mouth daily. 90 tablet 3  . Insulin Pen Needle 32G X 4 MM MISC 1 each by Does not apply route 4 (four) times daily -  before meals and at bedtime. 100 each 12  . latanoprost (XALATAN) 0.005 % ophthalmic solution Place 1 drop into both eyes daily.     Marland Kitchen lisinopril (PRINIVIL,ZESTRIL) 40 MG tablet TAKE 1 BY MOUTH DAILY 90 tablet 1  . lovastatin (MEVACOR) 40 MG tablet TAKE 1 BY MOUTH AT BEDTIME 90 tablet 1  . timolol (TIMOPTIC-XR) 0.5 % ophthalmic gel-forming Place 1 drop into both eyes daily.      No current facility-administered medications on file prior to visit.    Allergies  Allergen Reactions  .  Cheratussin Ac [Guaifenesin-Codeine] Nausea And Vomiting  . Verapamil     Family History  Problem Relation Age of Onset  . Diabetes Neg Hx     BP 132/80 mmHg  Pulse 82  Temp(Src) 98.2 F (36.8 C) (Oral)  Ht 6' (1.829 m)  Wt 241 lb (109.317 kg)  BMI 32.68 kg/m2  Review of Systems He denies LOC    Objective:   Physical Exam VITAL SIGNS:  See vs page GENERAL: no distress Pulses: dorsalis pedis intact bilat.   MSK: no deformity of the feet CV: 1+ bilat leg edema Skin:  no ulcer on the feet.  normal color and temp on the feet. Neuro: sensation is intact to touch on the feet, but decreased from normal  A1c=7.8%     Assessment & Plan:  DM: The pattern of his cbg's indicates he needs some adjustment in his therapy.  Patient is advised the  following: Patient Instructions  Please decrease the humalog to 3 times a day (just before each meal) 50-35-50 units, and: Increase the lantus to 60 units at bedtime.   check your blood sugar twice a day.  vary the time of day when you check, between before the 3 meals, and at bedtime.  also check if you have symptoms of your blood sugar being too high or too low.  please keep a record of the readings and bring it to your next appointment here.  You can write it on any piece of paper.  please call us sooner if your blood sugar goes below 70, or if you have a lot of readings over 200. Please come back for a follow-up appointment in 3 months.

## 2015-05-23 NOTE — Telephone Encounter (Signed)
Pt calling for lab results from 8/16, please advise.

## 2015-05-23 NOTE — Telephone Encounter (Signed)
Please call/notify patient that lab/test/procedure is normal  except blood sugar was elevated and kidney studies showed slight worsening.  Will need to repeat in 3 months at the time of his next hemoglobin A1c

## 2015-05-23 NOTE — Telephone Encounter (Signed)
Left message on voicemail to call office.  

## 2015-05-24 ENCOUNTER — Ambulatory Visit (HOSPITAL_COMMUNITY)
Admission: RE | Admit: 2015-05-24 | Discharge: 2015-05-24 | Disposition: A | Discharge status: Home / Self Care | Payer: Medicare Other | Source: Ambulatory Visit | Attending: Internal Medicine | Admitting: Internal Medicine

## 2015-05-24 DIAGNOSIS — I10 Essential (primary) hypertension: Secondary | ICD-10-CM | POA: Insufficient documentation

## 2015-05-24 DIAGNOSIS — H539 Unspecified visual disturbance: Secondary | ICD-10-CM

## 2015-05-24 DIAGNOSIS — I779 Disorder of arteries and arterioles, unspecified: Secondary | ICD-10-CM | POA: Diagnosis not present

## 2015-05-24 DIAGNOSIS — I2510 Atherosclerotic heart disease of native coronary artery without angina pectoris: Secondary | ICD-10-CM | POA: Insufficient documentation

## 2015-05-24 DIAGNOSIS — I739 Peripheral vascular disease, unspecified: Secondary | ICD-10-CM

## 2015-05-24 DIAGNOSIS — E785 Hyperlipidemia, unspecified: Secondary | ICD-10-CM | POA: Diagnosis not present

## 2015-05-24 DIAGNOSIS — E119 Type 2 diabetes mellitus without complications: Secondary | ICD-10-CM | POA: Insufficient documentation

## 2015-05-24 NOTE — Telephone Encounter (Signed)
error 

## 2015-05-25 ENCOUNTER — Encounter: Payer: Self-pay | Admitting: Internal Medicine

## 2015-05-25 ENCOUNTER — Ambulatory Visit (INDEPENDENT_AMBULATORY_CARE_PROVIDER_SITE_OTHER): Payer: Medicare Other | Admitting: Internal Medicine

## 2015-05-25 VITALS — BP 140/90 | HR 76 | Temp 98.5°F | Resp 20 | Ht 72.0 in | Wt 241.0 lb

## 2015-05-25 DIAGNOSIS — J069 Acute upper respiratory infection, unspecified: Secondary | ICD-10-CM | POA: Diagnosis not present

## 2015-05-25 DIAGNOSIS — I10 Essential (primary) hypertension: Secondary | ICD-10-CM

## 2015-05-25 DIAGNOSIS — R350 Frequency of micturition: Secondary | ICD-10-CM | POA: Diagnosis not present

## 2015-05-25 DIAGNOSIS — E0840 Diabetes mellitus due to underlying condition with diabetic neuropathy, unspecified: Secondary | ICD-10-CM | POA: Diagnosis not present

## 2015-05-25 MED ORDER — BENZONATATE 200 MG PO CAPS: 200.0000 mg | ORAL_CAPSULE | Freq: Two times a day (BID) | ORAL | Status: DC | PRN

## 2015-05-25 MED ORDER — METFORMIN HCL 1000 MG PO TABS: 1000.0000 mg | ORAL_TABLET | Freq: Two times a day (BID) | ORAL | Status: DC

## 2015-05-25 NOTE — Progress Notes (Signed)
Pre visit review using our clinic review tool, if applicable. No additional management support is needed unless otherwise documented below in the visit note. 

## 2015-05-25 NOTE — Patient Instructions (Signed)
Acute bronchitis symptoms for less than 10 days are generally not helped by antibiotics.  Take over-the-counter expectorants and cough medications such as  Mucinex DM.  Call if there is no improvement in 5 to 7 days or if  you develop worsening cough, fever, or new symptoms, such as shortness of breath or chest pain.   Please check your hemoglobin A1c every 3 months

## 2015-05-25 NOTE — Progress Notes (Signed)
Subjective:    Patient ID: Jimmy Carr, male    DOB: May 05, 1941, 74 y.o.   MRN: [id number]  HPI  74 year old patient who presents with a one-week history of sinus and chest congestion.  His most prominent symptom is cough which is only mildly productive.  No active wheezing.  Denies any fever or shortness of breath.  He has type 2 diabetes and has been followed by endocrinology.  He states a recent hemoglobin A1c was 7.8.  3 months ago 7.1   Past Medical History  Diagnosis Date  . Diabetes mellitus type II   . Hyperlipidemia   . Hypertension   . Peripheral neuropathy   . Cerebrovascular accident 2003    history of small vessal lacunar stroke 2003  . Glaucoma   . Other specified cardiac dysrhythmias(427.89)     PSVT  . AF (atrial fibrillation)     2D ECHO, 03/01/1987 - normal; NUCLEAR STRESS TEST, 04/10/2006 - EF 65%, no ischemia  . Bruit     CAROTID DOPPLER, 09/20/2008 - normal, no evidence of diameter reduction, significant tortuousity or any other vascular abnormality    Social History   Social History  . Marital Status: Married    Spouse Name: N/A  . Number of Children: N/A  . Years of Education: N/A   Occupational History  . Not on file.   Social History Main Topics  . Smoking status: Never Smoker   . Smokeless tobacco: Never Used  . Alcohol Use: No  . Drug Use: No  . Sexual Activity: Not on file   Other Topics Concern  . Not on file   Social History Narrative   Non smoker    On disability    Past Surgical History  Procedure Laterality Date  . Anal fissure repair    . Cardiac catheterization  02/02/2001    Normal LV systolic function, questionable mitral valve prolapse without mitral regurgitation, no evidence of CAD  . Cardiac catheterization  09/13/2008    Minimal CAD, normal LV systolic function, no evidence of renal artery stenosis, distal aortic disease, or iliac disease    Family History  Problem Relation Age of Onset  . Diabetes Neg Hx      Allergies  Allergen Reactions  . Cheratussin Ac [Guaifenesin-Codeine] Nausea And Vomiting  . Verapamil     Current Outpatient Prescriptions on File Prior to Visit  Medication Sig Dispense Refill  . aspirin (ASPIRIN EC) 81 MG EC tablet Take 81 mg by mouth daily.      . B-D ULTRAFINE III SHORT PEN 31G X 8 MM MISC USE 4 TIMES DAILY BEFORE MEALS AND AT BEDTIME  12  . glucose blood (FREESTYLE TEST STRIPS) test strip 1 each by Other route 3 (three) times daily as needed for other. 100 each 12  . hydrochlorothiazide (HYDRODIURIL) 25 MG tablet Take 1 tablet (25 mg total) by mouth daily. 90 tablet 3  . Insulin Glargine (LANTUS SOLOSTAR) 100 UNIT/ML Solostar Pen Inject 60 Units into the skin daily at 10 pm. 20 pen 1  . insulin lispro (HUMALOG KWIKPEN) 100 UNIT/ML KiwkPen 3 times a day (just before each meal) 50-35-50 units 45 mL 11  . Insulin Pen Needle 32G X 4 MM MISC 1 each by Does not apply route 4 (four) times daily -  before meals and at bedtime. 100 each 12  . latanoprost (XALATAN) 0.005 % ophthalmic solution Place 1 drop into both eyes daily.     Marland Kitchen lisinopril (PRINIVIL,ZESTRIL) 40  MG tablet TAKE 1 BY MOUTH DAILY 90 tablet 1  . lovastatin (MEVACOR) 40 MG tablet TAKE 1 BY MOUTH AT BEDTIME 90 tablet 1  . timolol (TIMOPTIC-XR) 0.5 % ophthalmic gel-forming Place 1 drop into both eyes daily.      No current facility-administered medications on file prior to visit.    BP 140/90 mmHg  Pulse 76  Temp(Src) 98.5 F (36.9 C) (Oral)  Resp 20  Ht 6' (1.829 m)  Wt 241 lb (109.317 kg)  BMI 32.68 kg/m2  SpO2 98%    Review of Systems  Constitutional: Positive for activity change and appetite change. Negative for fever, chills and fatigue.  HENT: Positive for congestion, postnasal drip and rhinorrhea. Negative for dental problem, ear pain, hearing loss, sore throat, tinnitus, trouble swallowing and voice change.   Eyes: Negative for pain, discharge and visual disturbance.  Respiratory:  Positive for cough. Negative for chest tightness, wheezing and stridor.   Cardiovascular: Negative for chest pain, palpitations and leg swelling.  Gastrointestinal: Negative for nausea, vomiting, abdominal pain, diarrhea, constipation, blood in stool and abdominal distention.  Genitourinary: Negative for urgency, hematuria, flank pain, discharge, difficulty urinating and genital sores.  Musculoskeletal: Negative for myalgias, back pain, joint swelling, arthralgias, gait problem and neck stiffness.  Skin: Negative for rash.  Neurological: Negative for dizziness, syncope, speech difficulty, weakness, numbness and headaches.  Hematological: Negative for adenopathy. Does not bruise/bleed easily.  Psychiatric/Behavioral: Negative for behavioral problems and dysphoric mood. The patient is not nervous/anxious.        Objective:   Physical Exam  Constitutional: He is oriented to person, place, and time. He appears well-developed.  HENT:  Head: Normocephalic.  Right Ear: External ear normal.  Left Ear: External ear normal.  Eyes: Conjunctivae and EOM are normal.  Neck: Normal range of motion.  Cardiovascular: Normal rate and normal heart sounds.   Pulmonary/Chest: Breath sounds normal.  Abdominal: Bowel sounds are normal.  Musculoskeletal: Normal range of motion. He exhibits no edema or tenderness.  Neurological: He is alert and oriented to person, place, and time.  Psychiatric: He has a normal mood and affect. His behavior is normal.          Assessment & Plan:   Viral URI with cough.  Will treat symptomatically Diabetes mellitus.  Will recheck hemoglobin A1c in 3 months Dyslipidemia.  Continue statin therapy

## 2015-06-20 ENCOUNTER — Other Ambulatory Visit: Payer: Self-pay | Admitting: Internal Medicine

## 2015-06-21 ENCOUNTER — Other Ambulatory Visit: Payer: Self-pay | Admitting: Internal Medicine

## 2015-07-03 ENCOUNTER — Encounter: Payer: Self-pay | Admitting: Internal Medicine

## 2015-07-03 ENCOUNTER — Ambulatory Visit (INDEPENDENT_AMBULATORY_CARE_PROVIDER_SITE_OTHER): Payer: Medicare Other | Admitting: Internal Medicine

## 2015-07-03 VITALS — BP 132/84 | HR 73 | Ht 72.0 in | Wt 239.2 lb

## 2015-07-03 DIAGNOSIS — I480 Paroxysmal atrial fibrillation: Secondary | ICD-10-CM

## 2015-07-03 DIAGNOSIS — I2510 Atherosclerotic heart disease of native coronary artery without angina pectoris: Secondary | ICD-10-CM | POA: Diagnosis not present

## 2015-07-03 DIAGNOSIS — E0840 Diabetes mellitus due to underlying condition with diabetic neuropathy, unspecified: Secondary | ICD-10-CM

## 2015-07-03 DIAGNOSIS — I2583 Coronary atherosclerosis due to lipid rich plaque: Principal | ICD-10-CM

## 2015-07-03 DIAGNOSIS — I10 Essential (primary) hypertension: Secondary | ICD-10-CM | POA: Diagnosis not present

## 2015-07-03 DIAGNOSIS — Z794 Long term (current) use of insulin: Secondary | ICD-10-CM

## 2015-07-03 NOTE — Progress Notes (Signed)
OFFICE NOTE  Chief Complaint:  Routine follow-up  Primary Care Physician: Nyoka Cowden, MD  HPI:  Jimmy Carr is a 74 year-old male with a history of PAF. His atrial fib actually started in the mid 80s. He was on Coumadin, beta blockers, and Lanoxin, and over the years, we have been able to wean him off his beta blockers. He is still on a low dose of Lanoxin and several years ago, we stopped the Coumadin, and his CHADS score at that time was 2, but with no atrial fibrillation in over 10 years, we stopped the Coumadin. He is on aspirin. He has had no recurrence of any atrial fib in over 15 years. He has no palpitations. No unusual shortness of breath. No chest pain. No exertional dyspnea. Minimal puffiness of his ankles at the end of the day. No dizziness, slurred speech, or blurred vision.  He is diabetic and has been so for over 30 years. His last hemoglobin A1c was 7.2. He now has a peripheral neuropathy that involves his feet, much greater than his hands. He had been tried on Neurontin with no real help. He checks his blood pressure at home. It has been under good control. He denies arthralgias or myalgias. He walks daily for at least 30 minutes, five to seven days a week. With this, he has no claudication, no unusual breathlessness. He has had no angina. He has diffuse T wave inversion on his EKG. He was catheterized in December 2009 that showed minimal coronary disease and normal LV function. He has no real complaints today, other than the increased cost of digoxin. Exactly unclear to me why he is on digoxin, as this medicine is unlikely to keep him out of atrial fibrillation. He does have a high CHADS2 score which is 4, despite in frequent atrial fibrillation. He continues on aspirin.  There are no new complaints of chest pain or worsening shortness of breath.  [name] recently saw [name] in the office for what he thought was atypical chest pain. He recommended a  nonsteroidal. He is also reported more recent palpitations. He's been under significant stress recently. Today he called the office several times and spoke with our triage nurse about concern for atrial fibrillation. I did advise him that he may need to come in for an EKG to see if is actually in atrial fibrillation. Ultimately he did decide to do that.  [name] returns today and is doing well. He is asymptomatic and has no more palpitations. We discussed to monitor his last visit if his symptoms did not improve but they seem to have improved. Overall he feels like he did well. He denies any chest pain. He reports good blood sugar control with his diabetes.  I saw [name] back today in the office for follow-up. Overall he seems to be doing fairly well. He had recent laboratory work which shows an improvement in his lipid profile, however triglycerides remain elevated over 250. This is likely related to poorly controlled diabetes with an A1c of 7.8. He needs to continue to work on weight loss, exercise and reducing carbohydrates. He has not had any recurrent atrial fibrillation in over 15 years. Despite an elevated CHADSVASC score due to his low burden of A. fib he is only on aspirin therapy.  PMHx:  Past Medical History  Diagnosis Date  . Diabetes mellitus type II   . Hyperlipidemia   . Hypertension   . Peripheral neuropathy ([place])   .  Cerebrovascular accident [facility]) 2003    history of small vessal lacunar stroke 2003  . Glaucoma   . Other specified cardiac dysrhythmias(427.89)     PSVT  . AF (atrial fibrillation) ([place])     2D ECHO, 03/01/1987 - normal; NUCLEAR STRESS TEST, 04/10/2006 - EF 65%, no ischemia  . Bruit     CAROTID DOPPLER, 09/20/2008 - normal, no evidence of diameter reduction, significant tortuousity or any other vascular abnormality    Past Surgical History  Procedure Laterality Date  . Anal fissure repair    . Cardiac catheterization  02/02/2001    Normal LV systolic  function, questionable mitral valve prolapse without mitral regurgitation, no evidence of CAD  . Cardiac catheterization  09/13/2008    Minimal CAD, normal LV systolic function, no evidence of renal artery stenosis, distal aortic disease, or iliac disease    FAMHx:  Family History  Problem Relation Age of Onset  . Diabetes Neg Hx     SOCHx:   reports that he has never smoked. He has never used smokeless tobacco. He reports that he does not drink alcohol or use illicit drugs.  ALLERGIES:  Allergies  Allergen Reactions  . Cheratussin Ac [Guaifenesin-Codeine] Nausea And Vomiting  . Verapamil     ROS: A comprehensive review of systems was negative.  HOME MEDS: Current Outpatient Prescriptions  Medication Sig Dispense Refill  . aspirin (ASPIRIN EC) 81 MG EC tablet Take 81 mg by mouth daily.      . B-D ULTRAFINE III SHORT PEN 31G X 8 MM MISC USE 4 TIMES DAILY BEFORE MEALS AND AT BEDTIME  12  . benzonatate (TESSALON) 200 MG capsule Take 1 capsule (200 mg total) by mouth 2 (two) times daily as needed for cough. 20 capsule 0  . glimepiride (AMARYL) 4 MG tablet TAKE 1 BY MOUTH DAILY BEFORE BREAKFAST 90 tablet 0  . glucose blood (FREESTYLE TEST STRIPS) test strip 1 each by Other route 3 (three) times daily as needed for other. 100 each 12  . hydrochlorothiazide (HYDRODIURIL) 25 MG tablet TAKE 1 BY MOUTH DAILY 90 tablet 0  . Insulin Glargine (LANTUS SOLOSTAR) 100 UNIT/ML Solostar Pen Inject 60 Units into the skin daily at 10 pm. 20 pen 1  . insulin lispro (HUMALOG KWIKPEN) 100 UNIT/ML KiwkPen 3 times a day (just before each meal) 50-35-50 units 45 mL 11  . Insulin Pen Needle 32G X 4 MM MISC 1 each by Does not apply route 4 (four) times daily -  before meals and at bedtime. 100 each 12  . latanoprost (XALATAN) 0.005 % ophthalmic solution Place 1 drop into both eyes daily.     Marland Kitchen lisinopril (PRINIVIL,ZESTRIL) 40 MG tablet TAKE 1 BY MOUTH DAILY 90 tablet 2  . lovastatin (MEVACOR) 40 MG tablet  TAKE 1 BY MOUTH AT BEDTIME 90 tablet 0  . metFORMIN (GLUCOPHAGE) 1000 MG tablet Take 1 tablet (1,000 mg total) by mouth 2 (two) times daily with a meal. 180 tablet 3  . timolol (TIMOPTIC-XR) 0.5 % ophthalmic gel-forming Place 1 drop into both eyes daily.      No current facility-administered medications for this visit.    LABS/IMAGING: No results found for this or any previous visit (from the past 48 hour(s)). No results found.  VITALS: BP 132/84 mmHg  Pulse 73  Ht 6' (1.829 m)  Wt 239 lb 3.2 oz (108.5 kg)  BMI 32.43 kg/m2  EXAM: General appearance: alert and no distress Neck: no carotid bruit and no JVD  Lungs: clear to auscultation bilaterally Heart: regular rate and rhythm, S1, S2 normal, no murmur, click, rub or gallop Abdomen: soft, non-tender; bowel sounds normal; no masses,  no organomegaly Extremities: extremities normal, atraumatic, no cyanosis or edema Pulses: 2+ and symmetric Skin: Skin color, texture, turgor normal. No rashes or lesions Neurologic: Grossly normal Psych: Normal  EKG: Normal sinus rhythm at 73, nonspecific T wave changes  ASSESSMENT: 1. Remote history of paroxysmal atrial fibrillation >15 years ago - on aspirin 2. Hypertension 3. History of stroke, with right central vision loss 4. Type 2 diabetes on insulin 5. Dyslipidemia  PLAN: 1.   Mr. Fontenette is asymptomatic at this time. He is not had recurrent palpitations and no evidence of recurrent A. fib for over 15 years. He is currently managed on aspirin monotherapy despite an elevated CHADSVASC score of 4. He understands that if he has recurrent palpitations or signs of A. fib that he should contact us and we'll likely need to be started on a direct oral anticoagulant. Blood pressure is well-controlled. I diabetes needs to be improved with recent A1c of 7.8. He's also had weight gain. He has elevated triglycerides likely related to his diabetes. I would not recommend changing his medicines at this time  and he should continue to work on better glycemic control.  Plan to see him back annually or sooner as necessary.  Pixie Casino, MD, [facility] Attending Cardiologist Dallam C Hilty 07/03/2015, 9:43 AM

## 2015-07-03 NOTE — Patient Instructions (Signed)
Your physician wants you to follow-up in: 1 year with Dr. Hilty. You will receive a reminder letter in the mail two months in advance. If you don't receive a letter, please call our office to schedule the follow-up appointment.  

## 2015-08-07 DIAGNOSIS — H34211 Partial retinal artery occlusion, right eye: Secondary | ICD-10-CM | POA: Diagnosis not present

## 2015-08-07 DIAGNOSIS — H5213 Myopia, bilateral: Secondary | ICD-10-CM | POA: Diagnosis not present

## 2015-08-07 DIAGNOSIS — H401133 Primary open-angle glaucoma, bilateral, severe stage: Secondary | ICD-10-CM | POA: Diagnosis not present

## 2015-08-07 DIAGNOSIS — H52203 Unspecified astigmatism, bilateral: Secondary | ICD-10-CM | POA: Diagnosis not present

## 2015-08-28 ENCOUNTER — Ambulatory Visit: Payer: Medicare Other | Admitting: Endocrinology

## 2015-10-15 ENCOUNTER — Other Ambulatory Visit: Payer: Self-pay | Admitting: Endocrinology

## 2015-10-19 ENCOUNTER — Encounter: Payer: Self-pay | Admitting: Internal Medicine

## 2015-10-19 ENCOUNTER — Ambulatory Visit (INDEPENDENT_AMBULATORY_CARE_PROVIDER_SITE_OTHER): Payer: Medicare Other | Admitting: Internal Medicine

## 2015-10-19 VITALS — BP 130/80 | HR 77 | Temp 98.6°F | Resp 20 | Ht 72.0 in | Wt 245.0 lb

## 2015-10-19 DIAGNOSIS — I10 Essential (primary) hypertension: Secondary | ICD-10-CM | POA: Diagnosis not present

## 2015-10-19 DIAGNOSIS — E0840 Diabetes mellitus due to underlying condition with diabetic neuropathy, unspecified: Secondary | ICD-10-CM | POA: Diagnosis not present

## 2015-10-19 DIAGNOSIS — Z794 Long term (current) use of insulin: Secondary | ICD-10-CM

## 2015-10-19 DIAGNOSIS — R1011 Right upper quadrant pain: Secondary | ICD-10-CM

## 2015-10-19 LAB — HEMOGLOBIN A1C: Hgb A1c MFr Bld: 7.1 % — ABNORMAL HIGH (ref 4.6–6.5)

## 2015-10-19 MED ORDER — LISINOPRIL 40 MG PO TABS: ORAL_TABLET | ORAL | Status: DC

## 2015-10-19 MED ORDER — BD PEN NEEDLE SHORT U/F 31G X 8 MM MISC: Status: DC

## 2015-10-19 MED ORDER — GLIMEPIRIDE 4 MG PO TABS: ORAL_TABLET | ORAL | Status: DC

## 2015-10-19 MED ORDER — INSULIN GLARGINE 100 UNIT/ML SOLOSTAR PEN: PEN_INJECTOR | SUBCUTANEOUS | Status: DC

## 2015-10-19 MED ORDER — HYDROCHLOROTHIAZIDE 25 MG PO TABS
ORAL_TABLET | ORAL | Status: DC
Start: 2015-10-19 — End: 2015-11-03

## 2015-10-19 MED ORDER — INSULIN LISPRO 100 UNIT/ML (KWIKPEN): PEN_INJECTOR | SUBCUTANEOUS | Status: DC

## 2015-10-19 MED ORDER — METFORMIN HCL 1000 MG PO TABS
1000.0000 mg | ORAL_TABLET | Freq: Two times a day (BID) | ORAL | Status: DC
Start: 2015-10-19 — End: 2016-10-04

## 2015-10-19 MED ORDER — GLUCOSE BLOOD VI STRP: 1.0000 | ORAL_STRIP | Freq: Three times a day (TID) | Status: DC | PRN

## 2015-10-19 MED ORDER — LOVASTATIN 40 MG PO TABS: ORAL_TABLET | ORAL | Status: DC

## 2015-10-19 NOTE — Progress Notes (Signed)
Subjective:    Patient ID: Jimmy Carr, male    DOB: 1941-07-10, 75 y.o.   MRN: [id number]  HPI  Lab Results  Component Value Date   HGBA1C 7.8 05/23/2015   75 year old patient who presents with a two-week history of right upper quadrant pain.  He states that the pain is somewhat migratory and not located to a specific position.  Denies any associated nausea, vomiting or change in his bowel habits.  He has history of hypertension  .  He is followed by endocrinology for diabetes.  He states more recently blood sugars have been improved.  He states he has occasional blood sugars less than 100.  He remains on Lantus 50 units at bedtime and 40-50 units prior to each meal.  No hypoglycemia  Past Medical History  Diagnosis Date  . Diabetes mellitus type II   . Hyperlipidemia   . Hypertension   . Peripheral neuropathy ([place])   . Cerebrovascular accident [facility] 2003    history of small vessal lacunar stroke 2003  . Glaucoma   . Other specified cardiac dysrhythmias(427.89)     PSVT  . AF (atrial fibrillation) ([place])     2D ECHO, 03/01/1987 - normal; NUCLEAR STRESS TEST, 04/10/2006 - EF 65%, no ischemia  . Bruit     CAROTID DOPPLER, 09/20/2008 - normal, no evidence of diameter reduction, significant tortuousity or any other vascular abnormality    Social History   Social History  . Marital Status: Married    Spouse Name: N/A  . Number of Children: N/A  . Years of Education: N/A   Occupational History  . Not on file.   Social History Main Topics  . Smoking status: Never Smoker   . Smokeless tobacco: Never Used  . Alcohol Use: No  . Drug Use: No  . Sexual Activity: Not on file   Other Topics Concern  . Not on file   Social History Narrative   Non smoker    On disability    Past Surgical History  Procedure Laterality Date  . Anal fissure repair    . Cardiac catheterization  02/02/2001    Normal LV systolic function, questionable mitral valve prolapse without mitral  regurgitation, no evidence of CAD  . Cardiac catheterization  09/13/2008    Minimal CAD, normal LV systolic function, no evidence of renal artery stenosis, distal aortic disease, or iliac disease    Family History  Problem Relation Age of Onset  . Diabetes Neg Hx     Allergies  Allergen Reactions  . Cheratussin Ac [Guaifenesin-Codeine] Nausea And Vomiting  . Verapamil     Current Outpatient Prescriptions on File Prior to Visit  Medication Sig Dispense Refill  . aspirin (ASPIRIN EC) 81 MG EC tablet Take 81 mg by mouth daily.      . benzonatate (TESSALON) 200 MG capsule Take 1 capsule (200 mg total) by mouth 2 (two) times daily as needed for cough. 20 capsule 0  . latanoprost (XALATAN) 0.005 % ophthalmic solution Place 1 drop into both eyes daily.     . timolol (TIMOPTIC-XR) 0.5 % ophthalmic gel-forming Place 1 drop into both eyes daily.      No current facility-administered medications on file prior to visit.    BP 130/80 mmHg  Pulse 77  Temp(Src) 98.6 F (37 C) (Oral)  Resp 20  Ht 6' (1.829 m)  Wt 245 lb (111.131 kg)  BMI 33.22 kg/m2  SpO2 98%      Review of  Systems  Constitutional: Negative for fever, chills, appetite change and fatigue.  HENT: Negative for congestion, dental problem, ear pain, hearing loss, sore throat, tinnitus, trouble swallowing and voice change.   Eyes: Negative for pain, discharge and visual disturbance.  Respiratory: Negative for cough, chest tightness, wheezing and stridor.   Cardiovascular: Negative for chest pain, palpitations and leg swelling.  Gastrointestinal: Positive for abdominal pain. Negative for nausea, vomiting, diarrhea, constipation, blood in stool and abdominal distention.  Genitourinary: Negative for urgency, hematuria, flank pain, discharge, difficulty urinating and genital sores.  Musculoskeletal: Negative for myalgias, back pain, joint swelling, arthralgias, gait problem and neck stiffness.  Skin: Negative for rash.    Neurological: Negative for dizziness, syncope, speech difficulty, weakness, numbness and headaches.  Hematological: Negative for adenopathy. Does not bruise/bleed easily.  Psychiatric/Behavioral: Negative for behavioral problems and dysphoric mood. The patient is not nervous/anxious.        Objective:   Physical Exam  Constitutional: He is oriented to person, place, and time. He appears well-developed.  HENT:  Head: Normocephalic.  Right Ear: External ear normal.  Left Ear: External ear normal.  Eyes: Conjunctivae and EOM are normal.  Neck: Normal range of motion.  Cardiovascular: Normal rate and normal heart sounds.   Pulmonary/Chest: Effort normal and breath sounds normal.  Abdominal: Soft. Bowel sounds are normal. He exhibits no distension. There is no tenderness. There is no rebound and no guarding.  Obese Suggestion of a liver edge on inspiration Otherwise normal examination  Musculoskeletal: Normal range of motion. He exhibits no edema or tenderness.  Neurological: He is alert and oriented to person, place, and time.  Psychiatric: He has a normal mood and affect. His behavior is normal.          Assessment & Plan:   .  Intermittent right upper quadrant pain.  Very intermittent.  Patient has not had screening for abdominal aortic aneurysm.  Will screen with a complete abdominal ultrasound Diabetes mellitus.  Will check a hemoglobin A1c.  Follow-up endocrinology Dyslipidemia Hypertension, stable  Recheck 6 months or as needed

## 2015-10-19 NOTE — Patient Instructions (Signed)
Limit your sodium (Salt) intake  Call or return to clinic prn if these symptoms worsen or fail to improve as anticipated.  Abdominal ultrasound as discussed

## 2015-10-19 NOTE — Progress Notes (Signed)
Pre visit review using our clinic review tool, if applicable. No additional management support is needed unless otherwise documented below in the visit note. 

## 2015-10-30 ENCOUNTER — Ambulatory Visit
Admission: RE | Admit: 2015-10-30 | Discharge: 2015-10-30 | Disposition: A | Discharge status: Home / Self Care | Payer: Medicare Other | Source: Ambulatory Visit | Attending: Internal Medicine | Admitting: Internal Medicine

## 2015-10-30 DIAGNOSIS — R1011 Right upper quadrant pain: Secondary | ICD-10-CM | POA: Diagnosis not present

## 2015-10-31 ENCOUNTER — Telehealth: Payer: Self-pay | Admitting: Hematology

## 2015-10-31 ENCOUNTER — Other Ambulatory Visit: Payer: Self-pay | Admitting: Internal Medicine

## 2015-10-31 DIAGNOSIS — R161 Splenomegaly, not elsewhere classified: Secondary | ICD-10-CM

## 2015-10-31 NOTE — Telephone Encounter (Signed)
new patient appt-s/w patient and gave np appt for 02/01 @ 9:30 w/Dr. Irene Limbo Referring Dr. Bluford Kaufmann Dx- splenomegaly

## 2015-11-01 ENCOUNTER — Telehealth: Payer: Self-pay | Admitting: Hematology

## 2015-11-01 ENCOUNTER — Ambulatory Visit (HOSPITAL_BASED_OUTPATIENT_CLINIC_OR_DEPARTMENT_OTHER): Payer: Medicare Other

## 2015-11-01 ENCOUNTER — Ambulatory Visit (HOSPITAL_BASED_OUTPATIENT_CLINIC_OR_DEPARTMENT_OTHER): Payer: Medicare Other | Admitting: Hematology

## 2015-11-01 ENCOUNTER — Ambulatory Visit (HOSPITAL_COMMUNITY)
Admission: RE | Admit: 2015-11-01 | Discharge: 2015-11-01 | Disposition: A | Discharge status: Home / Self Care | Payer: Medicare Other | Source: Ambulatory Visit | Attending: Hematology | Admitting: Hematology

## 2015-11-01 ENCOUNTER — Encounter: Payer: Self-pay | Admitting: Hematology

## 2015-11-01 VITALS — BP 133/68 | HR 92 | Temp 99.1°F | Resp 19 | Ht 72.0 in | Wt 244.5 lb

## 2015-11-01 DIAGNOSIS — K7460 Unspecified cirrhosis of liver: Secondary | ICD-10-CM | POA: Insufficient documentation

## 2015-11-01 DIAGNOSIS — R161 Splenomegaly, not elsewhere classified: Secondary | ICD-10-CM

## 2015-11-01 DIAGNOSIS — M7989 Other specified soft tissue disorders: Secondary | ICD-10-CM | POA: Insufficient documentation

## 2015-11-01 DIAGNOSIS — D696 Thrombocytopenia, unspecified: Secondary | ICD-10-CM | POA: Diagnosis not present

## 2015-11-01 DIAGNOSIS — R188 Other ascites: Secondary | ICD-10-CM | POA: Insufficient documentation

## 2015-11-01 DIAGNOSIS — K769 Liver disease, unspecified: Secondary | ICD-10-CM | POA: Diagnosis not present

## 2015-11-01 LAB — COMPREHENSIVE METABOLIC PANEL
ALK PHOS: 66 U/L (ref 40–150)
ALT: 22 U/L (ref 0–55)
ANION GAP: 12 meq/L — AB (ref 3–11)
AST: 15 U/L (ref 5–34)
Albumin: 4.2 g/dL (ref 3.5–5.0)
BILIRUBIN TOTAL: 1.5 mg/dL — AB (ref 0.20–1.20)
BUN: 20.2 mg/dL (ref 7.0–26.0)
CALCIUM: 9.7 mg/dL (ref 8.4–10.4)
CO2: 25 mEq/L (ref 22–29)
CREATININE: 1.4 mg/dL — AB (ref 0.7–1.3)
Chloride: 100 mEq/L (ref 98–109)
EGFR: 49 mL/min/{1.73_m2} — AB (ref >90)
Glucose: 170 mg/dl — ABNORMAL HIGH (ref 70–140)
Potassium: 4.3 mEq/L (ref 3.5–5.1)
Sodium: 137 mEq/L (ref 136–145)
TOTAL PROTEIN: 7.8 g/dL (ref 6.4–8.3)

## 2015-11-01 LAB — CBC & DIFF AND RETIC
BASO%: 0.3 % (ref 0.0–2.0)
BASOS ABS: 0 10*3/uL (ref 0.0–0.1)
EOS%: 1.9 % (ref 0.0–7.0)
Eosinophils Absolute: 0.2 10*3/uL (ref 0.0–0.5)
HEMATOCRIT: 45.6 % (ref 38.4–49.9)
HGB: 15.5 g/dL (ref 13.0–17.1)
IMMATURE RETIC FRACT: 4.3 % (ref 3.00–10.60)
LYMPH#: 1.9 10*3/uL (ref 0.9–3.3)
LYMPH%: 17.5 % (ref 14.0–49.0)
MCH: 27.9 pg (ref 27.2–33.4)
MCHC: 34 g/dL (ref 32.0–36.0)
MCV: 82.2 fL (ref 79.3–98.0)
MONO#: 1.5 10*3/uL — AB (ref 0.1–0.9)
MONO%: 13.6 % (ref 0.0–14.0)
NEUT#: 7.2 10*3/uL — ABNORMAL HIGH (ref 1.5–6.5)
NEUT%: 66.7 % (ref 39.0–75.0)
PLATELETS: 138 10*3/uL — AB (ref 140–400)
RBC: 5.55 10*6/uL (ref 4.20–5.82)
RDW: 14.4 % (ref 11.0–14.6)
RETIC CT ABS: 97.13 10*3/uL — AB (ref 34.80–93.90)
Retic %: 1.75 % (ref 0.80–1.80)
WBC: 10.8 10*3/uL — ABNORMAL HIGH (ref 4.0–10.3)

## 2015-11-01 LAB — LACTATE DEHYDROGENASE: LDH: 180 U/L (ref 125–245)

## 2015-11-01 NOTE — Progress Notes (Signed)
*  PRELIMINARY RESULTS* Vascular Ultrasound Lower extremity venous duplex has been completed.  Preliminary findings: negative for DVT. Baker's cyst on the right.  Left voice message with results on Dr. Grier Mitts RN line.   Landry Mellow, RDMS, RVT  11/01/2015, 12:25 PM

## 2015-11-01 NOTE — Telephone Encounter (Signed)
Patient given avs report and appointments for May - central radiology scheduling will contact patient re [place] due in May - patient aware. Patient sent back to lab and then to vascular lab for doppler.

## 2015-11-01 NOTE — Progress Notes (Signed)
Marland Kitchen    HEMATOLOGY/ONCOLOGY CONSULTATION NOTE  Date of Service: 11/01/2015  Patient Care Team: Marletta Lor, MD as PCP - General  CHIEF COMPLAINTS/PURPOSE OF CONSULTATION:  Splenomegaly  HISTORY OF PRESENTING ILLNESS:   Jimmy Carr is a wonderful 75 y.o. male who has been referred to [place] by Dr Nyoka Cowden, MD  for evaluation and management of splenomegaly.  Patient has a history of hypertension, dyslipidemia, small lacunar CVA in 2003, atrial fibrillation who presented to his primary care physician on 10/19/2015 with 2 weeks of right upper quadrant abdominal pain which was poorly localized and not associated with nausea vomiting change in bowel habits or food. He subsequently had an abdominal ultrasound with his primary care physician to evaluate his abdominal pain and also for screening for abdominal aortic aneurysm.  Patient on ultrasound abdomen on 10/30/2015 which showed mild splenomegaly with the spleen 14 cm in length and an echogenic appearing liver consistent with fatty infiltration or hepatocellular disease. No abdominal aortic aneurysm was noted.  Patient was referred to [place] for further evaluation of splenomegaly. Patient does not report any focal abdominal pain at this time. No fevers no chills no night sweats no unexpected weight loss no obvious lymph node enlargement. No change in bowel habits.  CBC showed a normal hemoglobin of 15.5 with an MCV of 82, mild leukocytosis of 10.8k with neutrophils of 7.2k, borderline reduced platelets of 138k, no lymphocytosis. Mild monocytosis of 1500.  He also reports right foot and calf swelling for which an ultrasound was ordered. No history of gout.   MEDICAL HISTORY:  Past Medical History  Diagnosis Date  . Diabetes mellitus type II   . Hyperlipidemia   . Hypertension   . Peripheral neuropathy ([place])   . Cerebrovascular accident [facility]) 2003    history of small vessal lacunar stroke 2003  . Glaucoma   . Other  specified cardiac dysrhythmias(427.89)     PSVT  . AF (atrial fibrillation) ([place])     2D ECHO, 03/01/1987 - normal; NUCLEAR STRESS TEST, 04/10/2006 - EF 65%, no ischemia  . Bruit     CAROTID DOPPLER, 09/20/2008 - normal, no evidence of diameter reduction, significant tortuousity or any other vascular abnormality    SURGICAL HISTORY: Past Surgical History  Procedure Laterality Date  . Anal fissure repair    . Cardiac catheterization  02/02/2001    Normal LV systolic function, questionable mitral valve prolapse without mitral regurgitation, no evidence of CAD  . Cardiac catheterization  09/13/2008    Minimal CAD, normal LV systolic function, no evidence of renal artery stenosis, distal aortic disease, or iliac disease    SOCIAL HISTORY: Social History   Social History  . Marital Status: Married    Spouse Name: N/A  . Number of Children: N/A  . Years of Education: N/A   Occupational History  . Not on file.   Social History Main Topics  . Smoking status: Never Smoker   . Smokeless tobacco: Never Used  . Alcohol Use: No  . Drug Use: No  . Sexual Activity: Not on file   Other Topics Concern  . Not on file   Social History Narrative   Non smoker    On disability    FAMILY HISTORY: Family History  Problem Relation Age of Onset  . Diabetes Neg Hx     ALLERGIES:  is allergic to cheratussin ac and verapamil.  MEDICATIONS:  Current Outpatient Prescriptions  Medication Sig Dispense Refill  . aspirin (ASPIRIN EC)  81 MG EC tablet Take 81 mg by mouth daily.      . B-D ULTRAFINE III SHORT PEN 31G X 8 MM MISC USE 4 TIMES DAILY BEFORE MEALS AND AT BEDTIME 100 each 12  . benzonatate (TESSALON) 200 MG capsule Take 1 capsule (200 mg total) by mouth 2 (two) times daily as needed for cough. 20 capsule 0  . glimepiride (AMARYL) 4 MG tablet TAKE 1 BY MOUTH DAILY BEFORE BREAKFAST 90 tablet 1  . glucose blood (FREESTYLE TEST STRIPS) test strip 1 each by Other route 3 (three) times daily as  needed for other. 100 each 12  . hydrochlorothiazide (HYDRODIURIL) 25 MG tablet TAKE 1 BY MOUTH DAILY 90 tablet 1  . Insulin Glargine (LANTUS SOLOSTAR) 100 UNIT/ML Solostar Pen INJECT 60 UNITS IN SKIN AT 10 PM 60 pen 3  . insulin lispro (HUMALOG KWIKPEN) 100 UNIT/ML KiwkPen 3 times a day (just before each meal) 50-35-50 units 50 pen 3  . latanoprost (XALATAN) 0.005 % ophthalmic solution Place 1 drop into both eyes daily.     Marland Kitchen lisinopril (PRINIVIL,ZESTRIL) 40 MG tablet TAKE 1 BY MOUTH DAILY 90 tablet 1  . lovastatin (MEVACOR) 40 MG tablet TAKE 1 BY MOUTH AT BEDTIME 90 tablet 1  . metFORMIN (GLUCOPHAGE) 1000 MG tablet Take 1 tablet (1,000 mg total) by mouth 2 (two) times daily with a meal. 180 tablet 1  . timolol (TIMOPTIC-XR) 0.5 % ophthalmic gel-forming Place 1 drop into both eyes daily.      No current facility-administered medications for this visit.    REVIEW OF SYSTEMS:    10 Point review of Systems was done is negative except as noted above.  PHYSICAL EXAMINATION: ECOG PERFORMANCE STATUS: 1 - Symptomatic but completely ambulatory  . Filed Vitals:   11/01/15 0940  BP: 133/68  Pulse: 92  Temp: 99.1 F (37.3 C)  Resp: 19   Filed Weights   11/01/15 0940  Weight: 244 lb 8 oz (110.904 kg)   .Body mass index is 33.15 kg/(m^2).  GENERAL:alert, in no acute distress and comfortable SKIN: skin color, texture, turgor are normal, no rashes or significant lesions EYES: normal, conjunctiva are pink and non-injected, sclera clear OROPHARYNX:no exudate, no erythema and lips, buccal mucosa, and tongue normal  NECK: supple, no JVD, thyroid normal size, non-tender, without nodularity LYMPH:  no palpable lymphadenopathy in the cervical, axillary or inguinal  LUNGS: clear to auscultation with normal respiratory effort HEART: regular rate & rhythm,  no murmurs and no lower extremity edema ABDOMEN: abdomen, obese soft, non-tender, normoactive bowel sounds no palpable  hepatosplenomegaly Musculoskeletal: no cyanosis of digits and no clubbing  PSYCH: alert & oriented x 3 with fluent speech NEURO: no focal motor/sensory deficits  LABORATORY DATA:  I have reviewed the data as listed  Component     Latest Ref Rng 11/01/2015  WBC     4.0 - 10.3 10e3/uL 10.8 (H)  NEUT#     1.5 - 6.5 10e3/uL 7.2 (H)  Hemoglobin     13.0 - 17.1 g/dL 15.5  HCT     38.4 - 49.9 % 45.6  Platelets     140 - 400 10e3/uL 138 (L)  MCV     79.3 - 98.0 fL 82.2  MCH     27.2 - 33.4 pg 27.9  MCHC     32.0 - 36.0 g/dL 34.0  RBC     4.20 - 5.82 10e6/uL 5.55  RDW     11.0 - 14.6 % 14.4  lymph#     0.9 - 3.3 10e3/uL 1.9  MONO#     0.1 - 0.9 10e3/uL 1.5 (H)  Eosinophils Absolute     0.0 - 0.5 10e3/uL 0.2  Basophils Absolute     0.0 - 0.1 10e3/uL 0.0  NEUT%     39.0 - 75.0 % 66.7  LYMPH%     14.0 - 49.0 % 17.5  MONO%     0.0 - 14.0 % 13.6  EOS%     0.0 - 7.0 % 1.9  BASO%     0.0 - 2.0 % 0.3  Retic %     0.80 - 1.80 % 1.75  Retic Ct Abs     34.80 - 93.90 10e3/uL 97.13 (H)  Immature Retic Fract     3.00 - 10.60 % 4.30  Sodium     136 - 145 mEq/L 137  Potassium     3.5 - 5.1 mEq/L 4.3  Chloride     98 - 109 mEq/L 100  CO2     22 - 29 mEq/L 25  Glucose     70 - 140 mg/dl 170 (H)  BUN     7.0 - 26.0 mg/dL 20.2  Creatinine     0.7 - 1.3 mg/dL 1.4 (H)  Total Bilirubin     0.20 - 1.20 mg/dL 1.50 (H)  Alkaline Phosphatase     40 - 150 U/L 66  AST     5 - 34 U/L 15  ALT     0 - 55 U/L 22  Total Protein     6.4 - 8.3 g/dL 7.8  Albumin     3.5 - 5.0 g/dL 4.2  Calcium     8.4 - 10.4 mg/dL 9.7  Anion gap     3 - 11 mEq/L 12 (H)  EGFR     >90 ml/min/1.73 m2 49 (L)  LDH     125 - 245 U/L 180  Sed Rate     0 - 30 mm/hr 11  CRP     0.0 - 4.9 mg/L 31.4 (H)  Hep C Virus Ab     0.0 - 0.9 s/co ratio <0.1     RADIOGRAPHIC STUDIES: I have personally reviewed the radiological images as listed and agreed with the findings in the report. US Abdomen  Complete  10/30/2015  CLINICAL DATA:  Right upper quadrant pain . EXAM: ABDOMEN ULTRASOUND COMPLETE COMPARISON:  None. FINDINGS: Gallbladder: No gallstones or wall thickening visualized. No sonographic Murphy sign noted by sonographer. Common bile duct: Diameter: 4.3 mm Liver: Liver is echogenic consistent fatty infiltration and/or hepatocellular disease. No focal hepatic abnormality identified. IVC: No abnormality visualized. Pancreas: Visualized portion unremarkable. Spleen: Spleen is enlarged 14 cm with a volume of 80 64 cubic cm. Right Kidney: Length: 11.7 cm. Echogenicity within normal limits. No mass or hydronephrosis visualized. Left Kidney: Length: 12.7 cm. Echogenicity within normal limits. No mass or hydronephrosis visualized. Abdominal aorta: No aneurysm visualized. Other findings: None. IMPRESSION: 1. Liver is again noted to be echogenic consistent with fatty infiltration and/or hepatocellular disease. No focal hepatic abnormality identified. 2.  Splenomegaly. Electronically Signed   By: Marcello Moores  Register   On: 10/30/2015 11:01   [place] lower extremity venous bilateral 11/01/2015:  Summary:  - No evidence of deep vein thrombosis involving the visualized  veins of the right lower extremity and left lower extremity.  Incidental findings are consistent with: enlarged lymph node on  the right and enlarged lymph node on  the left. - . Incidental findings are consistent with: Baker&'s Cyst on the  right. - No evidence of Baker&'s cyst on the left.   ASSESSMENT & PLAN:   75 year old male with  #1 incidentally noted Mild Splenomegaly. Patient was having some right upper quadrant abdominal discomfort and had done ultrasound to evaluate this and for screening for AAA and was noted to have an incidental mild splenomegaly of 14 cm. Could be secondary to his liver disease or a passing viral infection. His inflammatory markers were mixed with a normal sedimentation rate elevated CRP.  His  monocytosis suggests possible subclinical recent viral infection. No palpable lymphadenopathy. No lymphocytosis. LDH within normal limits. All of which makes a lymphoproliferative disorder less likely. His blood counts are stable with no polycythemia or thrombocytosis. Mild leucocytosis without focal symptoms. Overall not suggestive of myeloproliferative neoplasm at this time.  #2 mild thrombocytopenia could be from a passing viral infection. Interval re-assessment will tell us if this might be related to his mild splenomegaly or liver disease. Hepatitis C negative serology.  #3 right lower extremity swelling no evidence of DVT or superficial thrombophlebitis. Could be from venous insufficiency. If increasing or he develops redness/pain might need further workup with primary care physician. Could become inflamed Baker cyst.  #4 elevated CRP unclear etiology normal sedimentation rate.? Baker's cyst inflammation. -Monitor for new focal symptoms fevers or chills. Repeat CRP on next visit  #5 enlarged bilateral inguinal lymph nodes - noted incidentally on ultrasound to rule out DVT. Not easily palpable clinically. Continue to monitor. If clinically enlarged are increasing in size would need further workup.  Return to get in 3 months with Dr. Irene Limbo with repeat CBC, CMP, crp, myeloma panel and rpt [place] abd  Orders Placed This Encounter  Procedures  . US Abdomen Complete    Standing Status: Future     Number of Occurrences: 1     Standing Expiration Date: 10/31/2016    Order Specific Question:  Reason for Exam (SYMPTOM  OR DIAGNOSIS REQUIRED)    Answer:  f/u for abdominal pain and splenomegaly    Order Specific Question:  Preferred imaging location?    Answer:  [facility] [place]  . CBC & Diff and Retic    Standing Status: Future     Number of Occurrences: 1     Standing Expiration Date: 12/05/2016  . Comprehensive metabolic panel    Standing Status: Future     Number of Occurrences: 1      Standing Expiration Date: 10/31/2016  . Lactate dehydrogenase (LDH) - [facility]    Standing Status: Future     Number of Occurrences: 1     Standing Expiration Date: 10/31/2016  . Sedimentation rate    Standing Status: Future     Number of Occurrences: 1     Standing Expiration Date: 10/31/2016  . C-reactive protein    Standing Status: Future     Number of Occurrences: 1     Standing Expiration Date: 10/31/2016  . Hepatitis C antibody    Standing Status: Future     Number of Occurrences: 1     Standing Expiration Date: 10/31/2016    All of the patients questions were answered with apparent satisfaction. The patient knows to call the clinic with any problems, questions or concerns.  I spent 60 minutes counseling the patient face to face. The total time spent in the appointment was 60 minutes and more than 50% was on counseling and direct patient cares.  Sullivan Lone MD [place] AAHIVMS [facility] Hematology/Oncology Physician [facility]  (Office):       [phone] (Work cell):  [phone] (Fax):           [phone]  11/01/2015 10:25 AM

## 2015-11-02 LAB — HEPATITIS C ANTIBODY

## 2015-11-02 LAB — SEDIMENTATION RATE: SED RATE: 11 mm/h (ref 0–30)

## 2015-11-02 LAB — C-REACTIVE PROTEIN: CRP: 31.4 mg/L — ABNORMAL HIGH (ref 0.0–4.9)

## 2015-11-03 ENCOUNTER — Encounter: Payer: Self-pay | Admitting: Internal Medicine

## 2015-11-03 ENCOUNTER — Ambulatory Visit (INDEPENDENT_AMBULATORY_CARE_PROVIDER_SITE_OTHER): Payer: Medicare Other | Admitting: Internal Medicine

## 2015-11-03 VITALS — BP 148/72 | HR 82 | Temp 98.2°F | Resp 20 | Ht 72.0 in | Wt 244.0 lb

## 2015-11-03 DIAGNOSIS — E0840 Diabetes mellitus due to underlying condition with diabetic neuropathy, unspecified: Secondary | ICD-10-CM

## 2015-11-03 DIAGNOSIS — I10 Essential (primary) hypertension: Secondary | ICD-10-CM | POA: Diagnosis not present

## 2015-11-03 DIAGNOSIS — M1009 Idiopathic gout, multiple sites: Secondary | ICD-10-CM | POA: Diagnosis not present

## 2015-11-03 DIAGNOSIS — I480 Paroxysmal atrial fibrillation: Secondary | ICD-10-CM

## 2015-11-03 DIAGNOSIS — M109 Gout, unspecified: Secondary | ICD-10-CM

## 2015-11-03 DIAGNOSIS — Z794 Long term (current) use of insulin: Secondary | ICD-10-CM

## 2015-11-03 MED ORDER — FUROSEMIDE 20 MG PO TABS: 20.0000 mg | ORAL_TABLET | Freq: Every day | ORAL | Status: DC

## 2015-11-03 NOTE — Patient Instructions (Signed)
Limit your sodium (Salt) intake  Discontinue hydrochlorothiazide  Furosemide 20 mg daily  Low purine diet as directedLow-Purine Diet Purines are compounds that affect the level of uric acid in your body. A low-purine diet is a diet that is low in purines. Eating a low-purine diet can prevent the level of uric acid in your body from getting too high and causing gout or kidney stones or both. WHAT DO I NEED TO KNOW ABOUT THIS DIET?  Choose low-purine foods. Examples of low-purine foods are listed in the next section.  Drink plenty of fluids, especially water. Fluids can help remove uric acid from your body. Try to drink 8-16 cups (1.9-3.8 L) a day.  Limit foods high in fat, especially saturated fat, as fat makes it harder for the body to get rid of uric acid. Foods high in saturated fat include pizza, cheese, ice cream, whole milk, fried foods, and gravies. Choose foods that are lower in fat and lean sources of protein. Use olive oil when cooking as it contains healthy fats that are not high in saturated fat.  Limit alcohol. Alcohol interferes with the elimination of uric acid from your body. If you are having a gout attack, avoid all alcohol.  Keep in mind that different people's bodies react differently to different foods. You will probably learn over time which foods do or do not affect you. If you discover that a food tends to cause your gout to flare up, avoid eating that food. You can more freely enjoy foods that do not cause problems. If you have any questions about a food item, talk to your dietitian or health care provider. WHICH FOODS ARE LOW, MODERATE, AND HIGH IN PURINES? The following is a list of foods that are low, moderate, and high in purines. You can eat any amount of the foods that are low in purines. You may be able to have small amounts of foods that are moderate in purines. Ask your health care provider how much of a food moderate in purines you can have. Avoid foods high in  purines. Grains  Foods low in purines: Enriched white bread, pasta, rice, cake, cornbread, popcorn.  Foods moderate in purines: Whole-grain breads and cereals, wheat germ, bran, oatmeal. Uncooked oatmeal. Dry wheat bran or wheat germ.  Foods high in purines: Pancakes, [place] toast, biscuits, muffins. Vegetables  Foods low in purines: All vegetables, except those that are moderate in purines.  Foods moderate in purines: Asparagus, cauliflower, spinach, mushrooms, green peas. Fruits  All fruits are low in purines. Meats and other Protein Foods  Foods low in purines: Eggs, nuts, peanut butter.  Foods moderate in purines: 80-90% lean beef, lamb, veal, pork, poultry, fish, eggs, peanut butter, nuts. Crab, lobster, oysters, and shrimp. Cooked dried beans, peas, and lentils.  Foods high in purines: Anchovies, sardines, herring, mussels, tuna, codfish, scallops, trout, and haddock. Berniece Salines. Organ meats (such as liver or kidney). Tripe. Game meat. Goose. Sweetbreads. Dairy  All dairy foods are low in purines. Low-fat and fat-free dairy products are best because they are low in saturated fat. Beverages  Drinks low in purines: Water, carbonated beverages, tea, coffee, cocoa.  Drinks moderate in purines: Soft drinks and other drinks sweetened with high-fructose corn syrup. Juices. To find whether a food or drink is sweetened with high-fructose corn syrup, look at the ingredients list.  Drinks high in purines: Alcoholic beverages (such as beer). Condiments  Foods low in purines: Salt, herbs, olives, pickles, relishes, vinegar.  Foods moderate  in purines: Butter, margarine, oils, mayonnaise. Fats and Oils  Foods low in purines: All types, except gravies and sauces made with meat.  Foods high in purines: Gravies and sauces made with meat. Other Foods  Foods low in purines: Sugars, sweets, gelatin. Cake. Soups made without meat.  Foods moderate in purines: Meat-based or fish-based soups,  broths, or bouillons. Foods and drinks sweetened with high-fructose corn syrup.  Foods high in purines: High-fat desserts (such as ice cream, cookies, cakes, pies, doughnuts, and chocolate). Contact your dietitian for more information on foods that are not listed here.   This information is not intended to replace advice given to you by your health care provider. Make sure you discuss any questions you have with your health care provider.   Document Released: 01/11/2011 Document Revised: 09/21/2013 Document Reviewed: 08/23/2013 Elsevier Interactive Patient Education [employer].

## 2015-11-03 NOTE — Progress Notes (Signed)
Subjective:    Patient ID: Jimmy Carr, male    DOB: 04-11-1941, 75 y.o.   MRN: [id number]  HPI  75 year old patient who is seen today for follow-up of pain and swelling involving primarily his right ankle.  This started 3 days ago and was quite painful for the first 24 hours.  Over the past 24 hours.  Pain and swelling has improved significantly.  Initially had a difficult time walking due to the pain and was quite uncomfortable through the night. He was seen by hematology for mild splenomegaly who ordered a venous Doppler that was negative for DVT.  He does have a history of hyperuricemia but no diagnosis of definite gout.  He states that he has had intermittent painful joints, especially involving the knee associated with redness, pain and swelling.  He is on thiazide diuretic for blood pressure control  Past Medical History  Diagnosis Date  . Diabetes mellitus type II   . Hyperlipidemia   . Hypertension   . Peripheral neuropathy ([place])   . Cerebrovascular accident [facility]) 2003    history of small vessal lacunar stroke 2003  . Glaucoma   . Other specified cardiac dysrhythmias(427.89)     PSVT  . AF (atrial fibrillation) ([place])     2D ECHO, 03/01/1987 - normal; NUCLEAR STRESS TEST, 04/10/2006 - EF 65%, no ischemia  . Bruit     CAROTID DOPPLER, 09/20/2008 - normal, no evidence of diameter reduction, significant tortuousity or any other vascular abnormality    Social History   Social History  . Marital Status: Married    Spouse Name: N/A  . Number of Children: N/A  . Years of Education: N/A   Occupational History  . Not on file.   Social History Main Topics  . Smoking status: Never Smoker   . Smokeless tobacco: Never Used  . Alcohol Use: No  . Drug Use: No  . Sexual Activity: Not on file   Other Topics Concern  . Not on file   Social History Narrative   Non smoker    On disability    Past Surgical History  Procedure Laterality Date  . Anal fissure repair    .  Cardiac catheterization  02/02/2001    Normal LV systolic function, questionable mitral valve prolapse without mitral regurgitation, no evidence of CAD  . Cardiac catheterization  09/13/2008    Minimal CAD, normal LV systolic function, no evidence of renal artery stenosis, distal aortic disease, or iliac disease    Family History  Problem Relation Age of Onset  . Diabetes Neg Hx     Allergies  Allergen Reactions  . Cheratussin Ac [Guaifenesin-Codeine] Nausea And Vomiting  . Verapamil     Current Outpatient Prescriptions on File Prior to Visit  Medication Sig Dispense Refill  . aspirin (ASPIRIN EC) 81 MG EC tablet Take 81 mg by mouth daily.      . B-D ULTRAFINE III SHORT PEN 31G X 8 MM MISC USE 4 TIMES DAILY BEFORE MEALS AND AT BEDTIME 100 each 12  . benzonatate (TESSALON) 200 MG capsule Take 1 capsule (200 mg total) by mouth 2 (two) times daily as needed for cough. 20 capsule 0  . glimepiride (AMARYL) 4 MG tablet TAKE 1 BY MOUTH DAILY BEFORE BREAKFAST 90 tablet 1  . glucose blood (FREESTYLE TEST STRIPS) test strip 1 each by Other route 3 (three) times daily as needed for other. 100 each 12  . hydrochlorothiazide (HYDRODIURIL) 25 MG tablet TAKE 1 BY  MOUTH DAILY 90 tablet 1  . Insulin Glargine (LANTUS SOLOSTAR) 100 UNIT/ML Solostar Pen INJECT 60 UNITS IN SKIN AT 10 PM 60 pen 3  . insulin lispro (HUMALOG KWIKPEN) 100 UNIT/ML KiwkPen 3 times a day (just before each meal) 50-35-50 units 50 pen 3  . latanoprost (XALATAN) 0.005 % ophthalmic solution Place 1 drop into both eyes daily.     Marland Kitchen lisinopril (PRINIVIL,ZESTRIL) 40 MG tablet TAKE 1 BY MOUTH DAILY 90 tablet 1  . lovastatin (MEVACOR) 40 MG tablet TAKE 1 BY MOUTH AT BEDTIME 90 tablet 1  . metFORMIN (GLUCOPHAGE) 1000 MG tablet Take 1 tablet (1,000 mg total) by mouth 2 (two) times daily with a meal. 180 tablet 1  . timolol (TIMOPTIC-XR) 0.5 % ophthalmic gel-forming Place 1 drop into both eyes daily.      No current facility-administered  medications on file prior to visit.    BP 148/72 mmHg  Pulse 82  Temp(Src) 98.2 F (36.8 C) (Oral)  Resp 20  Ht 6' (1.829 m)  Wt 244 lb (110.678 kg)  BMI 33.09 kg/m2  SpO2 99%     Review of Systems  Constitutional: Negative for fever, chills, appetite change and fatigue.  HENT: Negative for congestion, dental problem, ear pain, hearing loss, sore throat, tinnitus, trouble swallowing and voice change.   Eyes: Negative for pain, discharge and visual disturbance.  Respiratory: Negative for cough, chest tightness, wheezing and stridor.   Cardiovascular: Negative for chest pain, palpitations and leg swelling.  Gastrointestinal: Negative for nausea, vomiting, abdominal pain, diarrhea, constipation, blood in stool and abdominal distention.  Genitourinary: Negative for urgency, hematuria, flank pain, discharge, difficulty urinating and genital sores.  Musculoskeletal: Positive for joint swelling and arthralgias. Negative for myalgias, back pain, gait problem and neck stiffness.  Skin: Negative for rash.  Neurological: Negative for dizziness, syncope, speech difficulty, weakness, numbness and headaches.  Hematological: Negative for adenopathy. Does not bruise/bleed easily.  Psychiatric/Behavioral: Negative for behavioral problems and dysphoric mood. The patient is not nervous/anxious.        Objective:   Physical Exam  Constitutional: He appears well-developed and well-nourished. No distress.  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  Unremarkable except for slight increased total bilirubin and increase  C-reactive protein Increased CRP.  Probably secondary to gouty arthritis   We'll discontinue hydrochlorothiazide and substitute furosemide 20 Low-salt diet recommended Information concerning low purine diet, dispensed Consider switching from lisinopril to losartan for the latter's  uricosuric effect

## 2015-11-03 NOTE — Progress Notes (Signed)
Pre visit review using our clinic review tool, if applicable. No additional management support is needed unless otherwise documented below in the visit note. 

## 2015-11-09 ENCOUNTER — Telehealth: Payer: Self-pay | Admitting: Internal Medicine

## 2015-11-09 NOTE — Telephone Encounter (Signed)
Discontinue furosemide Resume hydrochlorothiazide if  blood pressures consistently greater than 140 over 90

## 2015-11-09 NOTE — Telephone Encounter (Signed)
Spoke to pt, told him to discontinue furosemide and resume hydrochlorothiazide if blood pressures consistently greater than 140 over 90 per Dr.K. Pt verbalized understanding.

## 2015-11-09 NOTE — Telephone Encounter (Signed)
PLEASE NOTE: All timestamps contained within this report are represented as [place] Standard Time. CONFIDENTIALTY NOTICE: This fax transmission is intended only for the addressee. It contains information that is legally privileged, confidential or otherwise protected from use or disclosure. If you are not the intended recipient, you are strictly prohibited from reviewing, disclosing, copying using or disseminating any of this information or taking any action in reliance on or regarding this information. If you have received this fax in error, please notify us immediately by telephone so that we can arrange for its return to [place]. Phone: [phone], Toll-Free: [phone], Fax: [phone] Page: 1 of 1 Call Id: [id number] [facility] - Client [place] Patient Name: Jimmy Carr DOB: August 27, 1941 Initial Comment caller states he is unable to take new medication Nurse Assessment Nurse: Marcelline Deist, RN, Kermit Balo Date/Time (Eastern Time): 11/09/2015 11:56:28 AM Confirm and document reason for call. If symptomatic, describe symptoms. You must click the next button to save text entered. ---Caller states he is unable to take new medication - Furosemide 20 mg. Was feeling very weak & dizzy, urinating too much, getting dehydrated with muscle cramps. Taken off of HCTZ d/t possible gout symptoms. Took the rx for about 3 days. Didn't take it today. Feels better today, drinking water. No symptoms. Has the patient traveled out of the country within the last 30 days? ---Not Applicable Does the patient have any new or worsening symptoms? ---Yes Will a triage be completed? ---No Select reason for no triage. ---Other Please document clinical information provided and list any resource used. ---Caller states he feels better today. He wants to know how to proceed as he doesn't think this new rx Furosemide will work for him. Please contact him at  this # for further advice. He doesn't have an issue taking the HCTZ, although his Dr. felt it might be contributing to gout symptoms. States he takes other BP rxs as well. No symptoms today. Guidelines Guideline Title Affirmed Question Affirmed Notes Final Disposition User Clinical Call [place], RN, Kermit Balo

## 2015-11-09 NOTE — Telephone Encounter (Signed)
Please see message and advise 

## 2015-11-09 NOTE — Telephone Encounter (Signed)
Please advise 

## 2016-01-08 DIAGNOSIS — H2513 Age-related nuclear cataract, bilateral: Secondary | ICD-10-CM | POA: Diagnosis not present

## 2016-01-08 DIAGNOSIS — H401133 Primary open-angle glaucoma, bilateral, severe stage: Secondary | ICD-10-CM | POA: Diagnosis not present

## 2016-01-08 DIAGNOSIS — E113291 Type 2 diabetes mellitus with mild nonproliferative diabetic retinopathy without macular edema, right eye: Secondary | ICD-10-CM | POA: Diagnosis not present

## 2016-01-31 ENCOUNTER — Ambulatory Visit (HOSPITAL_COMMUNITY)
Admission: RE | Admit: 2016-01-31 | Discharge: 2016-01-31 | Disposition: A | Discharge status: Home / Self Care | Payer: Medicare Other | Source: Ambulatory Visit | Attending: Hematology | Admitting: Hematology

## 2016-01-31 ENCOUNTER — Encounter: Payer: Self-pay | Admitting: Hematology

## 2016-01-31 ENCOUNTER — Telehealth: Payer: Self-pay | Admitting: Hematology

## 2016-01-31 ENCOUNTER — Ambulatory Visit (HOSPITAL_BASED_OUTPATIENT_CLINIC_OR_DEPARTMENT_OTHER): Payer: Medicare Other

## 2016-01-31 ENCOUNTER — Ambulatory Visit (HOSPITAL_BASED_OUTPATIENT_CLINIC_OR_DEPARTMENT_OTHER): Payer: Medicare Other | Admitting: Hematology

## 2016-01-31 VITALS — BP 134/71 | HR 71 | Temp 98.1°F | Resp 20 | Ht 72.0 in | Wt 240.3 lb

## 2016-01-31 DIAGNOSIS — R932 Abnormal findings on diagnostic imaging of liver and biliary tract: Secondary | ICD-10-CM | POA: Diagnosis not present

## 2016-01-31 DIAGNOSIS — R918 Other nonspecific abnormal finding of lung field: Secondary | ICD-10-CM | POA: Diagnosis not present

## 2016-01-31 DIAGNOSIS — R161 Splenomegaly, not elsewhere classified: Secondary | ICD-10-CM

## 2016-01-31 DIAGNOSIS — R0781 Pleurodynia: Secondary | ICD-10-CM | POA: Diagnosis not present

## 2016-01-31 DIAGNOSIS — D696 Thrombocytopenia, unspecified: Secondary | ICD-10-CM | POA: Diagnosis not present

## 2016-01-31 DIAGNOSIS — K769 Liver disease, unspecified: Secondary | ICD-10-CM | POA: Insufficient documentation

## 2016-01-31 DIAGNOSIS — R109 Unspecified abdominal pain: Secondary | ICD-10-CM | POA: Diagnosis not present

## 2016-01-31 LAB — CBC & DIFF AND RETIC
BASO%: 0.6 % (ref 0.0–2.0)
Basophils Absolute: 0.1 10*3/uL (ref 0.0–0.1)
EOS ABS: 0.6 10*3/uL — AB (ref 0.0–0.5)
EOS%: 7.2 % — ABNORMAL HIGH (ref 0.0–7.0)
HCT: 45.3 % (ref 38.4–49.9)
HEMOGLOBIN: 15.7 g/dL (ref 13.0–17.1)
IMMATURE RETIC FRACT: 5.7 % (ref 3.00–10.60)
LYMPH%: 29.4 % (ref 14.0–49.0)
MCH: 28 pg (ref 27.2–33.4)
MCHC: 34.7 g/dL (ref 32.0–36.0)
MCV: 80.7 fL (ref 79.3–98.0)
MONO#: 0.8 10*3/uL (ref 0.1–0.9)
MONO%: 9.6 % (ref 0.0–14.0)
NEUT%: 53.2 % (ref 39.0–75.0)
NEUTROS ABS: 4.1 10*3/uL (ref 1.5–6.5)
Platelets: 134 10*3/uL — ABNORMAL LOW (ref 140–400)
RBC: 5.61 10*6/uL (ref 4.20–5.82)
RDW: 14.5 % (ref 11.0–14.6)
RETIC %: 1.83 % — AB (ref 0.80–1.80)
Retic Ct Abs: 102.66 10*3/uL — ABNORMAL HIGH (ref 34.80–93.90)
WBC: 7.8 10*3/uL (ref 4.0–10.3)
lymph#: 2.3 10*3/uL (ref 0.9–3.3)

## 2016-01-31 LAB — COMPREHENSIVE METABOLIC PANEL
ALBUMIN: 4.1 g/dL (ref 3.5–5.0)
ALT: 24 U/L (ref 0–55)
AST: 15 U/L (ref 5–34)
Alkaline Phosphatase: 63 U/L (ref 40–150)
Anion Gap: 10 mEq/L (ref 3–11)
BILIRUBIN TOTAL: 0.9 mg/dL (ref 0.20–1.20)
BUN: 21.8 mg/dL (ref 7.0–26.0)
CO2: 27 meq/L (ref 22–29)
Calcium: 9.8 mg/dL (ref 8.4–10.4)
Chloride: 104 mEq/L (ref 98–109)
Creatinine: 1.4 mg/dL — ABNORMAL HIGH (ref 0.7–1.3)
EGFR: 52 mL/min/{1.73_m2} — AB (ref >90)
GLUCOSE: 120 mg/dL (ref 70–140)
POTASSIUM: 4.4 meq/L (ref 3.5–5.1)
SODIUM: 140 meq/L (ref 136–145)
TOTAL PROTEIN: 7.2 g/dL (ref 6.4–8.3)

## 2016-01-31 NOTE — Telephone Encounter (Signed)
per po fto sch pt appt-gave pt copy of avs-sent back to lab °

## 2016-01-31 NOTE — Progress Notes (Signed)
Jimmy Carr    HEMATOLOGY/ONCOLOGY CONSULTATION NOTE  Date of Service: 01/31/2016  Patient Care Team: Marletta Lor, MD as PCP - General  CHIEF COMPLAINTS: Follow-up for splenomegaly, thrombocytopenia  INTERVAL HISTORY  Mr. Jimmy Carr is here for his scheduled 3 month follow-up. Notes some sinus drainage but no other new symptoms since his last visit. No fevers no chills no night sweats no new bone pains. Right lower extremity swelling better. Denies alcohol use. Notes that he fell in the parking lot few days back and has had some right lower chest wall pain from the fall. Has not noted any new abdominal pain or discomfort. No change in bowel habits. Had a repeat ultrasound of the abdomen that shows that his spleen size is smaller. Continues to have hyperechoic liver parenchyma with coarse hepatic echotexture. No focal hepatic lesion seen.   MEDICAL HISTORY:  Past Medical History  Diagnosis Date  . Diabetes mellitus type II   . Hyperlipidemia   . Hypertension   . Peripheral neuropathy ([place])   . Cerebrovascular accident [facility]) 2003    history of small vessal lacunar stroke 2003  . Glaucoma   . Other specified cardiac dysrhythmias(427.89)     PSVT  . AF (atrial fibrillation) ([place])     2D ECHO, 03/01/1987 - normal; NUCLEAR STRESS TEST, 04/10/2006 - EF 65%, no ischemia  . Bruit     CAROTID DOPPLER, 09/20/2008 - normal, no evidence of diameter reduction, significant tortuousity or any other vascular abnormality    SURGICAL HISTORY: Past Surgical History  Procedure Laterality Date  . Anal fissure repair    . Cardiac catheterization  02/02/2001    Normal LV systolic function, questionable mitral valve prolapse without mitral regurgitation, no evidence of CAD  . Cardiac catheterization  09/13/2008    Minimal CAD, normal LV systolic function, no evidence of renal artery stenosis, distal aortic disease, or iliac disease    SOCIAL HISTORY: Social History   Social History  . Marital Status:  Married    Spouse Name: N/A  . Number of Children: N/A  . Years of Education: N/A   Occupational History  . Not on file.   Social History Main Topics  . Smoking status: Never Smoker   . Smokeless tobacco: Never Used  . Alcohol Use: No  . Drug Use: No  . Sexual Activity: Not on file   Other Topics Concern  . Not on file   Social History Narrative   Non smoker    On disability    FAMILY HISTORY: Family History  Problem Relation Age of Onset  . Diabetes Neg Hx     ALLERGIES:  is allergic to cheratussin ac and verapamil.  MEDICATIONS:  Current Outpatient Prescriptions  Medication Sig Dispense Refill  . aspirin (ASPIRIN EC) 81 MG EC tablet Take 81 mg by mouth daily.      . B-D ULTRAFINE III SHORT PEN 31G X 8 MM MISC USE 4 TIMES DAILY BEFORE MEALS AND AT BEDTIME 100 each 12  . glimepiride (AMARYL) 4 MG tablet TAKE 1 BY MOUTH DAILY BEFORE BREAKFAST 90 tablet 1  . glucose blood (FREESTYLE TEST STRIPS) test strip 1 each by Other route 3 (three) times daily as needed for other. 100 each 12  . hydrochlorothiazide (HYDRODIURIL) 25 MG tablet Take 25 mg by mouth daily.  0  . Insulin Glargine (LANTUS SOLOSTAR) 100 UNIT/ML Solostar Pen INJECT 60 UNITS IN SKIN AT 10 PM 60 pen 3  . insulin lispro (HUMALOG KWIKPEN) 100 UNIT/ML  KiwkPen 3 times a day (just before each meal) 50-35-50 units 50 pen 3  . latanoprost (XALATAN) 0.005 % ophthalmic solution Place 1 drop into both eyes daily.     Jimmy Carr lisinopril (PRINIVIL,ZESTRIL) 40 MG tablet TAKE 1 BY MOUTH DAILY 90 tablet 1  . lovastatin (MEVACOR) 40 MG tablet TAKE 1 BY MOUTH AT BEDTIME 90 tablet 1  . metFORMIN (GLUCOPHAGE) 1000 MG tablet Take 1 tablet (1,000 mg total) by mouth 2 (two) times daily with a meal. 180 tablet 1  . timolol (TIMOPTIC-XR) 0.5 % ophthalmic gel-forming Place 1 drop into both eyes daily.      No current facility-administered medications for this visit.    REVIEW OF SYSTEMS:    10 Point review of Systems was done is  negative except as noted above.  PHYSICAL EXAMINATION: ECOG PERFORMANCE STATUS: 1 - Symptomatic but completely ambulatory  . Filed Vitals:   01/31/16 0946  BP: 134/71  Pulse: 71  Temp: 98.1 F (36.7 C)  Resp: 20   Filed Weights   01/31/16 0946  Weight: 240 lb 4.8 oz (108.999 kg)   .Body mass index is 32.58 kg/(m^2).  GENERAL:alert, in no acute distress and comfortable SKIN: skin color, texture, turgor are normal, no rashes or significant lesions EYES: normal, conjunctiva are pink and non-injected, sclera clear OROPHARYNX:no exudate, no erythema and lips, buccal mucosa, and tongue normal  NECK: supple, no JVD, thyroid normal size, non-tender, without nodularity LYMPH:  no palpable lymphadenopathy in the cervical, axillary or inguinal LUNGS: clear to auscultation with normal respiratory effort HEART: regular rate & rhythm,  no murmurs Minimal right lower extremity swelling ABDOMEN: abdomen soft, non-tender, normoactive bowel sounds , No palpable hepatosplenomegaly Musculoskeletal: no cyanosis of digits and no clubbing  PSYCH: alert & oriented x 3 with fluent speech NEURO: no focal motor/sensory deficits  LABORATORY DATA:  I have reviewed the data as listed  . CBC Latest Ref Rng 01/31/2016 11/01/2015 05/16/2015  WBC 4.0 - 10.3 10e3/uL 7.8 10.8(H) 8.9  Hemoglobin 13.0 - 17.1 g/dL 15.7 15.5 16.5  Hematocrit 38.4 - 49.9 % 45.3 45.6 47.8  Platelets 140 - 400 10e3/uL 134(L) 138(L) 153.0   . CBC    Component Value Date/Time   WBC 7.8 01/31/2016 1143   WBC 8.9 05/16/2015 1434   RBC 5.61 01/31/2016 1143   RBC 5.91* 05/16/2015 1434   HGB 15.7 01/31/2016 1143   HGB 16.5 05/16/2015 1434   HCT 45.3 01/31/2016 1143   HCT 47.8 05/16/2015 1434   PLT 134* 01/31/2016 1143   PLT 153.0 05/16/2015 1434   MCV 80.7 01/31/2016 1143   MCV 80.9 05/16/2015 1434   MCH 28.0 01/31/2016 1143   MCHC 34.7 01/31/2016 1143   MCHC 34.4 05/16/2015 1434   RDW 14.5 01/31/2016 1143   RDW 14.7  05/16/2015 1434   LYMPHSABS 2.3 01/31/2016 1143   LYMPHSABS 2.4 05/16/2015 1434   MONOABS 0.8 01/31/2016 1143   MONOABS 0.7 05/16/2015 1434   EOSABS 0.6* 01/31/2016 1143   EOSABS 0.5 05/16/2015 1434   BASOSABS 0.1 01/31/2016 1143   BASOSABS 0.0 05/16/2015 1434      . CMP Latest Ref Rng 01/31/2016 01/31/2016 11/01/2015  Glucose 70 - 140 mg/dl 120 - 170(H)  BUN 7.0 - 26.0 mg/dL 21.8 - 20.2  Creatinine 0.7 - 1.3 mg/dL 1.4(H) - 1.4(H)  Sodium 136 - 145 mEq/L 140 - 137  Potassium 3.5 - 5.1 mEq/L 4.4 - 4.3  Chloride 96 - 112 mEq/L - - -  CO2  22 - 29 mEq/L 27 - 25  Calcium 8.4 - 10.4 mg/dL 9.8 - 9.7  Total Protein 6.0 - 8.5 g/dL 7.2 6.9 7.8  Total Bilirubin 0.20 - 1.20 mg/dL 0.90 - 1.50(H)  Alkaline Phos 40 - 150 U/L 63 - 66  AST 5 - 34 U/L 15 - 15  ALT 0 - 55 U/L 24 - 22       RADIOGRAPHIC STUDIES: I have personally reviewed the radiological images as listed and agreed with the findings in the report. US Abdomen Complete  01/31/2016  CLINICAL DATA:  Right flank pain x3 months, splenomegaly EXAM: ABDOMEN ULTRASOUND COMPLETE COMPARISON:  10/29/2005 T FINDINGS: Gallbladder: No gallstones, gallbladder wall thickening, or pericholecystic fluid. Negative sonographic Murphy's sign. Common bile duct: Diameter: 4 mm Liver: Hyperechoic hepatic parenchyma with coarse hepatic echotexture. No focal hepatic lesion is seen. IVC: No abnormality visualized. Pancreas: Visualized portion unremarkable. Spleen: Splenomegaly, measuring 13.3 x 15.5 x 6.8 cm (calculated volume 734 mL), previously 14.0 x 14.3 x 8.3 cm with calculated volume 864 mL. Right Kidney: Length: 11.8 cm.  No mass or hydronephrosis. Left Kidney: Length: 11.9 cm.  No mass or hydronephrosis. Abdominal aorta: No aneurysm visualized. Other findings: None. IMPRESSION: Hyperechoic hepatic parenchyma coarse hepatic echotexture, nonspecific but suggesting hepatocellular disease, most commonly hepatic steatosis or possibly mild cirrhosis. No focal  hepatic lesion is seen. Mild splenomegaly, calculated volume 734 mL, stable versus mildly decreased. Electronically Signed   By: Julian Hy M.D.   On: 01/31/2016 09:44    ASSESSMENT & PLAN:   75 year old male with  #1 Mild Splenomegaly likely related to underlying liver disease. Repeat ultrasound shows that the liver parenchyma still appears hyperechoic with possibility of mildly cirrhosis. Spleen size about the same or slightly smaller. With previously elevated CRP and leukocytosis and as well as monocytosis patient might have had a viral infection previous to the last visit which appears to have resolved. Patient CRP levels have normalized and his leukocytosis and monocytosis have resolved in the interval.  No palpable lymphadenopathy. No lymphocytosis. LDH within normal limits. All of which makes a lymphoproliferative disorder less likely. His blood counts are stable with no polycythemia or thrombocytosis. Mild leucocytosis without focal symptoms. Overall not suggestive of myeloproliferative neoplasm at this time.  #2 mild thrombocytopenia likely related to his mild splenomegaly/liver disease. Hepatitis C negative serology.  #3 elevated CRP - normal on repeat labs today. Myeloma labs negative for M protein.  PLAN -No evidence of a myeloproliferative or lymphoproliferative disorder at this time . -Continue to follow with primary care physician.  -Will need to work on diet and exercise to achieve vital body weight and optimize his diabetes management and dyslipidemia management in the setting of likely [place] with possible early cirrhosis.  Return to care with Dr. Irene Limbo on an as-needed basis if any other acute new questions or concerns arise.  All of the patients questions were answered with apparent satisfaction. The patient knows to call the clinic with any problems, questions or concerns.  I spent 25 minutes counseling the patient face to face. The total time spent in the  appointment was 25 minutes and more than 50% was on counseling and direct patient cares.    Sullivan Lone MD [place] AAHIVMS [facility] Hematology/Oncology Physician [facility]  (Office):       [phone] (Work cell):  [phone] (Fax):           [phone]  01/31/2016 10:38 AM

## 2016-02-01 LAB — MULTIPLE MYELOMA PANEL, SERUM
ALBUMIN SERPL ELPH-MCNC: 4 g/dL (ref 2.9–4.4)
ALBUMIN/GLOB SERPL: 1.4 (ref 0.7–1.7)
ALPHA 1: 0.2 g/dL (ref 0.0–0.4)
Alpha2 Glob SerPl Elph-Mcnc: 0.7 g/dL (ref 0.4–1.0)
B-GLOBULIN SERPL ELPH-MCNC: 1.3 g/dL (ref 0.7–1.3)
GAMMA GLOB SERPL ELPH-MCNC: 0.8 g/dL (ref 0.4–1.8)
GLOBULIN, TOTAL: 2.9 g/dL (ref 2.2–3.9)
IGA/IMMUNOGLOBULIN A, SERUM: 436 mg/dL (ref 61–437)
IgG, Qn, Serum: 790 mg/dL (ref 700–1600)
IgM, Qn, Serum: 80 mg/dL (ref 15–143)
Total Protein: 6.9 g/dL (ref 6.0–8.5)

## 2016-02-01 LAB — C-REACTIVE PROTEIN: CRP: 1 mg/L (ref 0.0–4.9)

## 2016-03-11 ENCOUNTER — Telehealth: Payer: Self-pay | Admitting: Internal Medicine

## 2016-03-11 NOTE — Telephone Encounter (Signed)
Pt call to say he has received 3 calls from the automatic caller to schedule a colonscopy. Does he need a referral he said it has been 10 years since his last one

## 2016-03-12 NOTE — Telephone Encounter (Signed)
Spoke to pt, asked him if he has had a Colonoscopy before? Pt said yes, it has been ten years. Looked in pt's chart and saw he had one done 2007 with [facility] Dr. Fuller Plan. Told pt being as he is established with them does not need referral can call and schedule colonoscopy, phone number given to pt. Pt verbalized understanding.

## 2016-04-18 ENCOUNTER — Other Ambulatory Visit: Payer: Self-pay | Admitting: Internal Medicine

## 2016-04-19 ENCOUNTER — Other Ambulatory Visit: Payer: Self-pay | Admitting: Family Medicine

## 2016-04-19 MED ORDER — LOVASTATIN 40 MG PO TABS: ORAL_TABLET | ORAL | Status: DC

## 2016-04-19 MED ORDER — LISINOPRIL 40 MG PO TABS
ORAL_TABLET | ORAL | Status: DC
Start: 2016-04-19 — End: 2016-10-04

## 2016-06-21 DIAGNOSIS — H401133 Primary open-angle glaucoma, bilateral, severe stage: Secondary | ICD-10-CM | POA: Diagnosis not present

## 2016-07-10 DIAGNOSIS — H401133 Primary open-angle glaucoma, bilateral, severe stage: Secondary | ICD-10-CM | POA: Diagnosis not present

## 2016-07-10 DIAGNOSIS — E113293 Type 2 diabetes mellitus with mild nonproliferative diabetic retinopathy without macular edema, bilateral: Secondary | ICD-10-CM | POA: Diagnosis not present

## 2016-07-11 ENCOUNTER — Other Ambulatory Visit: Payer: Self-pay | Admitting: Internal Medicine

## 2016-08-02 ENCOUNTER — Encounter: Payer: Self-pay | Admitting: Gastroenterology

## 2016-08-13 ENCOUNTER — Encounter: Payer: Self-pay | Admitting: Gastroenterology

## 2016-08-26 ENCOUNTER — Other Ambulatory Visit: Payer: Self-pay | Admitting: Internal Medicine

## 2016-09-04 ENCOUNTER — Encounter: Payer: Self-pay | Admitting: Family Medicine

## 2016-09-04 ENCOUNTER — Ambulatory Visit (INDEPENDENT_AMBULATORY_CARE_PROVIDER_SITE_OTHER): Payer: Medicare Other | Admitting: Family Medicine

## 2016-09-04 ENCOUNTER — Telehealth: Payer: Self-pay | Admitting: Internal Medicine

## 2016-09-04 VITALS — BP 120/80 | HR 95 | Resp 12 | Ht 72.0 in | Wt 242.4 lb

## 2016-09-04 DIAGNOSIS — R51 Headache: Secondary | ICD-10-CM | POA: Diagnosis not present

## 2016-09-04 DIAGNOSIS — M26621 Arthralgia of right temporomandibular joint: Secondary | ICD-10-CM

## 2016-09-04 DIAGNOSIS — R519 Headache, unspecified: Secondary | ICD-10-CM

## 2016-09-04 LAB — SEDIMENTATION RATE: Sed Rate: 7 mm/hr (ref 0–20)

## 2016-09-04 NOTE — Telephone Encounter (Signed)
Spoke to patient verbalized understanding that he needed to be seen today and that he was scheduled for an appointment at 11.30am  to see Dr [place].

## 2016-09-04 NOTE — Telephone Encounter (Signed)
[facility] - Client [facility]  Patient Name: Jimmy Carr  DOB: Sep 26, 1941    Initial Comment Caller states, he has a pain and shooting over right eye and goes to his temple and then down to his jaw. The pain then goes down to his neck. He had a tooth pulled. Started before the tooth pulling. Verified    Nurse Assessment  Nurse: Wayne Sever, RN, Tillie Rung Date/Time (Eastern Time): 09/04/2016 10:30:47 AM  Confirm and document reason for call. If symptomatic, describe symptoms. ---Caller states he has right sided pain that is over his eye and shoots down into his jaw and neck. He states he had a bad tooth that was pulled and he thought it would go away after the tooth was pulled and it did not. The tooth was pulled 3 days ago. He states he thinks it's a left over infection from the tooth. He states it starts in the jaw and radiates up and down. Denies any fever  Does the patient have any new or worsening symptoms? ---Yes  Will a triage be completed? ---Yes  Related visit to physician within the last 2 weeks? ---No  Does the PT have any chronic conditions? (i.e. diabetes, asthma, etc.) ---Yes  List chronic conditions. ---Diabetes, HTN, High Cholesterol  Is this a behavioral health or substance abuse call? ---No     Guidelines    Guideline Title Affirmed Question Affirmed Notes  Face Pain Face pain present > 24 hours    Final Disposition User   See Physician within 24 Hours [place], RN, Tillie Rung    Comments  Scheduled with Almira Coaster, NP tomorrow, 12/07 at 1000am   Referrals  REFERRED TO PCP OFFICE   Disagree/Comply: Comply

## 2016-09-04 NOTE — Progress Notes (Signed)
Pre visit review using our clinic review tool, if applicable. No additional management support is needed unless otherwise documented below in the visit note. 

## 2016-09-04 NOTE — Patient Instructions (Signed)
  [name] I have seen you today for an acute visit.  1. Right sided temporal headache  - Sed Rate (ESR)  2. Arthralgia of right temporomandibular joint   We discussed possible causes. Monitor for rash, sudden worsening pain, visual changes, swelling, hearing loss among some.  Gabapentin,Lyrica, and Cymbalta to consider in the future for neuropathic pain.  Today findings do not suggest serious illness and as far as it is stable you can follow in 4 weeks.     In general please monitor for signs of worsening symptoms and seek immediate medical attention if any concerning/warning symptom as we discussed.     Please be sure you have an appointment already scheduled with your PCP before you leave today.

## 2016-09-04 NOTE — Telephone Encounter (Signed)
Noted  

## 2016-09-04 NOTE — Progress Notes (Signed)
HPI:  ACUTE VISIT:  Chief Complaint  Patient presents with  . Jaw Pain    [name] is a 75 y.o. male, who is here today complaining of pain on right TMJ area (right preauricular area) that started about 11-12 days ago, states that pain "moves"; it seems to be radiated to right temporal and frontal area, retroauricular, and right paracervical muscles. He denies any injury/fall. He has a hard time describing discomfort, states that "is not really painful", "hurts a little", 3-4/10, occasionally like tingling/numbness, similar to sensation he has with his peripheral neuropathy.  On 08/29/2016 he had dental procedure, right upper wisdom tooth pull; he denies any complications during procedure and states that he was already having pain 4-5 days before procedure and thought it was going to resolved after bad if needed. According to patient, his dentist didn't find any sign of inflammation or abscess, f/u was not recommended.  He has not identified exacerbating factors and it seems to be alleviated by aspirin. Pain is not aggravated by chewing or TMJ movement, he denies any limitation of mouth movement, ear ache, hearing loss, ear drainage, odynophagia, dysphagia, or oral lesions. He denies associated fever, chills, visual changes, facial pain, facial edema, or focal deficit.  In general pain/discomfort seems to be stable.   He denies any prior history of similar symptoms. He has history of peripheral neuropathy.   Hx of DM II, BS's "pritty good"   Review of Systems  Constitutional: Negative for appetite change, fatigue, fever and unexpected weight change.  HENT: Negative for congestion, dental problem, ear discharge, ear pain, facial swelling, hearing loss, mouth sores, nosebleeds, sinus pain, sore throat, trouble swallowing and voice change.   Eyes: Negative for photophobia, pain, redness and visual disturbance.  Respiratory: Negative for cough, shortness of breath and  wheezing.   Cardiovascular: Negative for chest pain.  Gastrointestinal: Negative for abdominal pain, nausea and vomiting.  Musculoskeletal: Positive for neck pain. Negative for gait problem.  Skin: Negative for color change and rash.  Neurological: Positive for numbness and headaches. Negative for dizziness, seizures, syncope, facial asymmetry, speech difficulty and weakness.  Hematological: Negative for adenopathy. Does not bruise/bleed easily.  Psychiatric/Behavioral: Negative for confusion.      Current Outpatient Prescriptions on File Prior to Visit  Medication Sig Dispense Refill  . aspirin (ASPIRIN EC) 81 MG EC tablet Take 81 mg by mouth daily.      . B-D ULTRAFINE III SHORT PEN 31G X 8 MM MISC USE 4 TIMES DAILY BEFORE MEALS AND AT BEDTIME 100 each 12  . glimepiride (AMARYL) 4 MG tablet TAKE 1 BY MOUTH DAILY BEFORE BREAKFAST 90 tablet 1  . glucose blood (FREESTYLE TEST STRIPS) test strip 1 each by Other route 3 (three) times daily as needed for other. 100 each 12  . HUMALOG KWIKPEN 100 UNIT/ML KiwkPen inject subcutaneously three times a day (50 UNS AT BREAKFAST, 35 UNS AT LUNCH 50 UNS AT SUPPER) 45 pen 3  . hydrochlorothiazide (HYDRODIURIL) 25 MG tablet Take 25 mg by mouth daily.  0  . hydrochlorothiazide (HYDRODIURIL) 25 MG tablet take 1 tablet by mouth once daily 90 tablet 1  . Insulin Glargine (LANTUS SOLOSTAR) 100 UNIT/ML Solostar Pen INJECT 60 UNITS IN SKIN AT 10 PM 60 pen 3  . latanoprost (XALATAN) 0.005 % ophthalmic solution Place 1 drop into both eyes daily.     Marland Kitchen lisinopril (PRINIVIL,ZESTRIL) 40 MG tablet TAKE 1 BY MOUTH DAILY 90 tablet 0  .  lovastatin (MEVACOR) 40 MG tablet TAKE 1 BY MOUTH AT BEDTIME 90 tablet 1  . metFORMIN (GLUCOPHAGE) 1000 MG tablet Take 1 tablet (1,000 mg total) by mouth 2 (two) times daily with a meal. 180 tablet 1  . timolol (TIMOPTIC-XR) 0.5 % ophthalmic gel-forming Place 1 drop into both eyes daily.      No current facility-administered medications  on file prior to visit.      Past Medical History:  Diagnosis Date  . AF (atrial fibrillation) ([facility])    2D ECHO, 03/01/1987 - normal; NUCLEAR STRESS TEST, 04/10/2006 - EF 65%, no ischemia  . Bruit    CAROTID DOPPLER, 09/20/2008 - normal, no evidence of diameter reduction, significant tortuousity or any other vascular abnormality  . Cerebrovascular accident [facility]) 2003   history of small vessal lacunar stroke 2003  . Diabetes mellitus type II   . Glaucoma   . Hyperlipidemia   . Hypertension   . 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 Peripheral neuropathy ([facility])    Allergies  Allergen Reactions  . Cheratussin Ac [Guaifenesin-Codeine] Nausea And Vomiting  . Verapamil     Social History   Social History  . Marital status: Married    Spouse name: N/A  . Number of children: N/A  . Years of education: N/A   Social History Main Topics  . Smoking status: Never Smoker  . Smokeless tobacco: Never Used  . Alcohol use No  . Drug use: No  . Sexual activity: Not Asked   Other Topics Concern  . None   Social History Narrative   Non smoker    On disability    Vitals:   09/04/16 1134  BP: 120/80  Pulse: 95  Resp: 12   O2 sat 97% at RA.  Body mass index is 32.87 kg/m.    Physical Exam  Nursing note and vitals reviewed. Constitutional: He is oriented to person, place, and time. He appears well-developed. No distress.  HENT:  Head: Atraumatic.  Right Ear: Tympanic membrane, external ear and ear canal normal. No tenderness. No mastoid tenderness. Tympanic membrane mobility is normal.  Mouth/Throat: Uvula is midline, oropharynx is clear and moist and mucous membranes are normal. No dental abscesses.  No tenderness upon palpation of right temple.Temporal artery no visible. Parotid gland no tender or enlarged.  Missing teeth, no gum edema,erythema,or tenderness with palpation.   Eyes: Conjunctivae and EOM are normal. Pupils are equal, round, and reactive  to light.  Neck: Neck supple. No spinous process tenderness and no muscular tenderness present. No tracheal deviation present.  Cardiovascular: Normal rate and regular rhythm.   No murmur heard. Pulses:      Dorsalis pedis pulses are 2+ on the right side, and 2+ on the left side.  Respiratory: Effort normal and breath sounds normal. No respiratory distress.  Musculoskeletal:  TMJ mild crepitus bilateral, normal ROM, no pain elicited with movement or palpation.  Lymphadenopathy:       Head (right side): No submental, no submandibular, no preauricular and no posterior auricular adenopathy present.       Head (left side): No submental, no submandibular, no preauricular and no posterior auricular adenopathy present.    He has no cervical adenopathy.  Neurological: He is alert and oriented to person, place, and time. He has normal strength. No cranial nerve deficit or sensory deficit. Coordination and gait normal.  Skin: Skin is warm. No rash noted. No erythema.  He has scattered skin lesions: SK and a  few AC on scalp and face, no suspicious lesions.  Psychiatric: He has a normal mood and affect. Cognition and memory are normal.  Well groomed, good eye contact.      ASSESSMENT AND PLAN:     Giancarlo was seen today for jaw pain.  Diagnoses and all orders for this visit:  Right sided temporal headache -     Sed Rate (ESR)  Arthralgia of right temporomandibular joint   Neurologic and musculoskeletal examination today otherwise negative, explained to Mr. Calixto that I cannot pinpoint specifically what could be causing discomfort, in general he seems to be stable and history and examination do not suggest a concerning process at this time. Possible causes we discussed today and no fully supported: trigeminal neuropathy, TMJ synd, radicular pain, beginning of other process as facial palsy or zoster; so instructed to monitor for facial weakness,rash,burning.  We discussed treatment  options, Gabapentin starting at the time is a good option; He is not interested in trying, states that he has tried before for peripheral neuropathy and didn't help. Pain seems to be mild, so for now he agrees with monitoring. He was clearly instructed about warning signs. Further recommendations would be given according to lab results.   Return in about 4 weeks (around 10/02/2016) for PCP please.  Total visit time 26 min, > 50% was dedicated to discussion of age differential diagnosis and  presentation, treatment options, and specific warning signs.   -Mr.Tressia Danas advised to return or notify a doctor immediately if symptoms worsen or new concerns arise, he voices understanding and agrees with plan.       Adaijah Endres G. [place], MD  [facility]. [place] office.

## 2016-09-05 ENCOUNTER — Ambulatory Visit: Payer: Self-pay | Admitting: Family Medicine

## 2016-10-04 ENCOUNTER — Ambulatory Visit (AMBULATORY_SURGERY_CENTER): Payer: Self-pay | Admitting: *Deleted

## 2016-10-04 VITALS — Ht 72.0 in | Wt 243.2 lb

## 2016-10-04 DIAGNOSIS — Z1211 Encounter for screening for malignant neoplasm of colon: Secondary | ICD-10-CM

## 2016-10-04 MED ORDER — NA SULFATE-K SULFATE-MG SULF 17.5-3.13-1.6 GM/177ML PO SOLN
1.0000 | Freq: Once | ORAL | 0 refills | Status: AC
Start: 2016-10-04 — End: 2016-10-04

## 2016-10-04 NOTE — Progress Notes (Signed)
Denies allergies to eggs or soy products. Denies complications with sedation or anesthesia. Denies O2 use. Denies use of diet or weight loss medications.  Emmi instructions given for colonoscopy.  

## 2016-10-09 ENCOUNTER — Other Ambulatory Visit: Payer: Self-pay | Admitting: Internal Medicine

## 2016-10-16 ENCOUNTER — Other Ambulatory Visit: Payer: Self-pay | Admitting: Internal Medicine

## 2016-10-17 ENCOUNTER — Encounter: Payer: Medicare Other | Admitting: Gastroenterology

## 2016-10-23 ENCOUNTER — Other Ambulatory Visit: Payer: Self-pay | Admitting: Internal Medicine

## 2016-11-08 ENCOUNTER — Other Ambulatory Visit: Payer: Self-pay | Admitting: Internal Medicine

## 2016-12-04 ENCOUNTER — Encounter: Payer: Self-pay | Admitting: Gastroenterology

## 2016-12-04 ENCOUNTER — Ambulatory Visit (AMBULATORY_SURGERY_CENTER): Payer: Medicare Other | Admitting: Gastroenterology

## 2016-12-04 VITALS — BP 96/44 | HR 64 | Temp 96.0°F | Resp 34 | Ht 72.0 in | Wt 243.0 lb

## 2016-12-04 DIAGNOSIS — E119 Type 2 diabetes mellitus without complications: Secondary | ICD-10-CM | POA: Diagnosis not present

## 2016-12-04 DIAGNOSIS — D126 Benign neoplasm of colon, unspecified: Secondary | ICD-10-CM

## 2016-12-04 DIAGNOSIS — I2510 Atherosclerotic heart disease of native coronary artery without angina pectoris: Secondary | ICD-10-CM | POA: Diagnosis not present

## 2016-12-04 DIAGNOSIS — Z1211 Encounter for screening for malignant neoplasm of colon: Secondary | ICD-10-CM | POA: Diagnosis not present

## 2016-12-04 DIAGNOSIS — D120 Benign neoplasm of cecum: Secondary | ICD-10-CM | POA: Diagnosis not present

## 2016-12-04 DIAGNOSIS — Z1212 Encounter for screening for malignant neoplasm of rectum: Secondary | ICD-10-CM | POA: Diagnosis not present

## 2016-12-04 DIAGNOSIS — K635 Polyp of colon: Secondary | ICD-10-CM

## 2016-12-04 DIAGNOSIS — I10 Essential (primary) hypertension: Secondary | ICD-10-CM | POA: Diagnosis not present

## 2016-12-04 DIAGNOSIS — D123 Benign neoplasm of transverse colon: Secondary | ICD-10-CM

## 2016-12-04 MED ORDER — SODIUM CHLORIDE 0.9 % IV SOLN: 500.0000 mL | INTRAVENOUS | Status: AC

## 2016-12-04 NOTE — Patient Instructions (Signed)
Discharge instructions given. Handouts on polyps,diverticulosis and hemorrhoids. Resume previous medications. YOU HAD AN ENDOSCOPIC PROCEDURE TODAY AT THE [facility]:   Refer to the procedure report that was given to you for any specific questions about what was found during the examination.  If the procedure report does not answer your questions, please call your gastroenterologist to clarify.  If you requested that your care partner not be given the details of your procedure findings, then the procedure report has been included in a sealed envelope for you to review at your convenience later.  YOU SHOULD EXPECT: Some feelings of bloating in the abdomen. Passage of more gas than usual.  Walking can help get rid of the air that was put into your GI tract during the procedure and reduce the bloating. If you had a lower endoscopy (such as a colonoscopy or flexible sigmoidoscopy) you may notice spotting of blood in your stool or on the toilet paper. If you underwent a bowel prep for your procedure, you may not have a normal bowel movement for a few days.  Please Note:  You might notice some irritation and congestion in your nose or some drainage.  This is from the oxygen used during your procedure.  There is no need for concern and it should clear up in a day or so.  SYMPTOMS TO REPORT IMMEDIATELY:   Following lower endoscopy (colonoscopy or flexible sigmoidoscopy):  Excessive amounts of blood in the stool  Significant tenderness or worsening of abdominal pains  Swelling of the abdomen that is new, acute  Fever of 100F or higher   For urgent or emergent issues, a gastroenterologist can be reached at any hour by calling [phone].   DIET:  We do recommend a small meal at first, but then you may proceed to your regular diet.  Drink plenty of fluids but you should avoid alcoholic beverages for 24 hours.  ACTIVITY:  You should plan to take it easy for the rest of today and you  should NOT DRIVE or use heavy machinery until tomorrow (because of the sedation medicines used during the test).    FOLLOW UP: Our staff will call the number listed on your records the next business day following your procedure to check on you and address any questions or concerns that you may have regarding the information given to you following your procedure. If we do not reach you, we will leave a message.  However, if you are feeling well and you are not experiencing any problems, there is no need to return our call.  We will assume that you have returned to your regular daily activities without incident.  If any biopsies were taken you will be contacted by phone or by letter within the next 1-3 weeks.  Please call us at [phone] if you have not heard about the biopsies in 3 weeks.    SIGNATURES/CONFIDENTIALITY: You and/or your care partner have signed paperwork which will be entered into your electronic medical record.  These signatures attest to the fact that that the information above on your After Visit Summary has been reviewed and is understood.  Full responsibility of the confidentiality of this discharge information lies with you and/or your care-partner. 

## 2016-12-04 NOTE — Progress Notes (Signed)
To recovery, report to McCoy, RN, VSS 

## 2016-12-04 NOTE — Progress Notes (Signed)
Called to room to assist during endoscopic procedure.  Patient ID and intended procedure confirmed with present staff. Received instructions for my participation in the procedure from the performing physician.  

## 2016-12-04 NOTE — Op Note (Signed)
[facility] Patient Name: Jimmy Carr Procedure Date: 12/04/2016 2:16 PM MRN: [id number] Endoscopist: Ladene Artist , MD Age: 76 Referring MD:  Date of Birth: 26-Oct-1940 Gender: Male Account #: [id number] Procedure:                Colonoscopy Indications:              Screening for colorectal malignant neoplasm Medicines:                Monitored Anesthesia Care Procedure:                Pre-Anesthesia Assessment:                           - Prior to the procedure, a History and Physical                            was performed, and patient medications and                            allergies were reviewed. The patient's tolerance of                            previous anesthesia was also reviewed. The risks                            and benefits of the procedure and the sedation                            options and risks were discussed with the patient.                            All questions were answered, and informed consent                            was obtained. Prior Anticoagulants: The patient has                            taken no previous anticoagulant or antiplatelet                            agents. ASA Grade Assessment: II - A patient with                            mild systemic disease. After reviewing the risks                            and benefits, the patient was deemed in                            satisfactory condition to undergo the procedure.                           After obtaining informed consent, the colonoscope  was passed under direct vision. Throughout the                            procedure, the patient's blood pressure, pulse, and                            oxygen saturations were monitored continuously. The                            Model PCF-H190DL [id number]) scope was introduced                            through the anus and advanced to the the cecum,                            identified by  appendiceal orifice and ileocecal                            valve. The ileocecal valve, appendiceal orifice,                            and rectum were photographed. The quality of the                            bowel preparation was good. The colonoscopy was                            performed without difficulty. The patient tolerated                            the procedure well. Scope In: 2:27:57 PM Scope Out: 2:41:41 PM Scope Withdrawal Time: 0 hours 11 minutes 49 seconds  Total Procedure Duration: 0 hours 13 minutes 44 seconds  Findings:                 The perianal and digital rectal examinations were                            normal.                           A 4 mm polyp was found in the cecum. The polyp was                            sessile. The polyp was removed with a cold biopsy                            forceps. Resection and retrieval were complete.                           Two sessile polyps were found in the transverse                            colon and cecum. The polyps were 6 to 7 mm  in size.                            These polyps were removed with a cold snare.                            Resection and retrieval were complete.                           A few medium-mouthed diverticula were found in the                            descending colon.                           Internal hemorrhoids were found during                            retroflexion. The hemorrhoids were small and Grade                            I (internal hemorrhoids that do not prolapse).                           The exam was otherwise without abnormality on                            direct and retroflexion views.                           Two medium-sized localized angiodysplastic lesions                            without bleeding were found in the ascending colon                            and in the cecum. Complications:            No immediate complications. Estimated blood loss:                             None. Estimated Blood Loss:     Estimated blood loss: none. Impression:               - One 4 mm polyp in the cecum, removed with a cold                            biopsy forceps. Resected and retrieved.                           - Two 6 to 7 mm polyps in the transverse colon and                            in the cecum, removed with a cold snare. Resected  and retrieved.                           - Diverticulosis in the descending colon.                           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                           - The examination was otherwise normal on direct                            and retroflexion views. Recommendation:           - Repeat colonoscopy in 5 years for surveillance if                            polyp(s) are precancerous, otherwise no plans for                            future screening colonoscopies due to age.                           - Patient has a contact number available for                            emergencies. The signs and symptoms of potential                            delayed complications were discussed with the                            patient. Return to normal activities tomorrow.                            Written discharge instructions were provided to the                            patient.                           - Resume previous diet.                           - Continue present medications.                           - Await pathology results. Ladene Artist, MD 12/04/2016 2:47:28 PM This report has been signed electronically.

## 2016-12-05 ENCOUNTER — Telehealth: Payer: Self-pay | Admitting: *Deleted

## 2016-12-05 NOTE — Telephone Encounter (Signed)
  Follow up Call-  Call back number 12/04/2016  Post procedure Call Back phone  # [phone]  Permission to leave phone message Yes  Some recent data might be hidden     Patient questions:  Do you have a fever, pain , or abdominal swelling? No. Pain Score  0 *  Have you tolerated food without any problems? Yes.    Have you been able to return to your normal activities? Yes.    Do you have any questions about your discharge instructions: Diet   No. Medications  No. Follow up visit  No.  Do you have questions or concerns about your Care? No.  Actions: * If pain score is 4 or above: No action needed, pain <4.

## 2016-12-12 ENCOUNTER — Other Ambulatory Visit: Payer: Self-pay | Admitting: Internal Medicine

## 2016-12-18 ENCOUNTER — Encounter: Payer: Self-pay | Admitting: Gastroenterology

## 2017-01-07 ENCOUNTER — Other Ambulatory Visit: Payer: Self-pay | Admitting: Internal Medicine

## 2017-01-13 DIAGNOSIS — H52203 Unspecified astigmatism, bilateral: Secondary | ICD-10-CM | POA: Diagnosis not present

## 2017-01-13 DIAGNOSIS — H524 Presbyopia: Secondary | ICD-10-CM | POA: Diagnosis not present

## 2017-01-13 DIAGNOSIS — H401133 Primary open-angle glaucoma, bilateral, severe stage: Secondary | ICD-10-CM | POA: Diagnosis not present

## 2017-01-13 DIAGNOSIS — H2513 Age-related nuclear cataract, bilateral: Secondary | ICD-10-CM | POA: Diagnosis not present

## 2017-01-13 DIAGNOSIS — H5213 Myopia, bilateral: Secondary | ICD-10-CM | POA: Diagnosis not present

## 2017-01-13 DIAGNOSIS — E113293 Type 2 diabetes mellitus with mild nonproliferative diabetic retinopathy without macular edema, bilateral: Secondary | ICD-10-CM | POA: Diagnosis not present

## 2017-02-10 ENCOUNTER — Other Ambulatory Visit: Payer: Self-pay | Admitting: Internal Medicine

## 2017-02-28 ENCOUNTER — Ambulatory Visit (INDEPENDENT_AMBULATORY_CARE_PROVIDER_SITE_OTHER): Payer: Medicare Other | Admitting: Internal Medicine

## 2017-02-28 ENCOUNTER — Encounter: Payer: Self-pay | Admitting: Internal Medicine

## 2017-02-28 VITALS — BP 130/70 | HR 86 | Temp 98.0°F | Wt 241.8 lb

## 2017-02-28 DIAGNOSIS — I10 Essential (primary) hypertension: Secondary | ICD-10-CM | POA: Diagnosis not present

## 2017-02-28 DIAGNOSIS — K769 Liver disease, unspecified: Secondary | ICD-10-CM

## 2017-02-28 DIAGNOSIS — E0840 Diabetes mellitus due to underlying condition with diabetic neuropathy, unspecified: Secondary | ICD-10-CM

## 2017-02-28 DIAGNOSIS — Z794 Long term (current) use of insulin: Secondary | ICD-10-CM | POA: Diagnosis not present

## 2017-02-28 DIAGNOSIS — E7800 Pure hypercholesterolemia, unspecified: Secondary | ICD-10-CM | POA: Diagnosis not present

## 2017-02-28 LAB — CBC WITH DIFFERENTIAL/PLATELET
BASOS ABS: 0 {cells}/uL (ref 0–200)
Basophils Relative: 0 %
EOS ABS: 255 {cells}/uL (ref 15–500)
EOS PCT: 3 %
HCT: 48.5 % (ref 38.5–50.0)
Hemoglobin: 16 g/dL (ref 13.2–17.1)
LYMPHS PCT: 25 %
Lymphs Abs: 2125 cells/uL (ref 850–3900)
MCH: 27.3 pg (ref 27.0–33.0)
MCHC: 33 g/dL (ref 32.0–36.0)
MCV: 82.6 fL (ref 80.0–100.0)
MONOS PCT: 10 %
MPV: 9.9 fL (ref 7.5–12.5)
Monocytes Absolute: 850 cells/uL (ref 200–950)
NEUTROS ABS: 5270 {cells}/uL (ref 1500–7800)
NEUTROS PCT: 62 %
Platelets: 158 10*3/uL (ref 140–400)
RBC: 5.87 MIL/uL — ABNORMAL HIGH (ref 4.20–5.80)
RDW: 15.1 % — ABNORMAL HIGH (ref 11.0–15.0)
WBC: 8.5 10*3/uL (ref 3.8–10.8)

## 2017-02-28 LAB — LIPID PANEL
CHOL/HDL RATIO: 4.1 ratio (ref <5.0)
CHOLESTEROL: 135 mg/dL (ref <200)
HDL: 33 mg/dL — ABNORMAL LOW (ref >40)
LDL CALC: 49 mg/dL (ref <100)
Triglycerides: 265 mg/dL — ABNORMAL HIGH (ref <150)
VLDL: 53 mg/dL — AB (ref <30)

## 2017-02-28 LAB — COMPREHENSIVE METABOLIC PANEL
ALK PHOS: 74 U/L (ref 40–115)
ALT: 22 U/L (ref 9–46)
AST: 15 U/L (ref 10–35)
Albumin: 4.3 g/dL (ref 3.6–5.1)
BILIRUBIN TOTAL: 0.7 mg/dL (ref 0.2–1.2)
BUN: 23 mg/dL (ref 7–25)
CALCIUM: 9.7 mg/dL (ref 8.6–10.3)
CO2: 23 mmol/L (ref 20–31)
Chloride: 104 mmol/L (ref 98–110)
Creat: 1.37 mg/dL — ABNORMAL HIGH (ref 0.70–1.18)
Glucose, Bld: 178 mg/dL — ABNORMAL HIGH (ref 65–99)
Potassium: 4.3 mmol/L (ref 3.5–5.3)
Sodium: 142 mmol/L (ref 135–146)
Total Protein: 6.8 g/dL (ref 6.1–8.1)

## 2017-02-28 NOTE — Progress Notes (Signed)
Subjective:    Patient ID: Jimmy Carr, male    DOB: Sep 21, 1941, 76 y.o.   MRN: [id number]  HPI  76 year old patient who is seen today in follow-up.  Has not had diabetic follow-up in some time. He states fasting blood sugars are maintained between 101 20. He has had a recent eye examination. He did have follow-up colonoscopy in March that revealed the colonic polyps  No cardiopulmonary complaints  Diabetic complications include a peripheral neuropathy.   Past Medical History:  Diagnosis Date  . AF (atrial fibrillation) ([place])    2D ECHO, 03/01/1987 - normal; NUCLEAR STRESS TEST, 04/10/2006 - EF 65%, no ischemia  . Bruit    CAROTID DOPPLER, 09/20/2008 - normal, no evidence of diameter reduction, significant tortuousity or any other vascular abnormality  . Cerebrovascular accident [facility]) 2003   history of small vessal lacunar stroke 2003  . Diabetes mellitus type II   . Glaucoma   . Hyperlipidemia   . Hypertension   . 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 Peripheral neuropathy      Social History   Social History  . Marital status: Married    Spouse name: N/A  . Number of children: N/A  . Years of education: N/A   Occupational History  . Not on file.   Social History Main Topics  . Smoking status: Never Smoker  . Smokeless tobacco: Never Used  . Alcohol use No  . Drug use: No  . Sexual activity: Not on file   Other Topics Concern  . Not on file   Social History Narrative   Non smoker    On disability    Past Surgical History:  Procedure Laterality Date  . ANAL FISSURE REPAIR    . CARDIAC CATHETERIZATION  02/02/2001   Normal LV systolic function, questionable mitral valve prolapse without mitral regurgitation, no evidence of CAD  . CARDIAC CATHETERIZATION  09/13/2008   Minimal CAD, normal LV systolic function, no evidence of renal artery stenosis, distal aortic disease, or iliac disease    Family History  Problem Relation Age of  Onset  . Diabetes Neg Hx   . Colon cancer Neg Hx     Allergies  Allergen Reactions  . Cheratussin Ac [Guaifenesin-Codeine] Nausea And Vomiting  . Verapamil     Current Outpatient Prescriptions on File Prior to Visit  Medication Sig Dispense Refill  . aspirin EC 81 MG tablet Take 81 mg by mouth daily.    . B-D ULTRAFINE III SHORT PEN 31G X 8 MM MISC use four times a day before meals and at bedtime 100 each 1  . FREESTYLE TEST STRIPS test strip TEST three times a day 100 each 12  . FREESTYLE TEST STRIPS test strip 1 EACH BY OTHER ROUTE 3 (THREE) TIMES DAILY AS NEEDED FOR OTHER. 100 each 5  . hydrochlorothiazide (HYDRODIURIL) 25 MG tablet Take 25 mg by mouth daily.    . insulin glargine (LANTUS) 100 UNIT/ML injection Inject 40 Units into the skin at bedtime.    . insulin lispro (HUMALOG) 100 UNIT/ML injection Inject 40 Units into the skin 3 (three) times daily before meals.    Marland Kitchen LANTUS SOLOSTAR 100 UNIT/ML Solostar Pen inject 60 units subcutaneously AT 10PM 60 mL 3  . latanoprost (XALATAN) 0.005 % ophthalmic solution 1 drop daily.    Marland Kitchen lisinopril (PRINIVIL,ZESTRIL) 40 MG tablet take 1 tablet by mouth once daily 90 tablet 0  . lovastatin (ALTOPREV) 40 MG 24 hr  tablet Take 40 mg by mouth at bedtime.    . metFORMIN (GLUCOPHAGE) 1000 MG tablet take 1 tablet by mouth twice a day with food 180 tablet 0  . timolol (TIMOPTIC-XR) 0.5 % ophthalmic gel-forming Place 1 drop into both eyes daily.     Current Facility-Administered Medications on File Prior to Visit  Medication Dose Route Frequency Provider Last Rate Last Dose  . 0.9 %  sodium chloride infusion  500 mL Intravenous Continuous Ladene Artist, MD        BP 130/70 (BP Location: Left Arm, Patient Position: Sitting, Cuff Size: Large)   Pulse 86   Temp 98 F (36.7 C) (Oral)   Wt 241 lb 12.8 oz (109.7 kg)   SpO2 98%   BMI 32.79 kg/m     Review of Systems  Constitutional: Negative for appetite change, chills, fatigue and fever.    HENT: Negative for congestion, dental problem, ear pain, hearing loss, sore throat, tinnitus, trouble swallowing and voice change.   Eyes: Negative for pain, discharge and visual disturbance.  Respiratory: Negative for cough, chest tightness, wheezing and stridor.   Cardiovascular: Negative for chest pain, palpitations and leg swelling.  Gastrointestinal: Negative for abdominal distention, abdominal pain, blood in stool, constipation, diarrhea, nausea and vomiting.  Genitourinary: Negative for difficulty urinating, discharge, flank pain, genital sores, hematuria and urgency.  Musculoskeletal: Negative for arthralgias, back pain, gait problem, joint swelling, myalgias and neck stiffness.  Skin: Negative for rash.  Neurological: Positive for numbness. Negative for dizziness, syncope, speech difficulty, weakness and headaches.  Hematological: Negative for adenopathy. Does not bruise/bleed easily.  Psychiatric/Behavioral: Negative for behavioral problems and dysphoric mood. The patient is not nervous/anxious.        Objective:   Physical Exam  Constitutional: He is oriented to person, place, and time. He appears well-developed.  Blood pressure well controlled  HENT:  Head: Normocephalic.  Right Ear: External ear normal.  Left Ear: External ear normal.  Eyes: Conjunctivae and EOM are normal.  Neck: Normal range of motion.  Cardiovascular: Normal rate and normal heart sounds.   Pulmonary/Chest: Breath sounds normal.  Abdominal: Bowel sounds are normal.  Musculoskeletal: Normal range of motion. He exhibits edema. He exhibits no tenderness.  Neurological: He is alert and oriented to person, place, and time.  Psychiatric: He has a normal mood and affect. His behavior is normal.          Assessment & Plan:   Diabetes mellitus.  Will check hemoglobin A1c, lipid profile and urine for microalbumin Essential hypertension, well-controlled Diabetic peripheral neuropathy Coronary artery  disease, stable History of splenomegaly.  Presume is secondary to chronic liver disease.  Check follow-up LFTs  Continue basal bolus insulin therapy Review lab Follow-up 3 months Weight loss encouraged  Nyoka Cowden

## 2017-02-28 NOTE — Patient Instructions (Addendum)
WE NOW OFFER   [facility] FAST TRACK!!!  SAME DAY Appointments for ACUTE CARE  Such as: Sprains, Injuries, cuts, abrasions, rashes, muscle pain, joint pain, back pain Colds, flu, sore throats, headache, allergies, cough, fever  Ear pain, sinus and eye infections Abdominal pain, nausea, vomiting, diarrhea, upset stomach Animal/insect bites  3 Easy Ways to Schedule: Walk-In Scheduling Call in scheduling Mychart Sign-up: [email or web address]   Limit your sodium (Salt) intake   Please check your hemoglobin A1c every 3-6  Months    It is important that you exercise regularly, at least 20 minutes 3 to 4 times per week.  If you develop chest pain or shortness of breath seek  medical attention.  You need to lose weight.  Consider a lower calorie diet and regular exercise.

## 2017-03-01 LAB — MICROALBUMIN / CREATININE URINE RATIO
CREATININE, URINE: 105 mg/dL (ref 20–370)
MICROALB UR: 1.8 mg/dL
MICROALB/CREAT RATIO: 17 ug/mg{creat} (ref <30)

## 2017-03-01 LAB — TSH: TSH: 1.43 mIU/L (ref 0.40–4.50)

## 2017-03-01 LAB — HEMOGLOBIN A1C
Hgb A1c MFr Bld: 7 % — ABNORMAL HIGH (ref <5.7)
Mean Plasma Glucose: 154 mg/dL

## 2017-03-18 ENCOUNTER — Ambulatory Visit (INDEPENDENT_AMBULATORY_CARE_PROVIDER_SITE_OTHER): Payer: Medicare Other | Admitting: Internal Medicine

## 2017-03-18 ENCOUNTER — Encounter: Payer: Self-pay | Admitting: Internal Medicine

## 2017-03-18 VITALS — BP 132/80 | HR 79 | Ht 72.0 in | Wt 240.0 lb

## 2017-03-18 DIAGNOSIS — R079 Chest pain, unspecified: Secondary | ICD-10-CM

## 2017-03-18 DIAGNOSIS — I480 Paroxysmal atrial fibrillation: Secondary | ICD-10-CM | POA: Diagnosis not present

## 2017-03-18 DIAGNOSIS — Z8673 Personal history of transient ischemic attack (TIA), and cerebral infarction without residual deficits: Secondary | ICD-10-CM

## 2017-03-18 DIAGNOSIS — R42 Dizziness and giddiness: Secondary | ICD-10-CM | POA: Diagnosis not present

## 2017-03-18 NOTE — Patient Instructions (Addendum)
Your physician has recommended that you wear an event monitor for 2 weeks - placed @ [address] . Event monitors are medical devices that record the heart's electrical activity. Doctors most often [place] these monitors to diagnose arrhythmias. Arrhythmias are problems with the speed or rhythm of the heartbeat. The monitor is a small, portable device. You can wear one while you do your normal daily activities. This is usually used to diagnose what is causing palpitations/syncope (passing out).  Your physician has requested that you have a lexiscan myoview. For further information please visit [email or web address]. Please follow instruction sheet, as given.  Your physician recommends that you schedule a follow-up appointment after your testing.

## 2017-03-20 DIAGNOSIS — Z8673 Personal history of transient ischemic attack (TIA), and cerebral infarction without residual deficits: Secondary | ICD-10-CM | POA: Insufficient documentation

## 2017-03-20 DIAGNOSIS — R42 Dizziness and giddiness: Secondary | ICD-10-CM | POA: Insufficient documentation

## 2017-03-20 NOTE — Progress Notes (Signed)
OFFICE NOTE  Chief Complaint:  Lightheadedness  Primary Care Physician: Marletta Lor, MD  HPI:  Jimmy Carr is a 76 year-old male with a history of PAF. His atrial fib actually started in the mid 80s. He was on Coumadin, beta blockers, and Lanoxin, and over the years, we have been able to wean him off his beta blockers. He is still on a low dose of Lanoxin and several years ago, we stopped the Coumadin, and his CHADS score at that time was 2, but with no atrial fibrillation in over 10 years, we stopped the Coumadin. He is on aspirin. He has had no recurrence of any atrial fib in over 15 years. He has no palpitations. No unusual shortness of breath. No chest pain. No exertional dyspnea. Minimal puffiness of his ankles at the end of the day. No dizziness, slurred speech, or blurred vision.  He is diabetic and has been so for over 30 years. His last hemoglobin A1c was 7.2. He now has a peripheral neuropathy that involves his feet, much greater than his hands. He had been tried on Neurontin with no real help. He checks his blood pressure at home. It has been under good control. He denies arthralgias or myalgias. He walks daily for at least 30 minutes, five to seven days a week. With this, he has no claudication, no unusual breathlessness. He has had no angina. He has diffuse T wave inversion on his EKG. He was catheterized in December 2009 that showed minimal coronary disease and normal LV function. He has no real complaints today, other than the increased cost of digoxin. Exactly unclear to me why he is on digoxin, as this medicine is unlikely to keep him out of atrial fibrillation. He does have a high CHADS2 score which is 4, despite in frequent atrial fibrillation. He continues on aspirin.  There are no new complaints of chest pain or worsening shortness of breath.  Jimmy Carr recently saw [name] in the office for what he thought was atypical chest pain. He recommended a nonsteroidal.  He is also reported more recent palpitations. He's been under significant stress recently. Today he called the office several times and spoke with our triage nurse about concern for atrial fibrillation. I did advise him that he may need to come in for an EKG to see if is actually in atrial fibrillation. Ultimately he did decide to do that.  Jimmy Carr returns today and is doing well. He is asymptomatic and has no more palpitations. We discussed to monitor his last visit if his symptoms did not improve but they seem to have improved. Overall he feels like he did well. He denies any chest pain. He reports good blood sugar control with his diabetes.  I saw Jimmy Carr back today in the office for follow-up. Overall he seems to be doing fairly well. He had recent laboratory work which shows an improvement in his lipid profile, however triglycerides remain elevated over 250. This is likely related to poorly controlled diabetes with an A1c of 7.8. He needs to continue to work on weight loss, exercise and reducing carbohydrates. He has not had any recurrent atrial fibrillation in over 15 years. Despite an elevated CHADSVASC score due to his low burden of A. fib he is only on aspirin therapy.  03/20/2017  Jimmy Carr returns today for follow-up. He denies any recurrent palpitations. He occasionally gets some lightheadedness. Recently he's had a feeling of discomfort in his stomach that goes  up to his chest. He says he feels hollow. He gets some lightheadedness and pain in the back of his neck. He describes it not like a headache but more of an aching sensation. He denies any exertional chest pain but has noticed more fatigue and dyspnea. EKG does not show atrial fibrillation today. He denies any hypoglycemia.  PMHx:  Past Medical History:  Diagnosis Date  . AF (atrial fibrillation) ([place])    2D ECHO, 03/01/1987 - normal; NUCLEAR STRESS TEST, 04/10/2006 - EF 65%, no ischemia  . Bruit    CAROTID DOPPLER, 09/20/2008 -  normal, no evidence of diameter reduction, significant tortuousity or any other vascular abnormality  . Cerebrovascular accident [facility]) 2003   history of small vessal lacunar stroke 2003  . Diabetes mellitus type II   . Glaucoma   . Hyperlipidemia   . Hypertension   . 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 Peripheral neuropathy     Past Surgical History:  Procedure Laterality Date  . ANAL FISSURE REPAIR    . CARDIAC CATHETERIZATION  02/02/2001   Normal LV systolic function, questionable mitral valve prolapse without mitral regurgitation, no evidence of CAD  . CARDIAC CATHETERIZATION  09/13/2008   Minimal CAD, normal LV systolic function, no evidence of renal artery stenosis, distal aortic disease, or iliac disease    FAMHx:  Family History  Problem Relation Age of Onset  . Diabetes Neg Hx   . Colon cancer Neg Hx     SOCHx:   reports that he has never smoked. He has never used smokeless tobacco. He reports that he does not drink alcohol or use drugs.  ALLERGIES:  Allergies  Allergen Reactions  . Cheratussin Ac [Guaifenesin-Codeine] Nausea And Vomiting  . Verapamil     ROS: Pertinent items noted in HPI and remainder of comprehensive ROS otherwise negative.  HOME MEDS: Current Outpatient Prescriptions  Medication Sig Dispense Refill  . aspirin EC 81 MG tablet Take 81 mg by mouth daily.    . B-D ULTRAFINE III SHORT PEN 31G X 8 MM MISC use four times a day before meals and at bedtime 100 each 1  . FREESTYLE TEST STRIPS test strip TEST three times a day 100 each 12  . FREESTYLE TEST STRIPS test strip 1 EACH BY OTHER ROUTE 3 (THREE) TIMES DAILY AS NEEDED FOR OTHER. 100 each 5  . hydrochlorothiazide (HYDRODIURIL) 25 MG tablet Take 25 mg by mouth daily.    . insulin glargine (LANTUS) 100 UNIT/ML injection Inject 40 Units into the skin at bedtime.    . insulin lispro (HUMALOG) 100 UNIT/ML injection Inject 40 Units into the skin 3 (three) times daily before meals.     Marland Kitchen LANTUS SOLOSTAR 100 UNIT/ML Solostar Pen inject 60 units subcutaneously AT 10PM 60 mL 3  . latanoprost (XALATAN) 0.005 % ophthalmic solution 1 drop daily.    Marland Kitchen lisinopril (PRINIVIL,ZESTRIL) 40 MG tablet take 1 tablet by mouth once daily 90 tablet 0  . lovastatin (ALTOPREV) 40 MG 24 hr tablet Take 40 mg by mouth at bedtime.    . metFORMIN (GLUCOPHAGE) 1000 MG tablet take 1 tablet by mouth twice a day with food 180 tablet 0  . timolol (TIMOPTIC-XR) 0.5 % ophthalmic gel-forming Place 1 drop into both eyes daily.     Current Facility-Administered Medications  Medication Dose Route Frequency Provider Last Rate Last Dose  . 0.9 %  sodium chloride infusion  500 mL Intravenous Continuous Ladene Artist, MD  LABS/IMAGING: No results found for this or any previous visit (from the past 48 hour(s)). No results found.  VITALS: BP 132/80   Pulse 79   Ht 6' (1.829 m)   Wt 240 lb (108.9 kg)   BMI 32.55 kg/m   EXAM: General appearance: alert and no distress Neck: no carotid bruit and no JVD Lungs: clear to auscultation bilaterally Heart: regular rate and rhythm, S1, S2 normal, no murmur, click, rub or gallop Abdomen: soft, non-tender; bowel sounds normal; no masses,  no organomegaly Extremities: extremities normal, atraumatic, no cyanosis or edema Pulses: 2+ and symmetric Skin: Skin color, texture, turgor normal. No rashes or lesions Neurologic: Grossly normal Psych: Normal  EKG: Sinus rhythm with fusion beats at 79, anterolateral T-wave changes  ASSESSMENT: 1. Lightheadedness, chest/abdominal discomfort 2. Remote history of paroxysmal atrial fibrillation >15 years ago - on aspirin 3. Hypertension 4. History of stroke, with right central vision loss 5. Type 2 diabetes on insulin 6. Dyslipidemia  PLAN: 1.   Jimmy Carr describes some atypical discomfort in the abdomen that extends to his chest. Is not clear that this is chest pain however he still has risk factors for it. I  like for him to undergo a [employer]. He also describes some discomfort in the back of his neck and feeling of lightheadedness. Given his remote history of PAF and like for him to wear a 2 week monitor to see if we can pick up any arrhythmias.   Follow-up with me afterwards.  Pixie Casino, MD, [facility] Attending Cardiologist Starr C Ulanda Tackett 03/20/2017, 7:48 AM

## 2017-03-27 ENCOUNTER — Other Ambulatory Visit: Payer: Self-pay | Admitting: Internal Medicine

## 2017-03-28 ENCOUNTER — Telehealth (HOSPITAL_COMMUNITY): Payer: Self-pay

## 2017-03-28 NOTE — Telephone Encounter (Signed)
Encounter complete. 

## 2017-04-01 ENCOUNTER — Inpatient Hospital Stay (HOSPITAL_COMMUNITY): Admission: RE | Admit: 2017-04-01 | Payer: Medicare Other | Source: Ambulatory Visit

## 2017-04-07 ENCOUNTER — Other Ambulatory Visit: Payer: Self-pay | Admitting: Internal Medicine

## 2017-04-08 ENCOUNTER — Telehealth (HOSPITAL_COMMUNITY): Payer: Self-pay | Admitting: Cardiology

## 2017-04-08 NOTE — Telephone Encounter (Signed)
I called patient to see if he wanted to reschedule his myoview test and he voiced that he figured out that his symptoms were being caused by his progressive lenses. Since that has been fixed, his symptoms have gone away.

## 2017-04-18 ENCOUNTER — Other Ambulatory Visit: Payer: Self-pay | Admitting: Internal Medicine

## 2017-04-28 ENCOUNTER — Encounter: Payer: Self-pay | Admitting: *Deleted

## 2017-05-08 ENCOUNTER — Ambulatory Visit: Payer: Medicare Other | Admitting: Internal Medicine

## 2017-06-17 ENCOUNTER — Other Ambulatory Visit: Payer: Self-pay | Admitting: Internal Medicine

## 2017-07-15 ENCOUNTER — Other Ambulatory Visit: Payer: Self-pay | Admitting: Internal Medicine

## 2017-07-28 ENCOUNTER — Other Ambulatory Visit: Payer: Self-pay | Admitting: Internal Medicine

## 2017-08-13 ENCOUNTER — Other Ambulatory Visit: Payer: Self-pay | Admitting: Internal Medicine

## 2017-08-15 ENCOUNTER — Other Ambulatory Visit: Payer: Self-pay | Admitting: Internal Medicine

## 2017-08-19 ENCOUNTER — Other Ambulatory Visit: Payer: Self-pay | Admitting: Internal Medicine

## 2017-08-19 NOTE — Telephone Encounter (Signed)
Copied from [place] #[id number]. Topic: Quick Communication - See Telephone Encounter >> Aug 19, 2017 11:39 AM Cleaster Corin, NT wrote: CRM for notification. See Telephone encounter for:   08/19/17. Pt calling to see if he can get a refill on his insulin pen needle 31Gx75mm pt. Has used last needle today and needs rx. By 6pm if available. Pt can be reached at [phone]  Pt. Will use [facility]  [facility] [place], [place] - [address] [place] [address] Phone: [phone] Fax: [phone] Not a 24 hour pharmacy; exact hours not known

## 2017-08-19 NOTE — Telephone Encounter (Signed)
Copied from [place] #[id number]. Topic: Quick Communication - See Telephone Encounter >> Aug 19, 2017 11:39 AM Cleaster Corin, NT wrote: CRM for notification. See Telephone encounter for:   08/19/17. Pt calling to see if he can get a refill on his insulin pen needle 31Gx30mm pt. Has used last needle today and needs rx. By 6pm if available. Pt can be reached at [phone]

## 2017-08-28 ENCOUNTER — Telehealth: Payer: Self-pay | Admitting: *Deleted

## 2017-08-28 NOTE — Telephone Encounter (Signed)
Called [phone] spoke with Loni Muse (4th year pharmacist intern) and reconciled patients medication list.

## 2017-08-28 NOTE — Telephone Encounter (Signed)
Copied from [place] [id number]. Topic: General - Other >> Aug 28, 2017 12:57 PM Cecelia Byars, NT wrote: Reason for CRM: The clinical pharmacy for medicare needs to review medication list please call, [phone] will need patients name ,and dob

## 2017-10-07 ENCOUNTER — Ambulatory Visit: Payer: Self-pay

## 2017-10-07 DIAGNOSIS — R102 Pelvic and perineal pain: Secondary | ICD-10-CM | POA: Diagnosis not present

## 2017-10-07 DIAGNOSIS — E785 Hyperlipidemia, unspecified: Secondary | ICD-10-CM | POA: Diagnosis not present

## 2017-10-07 DIAGNOSIS — I480 Paroxysmal atrial fibrillation: Secondary | ICD-10-CM | POA: Diagnosis not present

## 2017-10-07 DIAGNOSIS — Z7982 Long term (current) use of aspirin: Secondary | ICD-10-CM | POA: Diagnosis not present

## 2017-10-07 DIAGNOSIS — M5136 Other intervertebral disc degeneration, lumbar region: Secondary | ICD-10-CM | POA: Diagnosis not present

## 2017-10-07 DIAGNOSIS — S39012A Strain of muscle, fascia and tendon of lower back, initial encounter: Secondary | ICD-10-CM | POA: Diagnosis not present

## 2017-10-07 DIAGNOSIS — M47896 Other spondylosis, lumbar region: Secondary | ICD-10-CM | POA: Diagnosis not present

## 2017-10-07 DIAGNOSIS — X500XXA Overexertion from strenuous movement or load, initial encounter: Secondary | ICD-10-CM | POA: Diagnosis not present

## 2017-10-07 DIAGNOSIS — I10 Essential (primary) hypertension: Secondary | ICD-10-CM | POA: Diagnosis not present

## 2017-10-07 DIAGNOSIS — E119 Type 2 diabetes mellitus without complications: Secondary | ICD-10-CM | POA: Diagnosis not present

## 2017-10-07 DIAGNOSIS — Z79899 Other long term (current) drug therapy: Secondary | ICD-10-CM | POA: Diagnosis not present

## 2017-10-07 DIAGNOSIS — M1612 Unilateral primary osteoarthritis, left hip: Secondary | ICD-10-CM | POA: Diagnosis not present

## 2017-10-07 DIAGNOSIS — Z794 Long term (current) use of insulin: Secondary | ICD-10-CM | POA: Diagnosis not present

## 2017-10-07 DIAGNOSIS — M25552 Pain in left hip: Secondary | ICD-10-CM | POA: Diagnosis not present

## 2017-10-07 DIAGNOSIS — M545 Low back pain: Secondary | ICD-10-CM | POA: Diagnosis not present

## 2017-10-07 NOTE — Telephone Encounter (Signed)
Pt called c/o sudden onset of pain to his lower left pack. Pt rates pain 10/10 and states "I am barely able to move." Pt states if he gets in a more comfortable position, the pain comes back. Pain is "sharp". Pt states he took 3 aspirins 1 hours ago and has had no effect to his pain. Protocol disposition to ED.  Reason for Disposition . [1] SEVERE back pain (e.g., excruciating) AND [2] sudden onset AND [3] age > 67  Answer Assessment - Initial Assessment Questions 1. ONSET: "When did the pain begin?"      4 days ago  2. LOCATION: "Where does it hurt?" (upper, mid or lower back)     Lower left back 3. SEVERITY: "How bad is the pain?"  (e.g., Scale 1-10; mild, moderate, or severe)   - MILD (1-3): doesn't interfere with normal activities    - MODERATE (4-7): interferes with normal activities or awakens from sleep    - SEVERE (8-10): excruciating pain, unable to do any normal activities      10/10 4. PATTERN: "Is the pain constant?" (e.g., yes, no; constant, intermittent)      Intermittent. Spasms 5. RADIATION: "Does the pain shoot into your legs or elsewhere?"     No  6. CAUSE:  "What do you think is causing the back pain?"      Lifted heavy object 7. BACK OVERUSE:  "Any recent lifting of heavy objects, strenuous work or exercise?"     Lifted a heavy flat of water 8. MEDICATIONS: "What have you taken so far for the pain?" (e.g., nothing, acetaminophen, NSAIDS)     Took 3 aspirins an hour ago 9. NEUROLOGIC SYMPTOMS: "Do you have any weakness, numbness, or problems with bowel/bladder control?"     no 10. OTHER SYMPTOMS: "Do you have any other symptoms?" (e.g., fever, abdominal pain, burning with urination, blood in urine)       no 11. PREGNANCY: "Is there any chance you are pregnant?" (e.g., yes, no; LMP)       n/a  Protocols used: BACK PAIN-A-AH

## 2017-10-07 NOTE — Telephone Encounter (Signed)
Monitor for ER arrival 

## 2017-10-08 NOTE — Telephone Encounter (Signed)
Confirmed patient did arrive in ER on 10/07/2017.

## 2017-10-15 ENCOUNTER — Other Ambulatory Visit: Payer: Self-pay | Admitting: Internal Medicine

## 2017-11-03 ENCOUNTER — Other Ambulatory Visit: Payer: Self-pay | Admitting: Internal Medicine

## 2017-11-19 ENCOUNTER — Other Ambulatory Visit: Payer: Self-pay | Admitting: Internal Medicine

## 2017-11-20 ENCOUNTER — Telehealth: Payer: Self-pay

## 2017-11-20 NOTE — Telephone Encounter (Signed)
Okay to refill for 1 month only Please schedule return office visit

## 2017-11-21 ENCOUNTER — Other Ambulatory Visit: Payer: Self-pay | Admitting: Family Medicine

## 2017-11-21 MED ORDER — GLUCOSE BLOOD VI STRP: ORAL_STRIP | 0 refills | Status: DC

## 2017-11-21 NOTE — Telephone Encounter (Signed)
Pt requested a refill on Prodigy testing strips, which I did send e-scribe.

## 2017-11-21 NOTE — Telephone Encounter (Signed)
Left message for patient to return phone call regarding prescription.

## 2017-11-26 NOTE — Telephone Encounter (Signed)
Appt scheduled as directed

## 2017-12-01 ENCOUNTER — Other Ambulatory Visit: Payer: Self-pay | Admitting: Internal Medicine

## 2017-12-05 NOTE — Telephone Encounter (Signed)
Sent to the pharmacy by e-scribe.  Pt has upcoming appt on 12/31/17.

## 2017-12-15 ENCOUNTER — Other Ambulatory Visit: Payer: Self-pay | Admitting: Internal Medicine

## 2017-12-29 ENCOUNTER — Other Ambulatory Visit: Payer: Self-pay | Admitting: Internal Medicine

## 2017-12-31 ENCOUNTER — Encounter: Payer: Self-pay | Admitting: Internal Medicine

## 2017-12-31 ENCOUNTER — Ambulatory Visit (INDEPENDENT_AMBULATORY_CARE_PROVIDER_SITE_OTHER): Payer: Medicare Other | Admitting: Internal Medicine

## 2017-12-31 VITALS — BP 120/78 | HR 74 | Temp 98.3°F | Wt 244.0 lb

## 2017-12-31 DIAGNOSIS — Z794 Long term (current) use of insulin: Secondary | ICD-10-CM | POA: Diagnosis not present

## 2017-12-31 DIAGNOSIS — E7800 Pure hypercholesterolemia, unspecified: Secondary | ICD-10-CM | POA: Diagnosis not present

## 2017-12-31 DIAGNOSIS — I480 Paroxysmal atrial fibrillation: Secondary | ICD-10-CM | POA: Diagnosis not present

## 2017-12-31 DIAGNOSIS — E0840 Diabetes mellitus due to underlying condition with diabetic neuropathy, unspecified: Secondary | ICD-10-CM | POA: Diagnosis not present

## 2017-12-31 DIAGNOSIS — I10 Essential (primary) hypertension: Secondary | ICD-10-CM

## 2017-12-31 DIAGNOSIS — Z86010 Personal history of colonic polyps: Secondary | ICD-10-CM | POA: Diagnosis not present

## 2017-12-31 LAB — COMPREHENSIVE METABOLIC PANEL
ALT: 27 U/L (ref 0–53)
AST: 18 U/L (ref 0–37)
Albumin: 4.2 g/dL (ref 3.5–5.2)
Alkaline Phosphatase: 60 U/L (ref 39–117)
BILIRUBIN TOTAL: 0.9 mg/dL (ref 0.2–1.2)
BUN: 19 mg/dL (ref 6–23)
CO2: 31 meq/L (ref 19–32)
CREATININE: 1.19 mg/dL (ref 0.40–1.50)
Calcium: 9.7 mg/dL (ref 8.4–10.5)
Chloride: 99 mEq/L (ref 96–112)
GFR: 63.38 mL/min (ref >60.00)
GLUCOSE: 117 mg/dL — AB (ref 70–99)
Potassium: 3.8 mEq/L (ref 3.5–5.1)
SODIUM: 140 meq/L (ref 135–145)
Total Protein: 7 g/dL (ref 6.0–8.3)

## 2017-12-31 LAB — MICROALBUMIN / CREATININE URINE RATIO
Creatinine,U: 93.2 mg/dL
Microalb Creat Ratio: 2.2 mg/g (ref 0.0–30.0)
Microalb, Ur: 2 mg/dL — ABNORMAL HIGH (ref 0.0–1.9)

## 2017-12-31 LAB — CBC WITH DIFFERENTIAL/PLATELET
Basophils Absolute: 0.1 10*3/uL (ref 0.0–0.1)
Basophils Relative: 0.6 % (ref 0.0–3.0)
Eosinophils Absolute: 0.2 10*3/uL (ref 0.0–0.7)
Eosinophils Relative: 2.1 % (ref 0.0–5.0)
HCT: 48.2 % (ref 39.0–52.0)
HEMOGLOBIN: 16.3 g/dL (ref 13.0–17.0)
LYMPHS ABS: 2.1 10*3/uL (ref 0.7–4.0)
Lymphocytes Relative: 24.9 % (ref 12.0–46.0)
MCHC: 33.8 g/dL (ref 30.0–36.0)
MCV: 82.5 fl (ref 78.0–100.0)
MONO ABS: 0.9 10*3/uL (ref 0.1–1.0)
Monocytes Relative: 10.6 % (ref 3.0–12.0)
NEUTROS PCT: 61.8 % (ref 43.0–77.0)
Neutro Abs: 5.2 10*3/uL (ref 1.4–7.7)
Platelets: 176 10*3/uL (ref 150.0–400.0)
RBC: 5.85 Mil/uL — AB (ref 4.22–5.81)
RDW: 14.9 % (ref 11.5–15.5)
WBC: 8.4 10*3/uL (ref 4.0–10.5)

## 2017-12-31 LAB — LIPID PANEL
CHOL/HDL RATIO: 3
Cholesterol: 126 mg/dL (ref 0–200)
HDL: 38.2 mg/dL — ABNORMAL LOW (ref >39.00)
LDL Cholesterol: 63 mg/dL (ref 0–99)
NONHDL: 87.99
Triglycerides: 124 mg/dL (ref 0.0–149.0)
VLDL: 24.8 mg/dL (ref 0.0–40.0)

## 2017-12-31 LAB — TSH: TSH: 1.86 u[IU]/mL (ref 0.35–4.50)

## 2017-12-31 LAB — HEMOGLOBIN A1C: HEMOGLOBIN A1C: 7 % — AB (ref 4.6–6.5)

## 2017-12-31 NOTE — Progress Notes (Signed)
Subjective:    Patient ID: Jimmy Carr, male    DOB: 10-Dec-1940, 77 y.o.   MRN: [id number]  HPI 77 year old patient who is seen today for a annual evaluation and subsequent Medicare wellness visit He has a history of insulin requiring diabetes and remains on basal bolus insulin.  He states fasting blood sugars generally run between 120 and 130 with prandial sugars between 101 60 depending on his diet He is scheduled for follow-up eye examination and cardiology follow-up next month. Diabetic complications include retinopathy and peripheral neuropathy. He had colonoscopy last year that did reveal an adenomatous polyp.  A 5-year interval suggested.  Past Medical History:  Diagnosis Date  . AF (atrial fibrillation) ([place])    2D ECHO, 03/01/1987 - normal; NUCLEAR STRESS TEST, 04/10/2006 - EF 65%, no ischemia  . Bruit    CAROTID DOPPLER, 09/20/2008 - normal, no evidence of diameter reduction, significant tortuousity or any other vascular abnormality  . Cerebrovascular accident [facility]) 2003   history of small vessal lacunar stroke 2003  . Diabetes mellitus type II   . Glaucoma   . Hyperlipidemia   . Hypertension   . 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 Peripheral neuropathy      Social History   Socioeconomic History  . Marital status: Married    Spouse name: Not on file  . Number of children: Not on file  . Years of education: Not on file  . Highest education level: Not on file  Occupational History  . Not on file  Social Needs  . Financial resource strain: Not on file  . Food insecurity:    Worry: Not on file    Inability: Not on file  . Transportation needs:    Medical: Not on file    Non-medical: Not on file  Tobacco Use  . Smoking status: Never Smoker  . Smokeless tobacco: Never Used  Substance and Sexual Activity  . Alcohol use: No  . Drug use: No  . Sexual activity: Not on file  Lifestyle  . Physical activity:    Days per week: Not on file    Minutes per session: Not on file  . Stress: Not on file  Relationships  . Social connections:    Talks on phone: Not on file    Gets together: Not on file    Attends religious service: Not on file    Active member of club or organization: Not on file    Attends meetings of clubs or organizations: Not on file    Relationship status: Not on file  . Intimate partner violence:    Fear of current or ex partner: Not on file    Emotionally abused: Not on file    Physically abused: Not on file    Forced sexual activity: Not on file  Other Topics Concern  . Not on file  Social History Narrative   Non smoker    On disability    Past Surgical History:  Procedure Laterality Date  . ANAL FISSURE REPAIR    . CARDIAC CATHETERIZATION  02/02/2001   Normal LV systolic function, questionable mitral valve prolapse without mitral regurgitation, no evidence of CAD  . CARDIAC CATHETERIZATION  09/13/2008   Minimal CAD, normal LV systolic function, no evidence of renal artery stenosis, distal aortic disease, or iliac disease    Family History  Problem Relation Age of Onset  . Diabetes Neg Hx   . Colon cancer Neg Hx  Allergies  Allergen Reactions  . Cheratussin Ac [Guaifenesin-Codeine] Nausea And Vomiting  . Verapamil     Current Outpatient Medications on File Prior to Visit  Medication Sig Dispense Refill  . aspirin EC 81 MG tablet Take 81 mg by mouth daily.    . B-D ULTRAFINE III SHORT PEN 31G X 8 MM MISC USE FOUR TIMES DAILY, BEFORE MEALS AND AT BEDTIME 200 each 2  . FREESTYLE TEST STRIPS test strip TEST three times a day 100 each 12  . FREESTYLE TEST STRIPS test strip 1 EACH BY OTHER ROUTE 3 (THREE) TIMES DAILY AS NEEDED FOR OTHER. 100 each 5  . glucose blood (PRODIGY NO CODING BLOOD GLUC) test strip USE TO CHECK BLOOD SUGAR THREE TIMES DAILY 100 each 0  . HUMALOG KWIKPEN 100 UNIT/ML KiwkPen INJECT SUBCUTANEOUSLY THREE TIMES A DAY(50 UNITS AT BREAKFAST 35 UNITS AT LUNCH AND 50 UNITS AT  SUPPER) 45 mL 0  . hydrochlorothiazide (HYDRODIURIL) 25 MG tablet TAKE 1 TABLET BY MOUTH ONCE DAILY 90 tablet 0  . insulin glargine (LANTUS) 100 UNIT/ML injection Inject 40 Units into the skin at bedtime.    . insulin lispro (HUMALOG) 100 UNIT/ML injection Inject 40 Units into the skin 3 (three) times daily before meals.    Marland Kitchen LANTUS SOLOSTAR 100 UNIT/ML Solostar Pen INJECT 60 UNITS UNDER THE SKIN AT 10:00 PM 15 mL 0  . latanoprost (XALATAN) 0.005 % ophthalmic solution 1 drop daily.    Marland Kitchen lisinopril (PRINIVIL,ZESTRIL) 40 MG tablet TAKE 1 TABLET BY MOUTH ONCE DAILY 90 tablet 0  . lovastatin (ALTOPREV) 40 MG 24 hr tablet Take 40 mg by mouth at bedtime.    . lovastatin (MEVACOR) 40 MG tablet TAKE 1 TABLET BY MOUTH AT BEDTIME 90 tablet 0  . metFORMIN (GLUCOPHAGE) 1000 MG tablet TAKE 1 TABLET BY MOUTH TWICE DAILY WITH FOOD 180 tablet 0  . PRODIGY TWIST TOP LANCETS 28G MISC USE TO TEST FOR BLOOD SUGAR THREE TIMES DAILY. 100 each 3  . timolol (TIMOPTIC-XR) 0.5 % ophthalmic gel-forming Place 1 drop into both eyes daily.     Current Facility-Administered Medications on File Prior to Visit  Medication Dose Route Frequency Provider Last Rate Last Dose  . 0.9 %  sodium chloride infusion  500 mL Intravenous Continuous Ladene Artist, MD        BP 120/78 (BP Location: Right Arm, Patient Position: Sitting, Cuff Size: Large)   Pulse 74   Temp 98.3 F (36.8 C) (Oral)   Wt 244 lb (110.7 kg)   SpO2 98%   BMI 33.09 kg/m   Subsequent Medicare wellness visit  1. Risk factors, based on past  M,S,F history.  Patient has a history of nonocclusive coronary artery disease.  Cardiovascular risk factors include hypertension dyslipidemia and diabetes  2.  Physical activities: Remains fairly active.  No exercise restrictions  3.  Depression/mood: No history of major depression or mood disorder  4.  Hearing: No significant deficits  5.  ADL's: Independent  6.  Fall risk: Mildly increased due to peripheral  neuropathy  7.  Home safety: No problems identified height and weight stable.  8.  Height weight, and visual acuity; is followed very closely by ophthalmology due to diabetic retinopathy  9.  Counseling:  Modest weight loss increase activity all encouraged  10. Lab orders based on risk factors: We will check updated lab including lipid profile and urine for microalbumin 11. Referral : Follow-up cardiology and ophthalmology  12. Care plan: Continue efforts  at aggressive risk factor modification  13. Cognitive assessment: Alert and oriented with normal affect.  No cognitive dysfunction  14. Screening: Patient provided with a written and personalized 5-10 year screening schedule in the AVS. will need 5-year colonoscopy intervals  15. Provider List Update: Primary care GI ophthalmology and cardiology     Review of Systems  Constitutional: Negative for appetite change, chills, fatigue and fever.  HENT: Negative for congestion, dental problem, ear pain, hearing loss, sore throat, tinnitus, trouble swallowing and voice change.   Eyes: Negative for pain, discharge and visual disturbance.  Respiratory: Negative for cough, chest tightness, wheezing and stridor.   Cardiovascular: Negative for chest pain, palpitations and leg swelling.  Gastrointestinal: Negative for abdominal distention, abdominal pain, blood in stool, constipation, diarrhea, nausea and vomiting.  Genitourinary: Negative for difficulty urinating, discharge, flank pain, genital sores, hematuria and urgency.  Musculoskeletal: Negative for arthralgias, back pain, gait problem, joint swelling, myalgias and neck stiffness.  Skin: Negative for rash.  Neurological: Negative for dizziness, syncope, speech difficulty, weakness, numbness and headaches.  Hematological: Negative for adenopathy. Does not bruise/bleed easily.  Psychiatric/Behavioral: Negative for behavioral problems and dysphoric mood. The patient is not nervous/anxious.         Objective:   Physical Exam  Constitutional: He is oriented to person, place, and time. He appears well-developed.  HENT:  Head: Normocephalic.  Right Ear: External ear normal.  Left Ear: External ear normal.  Eyes: Conjunctivae and EOM are normal.  Neck: Normal range of motion.  Cardiovascular: Normal rate and normal heart sounds.  Decreased right dorsalis pedis pulse  Pulmonary/Chest: Breath sounds normal.  Abdominal: Bowel sounds are normal.  Genitourinary:  Genitourinary Comments: Prostatic enlargement  Musculoskeletal: Normal range of motion. He exhibits no edema or tenderness.  Neurological: He is alert and oriented to person, place, and time.  Decreased vibratory sensation and monofilament testing distal legs  Skin:  Senile skin changes  Psychiatric: He has a normal mood and affect. His behavior is normal.          Assessment & Plan:   Subsequent Medicare wellness visit Diabetes mellitus.  Will continue basal bolus insulin.  Will review hemoglobin A1c and urine for microalbumin Hypertension Coronary artery disease.  Follow-up cardiology.  The patient did have a nuclear stress test scheduled last year but this was not performed Dyslipidemia continue statin therapy Diabetic peripheral neuropathy Diabetic retinopathy.  Follow-up ophthalmology  Return in 3 months for follow-up  Nyoka Cowden

## 2017-12-31 NOTE — Patient Instructions (Addendum)
Cardiology follow-up as scheduled  Limit your sodium (Salt) intake  Please see your eye doctor yearly to check for diabetic eye damage    It is important that you exercise regularly, at least 20 minutes 3 to 4 times per week.  If you develop chest pain or shortness of breath seek  medical attention.  You need to lose weight.  Consider a lower calorie diet and regular exercise.   Health Maintenance, Male A healthy lifestyle and preventive care is important for your health and wellness. Ask your health care provider about what schedule of regular examinations is right for you. What should I know about weight and diet? Eat a Healthy Diet  Eat plenty of vegetables, fruits, whole grains, low-fat dairy products, and lean protein.  Do not eat a lot of foods high in solid fats, added sugars, or salt.  Maintain a Healthy Weight Regular exercise can help you achieve or maintain a healthy weight. You should:  Do at least 150 minutes of exercise each week. The exercise should increase your heart rate and make you sweat (moderate-intensity exercise).  Do strength-training exercises at least twice a week.  Watch Your Levels of Cholesterol and Blood Lipids  Have your blood tested for lipids and cholesterol every 5 years starting at 75 years of age. If you are at high risk for heart disease, you should start having your blood tested when you are 75 years old. You may need to have your cholesterol levels checked more often if: ? Your lipid or cholesterol levels are high. ? You are older than 75 years of age. ? You are at high risk for heart disease.  What should I know about cancer screening? Many types of cancers can be detected early and may often be prevented. Lung Cancer  You should be screened every year for lung cancer if: ? You are a current smoker who has smoked for at least 30 years. ? You are a former smoker who has quit within the past 15 years.  Talk to your health care provider  about your screening options, when you should start screening, and how often you should be screened.  Colorectal Cancer  Routine colorectal cancer screening usually begins at 75 years of age and should be repeated every 5-10 years until you are 75 years old. You may need to be screened more often if early forms of precancerous polyps or small growths are found. Your health care provider may recommend screening at an earlier age if you have risk factors for colon cancer.  Your health care provider may recommend using home test kits to check for hidden blood in the stool.  A small camera at the end of a tube can be used to examine your colon (sigmoidoscopy or colonoscopy). This checks for the earliest forms of colorectal cancer.  Prostate and Testicular Cancer  Depending on your age and overall health, your health care provider may do certain tests to screen for prostate and testicular cancer.  Talk to your health care provider about any symptoms or concerns you have about testicular or prostate cancer.  Skin Cancer  Check your skin from head to toe regularly.  Tell your health care provider about any new moles or changes in moles, especially if: ? There is a change in a mole's size, shape, or color. ? You have a mole that is larger than a pencil eraser.  Always use sunscreen. Apply sunscreen liberally and repeat throughout the day.  Protect yourself by wearing  long sleeves, pants, a wide-brimmed hat, and sunglasses when outside.  What should I know about heart disease, diabetes, and high blood pressure?  If you are 7-16 years of age, have your blood pressure checked every 3-5 years. If you are 60 years of age or older, have your blood pressure checked every year. You should have your blood pressure measured twice-once when you are at a hospital or clinic, and once when you are not at a hospital or clinic. Record the average of the two measurements. To check your blood pressure when you  are not at a hospital or clinic, you can use: ? An automated blood pressure machine at a pharmacy. ? A home blood pressure monitor.  Talk to your health care provider about your target blood pressure.  If you are between 32-10 years old, ask your health care provider if you should take aspirin to prevent heart disease.  Have regular diabetes screenings by checking your fasting blood sugar level. ? If you are at a normal weight and have a low risk for diabetes, have this test once every three years after the age of 43. ? If you are overweight and have a high risk for diabetes, consider being tested at a younger age or more often.  A one-time screening for abdominal aortic aneurysm (AAA) by ultrasound is recommended for men aged 110-75 years who are current or former smokers. What should I know about preventing infection? Hepatitis B If you have a higher risk for hepatitis B, you should be screened for this virus. Talk with your health care provider to find out if you are at risk for hepatitis B infection. Hepatitis C Blood testing is recommended for:  Everyone born from [birth year] through 1963.  Anyone with known risk factors for hepatitis C.  Sexually Transmitted Diseases (STDs)  You should be screened each year for STDs including gonorrhea and chlamydia if: ? You are sexually active and are younger than 75 years of age. ? You are older than 75 years of age and your health care provider tells you that you are at risk for this type of infection. ? Your sexual activity has changed since you were last screened and you are at an increased risk for chlamydia or gonorrhea. Ask your health care provider if you are at risk.  Talk with your health care provider about whether you are at high risk of being infected with HIV. Your health care provider may recommend a prescription medicine to help prevent HIV infection.  What else can I do?  Schedule regular health, dental, and eye exams.  Stay  current with your vaccines (immunizations).  Do not use any tobacco products, such as cigarettes, chewing tobacco, and e-cigarettes. If you need help quitting, ask your health care provider.  Limit alcohol intake to no more than 2 drinks per day. One drink equals 12 ounces of beer, 5 ounces of wine, or 1 ounces of hard liquor.  Do not use street drugs.  Do not share needles.  Ask your health care provider for help if you need support or information about quitting drugs.  Tell your health care provider if you often feel depressed.  Tell your health care provider if you have ever been abused or do not feel safe at home. This information is not intended to replace advice given to you by your health care provider. Make sure you discuss any questions you have with your health care provider. Document Released: 03/14/2008 Document Revised: 05/15/2016 Document Reviewed:  06/20/2015 Elsevier Interactive Patient Education  [employer].

## 2018-01-01 LAB — HEPATITIS C ANTIBODY
HEP C AB: NONREACTIVE
SIGNAL TO CUT-OFF: 0.01 (ref <1.00)

## 2018-01-09 ENCOUNTER — Telehealth: Payer: Self-pay | Admitting: Family Medicine

## 2018-01-09 NOTE — Telephone Encounter (Signed)
Copied from [place] [id number]. Topic: Quick Communication - See Telephone Encounter >> Jan 09, 2018  9:19 AM Antonieta Iba C wrote: CRM for notification. See Telephone encounter for: 01/09/18.  Pt called in to request a medication for cough and drainage. Pt says that he wasn't able to sleep well last night due to coughing. Please advise.   CB: [phone]

## 2018-01-13 DIAGNOSIS — R05 Cough: Secondary | ICD-10-CM | POA: Diagnosis not present

## 2018-01-13 DIAGNOSIS — J069 Acute upper respiratory infection, unspecified: Secondary | ICD-10-CM | POA: Diagnosis not present

## 2018-01-13 MED ORDER — HYDROCODONE-HOMATROPINE 5-1.5 MG/5ML PO SYRP: 5.0000 mL | ORAL_SOLUTION | Freq: Four times a day (QID) | ORAL | 0 refills | Status: DC | PRN

## 2018-01-13 NOTE — Telephone Encounter (Signed)
Rx printed, to PCP for signature. 

## 2018-01-13 NOTE — Telephone Encounter (Signed)
Pt notified of results/instructions and verbalized understanding.  He does have reaction of N/V w/ codeine, but states he has tolerated Hydromet without side effects in the past.

## 2018-01-13 NOTE — Telephone Encounter (Signed)
Okay generic Hydromet 6 ounces 1 teaspoon every 6 hours as needed for cough.  Will need to print this prescription and have patient pick up

## 2018-01-13 NOTE — Telephone Encounter (Signed)
Please advise 

## 2018-01-22 ENCOUNTER — Other Ambulatory Visit: Payer: Self-pay | Admitting: Internal Medicine

## 2018-01-29 ENCOUNTER — Telehealth: Payer: Self-pay | Admitting: Internal Medicine

## 2018-01-29 NOTE — Telephone Encounter (Signed)
Left message to return call 

## 2018-01-29 NOTE — Telephone Encounter (Signed)
Copied from [place] [id number]. Topic: Quick Communication - See Telephone Encounter >> Jan 29, 2018  2:53 PM Ivar Drape wrote: CRM for notification. See Telephone encounter for: 01/29/18. Patient would like the results of his recent labs.  Please call.  He would also like a copy of them.

## 2018-01-29 NOTE — Telephone Encounter (Signed)
!  Spoke to patient and he wanted numbers for A1c and iron levels. Patient was advise. He also wanted tot make sure everything else was fine since he has been feeling very tired lately. I advise patient that Dr.Kwiatkowski was out of the office until Monday. Patient verbalized understanding.

## 2018-02-02 NOTE — Telephone Encounter (Signed)
Spoke to patient and gave him the results. No further action needed.

## 2018-02-02 NOTE — Telephone Encounter (Signed)
All labs are normal.  Total cholesterol 126.  Hemoglobin A1c 7.0

## 2018-02-04 ENCOUNTER — Other Ambulatory Visit: Payer: Self-pay | Admitting: Internal Medicine

## 2018-02-16 ENCOUNTER — Other Ambulatory Visit: Payer: Self-pay | Admitting: Internal Medicine

## 2018-03-02 ENCOUNTER — Other Ambulatory Visit: Payer: Self-pay | Admitting: Internal Medicine

## 2018-03-04 ENCOUNTER — Other Ambulatory Visit: Payer: Self-pay | Admitting: Internal Medicine

## 2018-03-16 ENCOUNTER — Other Ambulatory Visit: Payer: Self-pay | Admitting: Internal Medicine

## 2018-03-23 ENCOUNTER — Other Ambulatory Visit: Payer: Self-pay | Admitting: Internal Medicine

## 2018-03-23 DIAGNOSIS — H401133 Primary open-angle glaucoma, bilateral, severe stage: Secondary | ICD-10-CM | POA: Diagnosis not present

## 2018-03-23 DIAGNOSIS — E113293 Type 2 diabetes mellitus with mild nonproliferative diabetic retinopathy without macular edema, bilateral: Secondary | ICD-10-CM | POA: Diagnosis not present

## 2018-03-23 DIAGNOSIS — H2513 Age-related nuclear cataract, bilateral: Secondary | ICD-10-CM | POA: Diagnosis not present

## 2018-03-25 NOTE — Telephone Encounter (Signed)
Sent to the pharmacy by e-scribe for 1 month.  Pt due 03/2018 for A1C follow up.

## 2018-04-20 ENCOUNTER — Other Ambulatory Visit: Payer: Self-pay | Admitting: Internal Medicine

## 2018-04-21 ENCOUNTER — Ambulatory Visit (INDEPENDENT_AMBULATORY_CARE_PROVIDER_SITE_OTHER): Payer: Medicare Other | Admitting: Internal Medicine

## 2018-04-21 ENCOUNTER — Encounter: Payer: Self-pay | Admitting: Internal Medicine

## 2018-04-21 VITALS — BP 122/72 | HR 76 | Ht 76.0 in | Wt 244.6 lb

## 2018-04-21 DIAGNOSIS — E7800 Pure hypercholesterolemia, unspecified: Secondary | ICD-10-CM | POA: Diagnosis not present

## 2018-04-21 DIAGNOSIS — I2510 Atherosclerotic heart disease of native coronary artery without angina pectoris: Secondary | ICD-10-CM

## 2018-04-21 DIAGNOSIS — I10 Essential (primary) hypertension: Secondary | ICD-10-CM

## 2018-04-21 DIAGNOSIS — I480 Paroxysmal atrial fibrillation: Secondary | ICD-10-CM

## 2018-04-21 NOTE — Patient Instructions (Signed)
Your physician wants you to follow-up in: ONE YEAR with Dr. Hilty. You will receive a reminder letter in the mail two months in advance. If you don't receive a letter, please call our office to schedule the follow-up appointment.  

## 2018-04-21 NOTE — Progress Notes (Signed)
OFFICE NOTE  Chief Complaint:  No complaint  Primary Care Physician: Marletta Lor, MD  HPI:  Jimmy Carr is a 77 year-old male with a history of PAF. His atrial fib actually started in the mid 80s. He was on Coumadin, beta blockers, and Lanoxin, and over the years, we have been able to wean him off his beta blockers. He is still on a low dose of Lanoxin and several years ago, we stopped the Coumadin, and his CHADS score at that time was 2, but with no atrial fibrillation in over 10 years, we stopped the Coumadin. He is on aspirin. He has had no recurrence of any atrial fib in over 15 years. He has no palpitations. No unusual shortness of breath. No chest pain. No exertional dyspnea. Minimal puffiness of his ankles at the end of the day. No dizziness, slurred speech, or blurred vision.  He is diabetic and has been so for over 30 years. His last hemoglobin A1c was 7.2. He now has a peripheral neuropathy that involves his feet, much greater than his hands. He had been tried on Neurontin with no real help. He checks his blood pressure at home. It has been under good control. He denies arthralgias or myalgias. He walks daily for at least 30 minutes, five to seven days a week. With this, he has no claudication, no unusual breathlessness. He has had no angina. He has diffuse T wave inversion on his EKG. He was catheterized in December 2009 that showed minimal coronary disease and normal LV function. He has no real complaints today, other than the increased cost of digoxin. Exactly unclear to me why he is on digoxin, as this medicine is unlikely to keep him out of atrial fibrillation. He does have a high CHADS2 score which is 4, despite in frequent atrial fibrillation. He continues on aspirin.  There are no new complaints of chest pain or worsening shortness of breath.  Jimmy Carr recently saw [name] in the office for what he thought was atypical chest pain. He recommended a nonsteroidal. He  is also reported more recent palpitations. He's been under significant stress recently. Today he called the office several times and spoke with our triage nurse about concern for atrial fibrillation. I did advise him that he may need to come in for an EKG to see if is actually in atrial fibrillation. Ultimately he did decide to do that.  Jimmy Carr returns today and is doing well. He is asymptomatic and has no more palpitations. We discussed to monitor his last visit if his symptoms did not improve but they seem to have improved. Overall he feels like he did well. He denies any chest pain. He reports good blood sugar control with his diabetes.  I saw Jimmy Carr back today in the office for follow-up. Overall he seems to be doing fairly well. He had recent laboratory work which shows an improvement in his lipid profile, however triglycerides remain elevated over 250. This is likely related to poorly controlled diabetes with an A1c of 7.8. He needs to continue to work on weight loss, exercise and reducing carbohydrates. He has not had any recurrent atrial fibrillation in over 15 years. Despite an elevated CHADSVASC score due to his low burden of A. fib he is only on aspirin therapy.  03/20/2017  Jimmy Carr returns today for follow-up. He denies any recurrent palpitations. He occasionally gets some lightheadedness. Recently he's had a feeling of discomfort in his stomach that  goes up to his chest. He says he feels hollow. He gets some lightheadedness and pain in the back of his neck. He describes it not like a headache but more of an aching sensation. He denies any exertional chest pain but has noticed more fatigue and dyspnea. EKG does not show atrial fibrillation today. He denies any hypoglycemia.  04/21/2018  Jimmy Carr returns today for follow-up.  Overall he is without complaints.  When I last saw him he was having some upper mid epigastric to lower central chest pain.  Ordered a stress test and a repeat  monitor for concern for possible A. fib.  He says his symptoms resolved spontaneously and he had neither test performed.  He says he is done well since that without any recurrent palpitations or chest pain.  In general he has had really good control over his blood sugars.  Recent hemoglobin A1c is 7 however on insulin and metformin.  Blood pressure is also well controlled 122/72.  LDL-C less than 70 on lovastatin.  EKG personally reviewed today shows sinus rhythm with nonspecific T wave changes at 76.  PMHx:  Past Medical History:  Diagnosis Date  . AF (atrial fibrillation) ([place])    2D ECHO, 03/01/1987 - normal; NUCLEAR STRESS TEST, 04/10/2006 - EF 65%, no ischemia  . Bruit    CAROTID DOPPLER, 09/20/2008 - normal, no evidence of diameter reduction, significant tortuousity or any other vascular abnormality  . Cerebrovascular accident [facility]) 2003   history of small vessal lacunar stroke 2003  . Diabetes mellitus type II   . Glaucoma   . Hyperlipidemia   . Hypertension   . 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 Peripheral neuropathy     Past Surgical History:  Procedure Laterality Date  . ANAL FISSURE REPAIR    . CARDIAC CATHETERIZATION  02/02/2001   Normal LV systolic function, questionable mitral valve prolapse without mitral regurgitation, no evidence of CAD  . CARDIAC CATHETERIZATION  09/13/2008   Minimal CAD, normal LV systolic function, no evidence of renal artery stenosis, distal aortic disease, or iliac disease    FAMHx:  Family History  Problem Relation Age of Onset  . Diabetes Neg Hx   . Colon cancer Neg Hx     SOCHx:   reports that he has never smoked. He has never used smokeless tobacco. He reports that he does not drink alcohol or use drugs.  ALLERGIES:  Allergies  Allergen Reactions  . Cheratussin Ac [Guaifenesin-Codeine] Nausea And Vomiting  . Verapamil     ROS: Pertinent items noted in HPI and remainder of comprehensive ROS otherwise  negative.  HOME MEDS: Current Outpatient Medications  Medication Sig Dispense Refill  . aspirin EC 81 MG tablet Take 81 mg by mouth daily.    . B-D ULTRAFINE III SHORT PEN 31G X 8 MM MISC USE FOUR TIMES DAILY, BEFORE MEALS AND AT BEDTIME 200 each 2  . FREESTYLE TEST STRIPS test strip TEST three times a day 100 each 12  . FREESTYLE TEST STRIPS test strip 1 EACH BY OTHER ROUTE 3 (THREE) TIMES DAILY AS NEEDED FOR OTHER. 100 each 5  . glucose blood (PRODIGY NO CODING BLOOD GLUC) test strip USE TO CHECK BLOOD SUGAR THREE TIMES DAILY 100 each 0  . HUMALOG KWIKPEN 100 UNIT/ML KiwkPen INJECT SUBCUTANEOUSLY THREE TIMES A DAY(50 UNITS AT BREAKFAST 35 UNITS AT LUNCH AND 50 UNITS AT SUPPER) 45 mL 0  . hydrochlorothiazide (HYDRODIURIL) 25 MG tablet TAKE 1 TABLET  BY MOUTH ONCE DAILY 90 tablet 0  . insulin glargine (LANTUS) 100 UNIT/ML injection Inject 40 Units into the skin at bedtime.    . insulin lispro (HUMALOG) 100 UNIT/ML injection Inject 40 Units into the skin 3 (three) times daily before meals.    Marland Kitchen LANTUS SOLOSTAR 100 UNIT/ML Solostar Pen INJECT 60 UNITS UNDER THE SKIN AT 10:00 PM 15 mL 0  . latanoprost (XALATAN) 0.005 % ophthalmic solution 1 drop daily.    Marland Kitchen lisinopril (PRINIVIL,ZESTRIL) 40 MG tablet TAKE 1 TABLET BY MOUTH ONCE DAILY 90 tablet 0  . lovastatin (ALTOPREV) 40 MG 24 hr tablet Take 40 mg by mouth at bedtime.    . lovastatin (MEVACOR) 40 MG tablet TAKE 1 TABLET BY MOUTH AT BEDTIME 90 tablet 0  . metFORMIN (GLUCOPHAGE) 1000 MG tablet TAKE 1 TABLET BY MOUTH TWICE DAILY WITH FOOD 180 tablet 0  . PRODIGY NO CODING BLOOD GLUC test strip USE TO CHECK BLOOD SUGAR THREE TIMES DAILY 100 each 0  . PRODIGY TWIST TOP LANCETS 28G MISC USE TO TEST FOR BLOOD SUGAR THREE TIMES DAILY. 100 each 3  . timolol (TIMOPTIC-XR) 0.5 % ophthalmic gel-forming Place 1 drop into both eyes daily.    Marland Kitchen HYDROcodone-homatropine (HYCODAN) 5-1.5 MG/5ML syrup Take 5 mLs by mouth every 6 (six) hours as needed for cough.  (Patient not taking: Reported on 04/21/2018) 120 mL 0   Current Facility-Administered Medications  Medication Dose Route Frequency Provider Last Rate Last Dose  . 0.9 %  sodium chloride infusion  500 mL Intravenous Continuous Ladene Artist, MD        LABS/IMAGING: No results found for this or any previous visit (from the past 48 hour(s)). No results found.  VITALS: BP 122/72   Pulse 76   Ht 6\' 4"  (1.93 m)   Wt 244 lb 9.6 oz (110.9 kg)   BMI 29.77 kg/m   EXAM: General appearance: alert and no distress Neck: no carotid bruit and no JVD Lungs: clear to auscultation bilaterally Heart: regular rate and rhythm, S1, S2 normal, no murmur, click, rub or gallop Abdomen: soft, non-tender; bowel sounds normal; no masses,  no organomegaly Extremities: extremities normal, atraumatic, no cyanosis or edema Pulses: 2+ and symmetric Skin: Skin color, texture, turgor normal. No rashes or lesions Neurologic: Grossly normal Psych: Normal  EKG: Sinus rhythm at 76 with nonspecific T wave changes-personally reviewed  ASSESSMENT: 1. Remote history of paroxysmal atrial fibrillation >15 years ago - on aspirin 2. Hypertension 3. History of stroke, with right central vision loss 4. Type 2 diabetes on insulin 5. Dyslipidemia  PLAN: 1.   Jimmy Carr continues to do well without any new chest pain.  He has a history of remote PAF about 15 years ago without recurrence that I am aware of.  Blood pressure is at goal.  His diabetes is well controlled with hemoglobin A1c of 7.  Cholesterol LDL is less than 70 on a statin.  Overall doing well and we will plan to see him back annually or sooner as necessary.  Pixie Casino, MD, [facility], [place] Director of the [facility]  Card[facility] Diplomate of the American Board of Clinical Lipidology Attending Cardiologist  Direct Dial: [phone]  Fax: [phone]  Website:   [email or web address] Toelle 04/21/2018, 10:58 AM

## 2018-04-22 ENCOUNTER — Other Ambulatory Visit: Payer: Self-pay | Admitting: Internal Medicine

## 2018-04-24 DIAGNOSIS — L570 Actinic keratosis: Secondary | ICD-10-CM | POA: Diagnosis not present

## 2018-04-24 DIAGNOSIS — L821 Other seborrheic keratosis: Secondary | ICD-10-CM | POA: Diagnosis not present

## 2018-05-14 ENCOUNTER — Other Ambulatory Visit: Payer: Self-pay | Admitting: Internal Medicine

## 2018-05-28 ENCOUNTER — Other Ambulatory Visit: Payer: Self-pay | Admitting: Internal Medicine

## 2018-06-04 ENCOUNTER — Other Ambulatory Visit: Payer: Self-pay | Admitting: Internal Medicine

## 2018-06-08 ENCOUNTER — Telehealth: Payer: Self-pay | Admitting: Internal Medicine

## 2018-06-08 NOTE — Telephone Encounter (Signed)
Copied from [place] [id number]. Topic: Quick Communication - See Telephone Encounter >> Jun 08, 2018 12:49 PM Neva Seat wrote: Pt now wanting to establish his PCP as Dr. Volanda Napoleon. Pt also wanting a new blood glucose meter since his current one is not working properly. He is requesting the [employer] meter - lancets - test strips.

## 2018-06-09 MED ORDER — ACCU-CHEK AVIVA PLUS W/DEVICE KIT: PACK | 0 refills | Status: AC

## 2018-06-09 MED ORDER — GLUCOSE BLOOD VI STRP: ORAL_STRIP | 12 refills | Status: DC

## 2018-06-09 MED ORDER — ACCU-CHEK SOFTCLIX LANCETS MISC: 12 refills | Status: DC

## 2018-06-09 NOTE — Telephone Encounter (Signed)
New meter sent in.

## 2018-06-09 NOTE — Telephone Encounter (Signed)
lmom for pt to call back to get transferred over to Dr. Volanda Napoleon.

## 2018-06-09 NOTE — Telephone Encounter (Signed)
Jimmy Carr A 06/09/2018 10:38 AM    Pharmacy called in and stated that they can not bill his medicare plan for this meter and test strips.  It will have to sent to patients pharmacy that does that.  She just wanted to call with a fyi, so they are not able to fill this.    Calling from [employer] in [place]

## 2018-06-10 NOTE — Telephone Encounter (Signed)
Spoke to pharmacist and they advised me to send the Rx to [facility] and stated it may be covered there. Pt updated with the information and verbalized understadnig. Pt stated that he only called for a new glucometer because he thought his old one wasn't working.  Pt will call back to see if he still needs it.

## 2018-06-16 NOTE — Telephone Encounter (Signed)
Transfer appointment is 06/26/18 at 11:30 with Dr. Volanda Napoleon

## 2018-06-18 ENCOUNTER — Other Ambulatory Visit: Payer: Self-pay | Admitting: Internal Medicine

## 2018-06-19 ENCOUNTER — Other Ambulatory Visit: Payer: Self-pay | Admitting: Internal Medicine

## 2018-06-22 ENCOUNTER — Other Ambulatory Visit: Payer: Self-pay | Admitting: Internal Medicine

## 2018-06-26 ENCOUNTER — Ambulatory Visit (INDEPENDENT_AMBULATORY_CARE_PROVIDER_SITE_OTHER): Payer: Medicare Other | Admitting: Family Medicine

## 2018-06-26 ENCOUNTER — Encounter: Payer: Self-pay | Admitting: Family Medicine

## 2018-06-26 VITALS — BP 120/80 | HR 92 | Temp 98.3°F | Wt 246.0 lb

## 2018-06-26 DIAGNOSIS — I2510 Atherosclerotic heart disease of native coronary artery without angina pectoris: Secondary | ICD-10-CM

## 2018-06-26 DIAGNOSIS — I10 Essential (primary) hypertension: Secondary | ICD-10-CM

## 2018-06-26 DIAGNOSIS — Z794 Long term (current) use of insulin: Secondary | ICD-10-CM | POA: Diagnosis not present

## 2018-06-26 DIAGNOSIS — E7800 Pure hypercholesterolemia, unspecified: Secondary | ICD-10-CM | POA: Diagnosis not present

## 2018-06-26 DIAGNOSIS — E0840 Diabetes mellitus due to underlying condition with diabetic neuropathy, unspecified: Secondary | ICD-10-CM

## 2018-06-26 MED ORDER — HYDROCHLOROTHIAZIDE 25 MG PO TABS: 25.0000 mg | ORAL_TABLET | Freq: Every day | ORAL | 0 refills | Status: DC

## 2018-06-26 MED ORDER — LISINOPRIL 40 MG PO TABS: 40.0000 mg | ORAL_TABLET | Freq: Every day | ORAL | 0 refills | Status: DC

## 2018-06-26 NOTE — Progress Notes (Signed)
Subjective:    Patient ID: Jimmy Carr, male    DOB: 06-14-1941, 77 y.o.   MRN: [id number]  No chief complaint on file.   HPI Patient was seen today for f/u on chronic conditions and TOC, previously seen by Dr. Burnice Logan.  HTN: -Taking lisinopril 40 mg, hydrochlorothiazide 25 mg daily -notes did not get 90-day supply  -denies HAs, CP, blurred vision  DM 2: -taking metformin 1000mg  BID, Lantus 60 units, and humalog 40 units TID with meals -pt denies symptomatic hypo or hyperglycemia -checking fsbs at home  HLD: -taking lovastatin 40 mg daily -denies myalgias  Past Medical History:  Diagnosis Date  . AF (atrial fibrillation) ([place])    2D ECHO, 03/01/1987 - normal; NUCLEAR STRESS TEST, 04/10/2006 - EF 65%, no ischemia  . Bruit    CAROTID DOPPLER, 09/20/2008 - normal, no evidence of diameter reduction, significant tortuousity or any other vascular abnormality  . Cerebrovascular accident [facility]) 2003   history of small vessal lacunar stroke 2003  . Diabetes mellitus type II   . Glaucoma   . Hyperlipidemia   . Hypertension   . 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 Peripheral neuropathy     Allergies  Allergen Reactions  . Cheratussin Ac [Guaifenesin-Codeine] Nausea And Vomiting  . Verapamil     ROS General: Denies fever, chills, night sweats, changes in weight, changes in appetite HEENT: Denies headaches, ear pain, changes in vision, rhinorrhea, sore throat CV: Denies CP, palpitations, SOB, orthopnea Pulm: Denies SOB, cough, wheezing GI: Denies abdominal pain, nausea, vomiting, diarrhea, constipation GU: Denies dysuria, hematuria, frequency, vaginal discharge Msk: Denies muscle cramps, joint pains Neuro: Denies weakness, numbness, tingling Skin: Denies rashes, bruising Psych: Denies depression, anxiety, hallucinations     Objective:    Blood pressure 120/80, pulse 92, temperature 98.3 F (36.8 C), temperature source Oral, weight 246 lb (111.6  kg), SpO2 97 %.   Gen. Pleasant, well-nourished, in no distress, normal affect   HEENT: [place]/AT, face symmetric, no scleral icterus, PERRLA, nares patent without drainage Lungs: no accessory muscle use, CTAB, no wheezes or rales Cardiovascular: RRR, no m/r/g, no peripheral edema Neuro:  A&Ox3, CN II-XII intact, normal gait  Wt Readings from Last 3 Encounters:  06/26/18 246 lb (111.6 kg)  04/21/18 244 lb 9.6 oz (110.9 kg)  12/31/17 244 lb (110.7 kg)    Lab Results  Component Value Date   WBC 8.4 12/31/2017   HGB 16.3 12/31/2017   HCT 48.2 12/31/2017   PLT 176.0 12/31/2017   GLUCOSE 117 (H) 12/31/2017   CHOL 126 12/31/2017   TRIG 124.0 12/31/2017   HDL 38.20 (L) 12/31/2017   LDLDIRECT 67.0 05/16/2015   LDLCALC 63 12/31/2017   ALT 27 12/31/2017   AST 18 12/31/2017   NA 140 12/31/2017   K 3.8 12/31/2017   CL 99 12/31/2017   CREATININE 1.19 12/31/2017   BUN 19 12/31/2017   CO2 31 12/31/2017   TSH 1.86 12/31/2017   PSA 1.70 05/04/2014   HGBA1C 7.0 (H) 12/31/2017   MICROALBUR 2.0 (H) 12/31/2017    Assessment/Plan:  Essential hypertension  -controlled -discussed lifestyle modifications - Plan: lisinopril (PRINIVIL,ZESTRIL) 40 MG tablet, hydrochlorothiazide (HYDRODIURIL) 25 MG tablet  Diabetes mellitus due to underlying condition with diabetic neuropathy, with long-term current use of insulin (HCC) -stable -hgb 7.0% on 12/31/17 -continue metformin 1000 mg BID, Lantus 60 qhs, humalog 40 units TID w/ meals  Pure hypercholesterolemia -continue lovastatin 40 mg daily -discussed lifestyle modifications  F/u  prn  Grier Mitts, MD

## 2018-06-26 NOTE — Patient Instructions (Signed)
Diabetes Mellitus and Exercise Exercising regularly is important for your overall health, especially when you have diabetes (diabetes mellitus). Exercising is not only about losing weight. It has many health benefits, such as increasing muscle strength and bone density and reducing body fat and stress. This leads to improved fitness, flexibility, and endurance, all of which result in better overall health. Exercise has additional benefits for people with diabetes, including:  Reducing appetite.  Helping to lower and control blood glucose.  Lowering blood pressure.  Helping to control amounts of fatty substances (lipids) in the blood, such as cholesterol and triglycerides.  Helping the body to respond better to insulin (improving insulin sensitivity).  Reducing how much insulin the body needs.  Decreasing the risk for heart disease by: ? Lowering cholesterol and triglyceride levels. ? Increasing the levels of good cholesterol. ? Lowering blood glucose levels.  What is my activity plan? Your health care provider or certified diabetes educator can help you make a plan for the type and frequency of exercise (activity plan) that works for you. Make sure that you:  Do at least 150 minutes of moderate-intensity or vigorous-intensity exercise each week. This could be brisk walking, biking, or water aerobics. ? Do stretching and strength exercises, such as yoga or weightlifting, at least 2 times a week. ? Spread out your activity over at least 3 days of the week.  Get some form of physical activity every day. ? Do not go more than 2 days in a row without some kind of physical activity. ? Avoid being inactive for more than 90 minutes at a time. Take frequent breaks to walk or stretch.  Choose a type of exercise or activity that you enjoy, and set realistic goals.  Start slowly, and gradually increase the intensity of your exercise over time.  What do I need to know about managing my  diabetes?  Check your blood glucose before and after exercising. ? If your blood glucose is higher than 240 mg/dL (13.3 mmol/L) before you exercise, check your urine for ketones. If you have ketones in your urine, do not exercise until your blood glucose returns to normal.  Know the symptoms of low blood glucose (hypoglycemia) and how to treat it. Your risk for hypoglycemia increases during and after exercise. Common symptoms of hypoglycemia can include: ? Hunger. ? Anxiety. ? Sweating and feeling clammy. ? Confusion. ? Dizziness or feeling light-headed. ? Increased heart rate or palpitations. ? Blurry vision. ? Tingling or numbness around the mouth, lips, or tongue. ? Tremors or shakes. ? Irritability.  Keep a rapid-acting carbohydrate snack available before, during, and after exercise to help prevent or treat hypoglycemia.  Avoid injecting insulin into areas of the body that are going to be exercised. For example, avoid injecting insulin into: ? The arms, when playing tennis. ? The legs, when jogging.  Keep records of your exercise habits. Doing this can help you and your health care provider adjust your diabetes management plan as needed. Write down: ? Food that you eat before and after you exercise. ? Blood glucose levels before and after you exercise. ? The type and amount of exercise you have done. ? When your insulin is expected to peak, if you use insulin. Avoid exercising at times when your insulin is peaking.  When you start a new exercise or activity, work with your health care provider to make sure the activity is safe for you, and to adjust your insulin, medicines, or food intake as needed.    Drink plenty of water while you exercise to prevent dehydration or heat stroke. Drink enough fluid to keep your urine clear or pale yellow. This information is not intended to replace advice given to you by your health care provider. Make sure you discuss any questions you have with  your health care provider. Document Released: 12/07/2003 Document Revised: 04/05/2016 Document Reviewed: 02/26/2016 Elsevier Interactive Patient Education  2018 [employer].  Managing Your Hypertension Hypertension is commonly called high blood pressure. This is when the force of your blood pressing against the walls of your arteries is too strong. Arteries are blood vessels that carry blood from your heart throughout your body. Hypertension forces the heart to work harder to pump blood, and may cause the arteries to become narrow or stiff. Having untreated or uncontrolled hypertension can cause heart attack, stroke, kidney disease, and other problems. What are blood pressure readings? A blood pressure reading consists of a higher number over a lower number. Ideally, your blood pressure should be below 120/80. The first ("top") number is called the systolic pressure. It is a measure of the pressure in your arteries as your heart beats. The second ("bottom") number is called the diastolic pressure. It is a measure of the pressure in your arteries as the heart relaxes. What does my blood pressure reading mean? Blood pressure is classified into four stages. Based on your blood pressure reading, your health care provider may use the following stages to determine what type of treatment you need, if any. Systolic pressure and diastolic pressure are measured in a unit called mm Hg. Normal  Systolic pressure: below 761.  Diastolic pressure: below 80. Elevated  Systolic pressure: 607-371.  Diastolic pressure: below 80. Hypertension stage 1  Systolic pressure: 062-694.  Diastolic pressure: 85-46. Hypertension stage 2  Systolic pressure: 270 or above.  Diastolic pressure: 90 or above. What health risks are associated with hypertension? Managing your hypertension is an important responsibility. Uncontrolled hypertension can lead to:  A heart attack.  A stroke.  A weakened blood vessel  (aneurysm).  Heart failure.  Kidney damage.  Eye damage.  Metabolic syndrome.  Memory and concentration problems.  What changes can I make to manage my hypertension? Hypertension can be managed by making lifestyle changes and possibly by taking medicines. Your health care provider will help you make a plan to bring your blood pressure within a normal range. Eating and drinking  Eat a diet that is high in fiber and potassium, and low in salt (sodium), added sugar, and fat. An example eating plan is called the DASH (Dietary Approaches to Stop Hypertension) diet. To eat this way: ? Eat plenty of fresh fruits and vegetables. Try to fill half of your plate at each meal with fruits and vegetables. ? Eat whole grains, such as whole wheat pasta, brown rice, or whole grain bread. Fill about one quarter of your plate with whole grains. ? Eat low-fat diary products. ? Avoid fatty cuts of meat, processed or cured meats, and poultry with skin. Fill about one quarter of your plate with lean proteins such as fish, chicken without skin, beans, eggs, and tofu. ? Avoid premade and processed foods. These tend to be higher in sodium, added sugar, and fat.  Reduce your daily sodium intake. Most people with hypertension should eat less than 1,500 mg of sodium a day.  Limit alcohol intake to no more than 1 drink a day for nonpregnant women and 2 drinks a day for men. One drink equals  12 oz of beer, 5 oz of wine, or 1 oz of hard liquor. Lifestyle  Work with your health care provider to maintain a healthy body weight, or to lose weight. Ask what an ideal weight is for you.  Get at least 30 minutes of exercise that causes your heart to beat faster (aerobic exercise) most days of the week. Activities may include walking, swimming, or biking.  Include exercise to strengthen your muscles (resistance exercise), such as weight lifting, as part of your weekly exercise routine. Try to do these types of exercises for  30 minutes at least 3 days a week.  Do not use any products that contain nicotine or tobacco, such as cigarettes and e-cigarettes. If you need help quitting, ask your health care provider.  Control any long-term (chronic) conditions you have, such as high cholesterol or diabetes. Monitoring  Monitor your blood pressure at home as told by your health care provider. Your personal target blood pressure may vary depending on your medical conditions, your age, and other factors.  Have your blood pressure checked regularly, as often as told by your health care provider. Working with your health care provider  Review all the medicines you take with your health care provider because there may be side effects or interactions.  Talk with your health care provider about your diet, exercise habits, and other lifestyle factors that may be contributing to hypertension.  Visit your health care provider regularly. Your health care provider can help you create and adjust your plan for managing hypertension. Will I need medicine to control my blood pressure? Your health care provider may prescribe medicine if lifestyle changes are not enough to get your blood pressure under control, and if:  Your systolic blood pressure is 130 or higher.  Your diastolic blood pressure is 80 or higher.  Take medicines only as told by your health care provider. Follow the directions carefully. Blood pressure medicines must be taken as prescribed. The medicine does not work as well when you skip doses. Skipping doses also puts you at risk for problems. Contact a health care provider if:  You think you are having a reaction to medicines you have taken.  You have repeated (recurrent) headaches.  You feel dizzy.  You have swelling in your ankles.  You have trouble with your vision. Get help right away if:  You develop a severe headache or confusion.  You have unusual weakness or numbness, or you feel faint.  You  have severe pain in your chest or abdomen.  You vomit repeatedly.  You have trouble breathing. Summary  Hypertension is when the force of blood pumping through your arteries is too strong. If this condition is not controlled, it may put you at risk for serious complications.  Your personal target blood pressure may vary depending on your medical conditions, your age, and other factors. For most people, a normal blood pressure is less than 120/80.  Hypertension is managed by lifestyle changes, medicines, or both. Lifestyle changes include weight loss, eating a healthy, low-sodium diet, exercising more, and limiting alcohol. This information is not intended to replace advice given to you by your health care provider. Make sure you discuss any questions you have with your health care provider. Document Released: 06/10/2012 Document Revised: 08/14/2016 Document Reviewed: 08/14/2016 Elsevier Interactive Patient Education  2018 [employer].  Peripheral Neuropathy Peripheral neuropathy is a type of nerve damage. It affects nerves that carry signals between the spinal cord and other parts of the  body. These are called peripheral nerves. With peripheral neuropathy, one nerve or a group of nerves may be damaged. What are the causes? Many things can damage peripheral nerves. For some people with peripheral neuropathy, the cause is unknown. Some causes include:  Diabetes. This is the most common cause of peripheral neuropathy.  Injury to a nerve.  Pressure or stress on a nerve that lasts a long time.  Too little vitamin B. Alcoholism can lead to this.  Infections.  Autoimmune diseases, such as multiple sclerosis and systemic lupus erythematosus.  Inherited nerve diseases.  Some medicines, such as cancer drugs.  Toxic substances, such as lead and mercury.  Too little blood flowing to the legs.  Kidney disease.  Thyroid disease.  What are the signs or symptoms? Different people have  different symptoms. The symptoms you have will depend on which of your nerves is damaged. Common symptoms include:  Loss of feeling (numbness) in the feet and hands.  Tingling in the feet and hands.  Pain that burns.  Very sensitive skin.  Weakness.  Not being able to move a part of the body (paralysis).  Muscle twitching.  Clumsiness or poor coordination.  Loss of balance.  Not being able to control your bladder.  Feeling dizzy.  Sexual problems.  How is this diagnosed? Peripheral neuropathy is a symptom, not a disease. Finding the cause of peripheral neuropathy can be hard. To figure that out, your health care provider will take a medical history and do a physical exam. A neurological exam will also be done. This involves checking things affected by your brain, spinal cord, and nerves (nervous system). For example, your health care provider will check your reflexes, how you move, and what you can feel. Other types of tests may also be ordered, such as:  Blood tests.  A test of the fluid in your spinal cord.  Imaging tests, such as CT scans or an MRI.  Electromyography (EMG). This test checks the nerves that control muscles.  Nerve conduction velocity tests. These tests check how fast messages pass through your nerves.  Nerve biopsy. A small piece of nerve is removed. It is then checked under a microscope.  How is this treated?  Medicine is often used to treat peripheral neuropathy. Medicines may include: ? Pain-relieving medicines. Prescription or over-the-counter medicine may be suggested. ? Antiseizure medicine. This may be used for pain. ? Antidepressants. These also may help ease pain from neuropathy. ? Lidocaine. This is a numbing medicine. You might wear a patch or be given a shot. ? Mexiletine. This medicine is typically used to help control irregular heart rhythms.  Surgery. Surgery may be needed to relieve pressure on a nerve or to destroy a nerve that is  causing pain.  Physical therapy to help movement.  Assistive devices to help movement. Follow these instructions at home:  Only take over-the-counter or prescription medicines as directed by your health care provider. Follow the instructions carefully for any given medicines. Do not take any other medicines without first getting approval from your health care provider.  If you have diabetes, work closely with your health care provider to keep your blood sugar under control.  If you have numbness in your feet: ? Check every day for signs of injury or infection. Watch for redness, warmth, and swelling. ? Wear padded socks and comfortable shoes. These help protect your feet.  Do not do things that put pressure on your damaged nerve.  Do not smoke. Smoking keeps  blood from getting to damaged nerves.  Avoid or limit alcohol. Too much alcohol can cause a lack of B vitamins. These vitamins are needed for healthy nerves.  Develop a good support system. Coping with peripheral neuropathy can be stressful. Talk to a mental health specialist or join a support group if you are struggling.  Follow up with your health care provider as directed. Contact a health care provider if:  You have new signs or symptoms of peripheral neuropathy.  You are struggling emotionally from dealing with peripheral neuropathy.  You have a fever. Get help right away if:  You have an injury or infection that is not healing.  You feel very dizzy or begin vomiting.  You have chest pain.  You have trouble breathing. This information is not intended to replace advice given to you by your health care provider. Make sure you discuss any questions you have with your health care provider. Document Released: 09/06/2002 Document Revised: 02/22/2016 Document Reviewed: 05/24/2013 Elsevier Interactive Patient Education  2017 [employer].

## 2018-06-29 ENCOUNTER — Encounter: Payer: Self-pay | Admitting: Family Medicine

## 2018-07-01 ENCOUNTER — Other Ambulatory Visit: Payer: Self-pay | Admitting: Internal Medicine

## 2018-07-17 ENCOUNTER — Other Ambulatory Visit: Payer: Self-pay | Admitting: Family Medicine

## 2018-07-17 MED ORDER — INSULIN GLARGINE 100 UNIT/ML SOLOSTAR PEN: PEN_INJECTOR | SUBCUTANEOUS | 2 refills | Status: DC

## 2018-07-17 NOTE — Telephone Encounter (Signed)
Dr.Banks pt 

## 2018-07-17 NOTE — Telephone Encounter (Signed)
Copied from [place] [phone]. Topic: General - Other >> Jul 17, 2018  9:47 AM Janace Aris A wrote: Medication: LANTUS SOLOSTAR 100 UNIT/ML Solostar Pen  Has the patient contacted their pharmacy? yes  Preferred Pharmacy (with phone number or street name): [id number] - [place], [place] [phone] (Phone) [phone] (Fax)    Agent: Please be advised that RX refills may take up to 3 business days. We ask that you follow-up with your pharmacy.

## 2018-07-20 ENCOUNTER — Other Ambulatory Visit: Payer: Self-pay | Admitting: Internal Medicine

## 2018-07-27 ENCOUNTER — Other Ambulatory Visit: Payer: Self-pay | Admitting: Family Medicine

## 2018-07-27 NOTE — Telephone Encounter (Signed)
Rxs done. 

## 2018-07-27 NOTE — Telephone Encounter (Signed)
Pt checking status on med refills.

## 2018-08-23 ENCOUNTER — Other Ambulatory Visit: Payer: Self-pay | Admitting: Internal Medicine

## 2018-08-27 ENCOUNTER — Other Ambulatory Visit: Payer: Self-pay | Admitting: Family Medicine

## 2018-08-28 ENCOUNTER — Other Ambulatory Visit: Payer: Self-pay | Admitting: Internal Medicine

## 2018-08-31 ENCOUNTER — Other Ambulatory Visit: Payer: Self-pay | Admitting: Family Medicine

## 2018-09-02 ENCOUNTER — Other Ambulatory Visit: Payer: Self-pay | Admitting: Family Medicine

## 2018-09-02 MED ORDER — INSULIN LISPRO (1 UNIT DIAL) 100 UNIT/ML (KWIKPEN): PEN_INJECTOR | SUBCUTANEOUS | 0 refills | Status: DC

## 2018-09-02 NOTE — Telephone Encounter (Signed)
Copied from [place] [phone]. Topic: Quick Communication - Rx Refill/Question >> Sep 02, 2018 12:34 PM Mcneil, Ja-Kwan wrote: Medication: HUMALOG KWIKPEN 100 UNIT/ML KiwkPen  Has the patient contacted their pharmacy? yes   Preferred Pharmacy (with phone number or street name): [id number] - [place], [place] [phone] (Phone)  [phone] (Fax)  Agent: Please be advised that RX refills may take up to 3 business days. We ask that you follow-up with your pharmacy.

## 2018-09-18 ENCOUNTER — Other Ambulatory Visit: Payer: Self-pay | Admitting: Family Medicine

## 2018-09-18 NOTE — Telephone Encounter (Signed)
Pt needs an appointment for further refills  

## 2018-09-24 ENCOUNTER — Other Ambulatory Visit: Payer: Self-pay | Admitting: Family Medicine

## 2018-09-24 DIAGNOSIS — I10 Essential (primary) hypertension: Secondary | ICD-10-CM

## 2018-10-02 ENCOUNTER — Other Ambulatory Visit: Payer: Self-pay

## 2018-10-02 ENCOUNTER — Other Ambulatory Visit: Payer: Self-pay | Admitting: Family Medicine

## 2018-10-02 MED ORDER — PRODIGY TWIST TOP LANCETS 28G MISC: 0 refills | Status: DC

## 2018-10-07 ENCOUNTER — Other Ambulatory Visit: Payer: Self-pay | Admitting: Family Medicine

## 2018-10-19 ENCOUNTER — Other Ambulatory Visit: Payer: Self-pay | Admitting: Family Medicine

## 2018-10-19 ENCOUNTER — Ambulatory Visit (INDEPENDENT_AMBULATORY_CARE_PROVIDER_SITE_OTHER): Payer: Medicare Other

## 2018-10-19 ENCOUNTER — Ambulatory Visit (INDEPENDENT_AMBULATORY_CARE_PROVIDER_SITE_OTHER): Payer: Medicare Other | Admitting: Family Medicine

## 2018-10-19 ENCOUNTER — Encounter: Payer: Self-pay | Admitting: Family Medicine

## 2018-10-19 VITALS — BP 118/68 | HR 84 | Temp 98.3°F | Wt 248.0 lb

## 2018-10-19 DIAGNOSIS — M47812 Spondylosis without myelopathy or radiculopathy, cervical region: Secondary | ICD-10-CM | POA: Diagnosis not present

## 2018-10-19 DIAGNOSIS — M25512 Pain in left shoulder: Secondary | ICD-10-CM

## 2018-10-19 DIAGNOSIS — M5410 Radiculopathy, site unspecified: Secondary | ICD-10-CM

## 2018-10-19 DIAGNOSIS — M19012 Primary osteoarthritis, left shoulder: Secondary | ICD-10-CM | POA: Diagnosis not present

## 2018-10-19 MED ORDER — MELOXICAM 7.5 MG PO TABS: 7.5000 mg | ORAL_TABLET | Freq: Every day | ORAL | 0 refills | Status: DC

## 2018-10-19 NOTE — Progress Notes (Signed)
Subjective:    Patient ID: Jimmy Carr, male    DOB: 06-17-1941, 78 y.o.   MRN: [id number]  No chief complaint on file.   HPI Patient was seen today for ongoing concern.  Pt endorses left shoulder and elbow pain x3 weeks.  Pt states the pain shoots from his shoulder down to his left elbow and into his arm and fingers.  Pt also endorses numbness and tingling in his fingers.  Pain is worse at night, with driving, and when has arm hanging at his side.  Pain is better when elevating his arm above his head or resting it on his knee.  Pt has tried aspirin and Aleve for his symptoms.  Pt denies injury.  Pt has a h/o DM with neuropathy.  States fsbs has been good, 120s-140s.  Pt taking metformin, lantus 60 and humalog 50 in am, 35 with lunch and 50 with dinner.  Past Medical History:  Diagnosis Date  . AF (atrial fibrillation) ([place])    2D ECHO, 03/01/1987 - normal; NUCLEAR STRESS TEST, 04/10/2006 - EF 65%, no ischemia  . Bruit    CAROTID DOPPLER, 09/20/2008 - normal, no evidence of diameter reduction, significant tortuousity or any other vascular abnormality  . Cerebrovascular accident [facility]) 2003   history of small vessal lacunar stroke 2003  . Diabetes mellitus type II   . Glaucoma   . Hyperlipidemia   . Hypertension   . 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 Peripheral neuropathy     Allergies  Allergen Reactions  . Cheratussin Ac [Guaifenesin-Codeine] Nausea And Vomiting  . Verapamil     ROS General: Denies fever, chills, night sweats, changes in weight, changes in appetite HEENT: Denies headaches, ear pain, changes in vision, rhinorrhea, sore throat CV: Denies CP, palpitations, SOB, orthopnea Pulm: Denies SOB, cough, wheezing GI: Denies abdominal pain, nausea, vomiting, diarrhea, constipation GU: Denies dysuria, hematuria, frequency, vaginal discharge Msk: Denies muscle cramps, joint pains  +L shoulder pain and elbow pain Neuro: Denies weakness   + numbness,  tingling in L arm and fingers. Skin: Denies rashes, bruising Psych: Denies depression, anxiety, hallucinations    Objective:    Blood pressure 118/68, pulse 84, temperature 98.3 F (36.8 C), temperature source Oral, weight 248 lb (112.5 kg), SpO2 98 %.  Gen. Pleasant, well-nourished, in no distress, normal affect   HEENT: [place]/AT, face symmetric, no scleral icterus, PERRLA, nares patent without drainage  Lungs: no accessory muscle use Cardiovascular: RRR,no peripheral edema Musculoskeletal: No TTP of spine, shoulder, elbow,or hand.  Numbness in LUE with axial loading and rotation of cervical spine. FROM in b/l UEs.  No deformities, no cyanosis or clubbing, normal tone Neuro:  A&Ox3, CN II-XII intact, normal gait Skin:  Warm, no rash.  Numerous actinic keratosis of arms and scalp.   Wt Readings from Last 3 Encounters:  10/19/18 248 lb (112.5 kg)  06/26/18 246 lb (111.6 kg)  04/21/18 244 lb 9.6 oz (110.9 kg)    Lab Results  Component Value Date   WBC 8.4 12/31/2017   HGB 16.3 12/31/2017   HCT 48.2 12/31/2017   PLT 176.0 12/31/2017   GLUCOSE 117 (H) 12/31/2017   CHOL 126 12/31/2017   TRIG 124.0 12/31/2017   HDL 38.20 (L) 12/31/2017   LDLDIRECT 67.0 05/16/2015   LDLCALC 63 12/31/2017   ALT 27 12/31/2017   AST 18 12/31/2017   NA 140 12/31/2017   K 3.8 12/31/2017   CL 99 12/31/2017   CREATININE 1.19  12/31/2017   BUN 19 12/31/2017   CO2 31 12/31/2017   TSH 1.86 12/31/2017   PSA 1.70 05/04/2014   HGBA1C 7.0 (H) 12/31/2017   MICROALBUR 2.0 (H) 12/31/2017    Assessment/Plan:  Radiculopathy of arm  -Given handout - Plan: DG Cervical Spine Complete, DG Cervical Spine Complete  Acute pain of left shoulder -Discussed possible causes including arthritis, muscle strain, impingement -Discussed NSAIDs, heat, rest -Also discussed short course of steroids.  Advised given history of diabetes blood sugars will likely be elevated while taking a steroid.  Pt wishes to wait at this  time on steroids. -Will use Mobic daily -Given handout - Plan: DG Shoulder Left, meloxicam (MOBIC) 7.5 MG tablet  -May need referral to Ortho based on x-ray results  F/u prn  Grier Mitts, MD

## 2018-10-19 NOTE — Patient Instructions (Signed)
Radicular Pain Radicular pain is a type of pain that spreads from your back or neck along a spinal nerve. Spinal nerves are nerves that leave the spinal cord and go to the muscles. Radicular pain is sometimes called radiculopathy, radiculitis, or a pinched nerve. When you have this type of pain, you may also have weakness, numbness, or tingling in the area of your body that is supplied by the nerve. The pain may feel sharp and burning. Depending on which spinal nerve is affected, the pain may occur in the:  Neck area (cervical radicular pain). You may also feel pain, numbness, weakness, or tingling in the arms.  Mid-spine area (thoracic radicular pain). You would feel this pain in the back and chest. This type is rare.  Lower back area (lumbar radicular pain). You would feel this pain as low back pain. You may feel pain, numbness, weakness, or tingling in the buttocks or legs. Sciatica is a type of lumbar radicular pain that shoots down the back of the leg. Radicular pain occurs when one of the spinal nerves becomes irritated or squeezed (compressed). It is often caused by something pushing on a spinal nerve, such as one of the bones of the spine (vertebrae) or one of the round cushions between vertebrae (intervertebral disks). This can result from:  An injury.  Wear and tear or aging of a disk.  The growth of a bone spur that pushes on the nerve. Radicular pain often goes away when you follow instructions from your health care provider for relieving pain at home. Follow these instructions at home: Managing pain      If directed, put ice on the affected area: ? Put ice in a plastic bag. ? Place a towel between your skin and the bag. ? Leave the ice on for 20 minutes, 2-3 times a day.  If directed, apply heat to the affected area as often as told by your health care provider. Use the heat source that your health care provider recommends, such as a moist heat pack or a heating pad. ? Place  a towel between your skin and the heat source. ? Leave the heat on for 20-30 minutes. ? Remove the heat if your skin turns bright red. This is especially important if you are unable to feel pain, heat, or cold. You may have a greater risk of getting burned. Activity   Do not sit or rest in bed for long periods of time.  Try to stay as active as possible. Ask your health care provider what type of exercise or activity is best for you.  Avoid activities that make your pain worse, such as bending and lifting.  Do not lift anything that is heavier than 10 lb (4.5 kg), or the limit that you are told, until your health care provider says that it is safe.  Practice using proper technique when lifting items. Proper lifting technique involves bending your knees and rising up.  Do strength and range-of-motion exercises only as told by your health care provider or physical therapist. General instructions  Take over-the-counter and prescription medicines only as told by your health care provider.  Pay attention to any changes in your symptoms.  Keep all follow-up visits as told by your health care provider. This is important. ? Your health care provider may send you to a physical therapist to help with this pain. Contact a health care provider if:  Your pain and other symptoms get worse.  Your pain medicine is not   helping.  Your pain has not improved after a few weeks of home care.  You have a fever. Get help right away if:  You have severe pain, weakness, or numbness.  You have difficulty with bladder or bowel control. Summary  Radicular pain is a type of pain that spreads from your back or neck along a spinal nerve.  When you have radicular pain, you may also have weakness, numbness, or tingling in the area of your body that is supplied by the nerve.  The pain may feel sharp or burning.  Radicular pain may be treated with ice, heat, medicines, or physical therapy. This  information is not intended to replace advice given to you by your health care provider. Make sure you discuss any questions you have with your health care provider. Document Released: 10/24/2004 Document Revised: 03/31/2018 Document Reviewed: 03/31/2018 Elsevier Interactive Patient Education  2019 [employer]  Shoulder Pain Many things can cause shoulder pain, including:  An injury to the shoulder.  Overuse of the shoulder.  Arthritis. The source of the pain can be:  Inflammation.  An injury to the shoulder joint.  An injury to a tendon, ligament, or bone. Follow these instructions at home: Pay attention to changes in your symptoms. Let your health care provider know about them. Follow these instructions to relieve your pain. If you have a sling:  Wear the sling as told by your health care provider. Remove it only as told by your health care provider.  Loosen the sling if your fingers tingle, become numb, or turn cold and blue.  Keep the sling clean.  If the sling is not waterproof: ? Do not let it get wet. Remove it to shower or bathe.  Move your arm as little as possible, but keep your hand moving to prevent swelling. Managing pain, stiffness, and swelling   If directed, put ice on the painful area: ? Put ice in a plastic bag. ? Place a towel between your skin and the bag. ? Leave the ice on for 20 minutes, 2-3 times per day. Stop applying ice if it does not help with the pain.  Squeeze a soft ball or a foam pad as much as possible. This helps to keep the shoulder from swelling. It also helps to strengthen the arm. General instructions  Take over-the-counter and prescription medicines only as told by your health care provider.  Keep all follow-up visits as told by your health care provider. This is important. Contact a health care provider if:  Your pain gets worse.  Your pain is not relieved with medicines.  New pain develops in your arm, hand, or  fingers. Get help right away if:  Your arm, hand, or fingers: ? Tingle. ? Become numb. ? Become swollen. ? Become painful. ? Turn white or blue. Summary  Shoulder pain can be caused by an injury, overuse, or arthritis.  Pay attention to changes in your symptoms. Let your health care provider know about them.  This condition may be treated with a sling, ice, and pain medicines.  Contact your health care provider if the pain gets worse or new pain develops. Get help right away if your arm, hand, or fingers tingle or become numb, swollen, or painful.  Keep all follow-up visits as told by your health care provider. This is important. This information is not intended to replace advice given to you by your health care provider. Make sure you discuss any questions you have with your health care  provider. Document Released: 06/26/2005 Document Revised: 03/31/2018 Document Reviewed: 03/31/2018 Elsevier Interactive Patient Education  2019 [employer].

## 2018-10-20 ENCOUNTER — Other Ambulatory Visit: Payer: Self-pay

## 2018-10-20 DIAGNOSIS — E0840 Diabetes mellitus due to underlying condition with diabetic neuropathy, unspecified: Secondary | ICD-10-CM

## 2018-10-20 DIAGNOSIS — Z794 Long term (current) use of insulin: Secondary | ICD-10-CM

## 2018-10-20 MED ORDER — INSULIN PEN NEEDLE 31G X 8 MM MISC: 5 refills | Status: DC

## 2018-10-21 NOTE — Telephone Encounter (Signed)
Rx was refilled on 10/19/2018 at his visit

## 2018-10-25 ENCOUNTER — Other Ambulatory Visit: Payer: Self-pay | Admitting: Family Medicine

## 2018-11-01 ENCOUNTER — Other Ambulatory Visit: Payer: Self-pay | Admitting: Family Medicine

## 2018-11-16 ENCOUNTER — Other Ambulatory Visit: Payer: Self-pay | Admitting: Family Medicine

## 2018-11-16 DIAGNOSIS — M25512 Pain in left shoulder: Secondary | ICD-10-CM

## 2018-11-27 ENCOUNTER — Other Ambulatory Visit: Payer: Self-pay | Admitting: Family Medicine

## 2018-11-27 ENCOUNTER — Other Ambulatory Visit: Payer: Self-pay

## 2018-12-07 ENCOUNTER — Telehealth: Payer: Self-pay | Admitting: Internal Medicine

## 2018-12-07 NOTE — Telephone Encounter (Signed)
Returned call to patient.He stated yesterday he had dizziness,light headed.Stated he has been feeling tired for the past 2 months.Stated he did not black out.Also stated he has had numbness in left arm and back of neck.PCP told him could be a pinched nerve.No chest pain. B/P today 121/71 pulse 87.Appointment scheduled with [name] PA 12/10/18 at 2:30 pm.

## 2018-12-07 NOTE — Telephone Encounter (Signed)
New Message   STAT if patient feels like he/she is going to faint   1) Are you dizzy now? Just a touch of lightheadness but he felt worse on yesterday.    2) Do you feel faint or have you passed out? No   3) Do you have any other symptoms? No    4) Have you checked your HR and BP (record if available)? BP 121/71 (3/9) HR 88 (3/9)

## 2018-12-10 ENCOUNTER — Encounter: Payer: Self-pay | Admitting: Cardiology

## 2018-12-10 ENCOUNTER — Ambulatory Visit (INDEPENDENT_AMBULATORY_CARE_PROVIDER_SITE_OTHER): Payer: Medicare Other | Admitting: Cardiology

## 2018-12-10 ENCOUNTER — Other Ambulatory Visit: Payer: Self-pay

## 2018-12-10 VITALS — BP 131/73 | HR 91 | Ht 72.0 in | Wt 245.0 lb

## 2018-12-10 DIAGNOSIS — R42 Dizziness and giddiness: Secondary | ICD-10-CM | POA: Diagnosis not present

## 2018-12-10 DIAGNOSIS — I10 Essential (primary) hypertension: Secondary | ICD-10-CM | POA: Diagnosis not present

## 2018-12-10 DIAGNOSIS — Z794 Long term (current) use of insulin: Secondary | ICD-10-CM

## 2018-12-10 DIAGNOSIS — E1140 Type 2 diabetes mellitus with diabetic neuropathy, unspecified: Secondary | ICD-10-CM

## 2018-12-10 DIAGNOSIS — I480 Paroxysmal atrial fibrillation: Secondary | ICD-10-CM | POA: Diagnosis not present

## 2018-12-10 NOTE — Assessment & Plan Note (Signed)
IDDM with neuropathy 

## 2018-12-10 NOTE — Assessment & Plan Note (Signed)
Remote, no occurrence in > 10 years

## 2018-12-10 NOTE — Progress Notes (Signed)
12/10/2018 Jimmy Carr   [birth year]  [id number]  Primary Physician [name], MD Primary Cardiologist: Dr Debara Pickett  HPI: Patient is a 78 year old male followed by Dr. Debara Pickett with a history of remote PAF.  He is on aspirin alone.  He has insulin-dependent diabetes with neuropathy.  He had a catheterization in 2002 and 2009 that showed minor coronary disease.  He presents to the office today with multiple complaints.  5 days ago on Sunday he says he felt weak and dizzy.  His symptoms gradually improved but he is noticed it off and on since then.  He says he feels like he is "in a fog".  He denies any chills.  He is not traveled, he has not left [facility].  He denies any palpitations or tachycardia.  He denies any diaphoresis or chest pain.  He called the office and was told to come in.  EKG shows sinus rhythm.  His blood pressure is stable.  He does have a low-grade temp of 99.2.  I had the patient wear a mask as did I during the exam.    Current Outpatient Medications  Medication Sig Dispense Refill  . aspirin EC 81 MG tablet Take 81 mg by mouth daily.    . Blood Glucose Monitoring Suppl (ACCU-CHEK AVIVA PLUS) w/Device KIT USED TO CHECK BLOOD SUGAR 3 TIMES DAILY OR PRN. 1 kit 0  . glucose blood (ACCU-CHEK AVIVA) test strip Use as instructed 100 each 12  . hydrochlorothiazide (HYDRODIURIL) 25 MG tablet TAKE 1 TABLET(25 MG) BY MOUTH DAILY 90 tablet 0  . Insulin Glargine (LANTUS SOLOSTAR) 100 UNIT/ML Solostar Pen ADMINISTER 60 UNITS UNDER THE SKIN AT 10:00 PM 15 mL 2  . insulin glargine (LANTUS) 100 UNIT/ML injection Inject 40 Units into the skin at bedtime.    . insulin lispro (HUMALOG KWIKPEN) 100 UNIT/ML KwikPen INJECT 50 UNITS UNDER THE SKIN AT BREAKFAST 35 UNITS WITH LUNCH AND 50 UNITS WITH SUPPER 120 mL 1  . insulin lispro (HUMALOG) 100 UNIT/ML injection Inject 40 Units into the skin 3 (three) times daily before meals.    . Insulin Pen Needle (B-D ULTRAFINE III SHORT PEN) 31G  X 8 MM MISC USE FOUR TIMES DAILY BEFORE MEALS AND AT BEDTIME 200 each 5  . latanoprost (XALATAN) 0.005 % ophthalmic solution 1 drop daily.    Marland Kitchen lisinopril (PRINIVIL,ZESTRIL) 40 MG tablet TAKE 1 TABLET(40 MG) BY MOUTH DAILY 90 tablet 0  . lovastatin (ALTOPREV) 40 MG 24 hr tablet Take 40 mg by mouth at bedtime.    . lovastatin (MEVACOR) 40 MG tablet TAKE 1 TABLET BY MOUTH AT BEDTIME 90 tablet 0  . meloxicam (MOBIC) 7.5 MG tablet TAKE 1 TABLET(7.5 MG) BY MOUTH DAILY 90 tablet 0  . metFORMIN (GLUCOPHAGE) 1000 MG tablet TAKE 1 TABLET BY MOUTH TWICE DAILY WITH FOOD 180 tablet 0  . PRODIGY TWIST TOP LANCETS 28G MISC USE TO TEST THREE TIMES DAILY 100 each 1  . timolol (TIMOPTIC-XR) 0.5 % ophthalmic gel-forming Place 1 drop into both eyes daily.     Current Facility-Administered Medications  Medication Dose Route Frequency Provider Last Rate Last Dose  . 0.9 %  sodium chloride infusion  500 mL Intravenous Continuous Ladene Artist, MD        Allergies  Allergen Reactions  . Cheratussin Ac [Guaifenesin-Codeine] Nausea And Vomiting  . Verapamil     Past Medical History:  Diagnosis Date  . AF (atrial fibrillation) ([place])    2D  ECHO, 03/01/1987 - normal; NUCLEAR STRESS TEST, 04/10/2006 - EF 65%, no ischemia  . Bruit    CAROTID DOPPLER, 09/20/2008 - normal, no evidence of diameter reduction, significant tortuousity or any other vascular abnormality  . Cerebrovascular accident [facility]) 2003   history of small vessal lacunar stroke 2003  . Diabetes mellitus type II   . Glaucoma   . Hyperlipidemia   . Hypertension   . 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 Peripheral neuropathy     Social History   Socioeconomic History  . Marital status: Married    Spouse name: Not on file  . Number of children: Not on file  . Years of education: Not on file  . Highest education level: Not on file  Occupational History  . Not on file  Social Needs  . Financial resource strain: Not on file   . Food insecurity:    Worry: Not on file    Inability: Not on file  . Transportation needs:    Medical: Not on file    Non-medical: Not on file  Tobacco Use  . Smoking status: Never Smoker  . Smokeless tobacco: Never Used  Substance and Sexual Activity  . Alcohol use: No  . Drug use: No  . Sexual activity: Not on file  Lifestyle  . Physical activity:    Days per week: Not on file    Minutes per session: Not on file  . Stress: Not on file  Relationships  . Social connections:    Talks on phone: Not on file    Gets together: Not on file    Attends religious service: Not on file    Active member of club or organization: Not on file    Attends meetings of clubs or organizations: Not on file    Relationship status: Not on file  . Intimate partner violence:    Fear of current or ex partner: Not on file    Emotionally abused: Not on file    Physically abused: Not on file    Forced sexual activity: Not on file  Other Topics Concern  . Not on file  Social History Narrative   Non smoker    On disability     Family History  Problem Relation Age of Onset  . Diabetes Neg Hx   . Colon cancer Neg Hx      Review of Systems: General: negative for chills, fever, night sweats or weight changes.  Cardiovascular: negative for chest pain, dyspnea on exertion, edema, orthopnea, palpitations, paroxysmal nocturnal dyspnea or shortness of breath Dermatological: negative for rash Respiratory: negative for cough or wheezing Urologic: negative for hematuria Abdominal: negative for nausea, vomiting, diarrhea, bright red blood per rectum, melena, or hematemesis Neurologic: negative for visual changes, syncope, or dizziness All other systems reviewed and are otherwise negative except as noted above.    Blood pressure 131/73, pulse 91, height 6' (1.829 m), weight 245 lb (111.1 kg).  General appearance: alert, cooperative, no distress and moderately obese Neck: no carotid bruit and no JVD  Lungs: clear to auscultation bilaterally Heart: regular rate and rhythm Extremities: no edema Skin: Skin color, texture, turgor normal. No rashes or lesions Neurologic: Grossly normal  EKG-NSR, 90, NSST changes  ASSESSMENT AND PLAN:   Dizziness Possible low grade influenza- doubt COVID-19 with low grade Temp, no history of travel.   Essential hypertension Controlled.  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  If he develops a fever > 100 or increasing SOB he should contact his PCP.  If he develops chest pain or palpitations he should contact us for further evaluation.  He should f/u with Dr Lysbeth Penner APP in 1-2 weeks.   [name] PA-C 12/10/2018 4:01 PM

## 2018-12-10 NOTE — Assessment & Plan Note (Addendum)
Possible low grade influenza- doubt COVID-19 with low grade Temp, no history of travel.

## 2018-12-10 NOTE — Patient Instructions (Signed)
Medication Instructions:  Your physician recommends that you continue on your current medications as directed. Please refer to the Current Medication list given to you today. If you need a refill on your cardiac medications before your next appointment, please call your pharmacy.   Lab work: NONE  If you have labs (blood work) drawn today and your tests are completely normal, you will receive your results only by: Marland Kitchen MyChart Message (if you have MyChart) OR . A paper copy in the mail If you have any lab test that is abnormal or we need to change your treatment, we will call you to review the results.  Testing/Procedures: NONE   Follow-Up: At [facility], you and your health needs are our priority.  As part of our continuing mission to provide you with exceptional heart care, we have created designated Provider Care Teams.  These Care Teams include your primary Cardiologist (physician) and Advanced Practice Providers (APPs -  Physician Assistants and Nurse Practitioners) who all work together to provide you with the care you need, when you need it.  Marland Kitchen PLEASE CONTACT OFFICE ON Monday IF YOU ARE NOT BETTER  Any Other Special Instructions Will Be Listed Below (If Applicable).

## 2018-12-10 NOTE — Assessment & Plan Note (Signed)
Controlled.  When he felt bad Sunday he took his B/P and pulse- 353 systolic, pulse 86

## 2018-12-17 ENCOUNTER — Ambulatory Visit: Payer: Medicare Other | Admitting: Physician Assistant

## 2018-12-22 ENCOUNTER — Other Ambulatory Visit: Payer: Self-pay | Admitting: Family Medicine

## 2018-12-22 MED ORDER — PRODIGY TWIST TOP LANCETS 28G MISC: 1 refills | Status: DC

## 2018-12-22 MED ORDER — INSULIN GLARGINE 100 UNIT/ML SOLOSTAR PEN: PEN_INJECTOR | SUBCUTANEOUS | 1 refills | Status: DC

## 2018-12-23 ENCOUNTER — Other Ambulatory Visit: Payer: Self-pay | Admitting: Family Medicine

## 2018-12-23 DIAGNOSIS — I10 Essential (primary) hypertension: Secondary | ICD-10-CM

## 2018-12-25 ENCOUNTER — Other Ambulatory Visit: Payer: Self-pay

## 2018-12-25 DIAGNOSIS — I10 Essential (primary) hypertension: Secondary | ICD-10-CM

## 2018-12-28 ENCOUNTER — Other Ambulatory Visit: Payer: Self-pay | Admitting: Family Medicine

## 2018-12-28 NOTE — Telephone Encounter (Signed)
Rx refilled on 12/25/2018

## 2019-01-27 ENCOUNTER — Other Ambulatory Visit: Payer: Self-pay | Admitting: Family Medicine

## 2019-02-09 ENCOUNTER — Other Ambulatory Visit: Payer: Self-pay | Admitting: Family Medicine

## 2019-02-11 ENCOUNTER — Other Ambulatory Visit: Payer: Self-pay

## 2019-02-11 MED ORDER — INSULIN GLARGINE 100 UNIT/ML SOLOSTAR PEN: PEN_INJECTOR | SUBCUTANEOUS | 2 refills | Status: DC

## 2019-02-15 ENCOUNTER — Telehealth: Payer: Self-pay | Admitting: Family Medicine

## 2019-02-15 ENCOUNTER — Other Ambulatory Visit: Payer: Self-pay

## 2019-02-15 DIAGNOSIS — I10 Essential (primary) hypertension: Secondary | ICD-10-CM

## 2019-02-15 MED ORDER — LISINOPRIL 40 MG PO TABS: ORAL_TABLET | ORAL | 0 refills | Status: DC

## 2019-02-15 NOTE — Telephone Encounter (Signed)
Rx sent to pharmacy for refill

## 2019-02-15 NOTE — Telephone Encounter (Signed)
Copied from [place] [id number]. Topic: Quick Communication - Rx Refill/Question >> Feb 15, 2019  2:09 PM Virl Axe D wrote: Medication: lisinopril (PRINIVIL,ZESTRIL) 40 MG tablet  Has the patient contacted their pharmacy? Yes.   (Agent: If no, request that the patient contact the pharmacy for the refill.) (Agent: If yes, when and what did the pharmacy advise?)  Preferred Pharmacy (with phone number or street name): [id number] - [place], [place] [phone] (Phone) [phone] (Fax)    Agent: Please be advised that RX refills may take up to 3 business days. We ask that you follow-up with your pharmacy.

## 2019-02-28 ENCOUNTER — Other Ambulatory Visit: Payer: Self-pay | Admitting: Family Medicine

## 2019-03-02 ENCOUNTER — Other Ambulatory Visit: Payer: Self-pay | Admitting: Family Medicine

## 2019-03-02 DIAGNOSIS — I10 Essential (primary) hypertension: Secondary | ICD-10-CM

## 2019-03-07 ENCOUNTER — Other Ambulatory Visit: Payer: Self-pay | Admitting: Family Medicine

## 2019-03-16 ENCOUNTER — Other Ambulatory Visit: Payer: Self-pay | Admitting: Family Medicine

## 2019-03-25 ENCOUNTER — Other Ambulatory Visit: Payer: Self-pay | Admitting: Family Medicine

## 2019-03-25 DIAGNOSIS — I10 Essential (primary) hypertension: Secondary | ICD-10-CM

## 2019-03-26 NOTE — Telephone Encounter (Signed)
Rx is not due for refill

## 2019-03-26 NOTE — Telephone Encounter (Signed)
Rx is not due for refill, 2 more refills left

## 2019-04-27 ENCOUNTER — Other Ambulatory Visit: Payer: Self-pay | Admitting: Family Medicine

## 2019-05-03 ENCOUNTER — Other Ambulatory Visit: Payer: Self-pay

## 2019-05-16 ENCOUNTER — Other Ambulatory Visit: Payer: Self-pay | Admitting: Family Medicine

## 2019-05-16 DIAGNOSIS — I10 Essential (primary) hypertension: Secondary | ICD-10-CM

## 2019-05-24 ENCOUNTER — Other Ambulatory Visit: Payer: Self-pay | Admitting: Family Medicine

## 2019-05-24 DIAGNOSIS — I10 Essential (primary) hypertension: Secondary | ICD-10-CM

## 2019-05-25 ENCOUNTER — Other Ambulatory Visit: Payer: Self-pay | Admitting: Family Medicine

## 2019-05-25 DIAGNOSIS — I10 Essential (primary) hypertension: Secondary | ICD-10-CM

## 2019-05-25 NOTE — Telephone Encounter (Signed)
Pt needs an appointment for further refills  

## 2019-05-27 ENCOUNTER — Other Ambulatory Visit: Payer: Self-pay | Admitting: Family Medicine

## 2019-05-31 ENCOUNTER — Other Ambulatory Visit: Payer: Self-pay | Admitting: Family Medicine

## 2019-05-31 ENCOUNTER — Other Ambulatory Visit: Payer: Self-pay

## 2019-05-31 MED ORDER — LANTUS SOLOSTAR 100 UNIT/ML ~~LOC~~ SOPN: PEN_INJECTOR | SUBCUTANEOUS | 1 refills | Status: DC

## 2019-05-31 NOTE — Telephone Encounter (Signed)
Pt needs appointment for further refills 

## 2019-06-08 ENCOUNTER — Other Ambulatory Visit: Payer: Self-pay | Admitting: Family Medicine

## 2019-06-08 NOTE — Telephone Encounter (Signed)
Pt needs appointment for further refills 

## 2019-07-01 ENCOUNTER — Other Ambulatory Visit: Payer: Self-pay

## 2019-07-02 ENCOUNTER — Other Ambulatory Visit: Payer: Self-pay

## 2019-07-02 MED ORDER — PRODIGY NO CODING BLOOD GLUC VI STRP: ORAL_STRIP | 0 refills | Status: DC

## 2019-08-11 ENCOUNTER — Other Ambulatory Visit: Payer: Self-pay

## 2019-08-11 MED ORDER — METFORMIN HCL 1000 MG PO TABS: 1000.0000 mg | ORAL_TABLET | Freq: Two times a day (BID) | ORAL | 0 refills | Status: DC

## 2019-08-16 DIAGNOSIS — R29818 Other symptoms and signs involving the nervous system: Secondary | ICD-10-CM | POA: Diagnosis not present

## 2019-08-16 DIAGNOSIS — N179 Acute kidney failure, unspecified: Secondary | ICD-10-CM | POA: Diagnosis not present

## 2019-08-16 DIAGNOSIS — R9431 Abnormal electrocardiogram [ECG] [EKG]: Secondary | ICD-10-CM | POA: Diagnosis not present

## 2019-08-16 DIAGNOSIS — Z79899 Other long term (current) drug therapy: Secondary | ICD-10-CM | POA: Diagnosis not present

## 2019-08-16 DIAGNOSIS — R531 Weakness: Secondary | ICD-10-CM | POA: Diagnosis not present

## 2019-08-16 DIAGNOSIS — E785 Hyperlipidemia, unspecified: Secondary | ICD-10-CM | POA: Diagnosis not present

## 2019-08-16 DIAGNOSIS — R778 Other specified abnormalities of plasma proteins: Secondary | ICD-10-CM | POA: Diagnosis not present

## 2019-08-16 DIAGNOSIS — R2689 Other abnormalities of gait and mobility: Secondary | ICD-10-CM | POA: Diagnosis not present

## 2019-08-16 DIAGNOSIS — E119 Type 2 diabetes mellitus without complications: Secondary | ICD-10-CM | POA: Diagnosis not present

## 2019-08-16 DIAGNOSIS — Z794 Long term (current) use of insulin: Secondary | ICD-10-CM | POA: Diagnosis not present

## 2019-08-16 DIAGNOSIS — R079 Chest pain, unspecified: Secondary | ICD-10-CM | POA: Diagnosis not present

## 2019-08-16 DIAGNOSIS — I10 Essential (primary) hypertension: Secondary | ICD-10-CM | POA: Diagnosis not present

## 2019-08-16 DIAGNOSIS — G459 Transient cerebral ischemic attack, unspecified: Secondary | ICD-10-CM | POA: Diagnosis not present

## 2019-08-16 DIAGNOSIS — Z7982 Long term (current) use of aspirin: Secondary | ICD-10-CM | POA: Diagnosis not present

## 2019-08-16 DIAGNOSIS — I480 Paroxysmal atrial fibrillation: Secondary | ICD-10-CM | POA: Diagnosis not present

## 2019-08-16 DIAGNOSIS — R42 Dizziness and giddiness: Secondary | ICD-10-CM | POA: Diagnosis not present

## 2019-08-17 DIAGNOSIS — R778 Other specified abnormalities of plasma proteins: Secondary | ICD-10-CM | POA: Diagnosis not present

## 2019-08-17 DIAGNOSIS — G459 Transient cerebral ischemic attack, unspecified: Secondary | ICD-10-CM | POA: Diagnosis not present

## 2019-08-17 DIAGNOSIS — E119 Type 2 diabetes mellitus without complications: Secondary | ICD-10-CM | POA: Diagnosis not present

## 2019-08-17 DIAGNOSIS — I359 Nonrheumatic aortic valve disorder, unspecified: Secondary | ICD-10-CM | POA: Diagnosis not present

## 2019-08-17 DIAGNOSIS — Z794 Long term (current) use of insulin: Secondary | ICD-10-CM | POA: Diagnosis not present

## 2019-08-17 DIAGNOSIS — N179 Acute kidney failure, unspecified: Secondary | ICD-10-CM | POA: Diagnosis not present

## 2019-08-17 DIAGNOSIS — Z8673 Personal history of transient ischemic attack (TIA), and cerebral infarction without residual deficits: Secondary | ICD-10-CM | POA: Diagnosis not present

## 2019-08-17 DIAGNOSIS — E785 Hyperlipidemia, unspecified: Secondary | ICD-10-CM | POA: Diagnosis not present

## 2019-08-17 DIAGNOSIS — I10 Essential (primary) hypertension: Secondary | ICD-10-CM | POA: Diagnosis not present

## 2019-08-17 DIAGNOSIS — I480 Paroxysmal atrial fibrillation: Secondary | ICD-10-CM | POA: Diagnosis not present

## 2019-08-18 ENCOUNTER — Other Ambulatory Visit: Payer: Self-pay | Admitting: Family Medicine

## 2019-08-18 ENCOUNTER — Encounter: Payer: Self-pay | Admitting: Internal Medicine

## 2019-08-18 ENCOUNTER — Ambulatory Visit (INDEPENDENT_AMBULATORY_CARE_PROVIDER_SITE_OTHER): Payer: Medicare Other | Admitting: Internal Medicine

## 2019-08-18 ENCOUNTER — Telehealth: Payer: Self-pay | Admitting: Internal Medicine

## 2019-08-18 ENCOUNTER — Other Ambulatory Visit: Payer: Self-pay

## 2019-08-18 VITALS — BP 134/85 | HR 82 | Ht 72.0 in | Wt 243.2 lb

## 2019-08-18 DIAGNOSIS — Z794 Long term (current) use of insulin: Secondary | ICD-10-CM | POA: Diagnosis not present

## 2019-08-18 DIAGNOSIS — G459 Transient cerebral ischemic attack, unspecified: Secondary | ICD-10-CM

## 2019-08-18 DIAGNOSIS — Z79899 Other long term (current) drug therapy: Secondary | ICD-10-CM

## 2019-08-18 DIAGNOSIS — E7800 Pure hypercholesterolemia, unspecified: Secondary | ICD-10-CM

## 2019-08-18 DIAGNOSIS — I480 Paroxysmal atrial fibrillation: Secondary | ICD-10-CM | POA: Diagnosis not present

## 2019-08-18 DIAGNOSIS — I10 Essential (primary) hypertension: Secondary | ICD-10-CM

## 2019-08-18 DIAGNOSIS — E1140 Type 2 diabetes mellitus with diabetic neuropathy, unspecified: Secondary | ICD-10-CM | POA: Diagnosis not present

## 2019-08-18 DIAGNOSIS — J339 Nasal polyp, unspecified: Secondary | ICD-10-CM

## 2019-08-18 MED ORDER — ACETAMINOPHEN 650 MG RE SUPP
650.00 | RECTAL | Status: DC
Start: ? — End: 2019-08-18

## 2019-08-18 MED ORDER — ALBUTEROL SULFATE (2.5 MG/3ML) 0.083% IN NEBU
2.50 | INHALATION_SOLUTION | RESPIRATORY_TRACT | Status: DC
Start: ? — End: 2019-08-18

## 2019-08-18 MED ORDER — POLYETHYLENE GLYCOL 3350 17 G PO PACK
17.00 | PACK | ORAL | Status: DC
Start: ? — End: 2019-08-18

## 2019-08-18 MED ORDER — GENERIC EXTERNAL MEDICATION
Status: DC
Start: ? — End: 2019-08-18

## 2019-08-18 MED ORDER — APIXABAN 5 MG PO TABS: 5.0000 mg | ORAL_TABLET | Freq: Two times a day (BID) | ORAL | 11 refills | Status: DC

## 2019-08-18 MED ORDER — Medication
Status: DC
Start: ? — End: 2019-08-18

## 2019-08-18 MED ORDER — ACETAMINOPHEN 325 MG PO TABS
650.00 | ORAL_TABLET | ORAL | Status: DC
Start: ? — End: 2019-08-18

## 2019-08-18 MED ORDER — INSULIN LISPRO 100 UNIT/ML ~~LOC~~ SOLN
1.00 | SUBCUTANEOUS | Status: DC
Start: ? — End: 2019-08-18

## 2019-08-18 MED ORDER — WEIGHT LOSS DAILY MULTI PO TABS
5.00 | ORAL_TABLET | ORAL | Status: DC
Start: ? — End: 2019-08-18

## 2019-08-18 MED ORDER — HYDRALAZINE HCL 20 MG/ML IJ SOLN
10.00 | INTRAMUSCULAR | Status: DC
Start: ? — End: 2019-08-18

## 2019-08-18 MED ORDER — ASPIRIN 81 MG PO CHEW
81.00 | CHEWABLE_TABLET | ORAL | Status: DC
Start: ? — End: 2019-08-18

## 2019-08-18 MED ORDER — CHLOROBUTANOL-EUGENOL & APAP
0.40 | Status: DC
Start: ? — End: 2019-08-18

## 2019-08-18 MED ORDER — BARO-CAT PO
20.00 | ORAL | Status: DC
Start: ? — End: 2019-08-18

## 2019-08-18 MED ORDER — Medication
1.00 | Status: DC
Start: ? — End: 2019-08-18

## 2019-08-18 MED ORDER — LABETALOL HCL 5 MG/ML IV SOLN
10.00 | INTRAVENOUS | Status: DC
Start: ? — End: 2019-08-18

## 2019-08-18 MED ORDER — ATORVASTATIN CALCIUM 40 MG PO TABS
40.00 | ORAL_TABLET | ORAL | Status: DC
Start: ? — End: 2019-08-18

## 2019-08-18 MED ORDER — INSULIN LISPRO 100 UNIT/ML ~~LOC~~ SOLN
1.00 | SUBCUTANEOUS | Status: DC
Start: 2019-08-17 — End: 2019-08-18

## 2019-08-18 MED ORDER — SODIUM CHLORIDE 0.9 % IV SOLN
10.00 | INTRAVENOUS | Status: DC
Start: ? — End: 2019-08-18

## 2019-08-18 MED ORDER — TIMOLOL MALEATE 0.5 % OP SOLN
1.00 | OPHTHALMIC | Status: DC
Start: 2019-08-18 — End: 2019-08-18

## 2019-08-18 MED ORDER — ONDANSETRON HCL 4 MG/2ML IJ SOLN
4.00 | INTRAMUSCULAR | Status: DC
Start: ? — End: 2019-08-18

## 2019-08-18 MED ORDER — Medication
1.00 | Status: DC
Start: 2019-08-17 — End: 2019-08-18

## 2019-08-18 NOTE — Telephone Encounter (Signed)
Patient to be seen today @ 2:45pm

## 2019-08-18 NOTE — Progress Notes (Signed)
OFFICE NOTE  Chief Complaint:  Follow-up  Primary Care Physician: [name], MD  HPI:  Jimmy Carr is a 78 year-old male with a history of PAF. His atrial fib actually started in the mid 80s. He was on Coumadin, beta blockers, and Lanoxin, and over the years, we have been able to wean him off his beta blockers. He is still on a low dose of Lanoxin and several years ago, we stopped the Coumadin, and his CHADS score at that time was 2, but with no atrial fibrillation in over 10 years, we stopped the Coumadin. He is on aspirin. He has had no recurrence of any atrial fib in over 15 years. He has no palpitations. No unusual shortness of breath. No chest pain. No exertional dyspnea. Minimal puffiness of his ankles at the end of the day. No dizziness, slurred speech, or blurred vision.  He is diabetic and has been so for over 30 years. His last hemoglobin A1c was 7.2. He now has a peripheral neuropathy that involves his feet, much greater than his hands. He had been tried on Neurontin with no real help. He checks his blood pressure at home. It has been under good control. He denies arthralgias or myalgias. He walks daily for at least 30 minutes, five to seven days a week. With this, he has no claudication, no unusual breathlessness. He has had no angina. He has diffuse T wave inversion on his EKG. He was catheterized in December 2009 that showed minimal coronary disease and normal LV function. He has no real complaints today, other than the increased cost of digoxin. Exactly unclear to me why he is on digoxin, as this medicine is unlikely to keep him out of atrial fibrillation. He does have a high CHADS2 score which is 4, despite in frequent atrial fibrillation. He continues on aspirin.  There are no new complaints of chest pain or worsening shortness of breath.  Jimmy Carr recently saw [name] in the office for what he thought was atypical chest pain. He recommended a nonsteroidal. He is  also reported more recent palpitations. He's been under significant stress recently. Today he called the office several times and spoke with our triage nurse about concern for atrial fibrillation. I did advise him that he may need to come in for an EKG to see if is actually in atrial fibrillation. Ultimately he did decide to do that.  Jimmy Carr returns today and is doing well. He is asymptomatic and has no more palpitations. We discussed to monitor his last visit if his symptoms did not improve but they seem to have improved. Overall he feels like he did well. He denies any chest pain. He reports good blood sugar control with his diabetes.  I saw Jimmy Carr back today in the office for follow-up. Overall he seems to be doing fairly well. He had recent laboratory work which shows an improvement in his lipid profile, however triglycerides remain elevated over 250. This is likely related to poorly controlled diabetes with an A1c of 7.8. He needs to continue to work on weight loss, exercise and reducing carbohydrates. He has not had any recurrent atrial fibrillation in over 15 years. Despite an elevated CHADSVASC score due to his low burden of A. fib he is only on aspirin therapy.  03/20/2017  Jimmy Carr returns today for follow-up. He denies any recurrent palpitations. He occasionally gets some lightheadedness. Recently he's had a feeling of discomfort in his stomach that goes  up to his chest. He says he feels hollow. He gets some lightheadedness and pain in the back of his neck. He describes it not like a headache but more of an aching sensation. He denies any exertional chest pain but has noticed more fatigue and dyspnea. EKG does not show atrial fibrillation today. He denies any hypoglycemia.  04/21/2018  Jimmy Carr returns today for follow-up.  Overall he is without complaints.  When I last saw him he was having some upper mid epigastric to lower central chest pain.  Ordered a stress test and a repeat  monitor for concern for possible A. fib.  He says his symptoms resolved spontaneously and he had neither test performed.  He says he is done well since that without any recurrent palpitations or chest pain.  In general he has had really good control over his blood sugars.  Recent hemoglobin A1c is 7 however on insulin and metformin.  Blood pressure is also well controlled 122/72.  LDL-C less than 70 on lovastatin.  EKG personally reviewed today shows sinus rhythm with nonspecific T wave changes at 76.  08/18/2019  Jimmy Carr is seen today as an add-on.  He was seen 2 days ago at [facility] in [place].  He had presented with symptoms concerning for TIA.  Cerebral imaging was performed including an echo which showed normal EF and negative bubble study.  He was noted on monitoring to have some brief episodes of irregular rhythm concerning for A. fib.  He does have a remote history of A. fib in the past.  It was felt this is the likely cause of his symptoms and the recommendation was to switch him to Eliquis.  In addition, he did have a lipid profile which showed a total cholesterol 114 and LDL 54.  A high potency statin was recommended and it was advised to switch to a atorvastatin from lovastatin.  PMHx:  Past Medical History:  Diagnosis Date  . AF (atrial fibrillation) ([place])    2D ECHO, 03/01/1987 - normal; NUCLEAR STRESS TEST, 04/10/2006 - EF 65%, no ischemia  . Bruit    CAROTID DOPPLER, 09/20/2008 - normal, no evidence of diameter reduction, significant tortuousity or any other vascular abnormality  . Cerebrovascular accident [facility]) 2003   history of small vessal lacunar stroke 2003  . Diabetes mellitus type II   . Glaucoma   . Hyperlipidemia   . Hypertension   . 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 Peripheral neuropathy     Past Surgical History:  Procedure Laterality Date  . ANAL FISSURE REPAIR    . CARDIAC CATHETERIZATION  02/02/2001   Normal LV systolic function,  questionable mitral valve prolapse without mitral regurgitation, no evidence of CAD  . CARDIAC CATHETERIZATION  09/13/2008   Minimal CAD, normal LV systolic function, no evidence of renal artery stenosis, distal aortic disease, or iliac disease    FAMHx:  Family History  Problem Relation Age of Onset  . Diabetes Neg Hx   . Colon cancer Neg Hx     SOCHx:   reports that he has never smoked. He has never used smokeless tobacco. He reports that he does not drink alcohol or use drugs.  ALLERGIES:  Allergies  Allergen Reactions  . Cheratussin Ac [Guaifenesin-Codeine] Nausea And Vomiting  . Verapamil     ROS: Pertinent items noted in HPI and remainder of comprehensive ROS otherwise negative.  HOME MEDS: Current Outpatient Medications  Medication Sig Dispense Refill  . aspirin  EC 81 MG tablet Take 81 mg by mouth daily.    . Blood Glucose Monitoring Suppl (ACCU-CHEK AVIVA PLUS) w/Device KIT USED TO CHECK BLOOD SUGAR 3 TIMES DAILY OR PRN. 1 kit 0  . glucose blood (PRODIGY NO CODING BLOOD GLUC) test strip USE TO TEST BLOOD SUGAR THREE TIMES DAILY AS DIRECTED 300 strip 0  . HUMALOG KWIKPEN 100 UNIT/ML KwikPen INJECT 50 UNITS UNDER THE SKIN AT BREAKFAST, 35 UNITS WITH LUNCH AND 50 UNITS WITH SUPPER 120 mL 0  . hydrochlorothiazide (HYDRODIURIL) 25 MG tablet TAKE 1 TABLET(25 MG) BY MOUTH DAILY 90 tablet 0  . Insulin Glargine (LANTUS SOLOSTAR) 100 UNIT/ML Solostar Pen ADMINISTER 60 UNITS UNDER THE SKIN AT 10:00 PM 15 mL 2  . insulin lispro (HUMALOG) 100 UNIT/ML injection Inject 40 Units into the skin 3 (three) times daily before meals.    . Insulin Pen Needle (B-D ULTRAFINE III SHORT PEN) 31G X 8 MM MISC USE FOUR TIMES DAILY BEFORE MEALS AND AT BEDTIME 200 each 5  . latanoprost (XALATAN) 0.005 % ophthalmic solution 1 drop daily.    Marland Kitchen lisinopril (ZESTRIL) 40 MG tablet TAKE 1 TABLET(40 MG) BY MOUTH DAILY 90 tablet 0  . lovastatin (ALTOPREV) 40 MG 24 hr tablet Take 40 mg by mouth at bedtime.    .  metFORMIN (GLUCOPHAGE) 1000 MG tablet Take 1 tablet (1,000 mg total) by mouth 2 (two) times daily with a meal. 180 tablet 0  . Prodigy Twist Top Lancets 28G MISC TETS THREE TIMES DAILY AS DIRECTED 100 each 1  . timolol (TIMOPTIC-XR) 0.5 % ophthalmic gel-forming Place 1 drop into both eyes daily.     Current Facility-Administered Medications  Medication Dose Route Frequency Provider Last Rate Last Dose  . 0.9 %  sodium chloride infusion  500 mL Intravenous Continuous Ladene Artist, MD        LABS/IMAGING: No results found for this or any previous visit (from the past 48 hour(s)). No results found.  VITALS: BP 134/85   Pulse 82   Ht 6' (1.829 m)   Wt 243 lb 3.2 oz (110.3 kg)   SpO2 98%   BMI 32.98 kg/m   EXAM: General appearance: alert and no distress Neck: no carotid bruit and no JVD Lungs: clear to auscultation bilaterally Heart: regular rate and rhythm, S1, S2 normal, no murmur, click, rub or gallop Abdomen: soft, non-tender; bowel sounds normal; no masses,  no organomegaly Extremities: extremities normal, atraumatic, no cyanosis or edema Pulses: 2+ and symmetric Skin: Skin color, texture, turgor normal. No rashes or lesions Neurologic: Grossly normal Psych: Normal  EKG: Accelerated junctional rhythm at 83, nonspecific T wave changes-personally reviewed  ASSESSMENT: 1. Recent TIA-suspect recurrent A. Fib, CHADSVASC score of 6 2. Remote history of paroxysmal atrial fibrillation 3. Hypertension 4. History of stroke, with right central vision loss 5. Type 2 diabetes on insulin 6. Dyslipidemia 7. Sphenoid mass  PLAN: 1.   Jimmy Carr has symptoms concerning for TIA.  He was noted on imaging to have a recurrent atrial fibrillation.  This is the first documented episode in 15 years.  Based on this and an elevated chads vas score of 6, I agree with recommendations to discontinue aspirin and switch him to Eliquis 5 mg twice daily.  Additionally, he was switched from low to  high potency statin although his LDL is well treated below 70.  This may need to be reduced.  We will plan repeat lipids in probably 6 months.  He will  also have labs at that time including CBC, CMET and A1c.  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  He has had some issues with recurrent sinusitis and difficulty breathing.  I will refer him to Dr. Benjamine Mola for further evaluation. further evaluation.  Follow-up with me in 6 months.  Pixie Casino, MD, [facility], [place] Director of the Advanced Lipid Disorders &  Cardiovascular Risk Reduction Clinic Diplomate of the American Board of Clinical Lipidology Attending Cardiologist  Direct Dial: [phone]  Fax: [phone]  Website:  [email or web address] Osgood Ulice Follett 08/18/2019, 3:21 PM

## 2019-08-18 NOTE — Telephone Encounter (Signed)
Patient was released from hospital. Needs a post hospital f/u with Dr. Debara Pickett. Dr. Debara Pickett has an availability today at 2:45 just needing to verify he can take that slot. He does not want to see an APP. I have him scheduled for 10/08/19 at 10:15. He also is asking for Dr. Lysbeth Penner recommendation for a Dr or practice that specializes in growths inside the nose.

## 2019-08-18 NOTE — Patient Instructions (Signed)
Medication Instructions:  STOP aspirin START eliquis 5mg  twice daily   *If you need a refill on your cardiac medications before your next appointment, please call your pharmacy*  Lab Work: FASTING lab work in 6 months - A1c, lipid panel, CBC, CMET If you have labs (blood work) drawn today and your tests are completely normal, you will receive your results only by: Marland Kitchen MyChart Message (if you have MyChart) OR . A paper copy in the mail If you have any lab test that is abnormal or we need to change your treatment, we will call you to review the results.  Testing/Procedures: NONE  Follow-Up: At [facility], you and your health needs are our priority.  As part of our continuing mission to provide you with exceptional heart care, we have created designated Provider Care Teams.  These Care Teams include your primary Cardiologist (physician) and Advanced Practice Providers (APPs -  Physician Assistants and Nurse Practitioners) who all work together to provide you with the care you need, when you need it.  Your next appointment:   6 month(s)  The format for your next appointment:   In Person  Provider:   K. Mali Hilty, MD  Other Instructions  You have been referred to Dr. Benjamine Mola (ENT)

## 2019-08-18 NOTE — Telephone Encounter (Signed)
Scheduler/operator Leah notified patient can be added on today at 2:45pm

## 2019-08-22 ENCOUNTER — Telehealth: Payer: Self-pay | Admitting: Family Medicine

## 2019-08-22 DIAGNOSIS — I10 Essential (primary) hypertension: Secondary | ICD-10-CM

## 2019-08-23 ENCOUNTER — Other Ambulatory Visit: Payer: Self-pay | Admitting: Family Medicine

## 2019-08-23 NOTE — Telephone Encounter (Signed)
Medication Refill - Medication: Insulin Glargine (LANTUS SOLOSTAR) 100 UNIT/ML Solostar Pen   Has the patient contacted their pharmacy? Yes.   (Agent: If no, request that the patient contact the pharmacy for the refill.) (Agent: If yes, when and what did the pharmacy advise?) Advised to contact PCP  Preferred Pharmacy (with phone number or street name):  [id number] - [place], [address] [phone] (Phone) [phone] (Fax)     Agent: Please be advised that RX refills may take up to 3 business days. We ask that you follow-up with your pharmacy.

## 2019-08-27 ENCOUNTER — Other Ambulatory Visit: Payer: Self-pay | Admitting: Family Medicine

## 2019-08-30 ENCOUNTER — Other Ambulatory Visit: Payer: Self-pay

## 2019-08-30 MED ORDER — PRODIGY TWIST TOP LANCETS 28G MISC: 1 refills | Status: DC

## 2019-09-01 NOTE — Telephone Encounter (Signed)
Pt calling to find out why lovastatin (MEVACOR) 40 MG tablet  isn't being renewed

## 2019-09-01 NOTE — Telephone Encounter (Signed)
See note

## 2019-09-02 NOTE — Telephone Encounter (Signed)
Spoke with pt scheduled for a telephone visit with Dr Volanda Napoleon tomorrow 09/03/2019 at 9 am

## 2019-09-03 ENCOUNTER — Telehealth (INDEPENDENT_AMBULATORY_CARE_PROVIDER_SITE_OTHER): Payer: Medicare Other | Admitting: Family Medicine

## 2019-09-03 DIAGNOSIS — J3489 Other specified disorders of nose and nasal sinuses: Secondary | ICD-10-CM | POA: Diagnosis not present

## 2019-09-03 DIAGNOSIS — Z8673 Personal history of transient ischemic attack (TIA), and cerebral infarction without residual deficits: Secondary | ICD-10-CM | POA: Diagnosis not present

## 2019-09-03 DIAGNOSIS — E7849 Other hyperlipidemia: Secondary | ICD-10-CM

## 2019-09-03 DIAGNOSIS — I10 Essential (primary) hypertension: Secondary | ICD-10-CM | POA: Diagnosis not present

## 2019-09-03 DIAGNOSIS — Z794 Long term (current) use of insulin: Secondary | ICD-10-CM

## 2019-09-03 DIAGNOSIS — E0840 Diabetes mellitus due to underlying condition with diabetic neuropathy, unspecified: Secondary | ICD-10-CM

## 2019-09-03 MED ORDER — LOVASTATIN 40 MG PO TABS: 40.0000 mg | ORAL_TABLET | Freq: Every day | ORAL | 3 refills | Status: DC

## 2019-09-03 NOTE — Progress Notes (Signed)
Virtual Visit via Telephone Note  I connected with Jimmy Carr on 09/03/19 at  9:00 AM EST by telephone and verified that I am speaking with the correct person using two identifiers.   I discussed the limitations, risks, security and privacy concerns of performing an evaluation and management service by telephone and the availability of in person appointments. I also discussed with the patient that there may be a patient responsible charge related to this service. The patient expressed understanding and agreed to proceed.  Location patient: home Location provider: work or home office Participants present for the call: patient, provider Patient did not have a visit in the prior 7 days to address this/these issue(s).   History of Present Illness: Pt is a 78 yo male with pmh sig for HTN, HLD, DM, h/o Afib, and h/o CVA.  Pt felt lightheaded, dizzy, and had numbness in L shoulder.  Went to the [facility] ED in [place] on 11/16-11/17. Dx'd with TIA.  Pt started on lipitor 40 mg, but states he was on lovastatin 40 mg in the past and does not want to change.  Pt had f/u with Cardiology since ED visit.  Pt endorses feeling well, denies return of symptoms.  Pt thinks his numbness may have been from h/o neuropathy.   Pt's fsbs has been 90-140 in am.  May get up to 160 if eats too much dessert.  Pt checking fsbs prior to eating, then adjusts humalog 40-60 units TID with meals.  Takes lantus 50 units at night.    States bp has been controlled.  Pt mentions finding of polyp in his sinus noted on CT obtained in ED.  Unable to see images.  Mention of a "sphenoid mass in sinus" noted in Dr. Lysbeth Penner note 08/18/19.   Observations/Objective: Patient sounds cheerful and well on the phone. I do not appreciate any SOB. Speech and thought processing are grossly intact. Patient reported vitals:  Assessment and Plan: History of TIA (transient ischemic attack) and stroke -Also with history of A.  fib -CHA2DS2-VASc score 6 -Discussed importance of good glycemic, BP, and cholesterol control -Lifestyle modifications encouraged -Continue current medications including Eliquis 5 mg daily  Other hyperlipidemia  -discussed possible reasons for switching from low to high potency statin.  Pt adamant about continuing lovastatin 40 mg. -Unclear if recent labs at ED included lipid panel as unable to view lab results in chart. -Discussed continuing lovastatin 40 mg and obtaining lipid panel at next OFV/CPE in a few months. -Lifestyle modification strongly encouraged. 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  Mass of sphenoid sinus -Noted on CT from 11/16.  Unable to view images or see report.  Findings mentioned by pt and noted in Cardiology note from 08/18/19 -Referral placed to ENT,  Dr, Benjamine Mola, on 08/18/19 by Dr. Debara Pickett -pt encouraged to keep appt  Follow Up Instructions: F/u in the next few months for CPE.  F/u sooner if needed.  I did not refer this patient for an OV in the next 24 hours for this/these issue(s).  I discussed the assessment and treatment plan with the patient. The patient was provided an opportunity to ask questions and all were answered.  The patient agreed with the plan and demonstrated an understanding of the instructions.   The patient was advised to call back or seek an in-person evaluation if the symptoms worsen or if the condition fails to improve as anticipated.  I provided 20 minutes of non-face-to-face time during this encounter.   [name], MD

## 2019-09-08 ENCOUNTER — Other Ambulatory Visit: Payer: Self-pay | Admitting: Family Medicine

## 2019-09-21 ENCOUNTER — Encounter: Payer: Self-pay | Admitting: Internal Medicine

## 2019-09-21 NOTE — Telephone Encounter (Signed)
error 

## 2019-09-22 ENCOUNTER — Other Ambulatory Visit: Payer: Self-pay | Admitting: Family Medicine

## 2019-09-22 MED ORDER — LANTUS SOLOSTAR 100 UNIT/ML ~~LOC~~ SOPN: PEN_INJECTOR | SUBCUTANEOUS | 2 refills | Status: DC

## 2019-09-22 NOTE — Telephone Encounter (Signed)
Medication Refill - Medication: Insulin Glargine (LANTUS SOLOSTAR) 100 UNIT/ML Solostar Pen    Preferred Pharmacy (with phone number or street name):  [id number] - [place], [place] Phone:  [phone]  Fax:  [phone]       Agent: Please be advised that RX refills may take up to 3 business days. We ask that you follow-up with your pharmacy.

## 2019-09-22 NOTE — Telephone Encounter (Signed)
Requested Prescriptions  Pending Prescriptions Disp Refills  . Insulin Glargine (LANTUS SOLOSTAR) 100 UNIT/ML Solostar Pen 15 mL 2    Sig: ADMINISTER 60 UNITS UNDER THE SKIN AT 10 PM     Endocrinology:  Diabetes - Insulins Failed - 09/22/2019 12:24 PM      Failed - HBA1C is between 0 and 7.9 and within 180 days    Hgb A1c MFr Bld  Date Value Ref Range Status  12/31/2017 7.0 (H) 4.6 - 6.5 % Final    Comment:    Glycemic Control Guidelines for People with Diabetes:Non Diabetic:  <6%Goal of Therapy: <7%Additional Action Suggested:  >8%          Passed - Valid encounter within last 6 months    Recent Outpatient Visits          2 weeks ago History of TIA (transient ischemic attack) and stroke   Therapist, music at [place], MD   11 months ago Radiculopathy of arm   Therapist, music at [facility], Langley Adie, MD   1 year ago Essential hypertension   Therapist, music at [facility], Langley Adie, MD   1 year ago Essential hypertension   Therapist, music at [facility], Doretha Sou, MD   2 years ago Essential hypertension   Therapist, music at [facility], Doretha Sou, MD      Future Appointments            In 2 weeks Hilty, Nadean Corwin, MD [facility], [facility]

## 2019-10-08 ENCOUNTER — Ambulatory Visit: Payer: Medicare Other | Admitting: Internal Medicine

## 2019-10-11 ENCOUNTER — Other Ambulatory Visit: Payer: Self-pay | Admitting: Family Medicine

## 2019-10-11 ENCOUNTER — Other Ambulatory Visit: Payer: Self-pay

## 2019-10-11 DIAGNOSIS — E0840 Diabetes mellitus due to underlying condition with diabetic neuropathy, unspecified: Secondary | ICD-10-CM

## 2019-10-11 DIAGNOSIS — Z794 Long term (current) use of insulin: Secondary | ICD-10-CM

## 2019-11-10 ENCOUNTER — Other Ambulatory Visit: Payer: Self-pay | Admitting: Family Medicine

## 2019-11-15 ENCOUNTER — Other Ambulatory Visit: Payer: Self-pay | Admitting: Family Medicine

## 2019-11-19 ENCOUNTER — Other Ambulatory Visit: Payer: Self-pay | Admitting: Family Medicine

## 2019-11-19 DIAGNOSIS — I10 Essential (primary) hypertension: Secondary | ICD-10-CM

## 2019-11-27 ENCOUNTER — Other Ambulatory Visit: Payer: Self-pay | Admitting: Family Medicine

## 2019-11-30 DIAGNOSIS — Z23 Encounter for immunization: Secondary | ICD-10-CM | POA: Diagnosis not present

## 2019-12-03 ENCOUNTER — Other Ambulatory Visit: Payer: Self-pay

## 2019-12-03 MED ORDER — ACCU-CHEK SOFTCLIX LANCETS MISC: 12 refills | Status: DC

## 2019-12-04 ENCOUNTER — Encounter: Payer: Self-pay | Admitting: Emergency Medicine

## 2019-12-04 ENCOUNTER — Other Ambulatory Visit: Payer: Self-pay

## 2019-12-04 ENCOUNTER — Emergency Department (INDEPENDENT_AMBULATORY_CARE_PROVIDER_SITE_OTHER)
Admission: EM | Admit: 2019-12-04 | Discharge: 2019-12-04 | Disposition: A | Discharge status: Home / Self Care | Payer: Medicare Other | Source: Home / Self Care

## 2019-12-04 DIAGNOSIS — M25571 Pain in right ankle and joints of right foot: Secondary | ICD-10-CM | POA: Diagnosis not present

## 2019-12-04 DIAGNOSIS — M25471 Effusion, right ankle: Secondary | ICD-10-CM

## 2019-12-04 DIAGNOSIS — Z8739 Personal history of other diseases of the musculoskeletal system and connective tissue: Secondary | ICD-10-CM

## 2019-12-04 DIAGNOSIS — M25461 Effusion, right knee: Secondary | ICD-10-CM

## 2019-12-04 DIAGNOSIS — M25561 Pain in right knee: Secondary | ICD-10-CM | POA: Diagnosis not present

## 2019-12-04 MED ORDER — HYDROCODONE-ACETAMINOPHEN 5-325 MG PO TABS: 1.0000 | ORAL_TABLET | Freq: Four times a day (QID) | ORAL | 0 refills | Status: DC | PRN

## 2019-12-04 NOTE — Discharge Instructions (Signed)
  Norco/Vicodin (hydrocodone-acetaminophen) is a narcotic pain medication, do not combine these medications with others containing tylenol. While taking, do not drink alcohol, drive, or perform any other activities that requires focus while taking these medications.   Please follow up with your family doctor on Monday or early next week for recheck of your symptoms to make sure the swelling is not getting worse. You may want to discuss precautions to take before getting your 2nd Covid vaccine, in case your symptoms are related.  Because the vaccine is so new, it is hard to know all of the potential side effects.    In the meantime, limit your sodium intake and elevate your leg to help reduce swelling and pain.   Please call 911 or go to the hospital if you develop worsening pain, or chest pain or difficulty breathing.

## 2019-12-04 NOTE — ED Triage Notes (Signed)
C/o pain to R foot/ankle/knee Hx of Gout  1st Covid vaccine this past Tuesday Unable to bear weight on foot due to pain - sharp- pain to touch Denies injury

## 2019-12-04 NOTE — ED Provider Notes (Signed)
[facility]my [facility]    CSN: [id number] Arrival date & time: 12/04/19  4580      History   Chief Complaint Chief Complaint  Patient presents with  . Foot Pain    right - foot/ankle and knee x 3 days    HPI Jimmy Carr is a 79 y.o. male.   HPI Jimmy Carr is a 79 y.o. male presenting to UC with c/o 2-3 days of worsening Right knee, ankle and foot pain with swelling. Pain is sharp, worse with lying flat as well as weightbearing. Hx of gout about 30 years ago. He cannot recall if the pain is the same. He notes he got his first Covid-19 vaccine on Tuesday, 3/2.  He denies other symptoms of fever, chills, body aches, n/v/d.  No redness or warmth of his skin but he does have neuropathy and chronic dry skin of his legs and feet.  He has had leg swelling in the past from too much salt intake.  Denies chest pain or SOB.  No recent injuries.    Past Medical History:  Diagnosis Date  . AF (atrial fibrillation) ([place])    2D ECHO, 03/01/1987 - normal; NUCLEAR STRESS TEST, 04/10/2006 - EF 65%, no ischemia  . Bruit    CAROTID DOPPLER, 09/20/2008 - normal, no evidence of diameter reduction, significant tortuousity or any other vascular abnormality  . Cerebrovascular accident [facility]) 2003   history of small vessal lacunar stroke 2003  . Diabetes mellitus type II   . Glaucoma   . Hyperlipidemia   . Hypertension   . 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 Peripheral neuropathy     Patient Active Problem List   Diagnosis Date Noted  . Dizziness 12/10/2018  . Pure hypercholesterolemia 04/21/2018  . Hx of adenomatous colonic polyps 12/31/2017  . Lightheadedness 03/20/2017  . H/O: stroke 03/20/2017  . Splenomegaly 11/01/2015  . Chronic liver disease 11/01/2015  . Hollenhorst plaque, right eye 05/11/2015  . 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 Nuclear cataract of both eyes  10/04/2014  . Primary open angle glaucoma of both eyes, severe stage 02/14/2014  . Chest pain 11/02/2013  . CAD in native artery 11/02/2013  . PAF (paroxysmal atrial fibrillation) ([place]) 03/16/2013  . Erectile dysfunction 03/11/2011  . SHINGLES 03/28/2010  . URINARY FREQUENCY 11/07/2009  . RUQ PAIN 01/24/2009  . Diabetes mellitus with neuropathy ([place]) 02/24/2007  . Hyperlipidemia 02/24/2007  . PERIPHERAL NEUROPATHY 02/24/2007  . Essential hypertension 02/24/2007  . History of cardiovascular disorder 02/24/2007    Past Surgical History:  Procedure Laterality Date  . ANAL FISSURE REPAIR    . CARDIAC CATHETERIZATION  02/02/2001   Normal LV systolic function, questionable mitral valve prolapse without mitral regurgitation, no evidence of CAD  . CARDIAC CATHETERIZATION  09/13/2008   Minimal CAD, normal LV systolic function, no evidence of renal artery stenosis, distal aortic disease, or iliac disease       Home Medications    Prior to Admission medications   Medication Sig Start Date End Date Taking? Authorizing Provider  aspirin 81 MG chewable tablet Chew by mouth daily.   Yes [provider]  insulin lispro (HUMALOG) 100 UNIT/ML injection Inject 40 Units into the skin 3 (three) times daily before meals.   Yes [provider]  lisinopril (ZESTRIL) 40 MG tablet TAKE 1 TABLET(40 MG) BY MOUTH DAILY 11/19/19  Yes [name], MD  lovastatin (MEVACOR) 40 MG tablet Take 1 tablet (40 mg total) by mouth at bedtime. 09/03/19  Yes [name], MD  metFORMIN (GLUCOPHAGE) 1000 MG tablet TAKE 1 TABLET(1000 MG) BY MOUTH TWICE DAILY WITH A MEAL 11/10/19  Yes [name], MD  Accu-Chek Softclix Lancets lancets Use as instructed 12/03/19   [name], MD  apixaban (ELIQUIS) 5 MG TABS tablet Take 1 tablet (5 mg total) by mouth 2 (two) times daily. 08/18/19   Hilty, Nadean Corwin, MD  Blood Glucose Monitoring Suppl (ACCU-CHEK AVIVA PLUS) w/Device KIT USED TO CHECK BLOOD  SUGAR 3 TIMES DAILY OR PRN. 06/09/18   Marletta Lor, MD  glucose blood (PRODIGY NO CODING BLOOD GLUC) test strip USE TO TEST BLOOD SUGAR THREE TIMES DAILY AS DIRECTED 07/02/19   [name], MD  HUMALOG KWIKPEN 100 UNIT/ML KwikPen INJECT 50 UNITS UNDER THE SKIN AT BREAKFAST, 35 UNITS WITH LUNCH AND 50 UNITS WITH SUPPER 09/08/19   [name], MD  hydrochlorothiazide (HYDRODIURIL) 25 MG tablet TAKE 1 TABLET(25 MG) BY MOUTH DAILY 11/19/19   [name], MD  HYDROcodone-acetaminophen (NORCO/VICODIN) 5-325 MG tablet Take 1 tablet by mouth every 6 (six) hours as needed for moderate pain or severe pain. 12/04/19   Noe Gens, PA-C  Insulin Pen Needle (B-D ULTRAFINE III SHORT PEN) 31G X 8 MM MISC USE FOUR TIMES DAILY BEFORE MEALS AND AT BEDTIME 10/11/19   [name], MD  LANTUS SOLOSTAR 100 UNIT/ML Solostar Pen ADMINISTER 60 UNITS UNDER THE SKIN AT 10 PM 11/29/19   [name], MD  latanoprost (XALATAN) 0.005 % ophthalmic solution 1 drop daily.    [provider]  timolol (TIMOPTIC-XR) 0.5 % ophthalmic gel-forming Place 1 drop into both eyes daily.    [provider]    Family History Family History  Problem Relation Age of Onset  . Diabetes Neg Hx   . Colon cancer Neg Hx     Social History Social History   Tobacco Use  . Smoking status: Never Smoker  . Smokeless tobacco: Never Used  Substance Use Topics  . Alcohol use: No  . Drug use: No     Allergies   Cheratussin ac [guaifenesin-codeine] and Verapamil   Review of Systems Review of Systems  Constitutional: Negative for chills and fever.  Respiratory: Negative for shortness of breath.   Cardiovascular: Positive for leg swelling. Negative for chest pain and palpitations.  Musculoskeletal: Positive for arthralgias, gait problem and joint swelling. Negative for myalgias.  Skin: Negative for color change and wound.     Physical Exam Triage Vital Signs ED Triage Vitals  Enc Vitals  Group     BP 12/04/19 0945 (!) 150/82     Pulse Rate 12/04/19 0945 100     Resp 12/04/19 0945 20     Temp 12/04/19 0945 99.2 F (37.3 C)     Temp Source 12/04/19 0945 Oral     SpO2 12/04/19 0945 96 %     Weight 12/04/19 0949 240 lb (108.9 kg)     Height 12/04/19 0949 6' (1.829 m)     Head Circumference --      Peak Flow --      Pain Score 12/04/19 0946 5     Pain Loc --      Pain Edu? --      Excl. in [place]? --    No data found.  Updated Vital Signs BP (!) 150/82 (BP Location: Left  Arm)   Pulse 100   Temp 99.2 F (37.3 C) (Oral)   Resp 20   Ht 6' (1.829 m)   Wt 240 lb (108.9 kg)   SpO2 96%   BMI 32.55 kg/m   Visual Acuity Right Eye Distance:   Left Eye Distance:   Bilateral Distance:    Right Eye Near:   Left Eye Near:    Bilateral Near:     Physical Exam Vitals and nursing note reviewed.  Constitutional:      General: He is not in acute distress.    Appearance: Normal appearance. He is well-developed. He is not ill-appearing, toxic-appearing or diaphoretic.     Comments: Pt sitting in wheelchair. Appears mildly uncomfortable with Right leg extended. Cooperative during exam.  HENT:     Head: Normocephalic and atraumatic.  Cardiovascular:     Rate and Rhythm: Normal rate and regular rhythm.     Pulses:          Dorsalis pedis pulses are 2+ on the right side.  Pulmonary:     Effort: Pulmonary effort is normal. No respiratory distress.     Breath sounds: Normal breath sounds.  Musculoskeletal:        General: Swelling and tenderness present.     Cervical back: Normal range of motion.     Comments: Right leg: moderate edema to knee, tenderness to lateral and medial aspect. Mild edema to ankle, diffuse tenderness. Limited flexion of knee due to pain. Full ROM ankle. Calf is soft, non-tender.   Skin:    General: Skin is warm and dry.     Capillary Refill: Capillary refill takes Jimmy than 2 seconds.     Comments: Bilateral legs: diffuse dried flaking skin. Skin in  tact. No erythema, warmth or ecchymosis.   Neurological:     Mental Status: He is alert and oriented to person, place, and time.  Psychiatric:        Behavior: Behavior normal.      UC Treatments / Results  Labs (all labs ordered are listed, but only abnormal results are displayed) Labs Reviewed - No data to display  EKG   Radiology No results found.  Procedures Procedures (including critical care time)  Medications Ordered in UC Medications - No data to display  Initial Impression / Assessment and Plan / UC Course  I have reviewed the triage vital signs and the nursing notes.  Pertinent labs & imaging results that were available during my care of the patient were reviewed by me and considered in my medical decision making (see chart for details).     No injury. No indication for urgent imaging at this time. Doubt DVT Suspect gout vs unusual side effect of Covid-19 vaccine given unilateral nature of swelling and pain. Encouraged f/u with PCP  Will tx pain with Vicodin. Considered Colchicine, however, potential interaction with his lisinopril.  Limit sodium intake Discussed symptoms that warrant emergent care in the ED.  AVS provided.  Final Clinical Impressions(s) / UC Diagnoses   Final diagnoses:  History of gout  Acute pain of right knee  Effusion, right knee  Pain and swelling of right ankle     Discharge Instructions      Norco/Vicodin (hydrocodone-acetaminophen) is a narcotic pain medication, do not combine these medications with others containing tylenol. While taking, do not drink alcohol, drive, or perform any other activities that requires focus while taking these medications.   Please follow up with your family doctor on Monday  or early next week for recheck of your symptoms to make sure the swelling is not getting worse. You may want to discuss precautions to take before getting your 2nd Covid vaccine, in case your symptoms are related.  Because  the vaccine is so new, it is hard to know all of the potential side effects.    In the meantime, limit your sodium intake and elevate your leg to help reduce swelling and pain.   Please call 911 or go to the hospital if you develop worsening pain, or chest pain or difficulty breathing.    ED Prescriptions    Medication Sig Dispense Auth. Provider   HYDROcodone-acetaminophen (NORCO/VICODIN) 5-325 MG tablet Take 1 tablet by mouth every 6 (six) hours as needed for moderate pain or severe pain. 12 tablet Noe Gens, [place]     I have reviewed the PDMP during this encounter.   Noe Gens, [place] 12/04/19 1209

## 2019-12-08 ENCOUNTER — Other Ambulatory Visit: Payer: Self-pay | Admitting: Family Medicine

## 2019-12-29 DIAGNOSIS — Z23 Encounter for immunization: Secondary | ICD-10-CM | POA: Diagnosis not present

## 2020-01-21 ENCOUNTER — Other Ambulatory Visit: Payer: Self-pay

## 2020-01-24 ENCOUNTER — Other Ambulatory Visit: Payer: Self-pay

## 2020-01-24 ENCOUNTER — Ambulatory Visit (INDEPENDENT_AMBULATORY_CARE_PROVIDER_SITE_OTHER): Payer: Medicare Other | Admitting: Family Medicine

## 2020-01-24 ENCOUNTER — Encounter: Payer: Self-pay | Admitting: Family Medicine

## 2020-01-24 VITALS — BP 128/70 | HR 88 | Temp 97.5°F | Wt 241.0 lb

## 2020-01-24 DIAGNOSIS — L570 Actinic keratosis: Secondary | ICD-10-CM

## 2020-01-24 DIAGNOSIS — J392 Other diseases of pharynx: Secondary | ICD-10-CM | POA: Diagnosis not present

## 2020-01-24 DIAGNOSIS — L821 Other seborrheic keratosis: Secondary | ICD-10-CM

## 2020-01-24 NOTE — Patient Instructions (Signed)
Actinic Keratosis An actinic keratosis is a precancerous growth on the skin. If there is more than one growth, the condition is called actinic keratoses. Actinic keratoses appear most often on areas of skin that get a lot of sun exposure, including the scalp, face, ears, lips, upper back, forearms, and the backs of the hands. If left untreated, these growths may develop into a skin cancer called squamous cell carcinoma. It is important to have all these growths checked by a health care provider to determine the best treatment approach. What are the causes? Actinic keratoses are caused by getting too much ultraviolet (UV) radiation from the sun or other UV light sources. What increases the risk? You are more likely to develop this condition if you:  Have light-colored skin and blue eyes.  Have blond or red hair.  Spend a lot of time in the sun.  Do not protect your skin from the sun when outdoors.  Are an older person. The risk of developing an actinic keratosis increases with age. What are the signs or symptoms? Actinic keratoses feel like scaly, rough spots of skin. Symptoms of this condition include growths that may:  Be as small as a pinhead or as big as a quarter.  Itch, hurt, or feel sensitive.  Be skin-colored, light tan, dark tan, pink, or a combination of any of these colors. In most cases, the growths become red.  Have a small piece of pink or gray skin (skin tag) growing from them. It may be easier to notice actinic keratoses by feeling them, rather than seeing them. Sometimes, actinic keratoses disappear, but many reappear a few days to a few weeks later. How is this diagnosed? This condition is usually diagnosed with a physical exam.  A tissue sample may be removed from the actinic keratosis and examined under a microscope (biopsy). How is this treated? If needed, this condition may be treated by:  Scraping off the actinic keratosis (curettage).  Freezing the actinic  keratosis with liquid nitrogen (cryosurgery). This causes the growth to eventually fall off the skin.  Applying medicated creams or gels to destroy the cells in the growth.  Applying chemicals to the actinic keratosis to make the outer layers of skin peel off (chemical peel).  Using photodynamic therapy. In this procedure, medicated cream is applied to the actinic keratosis. This cream increases your skin's sensitivity to light. Then, a strong light is aimed at the actinic keratosis to destroy cells in the growth. Follow these instructions at home: Skin care  Apply cool, wet cloths (cool compresses) to the affected areas.  Do not scratch your skin.  Check your skin regularly for any growths, especially growths that: ? Start to itch or bleed. ? Change in size, shape, or color. Caring for the treated area  Keep the treated area clean and dry as told by your health care provider.  Do not apply any medicine, cream, or lotion to the treated area unless your health care provider tells you to do that.  Do not pick at blisters or try to break them open. This can cause infection and scarring.  If you have red or irritated skin after treatment, follow instructions from your health care provider about how to take care of the treated area. Make sure you: ? Wash your hands with soap and water before you change your bandage (dressing). If soap and water are not available, use hand sanitizer. ? Change your dressing as told by your health care provider.  If  you have red or irritated skin after treatment, check your treated area every day for signs of infection. Check for: ? Redness, swelling, or pain. ? Fluid or blood. ? Warmth. ? Pus or a bad smell. General instructions  Take or apply over-the-counter and prescription medicines only as told by your health care provider.  Return to your normal activities as told by your health care provider. Ask your health care provider what activities are  safe for you.  Have a skin exam done every year by a health care provider who is a skin specialist (dermatologist).  Keep all follow-up visits as told by your health care provider. This is important. Lifestyle  Do not use any products that contain nicotine or tobacco, such as cigarettes and e-cigarettes. If you need help quitting, ask your health care provider.  Take steps to protect your skin from the sun. ? Try to avoid the sun between 10:00 a.m. and 4:00 p.m. This is when the UV light is the strongest. ? Use a sunscreen or sunblock with SPF 30 (sun protection factor 30) or greater. ? Apply sunscreen before you are exposed to sunlight and reapply as often as directed by the instructions on the sunscreen container. ? Always wear sunglasses that have UV protection, and always wear a hat and clothing to protect your skin from sunlight. ? When possible, avoid medicines that increase your sensitivity to sunlight. ? Do not use tanning beds or other indoor tanning devices. Contact a health care provider if:  You notice any changes or new growths on your skin.  You have swelling, pain, or more redness around your treated area.  You have fluid or blood coming from your treated area.  Your treated area feels warm to the touch.  You have pus or a bad smell coming from your treated area.  You have a fever.  You have a blister that becomes large and painful. Summary  An actinic keratosis is a precancerous growth on the skin. If there is more than one growth, the condition is called actinic keratoses. In some cases, if left untreated, these growths can develop into skin cancer.  Check your skin regularly for any growths, especially growths that start to itch or bleed, or change in size, shape, or color.  Take steps to protect your skin from the sun.  Contact a health care provider if you notice any changes or new growths on your skin.  Keep all follow-up visits as told by your health  care provider. This is important. This information is not intended to replace advice given to you by your health care provider. Make sure you discuss any questions you have with your health care provider. Document Revised: 01/27/2018 Document Reviewed: 01/27/2018 Elsevier Patient Education  [place].  Seborrheic Keratosis A seborrheic keratosis is a common, noncancerous (benign) skin growth. These growths are velvety, waxy, rough, tan, brown, or black spots that appear on the skin. These skin growths can be flat or raised, and scaly. What are the causes? The cause of this condition is not known. What increases the risk? You are more likely to develop this condition if you:  Have a family history of seborrheic keratosis.  Are 50 or older.  Are pregnant.  Have had estrogen replacement therapy. What are the signs or symptoms? Symptoms of this condition include growths on the face, chest, shoulders, back, or other areas. These growths:  Are usually painless, but may become irritated and itchy.  Can be yellow, brown,  black, or other colors.  Are slightly raised or have a flat surface.  Are sometimes rough or wart-like in texture.  Are often velvety or waxy on the surface.  Are round or oval-shaped.  Often occur in groups, but may occur as a single growth. How is this diagnosed? This condition is diagnosed with a medical history and physical exam.  A sample of the growth may be tested (skin biopsy).  You may need to see a skin specialist (dermatologist). How is this treated? Treatment is not usually needed for this condition, unless the growths are irritated or bleed often.  You may also choose to have the growths removed if you do not like their appearance. ? Most commonly, these growths are treated with a procedure in which liquid nitrogen is applied to "freeze" off the growth (cryosurgery). ? They may also be burned off with electricity (electrocautery) or removed  by scraping (curettage). Follow these instructions at home:  Watch your growth for any changes.  Keep all follow-up visits as told by your health care provider. This is important.  Do not scratch or pick at the growth or growths. This can cause them to become irritated or infected. Contact a health care provider if:  You suddenly have many new growths.  Your growth bleeds, itches, or hurts.  Your growth suddenly becomes larger or changes color. Summary  A seborrheic keratosis is a common, noncancerous (benign) skin growth.  Treatment is not usually needed for this condition, unless the growths are irritated or bleed often.  Watch your growth for any changes.  Contact a health care provider if you suddenly have many new growths or your growth suddenly becomes larger or changes color.  Keep all follow-up visits as told by your health care provider. This is important. This information is not intended to replace advice given to you by your health care provider. Make sure you discuss any questions you have with your health care provider. Document Revised: 01/29/2018 Document Reviewed: 01/29/2018 Elsevier Patient Education  [place].

## 2020-01-24 NOTE — Progress Notes (Signed)
Subjective:    Patient ID: Jimmy Carr, male    DOB: [birth year], 79 y.o.   MRN: [id number]  No chief complaint on file.   HPI Patient was seen today for acute concern.  Pt notes irritated throat and bumps on the back of his tongue.  Symptoms started last wk.  Pt denies sore throat, fever, cough, nasal drainage.  Thought symptoms may have been from eating grapes or from possible strep infection.    Pt completed 2 doses of COVID vaccine 2 wks ago.  Past Medical History:  Diagnosis Date  . AF (atrial fibrillation) ([place])    2D ECHO, 03/01/1987 - normal; NUCLEAR STRESS TEST, 04/10/2006 - EF 65%, no ischemia  . Bruit    CAROTID DOPPLER, 09/20/2008 - normal, no evidence of diameter reduction, significant tortuousity or any other vascular abnormality  . Cerebrovascular accident [facility]) 2003   history of small vessal lacunar stroke 2003  . Diabetes mellitus type II   . Glaucoma   . Hyperlipidemia   . Hypertension   . 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 Peripheral neuropathy     Allergies  Allergen Reactions  . Cheratussin Ac [Guaifenesin-Codeine] Nausea And Vomiting  . Verapamil     ROS General: Denies fever, chills, night sweats, changes in weight, changes in appetite HEENT: Denies headaches, ear pain, changes in vision, rhinorrhea, sore throat  +throat irritation CV: Denies CP, palpitations, SOB, orthopnea Pulm: Denies SOB, cough, wheezing GI: Denies abdominal pain, nausea, vomiting, diarrhea, constipation GU: Denies dysuria, hematuria, frequency, vaginal discharge Msk: Denies muscle cramps, joint pains Neuro: Denies weakness, numbness, tingling Skin: Denies rashes, bruising Psych: Denies depression, anxiety, hallucinations     Objective:    Blood pressure 128/70, pulse 88, temperature (!) 97.5 F (36.4 C), temperature source Temporal, weight 241 lb (109.3 kg), SpO2 97 %.  Gen. Pleasant, well-nourished, in no distress, normal affect   HEENT: [place]/AT,  face symmetric, no scleral icterus, PERRLA, EOMI, nares patent without drainage, tongue normal in appearance.  Normal appearing vallate papillae noted on posterior tongue, pharynx without erythema, ulcer, or exudate.  TMs normal bilaterally.  No cervical lymphadenopathy. Lungs: no accessory muscle use Cardiovascular: RRR, no peripheral edema Neuro:  A&Ox3, CN II-XII intact, normal gait Skin:  Warm, dry, and intact.  Several actinic keratosis and seborrheic keratosis lesions noted on mid, face, upper extremities.   Wt Readings from Last 3 Encounters:  01/24/20 241 lb (109.3 kg)  12/04/19 240 lb (108.9 kg)  08/18/19 243 lb 3.2 oz (110.3 kg)    Lab Results  Component Value Date   WBC 8.4 12/31/2017   HGB 16.3 12/31/2017   HCT 48.2 12/31/2017   PLT 176.0 12/31/2017   GLUCOSE 117 (H) 12/31/2017   CHOL 126 12/31/2017   TRIG 124.0 12/31/2017   HDL 38.20 (L) 12/31/2017   LDLDIRECT 67.0 05/16/2015   LDLCALC 63 12/31/2017   ALT 27 12/31/2017   AST 18 12/31/2017   NA 140 12/31/2017   K 3.8 12/31/2017   CL 99 12/31/2017   CREATININE 1.19 12/31/2017   BUN 19 12/31/2017   CO2 31 12/31/2017   TSH 1.86 12/31/2017   PSA 1.70 05/04/2014   HGBA1C 7.0 (H) 12/31/2017   MICROALBUR 2.0 (H) 12/31/2017    Assessment/Plan:  Throat irritation -Improving -Discussed possible causes of irritation -Discussed supportive care including gargling with warm salt water or Chloraseptic spray -Discussed normal-appearing vallate papillae on posterior tongue. -We will continue to monitor for any changes, ulcers,  or further irritation in throat and pharynx.  Seborrheic keratosis -Discussed follow-up with dermatology for removal -Given handout  Actinic keratosis -Discussed follow-up with dermatology for removal -Given handout  F/u prn  Grier Mitts, MD

## 2020-02-09 ENCOUNTER — Other Ambulatory Visit: Payer: Self-pay | Admitting: Family Medicine

## 2020-02-16 ENCOUNTER — Other Ambulatory Visit: Payer: Self-pay | Admitting: Family Medicine

## 2020-02-16 DIAGNOSIS — I10 Essential (primary) hypertension: Secondary | ICD-10-CM

## 2020-02-21 DIAGNOSIS — B078 Other viral warts: Secondary | ICD-10-CM | POA: Diagnosis not present

## 2020-02-21 DIAGNOSIS — D2271 Melanocytic nevi of right lower limb, including hip: Secondary | ICD-10-CM | POA: Diagnosis not present

## 2020-02-21 DIAGNOSIS — L858 Other specified epidermal thickening: Secondary | ICD-10-CM | POA: Diagnosis not present

## 2020-02-21 DIAGNOSIS — D0462 Carcinoma in situ of skin of left upper limb, including shoulder: Secondary | ICD-10-CM | POA: Diagnosis not present

## 2020-02-21 DIAGNOSIS — D044 Carcinoma in situ of skin of scalp and neck: Secondary | ICD-10-CM | POA: Diagnosis not present

## 2020-02-21 DIAGNOSIS — L570 Actinic keratosis: Secondary | ICD-10-CM | POA: Diagnosis not present

## 2020-02-21 DIAGNOSIS — D225 Melanocytic nevi of trunk: Secondary | ICD-10-CM | POA: Diagnosis not present

## 2020-02-21 DIAGNOSIS — L821 Other seborrheic keratosis: Secondary | ICD-10-CM | POA: Diagnosis not present

## 2020-02-26 ENCOUNTER — Other Ambulatory Visit: Payer: Self-pay | Admitting: Family Medicine

## 2020-03-02 ENCOUNTER — Ambulatory Visit (INDEPENDENT_AMBULATORY_CARE_PROVIDER_SITE_OTHER): Payer: Medicare Other | Admitting: Family Medicine

## 2020-03-02 ENCOUNTER — Other Ambulatory Visit: Payer: Self-pay

## 2020-03-02 ENCOUNTER — Encounter: Payer: Self-pay | Admitting: Family Medicine

## 2020-03-02 VITALS — BP 110/62 | HR 92 | Temp 97.8°F | Ht 72.0 in | Wt 242.0 lb

## 2020-03-02 DIAGNOSIS — H6122 Impacted cerumen, left ear: Secondary | ICD-10-CM | POA: Diagnosis not present

## 2020-03-02 DIAGNOSIS — E1140 Type 2 diabetes mellitus with diabetic neuropathy, unspecified: Secondary | ICD-10-CM

## 2020-03-02 DIAGNOSIS — R600 Localized edema: Secondary | ICD-10-CM | POA: Diagnosis not present

## 2020-03-02 DIAGNOSIS — Z794 Long term (current) use of insulin: Secondary | ICD-10-CM | POA: Diagnosis not present

## 2020-03-02 DIAGNOSIS — Z0000 Encounter for general adult medical examination without abnormal findings: Secondary | ICD-10-CM

## 2020-03-02 DIAGNOSIS — I10 Essential (primary) hypertension: Secondary | ICD-10-CM

## 2020-03-02 DIAGNOSIS — E782 Mixed hyperlipidemia: Secondary | ICD-10-CM

## 2020-03-02 DIAGNOSIS — Z125 Encounter for screening for malignant neoplasm of prostate: Secondary | ICD-10-CM | POA: Diagnosis not present

## 2020-03-02 LAB — COMPREHENSIVE METABOLIC PANEL
ALT: 31 U/L (ref 0–53)
AST: 19 U/L (ref 0–37)
Albumin: 4.3 g/dL (ref 3.5–5.2)
Alkaline Phosphatase: 65 U/L (ref 39–117)
BUN: 23 mg/dL (ref 6–23)
CO2: 27 mEq/L (ref 19–32)
Calcium: 9.5 mg/dL (ref 8.4–10.5)
Chloride: 100 mEq/L (ref 96–112)
Creatinine, Ser: 1.28 mg/dL (ref 0.40–1.50)
GFR: 54.5 mL/min — ABNORMAL LOW (ref >60.00)
Glucose, Bld: 158 mg/dL — ABNORMAL HIGH (ref 70–99)
Potassium: 3.9 mEq/L (ref 3.5–5.1)
Sodium: 139 mEq/L (ref 135–145)
Total Bilirubin: 0.9 mg/dL (ref 0.2–1.2)
Total Protein: 6.7 g/dL (ref 6.0–8.3)

## 2020-03-02 LAB — CBC
HCT: 46.7 % (ref 39.0–52.0)
Hemoglobin: 15.8 g/dL (ref 13.0–17.0)
MCHC: 33.8 g/dL (ref 30.0–36.0)
MCV: 81.9 fl (ref 78.0–100.0)
Platelets: 171 10*3/uL (ref 150.0–400.0)
RBC: 5.7 Mil/uL (ref 4.22–5.81)
RDW: 15.1 % (ref 11.5–15.5)
WBC: 8.2 10*3/uL (ref 4.0–10.5)

## 2020-03-02 LAB — BRAIN NATRIURETIC PEPTIDE: Pro B Natriuretic peptide (BNP): 26 pg/mL (ref 0.0–100.0)

## 2020-03-02 LAB — LIPID PANEL
Cholesterol: 136 mg/dL (ref 0–200)
HDL: 36 mg/dL — ABNORMAL LOW (ref >39.00)
LDL Cholesterol: 70 mg/dL (ref 0–99)
NonHDL: 99.56
Total CHOL/HDL Ratio: 4
Triglycerides: 147 mg/dL (ref 0.0–149.0)
VLDL: 29.4 mg/dL (ref 0.0–40.0)

## 2020-03-02 LAB — PSA, MEDICARE: PSA: 1.92 ng/ml (ref 0.10–4.00)

## 2020-03-02 LAB — HEMOGLOBIN A1C: Hgb A1c MFr Bld: 7.9 % — ABNORMAL HIGH (ref 4.6–6.5)

## 2020-03-02 NOTE — Progress Notes (Signed)
Annual Wellness Visit  Patient: Jimmy Carr, Male    DOB: [birth year], 79 y.o.   MRN: [id number] Visit Date: 03/02/2020  Today's Provider: [name], MD  Subjective:   No chief complaint on file.  Jimmy Carr is a 79 y.o. male who presents today for his Annual Wellness Visit.  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  Has "knots" on stomach from injections.  Rotating injection sites.  fsbs typically 90-150s.  Endorses regular vision screening. Followed by cardiology for history of TIA, CAD. On Eliquis HTN well controlled on HCTZ  25 mg.  Past Surgical History:  Procedure Laterality Date  . ANAL FISSURE REPAIR    . CARDIAC CATHETERIZATION  02/02/2001   Normal LV systolic function, questionable mitral valve prolapse without mitral regurgitation, no evidence of CAD  . CARDIAC CATHETERIZATION  09/13/2008   Minimal CAD, normal LV systolic function, no evidence of renal artery stenosis, distal aortic disease, or iliac disease   Family History  Problem Relation Age of Onset  . Diabetes Neg Hx   . Colon cancer Neg Hx    Allergies  Allergen Reactions  . Cheratussin Ac [Guaifenesin-Codeine] Nausea And Vomiting  . Verapamil        Medications: Outpatient Medications Prior to Visit  Medication Sig  . Accu-Chek Softclix Lancets lancets Use as instructed  . apixaban (ELIQUIS) 5 MG TABS tablet Take 1 tablet (5 mg total) by mouth 2 (two) times daily.  Marland Kitchen aspirin 81 MG chewable tablet Chew by mouth daily.  . Blood Glucose Monitoring Suppl (ACCU-CHEK AVIVA PLUS) w/Device KIT USED TO CHECK BLOOD SUGAR 3 TIMES DAILY OR PRN.  Marland Kitchen glucose blood (PRODIGY NO CODING BLOOD GLUC) test strip USE THREE TIMES DAILY AS DIRECTED  . HUMALOG KWIKPEN 100 UNIT/ML KwikPen INJECT 50 UNITS UNDER THE SKIN AT BREAKFAST, 35 UNITS WITH LUNCH AND 50 UNITS WITH SUPPER  . hydrochlorothiazide (HYDRODIURIL) 25 MG tablet TAKE 1 TABLET(25 MG) BY MOUTH DAILY  .  HYDROcodone-acetaminophen (NORCO/VICODIN) 5-325 MG tablet Take 1 tablet by mouth every 6 (six) hours as needed for moderate pain or severe pain.  Marland Kitchen insulin lispro (HUMALOG) 100 UNIT/ML injection Inject 40 Units into the skin 3 (three) times daily before meals.  . Insulin Pen Needle (B-D ULTRAFINE III SHORT PEN) 31G X 8 MM MISC USE FOUR TIMES DAILY BEFORE MEALS AND AT BEDTIME  . LANTUS SOLOSTAR 100 UNIT/ML Solostar Pen ADMINISTER 60 UNITS UNDER THE SKIN AT 10 PM  . latanoprost (XALATAN) 0.005 % ophthalmic solution 1 drop daily.  Marland Kitchen lisinopril (ZESTRIL) 40 MG tablet TAKE 1 TABLET(40 MG) BY MOUTH DAILY  . lovastatin (MEVACOR) 40 MG tablet Take 1 tablet (40 mg total) by mouth at bedtime.  . metFORMIN (GLUCOPHAGE) 1000 MG tablet TAKE 1 TABLET(1000 MG) BY MOUTH TWICE DAILY WITH A MEAL  . timolol (TIMOPTIC-XR) 0.5 % ophthalmic gel-forming Place 1 drop into both eyes daily.   Facility-Administered Medications Prior to Visit  Medication Dose Route Frequency Provider  . 0.9 %  sodium chloride infusion  500 mL Intravenous Continuous Ladene Artist, MD    Allergies  Allergen Reactions  . Cheratussin Ac [Guaifenesin-Codeine] Nausea And Vomiting  . Verapamil     Patient Care Team: [name], MD as PCP - General (Family Medicine) Harriett Sine, MD as Consulting Physician (Dermatology)  Review of Systems   General: Denies fever, chills, night sweats, changes in weight, changes in appetite HEENT: Denies headaches, ear pain,  changes in vision, rhinorrhea, sore throat CV: Denies CP, palpitations, SOB, orthopnea Pulm: Denies SOB, cough, wheezing GI: Denies abdominal pain, nausea, vomiting, diarrhea, constipation GU: Denies dysuria, hematuria, frequency, vaginal discharge Msk: Denies muscle cramps, joint pains Neuro: Denies weakness, numbness, tingling Skin: Denies rashes, bruising Psych: Denies depression, anxiety, hallucinations  Objective:    Vitals: BP 110/62 (BP Location: Left Arm,  Patient Position: Sitting, Cuff Size: Large)   Pulse 92   Temp 97.8 F (36.6 C) (Temporal)   Ht 6' (1.829 m)   Wt 242 lb (109.8 kg)   SpO2 97%   BMI 32.82 kg/m    Physical Exam Gen. Pleasant, well developed, well-nourished, in NAD HEENT - /AT, PERRL, EOMI, no scleral icterus, no nasal drainage, pharynx without erythema or exudate.  External ears and right canal and right TM normal.  Left canal occluded with cerumen.  Left TM normal after irrigation. Neck: No JVD, no thyromegaly, no carotid bruits Lungs: no use of accessory muscles, CTAB, no wheezes, rales or rhonchi Cardiovascular: RRR, No r/g/m, trace bilateral LE edema Abdomen: BS present, soft, nontender,nondistended, no hepatosplenomegaly Musculoskeletal: No deformities, moves all four extremities, no cyanosis or clubbing, normal tone Neuro:  A&Ox3, CN II-XII intact, normal gait Skin:  Warm, dry, intact, no lesions Psych: normal affect, mood appropriate  Diabetic Foot Exam - Simple   Simple Foot Form Diabetic Foot exam was performed with the following findings: Yes 03/02/2020  8:45 PM  Visual Inspection See comments: Yes Sensation Testing See comments: Yes Pulse Check See comments: Yes Comments Calluses on dorsal surface of b/l feet.  Thickened toenails.  Healing fissure between 2nd and 3rd digit of L foot.  Diminished R DP pulse.  Present R PT and L DP and PT pulses.  Decreased sensation to vibratory and touch with monofilament.     Most recent functional status assessment: No flowsheet data found.  Most recent fall risk assessment: Fall Risk  03/02/2020  Falls in the past year? 0  Comment -  Number falls in past yr: -  Comment -     Most recent depression screenings: PHQ 2/9 Scores 03/02/2020 12/31/2017  PHQ - 2 Score 0 0    Most recent cognitive screening: A&O x3.  Denies trouble with memory.     Assessment & Plan:      Annual wellness visit done today including the all of the following: Reviewed patient's  Family Medical History Reviewed and updated list of patient's medical providers Assessment of cognitive impairment was done Assessed patient's functional ability Established a written schedule for health screening [place] Completed and Reviewed  Exercise Activities and Dietary recommendations Goals   None      There is no immunization history on file for this patient.  Health Maintenance  Topic Date Due  . COVID-19 Vaccine (1) Never done  . OPHTHALMOLOGY EXAM  05/02/2016  . HEMOGLOBIN A1C  07/02/2018  . FOOT EXAM  01/01/2019  . PNA vac Low Risk Adult (1 of 2 - PCV13) 05/24/2020 (Originally 03/12/2008)  . TETANUS/TDAP  05/24/2025 (Originally 03/12/1962)  . INFLUENZA VACCINE  04/30/2020  . COLONOSCOPY  12/04/2021     Discussed health benefits of physical activity, and encouraged him to engage in regular exercise appropriate for his age and condition.  Patient has advanced directives in place.  Essential hypertension  - Plan: Comprehensive metabolic panel  Impacted cerumen of left ear -Consent obtained.  Left ear irrigated.  Patient tolerated procedure well. -Consider Debrox -Given handout  Type 2 diabetes mellitus with diabetic neuropathy, with long-term current use of insulin (HCC)  -continue humalog 50 u at breakfast 35 units at lunch 50 units at dinner, and lantus 60 units qhs - Plan: Hemoglobin A1c, Ambulatory referral to Podiatry  Mixed hyperlipidemia  - Plan: Lipid panel  Bilateral lower extremity edema  - Plan: CBC (no diff), Brain Natriuretic Peptide, Comprehensive metabolic panel  Screening for prostate cancer  - Plan: PSA, Medicare  [name], MD

## 2020-03-02 NOTE — Patient Instructions (Addendum)
Preventive Care 89 Years and Older, Male Preventive care refers to lifestyle choices and visits with your health care provider that can promote health and wellness. This includes:  A yearly physical exam. This is also called an annual well check.  Regular dental and eye exams.  Immunizations.  Screening for certain conditions.  Healthy lifestyle choices, such as diet and exercise. What can I expect for my preventive care visit? Physical exam Your health care provider will check:  Height and weight. These may be used to calculate body mass index (BMI), which is a measurement that tells if you are at a healthy weight.  Heart rate and blood pressure.  Your skin for abnormal spots. Counseling Your health care provider may ask you questions about:  Alcohol, tobacco, and drug use.  Emotional well-being.  Home and relationship well-being.  Sexual activity.  Eating habits.  History of falls.  Memory and ability to understand (cognition).  Work and work Statistician. What immunizations do I need?  Influenza (flu) vaccine  This is recommended every year. Tetanus, diphtheria, and pertussis (Tdap) vaccine  You may need a Td booster every 10 years. Varicella (chickenpox) vaccine  You may need this vaccine if you have not already been vaccinated. Zoster (shingles) vaccine  You may need this after age 70. Pneumococcal conjugate (PCV13) vaccine  One dose is recommended after age 40. Pneumococcal polysaccharide (PPSV23) vaccine  One dose is recommended after age 24. Measles, mumps, and rubella (MMR) vaccine  You may need at least one dose of MMR if you were born in 1955 or later. You may also need a second dose. Meningococcal conjugate (MenACWY) vaccine  You may need this if you have certain conditions. Hepatitis A vaccine  You may need this if you have certain conditions or if you travel or work in places where you may be exposed to hepatitis A. Hepatitis B  vaccine  You may need this if you have certain conditions or if you travel or work in places where you may be exposed to hepatitis B. Haemophilus influenzae type b (Hib) vaccine  You may need this if you have certain conditions. You may receive vaccines as individual doses or as more than one vaccine together in one shot (combination vaccines). Talk with your health care provider about the risks and benefits of combination vaccines. What tests do I need? Blood tests  Lipid and cholesterol levels. These may be checked every 5 years, or more frequently depending on your overall health.  Hepatitis C test.  Hepatitis B test. Screening  Lung cancer screening. You may have this screening every year starting at age 67 if you have a 30-pack-year history of smoking and currently smoke or have quit within the past 15 years.  Colorectal cancer screening. All adults should have this screening starting at age 77 and continuing until age 8. Your health care provider may recommend screening at age 74 if you are at increased risk. You will have tests every 1-10 years, depending on your results and the type of screening test.  Prostate cancer screening. Recommendations will vary depending on your family history and other risks.  Diabetes screening. This is done by checking your blood sugar (glucose) after you have not eaten for a while (fasting). You may have this done every 1-3 years.  Abdominal aortic aneurysm (AAA) screening. You may need this if you are a current or former smoker.  Sexually transmitted disease (STD) testing. Follow these instructions at home: Eating and drinking  Eat  a diet that includes fresh fruits and vegetables, whole grains, lean protein, and low-fat dairy products. Limit your intake of foods with high amounts of sugar, saturated fats, and salt.  Take vitamin and mineral supplements as recommended by your health care provider.  Do not drink alcohol if your health care  provider tells you not to drink.  If you drink alcohol: ? Limit how much you have to 0-2 drinks a day. ? Be aware of how much alcohol is in your drink. In the [place] one drink equals one 12 oz bottle of beer (355 mL), one 5 oz glass of wine (148 mL), or one 1 oz glass of hard liquor (44 mL). Lifestyle  Take daily care of your teeth and gums.  Stay active. Exercise for at least 30 minutes on 5 or more days each week.  Do not use any products that contain nicotine or tobacco, such as cigarettes, e-cigarettes, and chewing tobacco. If you need help quitting, ask your health care provider.  If you are sexually active, practice safe sex. Use a condom or other form of protection to prevent STIs (sexually transmitted infections).  Talk with your health care provider about taking a low-dose aspirin or statin. What's next?  Visit your health care provider once a year for a well check visit.  Ask your health care provider how often you should have your eyes and teeth checked.  Stay up to date on all vaccines. This information is not intended to replace advice given to you by your health care provider. Make sure you discuss any questions you have with your health care provider. Document Revised: 09/10/2018 Document Reviewed: 09/10/2018 Elsevier Patient Education  [place].  Diabetic Neuropathy Diabetic neuropathy refers to nerve damage that is caused by diabetes (diabetes mellitus). Over time, people with diabetes can develop nerve damage throughout the body. There are several types of diabetic neuropathy:  Peripheral neuropathy. This is the most common type of diabetic neuropathy. It causes damage to nerves that carry signals between the spinal cord and other parts of the body (peripheral nerves). This usually affects nerves in the feet and legs first, and may eventually affect the hands and arms. The damage affects the ability to sense touch or temperature.  Autonomic neuropathy. This  type causes damage to nerves that control involuntary functions (autonomic nerves). These nerves carry signals that control: ? Heartbeat. ? Body temperature. ? Blood pressure. ? Urination. ? Digestion. ? Sweating. ? Sexual function. ? Response to changing blood sugar (glucose) levels.  Focal neuropathy. This type of nerve damage affects one area of the body, such as an arm, a leg, or the face. The injury may involve one nerve or a small group of nerves. Focal neuropathy can be painful and unpredictable, and occurs most often in older adults with diabetes. This often develops suddenly, but usually improves over time and does not cause long-term problems.  Proximal neuropathy. This type of nerve damage affects the nerves of the thighs, hips, buttocks, or legs. It causes severe pain, weakness, and muscle death (atrophy), usually in the thigh muscles. It is more common among older men and people who have type 2 diabetes. The length of recovery time may vary. What are the causes? Peripheral, autonomic, and focal neuropathies are caused by diabetes that is not well controlled with treatment. The cause of proximal neuropathy is not known, but it may be caused by inflammation related to uncontrolled blood glucose levels. What are the signs or symptoms? Peripheral  neuropathy Peripheral neuropathy develops slowly over time. When the nerves of the feet and legs no longer work, you may experience:  Burning, stabbing, or aching pain in the legs or feet.  Pain or cramping in the legs or feet.  Loss of feeling (numbness) and inability to feel pressure or pain in the feet. This can lead to: ? Thick calluses or sores on areas of constant pressure. ? Ulcers. ? Reduced ability to feel temperature changes.  Foot deformities.  Muscle weakness.  Loss of balance or coordination. Autonomic neuropathy The symptoms of autonomic neuropathy vary depending on which nerves are affected. Symptoms may  include:  Problems with digestion, such as: ? Nausea or vomiting. ? Poor appetite. ? Bloating. ? Diarrhea or constipation. ? Trouble swallowing. ? Losing weight without trying to.  Problems with the heart, blood and lungs, such as: ? Dizziness, especially when standing up. ? Fainting. ? Shortness of breath. ? Irregular heartbeat.  Bladder problems, such as: ? Trouble starting or stopping urination. ? Leaking urine. ? Trouble emptying the bladder. ? Urinary tract infections (UTIs).  Problems with other body functions, such as: ? Sweat. You may sweat too much or too little. ? Temperature. You might get hot easily. Or, you might feel cold more than usual. ? Sexual function. Men may not be able to get or maintain an erection. Women may have vaginal dryness and difficulty with arousal. Focal neuropathy Symptoms affect only one area of the body. Common symptoms include:  Numbness.  Tingling.  Burning pain.  Prickling feeling.  Very sensitive skin.  Weakness.  Inability to move (paralysis).  Muscle twitching.  Muscles getting smaller (wasting).  Poor coordination.  Double or blurred vision. Proximal neuropathy  Sudden, severe pain in the hip, thigh, or buttocks. Pain may spread from the back into the legs (sciatica).  Pain and numbness in the arms and legs.  Tingling.  Loss of bladder control or bowel control.  Weakness and wasting of thigh muscles.  Difficulty getting up from a seated position.  Abdominal swelling.  Unexplained weight loss. How is this diagnosed? Diagnosis usually involves reviewing your medical history and any symptoms you have. Diagnosis varies depending on the type of neuropathy your health care provider suspects. Peripheral neuropathy Your health care provider will check areas that are affected by your nervous system (neurologic exam), such as your reflexes, how you move, and what you can feel. You may have other tests, such  as:  Blood tests.  Removal and examination of fluid that surrounds the spinal cord (lumbar puncture).  CT scan.  MRI.  A test to check the nerves that control muscles (electromyogram, EMG).  Tests of how quickly messages pass through your nerves (nerve conduction velocity tests).  Removal of a small piece of nerve to be examined under a microscope (biopsy). Autonomic neuropathy You may have tests, such as:  Tests to measure your blood pressure and heart rate. This may include monitoring you while you are safely secured to an exam table that moves you from a lying position to an upright position (table tilt test).  Breathing tests to check your lungs.  Tests to check how food moves through the digestive system (gastric emptying tests).  Blood, sweat, or urine tests.  Ultrasound of your bladder.  Spinal fluid tests. Focal neuropathy This condition may be diagnosed with:  A neurologic exam.  CT scan.  MRI.  EMG.  Nerve conduction velocity tests. Proximal neuropathy There is no test to diagnose this type  of neuropathy. You may have tests to rule out other possible causes of this type of neuropathy. Tests may include:  X-rays of your spine and lumbar region.  Lumbar puncture.  MRI. How is this treated? The goal of treatment is to keep nerve damage from getting worse. The most important part of treatment is keeping your blood glucose level and your A1C level within your target range by following your diabetes management plan. Over time, maintaining lower blood glucose levels helps lessen symptoms. In some cases, you may need prescription pain medicine. Follow these instructions at home:  Lifestyle   Do not use any products that contain nicotine or tobacco, such as cigarettes and e-cigarettes. If you need help quitting, ask your health care provider.  Be physically active every day. Include strength training and balance exercises.  Follow a healthy meal  plan.  Work with your health care provider to manage your blood pressure. General instructions  Follow your diabetes management plan as directed. ? Check your blood glucose levels as directed by your health care provider. ? Keep your blood glucose in your target range as directed by your health care provider. ? Have your A1C level checked at least two times a year, or as often as told by your health care provider.  Take over the counter and prescription medicines only as told by your health care provider. This includes insulin and diabetes medicine.  Do not drive or use heavy machinery while taking prescription pain medicines.  Check your skin and feet every day for cuts, bruises, redness, blisters, or sores.  Keep all follow up visits as told by your health care provider. This is important. Contact a health care provider if:  You have burning, stabbing, or aching pain in your legs or feet.  You are unable to feel pressure or pain in your feet.  You develop problems with digestion, such as: ? Nausea. ? Vomiting. ? Bloating. ? Constipation. ? Diarrhea. ? Abdominal pain.  You have difficulty with urination, such as inability: ? To control when you urinate (incontinence). ? To completely empty the bladder (retention).  You have palpitations.  You feel dizzy, weak, or faint when you stand up. Get help right away if:  You cannot urinate.  You have sudden weakness or loss of coordination.  You have trouble speaking.  You have pain or pressure in your chest.  You have an irregular heart beat.  You have sudden inability to move a part of your body. Summary  Diabetic neuropathy refers to nerve damage that is caused by diabetes. It can affect nerves throughout the entire body, causing numbness and pain in the arms, legs, digestive tract, heart, and other body systems.  Keep your blood glucose level and your blood pressure in your target range, as directed by your health  care provider. This can help prevent neuropathy from getting worse.  Check your skin and feet every day for cuts, bruises, redness, blisters, or sores.  Do not use any products that contain nicotine or tobacco, such as cigarettes and e-cigarettes. If you need help quitting, ask your health care provider. This information is not intended to replace advice given to you by your health care provider. Make sure you discuss any questions you have with your health care provider. Document Revised: 10/29/2017 Document Reviewed: 10/21/2016 Elsevier Patient Education  [place].  Peripheral Edema  Peripheral edema is swelling that is caused by a buildup of fluid. Peripheral edema most often affects the lower legs,  ankles, and feet. It can also develop in the arms, hands, and face. The area of the body that has peripheral edema will look swollen. It may also feel heavy or warm. Your clothes may start to feel tight. Pressing on the area may make a temporary dent in your skin. You may not be able to move your swollen arm or leg as much as usual. There are many causes of peripheral edema. It can happen because of a complication of other conditions such as congestive heart failure, kidney disease, or a problem with your blood circulation. It also can be a side effect of certain medicines or because of an infection. It often happens to women during pregnancy. Sometimes, the cause is not known. Follow these instructions at home: Managing pain, stiffness, and swelling   Raise (elevate) your legs while you are sitting or lying down.  Move around often to prevent stiffness and to lessen swelling.  Do not sit or stand for long periods of time.  Wear support stockings as told by your health care provider. Medicines  Take over-the-counter and prescription medicines only as told by your health care provider.  Your health care provider may prescribe medicine to help your body get rid of excess water  (diuretic). General instructions  Pay attention to any changes in your symptoms.  Follow instructions from your health care provider about limiting salt (sodium) in your diet. Sometimes, eating less salt may reduce swelling.  Moisturize skin daily to help prevent skin from cracking and draining.  Keep all follow-up visits as told by your health care provider. This is important. Contact a health care provider if you have:  A fever.  Edema that starts suddenly or is getting worse, especially if you are pregnant or have a medical condition.  Swelling in only one leg.  Increased swelling, redness, or pain in one or both of your legs.  Drainage or sores at the area where you have edema. Get help right away if you:  Develop shortness of breath, especially when you are lying down.  Have pain in your chest or abdomen.  Feel weak.  Feel faint. Summary  Peripheral edema is swelling that is caused by a buildup of fluid. Peripheral edema most often affects the lower legs, ankles, and feet.  Move around often to prevent stiffness and to lessen swelling. Do not sit or stand for long periods of time.  Pay attention to any changes in your symptoms.  Contact a health care provider if you have edema that starts suddenly or is getting worse, especially if you are pregnant or have a medical condition.  Get help right away if you develop shortness of breath, especially when lying down. This information is not intended to replace advice given to you by your health care provider. Make sure you discuss any questions you have with your health care provider. Document Revised: 06/10/2018 Document Reviewed: 06/10/2018 Elsevier Patient Education  [place], Adult The ears produce a substance called earwax that helps keep bacteria out of the ear and protects the skin in the ear canal. Occasionally, earwax can build up in the ear and cause discomfort or hearing loss. What  increases the risk? This condition is more likely to develop in people who:  Are male.  Are elderly.  Naturally produce more earwax.  Clean their ears often with cotton swabs.  Use earplugs often.  Use in-ear headphones often.  Wear hearing aids.  Have narrow ear canals.  Have earwax that is overly thick or sticky.  Have eczema.  Are dehydrated.  Have excess hair in the ear canal. What are the signs or symptoms? Symptoms of this condition include:  Reduced or muffled hearing.  A feeling of fullness in the ear or feeling that the ear is plugged.  Fluid coming from the ear.  Ear pain.  Ear itch.  Ringing in the ear.  Coughing.  An obvious piece of earwax that can be seen inside the ear canal. How is this diagnosed? This condition may be diagnosed based on:  Your symptoms.  Your medical history.  An ear exam. During the exam, your health care provider will look into your ear with an instrument called an otoscope. You may have tests, including a hearing test. How is this treated? This condition may be treated by:  Using ear drops to soften the earwax.  Having the earwax removed by a health care provider. The health care provider may: ? Flush the ear with water. ? Use an instrument that has a loop on the end (curette). ? Use a suction device.  Surgery to remove the wax buildup. This may be done in severe cases. Follow these instructions at home:   Take over-the-counter and prescription medicines only as told by your health care provider.  Do not put any objects, including cotton swabs, into your ear. You can clean the opening of your ear canal with a washcloth or facial tissue.  Follow instructions from your health care provider about cleaning your ears. Do not over-clean your ears.  Drink enough fluid to keep your urine clear or pale yellow. This will help to thin the earwax.  Keep all follow-up visits as told by your health care provider. If  earwax builds up in your ears often or if you use hearing aids, consider seeing your health care provider for routine, preventive ear cleanings. Ask your health care provider how often you should schedule your cleanings.  If you have hearing aids, clean them according to instructions from the manufacturer and your health care provider. Contact a health care provider if:  You have ear pain.  You develop a fever.  You have blood, pus, or other fluid coming from your ear.  You have hearing loss.  You have ringing in your ears that does not go away.  Your symptoms do not improve with treatment.  You feel like the room is spinning (vertigo). Summary  Earwax can build up in the ear and cause discomfort or hearing loss.  The most common symptoms of this condition include reduced or muffled hearing and a feeling of fullness in the ear or feeling that the ear is plugged.  This condition may be diagnosed based on your symptoms, your medical history, and an ear exam.  This condition may be treated by using ear drops to soften the earwax or by having the earwax removed by a health care provider.  Do not put any objects, including cotton swabs, into your ear. You can clean the opening of your ear canal with a washcloth or facial tissue. This information is not intended to replace advice given to you by your health care provider. Make sure you discuss any questions you have with your health care provider. Document Revised: 08/29/2017 Document Reviewed: 11/27/2016 Elsevier Patient Education  [place].  Hearing Loss Hearing loss is a partial or total loss of the ability to hear. This can be temporary or permanent, and it can happen in one  or both ears. Medical care is necessary to treat hearing loss properly and to prevent the condition from getting worse. Your hearing may partially or completely come back, depending on what caused your hearing loss and how severe it is. In some cases,  hearing loss is permanent. What are the causes? Common causes of hearing loss include:  Too much wax in the ear canal.  Infection of the ear canal or middle ear.  Fluid in the middle ear.  Injury to the ear or surrounding area.  An object stuck in the ear.  A history of prolonged exposure to loud sounds, such as music. Less common causes of hearing loss include:  Tumors in the ear.  Viral or bacterial infections, such as meningitis.  A hole in the eardrum (perforated eardrum).  Problems with the hearing nerve that sends signals between the brain and the ear.  Certain medicines. What are the signs or symptoms? Symptoms of this condition may include:  Difficulty telling the difference between sounds.  Difficulty following a conversation when there is background noise.  Lack of response to sounds in your environment. This may be most noticeable when you do not respond to startling sounds.  Needing to turn up the volume on the television, radio, or other devices.  Ringing in the ears.  Dizziness. How is this diagnosed? This condition is diagnosed based on:  A physical exam.  A hearing test (audiometry). The audiometry test will be performed by a hearing specialist (audiologist). You may also be referred to an ear, nose, and throat (ENT) specialist (otolaryngologist). How is this treated? Treatment for hearing loss may include:  Ear wax removal.  Medicines to treat or prevent infection (antibiotics).  Medicines to reduce inflammation (corticosteroids).  Hearing aids for hearing loss related to nerve damage. Follow these instructions at home:  If you were prescribed an antibiotic medicine, take it as told by your health care provider. Do not stop taking the antibiotic even if you start to feel better.  Take over-the-counter and prescription medicines only as told by your health care provider.  Avoid loud noises.  Return to your normal activities as told by  your health care provider. Ask your health care provider what activities are safe for you.  Keep all follow-up visits as told by your health care provider. This is important. Contact a health care provider if:  You feel dizzy.  You develop new symptoms.  You vomit or feel nauseous.  You have a fever. Get help right away if:  You develop sudden changes in your vision.  You have severe ear pain.  You have new or increased weakness.  You have a severe headache. Summary  Hearing loss is a decreased ability to hear sounds around you. It can be temporary or permanent.  Treatment will depend on the cause of your hearing loss. It may include ear wax removal, medicines, or a hearing aid.  Your hearing may partially or completely come back, depending on what caused your hearing loss and how severe it is.  Keep all follow-up visits as told by your health care provider. This is important. This information is not intended to replace advice given to you by your health care provider. Make sure you discuss any questions you have with your health care provider. Document Revised: 06/16/2018 Document Reviewed: 06/16/2018 Elsevier Patient Education  [place].

## 2020-03-23 ENCOUNTER — Other Ambulatory Visit: Payer: Self-pay

## 2020-03-24 ENCOUNTER — Ambulatory Visit (INDEPENDENT_AMBULATORY_CARE_PROVIDER_SITE_OTHER): Payer: Medicare Other | Admitting: Family Medicine

## 2020-03-24 ENCOUNTER — Encounter: Payer: Self-pay | Admitting: Family Medicine

## 2020-03-24 VITALS — BP 128/70 | HR 86 | Temp 97.5°F | Wt 245.0 lb

## 2020-03-24 DIAGNOSIS — L304 Erythema intertrigo: Secondary | ICD-10-CM | POA: Diagnosis not present

## 2020-03-24 DIAGNOSIS — M19012 Primary osteoarthritis, left shoulder: Secondary | ICD-10-CM | POA: Diagnosis not present

## 2020-03-24 MED ORDER — DICLOFENAC SODIUM 1 % EX GEL: 2.0000 g | Freq: Four times a day (QID) | CUTANEOUS | 0 refills | Status: DC

## 2020-03-24 MED ORDER — NYSTATIN 100000 UNIT/GM EX POWD: 1.0000 "application " | Freq: Three times a day (TID) | CUTANEOUS | 0 refills | Status: DC

## 2020-03-24 MED ORDER — MELOXICAM 7.5 MG PO TABS: 7.5000 mg | ORAL_TABLET | Freq: Every day | ORAL | 0 refills | Status: DC

## 2020-03-24 MED ORDER — FLUCONAZOLE 150 MG PO TABS: 150.0000 mg | ORAL_TABLET | Freq: Once | ORAL | 0 refills | Status: AC

## 2020-03-24 MED ORDER — PREDNISONE 10 MG PO TABS: ORAL_TABLET | ORAL | 0 refills | Status: DC

## 2020-03-24 NOTE — Progress Notes (Signed)
Subjective:    Patient ID: Jimmy Carr, male    DOB: [birth year], 79 y.o.   MRN: [id number]  No chief complaint on file.   HPI Patient was seen today for acute concerns.   Pt notes L shoulder pain radiating into L elbow and hand at times.  Pain keeps pt up at night.  Aleve helps decrease pain.  Pt also notes heat rash in left groin.  Area is pruritic and at times burns.  Pt tried a powder which has not helped.  Past Medical History:  Diagnosis Date  . AF (atrial fibrillation) ([place])    2D ECHO, 03/01/1987 - normal; NUCLEAR STRESS TEST, 04/10/2006 - EF 65%, no ischemia  . Bruit    CAROTID DOPPLER, 09/20/2008 - normal, no evidence of diameter reduction, significant tortuousity or any other vascular abnormality  . Cerebrovascular accident [facility]) 2003   history of small vessal lacunar stroke 2003  . Diabetes mellitus type II   . Glaucoma   . Hyperlipidemia   . Hypertension   . 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 Peripheral neuropathy     Allergies  Allergen Reactions  . Cheratussin Ac [Guaifenesin-Codeine] Nausea And Vomiting  . Verapamil     ROS General: Denies fever, chills, night sweats, changes in weight, changes in appetite HEENT: Denies headaches, ear pain, changes in vision, rhinorrhea, sore throat CV: Denies CP, palpitations, SOB, orthopnea Pulm: Denies SOB, cough, wheezing GI: Denies abdominal pain, nausea, vomiting, diarrhea, constipation GU: Denies dysuria, hematuria, frequency, vaginal discharge Msk: Denies muscle cramps, joint pains  +L shoulder pain radiating down LUE Neuro: Denies weakness, numbness, tingling Skin: Denies bruising  +rash Psych: Denies depression, anxiety, hallucinations    Objective:    Blood pressure 128/70, pulse 86, temperature (!) 97.5 F (36.4 C), temperature source Temporal, weight 245 lb (111.1 kg), SpO2 97 %.  Gen. Pleasant, well-nourished, in no distress, normal affect  HEENT: [place]/AT, face symmetric, no scleral  icterus, PERRLA, EOMI, nares patent without drainage Lungs: no accessory muscle use Cardiovascular: RRR, no peripheral edema Musculoskeletal: no TTP of cervical, thoracic, or lumbar spine.  No TTP of scapula or clavicles b/l.  Mild TTP of L AC joint.  No TTP in Humerus, elbow, or forearm.  Normal ROM of b/l UEs.  No deformities, no cyanosis or clubbing, normal tone Neuro:  A&Ox3, CN II-XII intact, normal gait Skin:  Warm.  Large area of skin in left inguinal area erythematous, moist,  excoriated appearing plaque with satellite lesions in the periphery.  multiple seborrheic keratosis noted on skin.   Wt Readings from Last 3 Encounters:  03/02/20 242 lb (109.8 kg)  01/24/20 241 lb (109.3 kg)  12/04/19 240 lb (108.9 kg)    Lab Results  Component Value Date   WBC 8.2 03/02/2020   HGB 15.8 03/02/2020   HCT 46.7 03/02/2020   PLT 171.0 03/02/2020   GLUCOSE 158 (H) 03/02/2020   CHOL 136 03/02/2020   TRIG 147.0 03/02/2020   HDL 36.00 (L) 03/02/2020   LDLDIRECT 67.0 05/16/2015   LDLCALC 70 03/02/2020   ALT 31 03/02/2020   AST 19 03/02/2020   NA 139 03/02/2020   K 3.9 03/02/2020   CL 100 03/02/2020   CREATININE 1.28 03/02/2020   BUN 23 03/02/2020   CO2 27 03/02/2020   TSH 1.86 12/31/2017   PSA 1.92 03/02/2020   HGBA1C 7.9 (H) 03/02/2020   MICROALBUR 2.0 (H) 12/31/2017    Assessment/Plan:  Arthritis of left acromioclavicular joint  -  Reviewed imaging of left shoulder from 10/19/2018.  X-ray with mild arthritis and spurring in left AC and glenohumeral joint and greater tuberosity. -discussed supportive care.   -discussed r/b/a of prednisone use.  Patient advised blood sugar likely to increase.  May have to take additional units of insulin over the next few or so. -Given precautions-for continued or worsening symptoms discussed referral to Ortho. -Consider joint injection in the future. - Plan: meloxicam (MOBIC) 7.5 MG tablet, predniSONE (DELTASONE) 10 MG tablet, diclofenac Sodium  (VOLTAREN) 1 % GEL  Intertrigo  -given handout -discussed po and topical med given size of area involved. - Plan: fluconazole (DIFLUCAN) 150 MG tablet, nystatin (MYCOSTATIN/NYSTOP) powder  F/u in the next few wks prn  Grier Mitts, MD

## 2020-03-24 NOTE — Patient Instructions (Signed)
Arthritis Arthritis is a term that is commonly used to refer to joint pain or joint disease. There are more than 100 types of arthritis. What are the causes? The most common cause of this condition is wear and tear of a joint. Other causes include:  Gout.  Inflammation of a joint.  An infection of a joint.  Sprains and other injuries near the joint.  A reaction to medicines or drugs, or an allergic reaction. In some cases, the cause may not be known. What are the signs or symptoms? The main symptom of this condition is pain in the joint during movement. Other symptoms include:  Redness, swelling, or stiffness at a joint.  Warmth coming from the joint.  Fever.  Overall feeling of illness. How is this diagnosed? This condition may be diagnosed with a physical exam and tests, including:  Blood tests.  Urine tests.  Imaging tests, such as X-rays, an MRI, or a CT scan. Sometimes, fluid is removed from a joint for testing. How is this treated? This condition may be treated with:  Treatment of the cause, if it is known.  Rest.  Raising (elevating) the joint.  Applying cold or hot packs to the joint.  Medicines to improve symptoms and reduce inflammation.  Injections of a steroid such as cortisone into the joint to help reduce pain and inflammation. Depending on the cause of your arthritis, you may need to make lifestyle changes to reduce stress on your joint. Changes may include:  Exercising more.  Losing weight. Follow these instructions at home: Medicines  Take over-the-counter and prescription medicines only as told by your health care provider.  Do not take aspirin to relieve pain if your health care provider thinks that gout may be causing your pain. Activity  Rest your joint if told by your health care provider. Rest is important when your disease is active and your joint feels painful, swollen, or stiff.  Avoid activities that make the pain worse. It is  important to balance activity with rest.  Exercise your joint regularly with range-of-motion exercises as told by your health care provider. Try doing low-impact exercise, such as: ? Swimming. ? Water aerobics. ? Biking. ? Walking. Managing pain, stiffness, and swelling      If directed, put ice on the joint. ? Put ice in a plastic bag. ? Place a towel between your skin and the bag. ? Leave the ice on for 20 minutes, 2-3 times per day.  If your joint is swollen, raise (elevate) it above the level of your heart if directed by your health care provider.  If your joint feels stiff in the morning, try taking a warm shower.  If directed, apply heat to the affected area as often as told by your health care provider. Use the heat source that your health care provider recommends, such as a moist heat pack or a heating pad. If you have diabetes, do not apply heat without permission from your health care provider. To apply heat: ? Place a towel between your skin and the heat source. ? Leave the heat on for 20-30 minutes. ? Remove the heat if your skin turns bright red. This is especially important if you are unable to feel pain, heat, or cold. You may have a greater risk of getting burned. General instructions  Do not use any products that contain nicotine or tobacco, such as cigarettes, e-cigarettes, and chewing tobacco. If you need help quitting, ask your health care provider.  Keep   all follow-up visits as told by your health care provider. This is important. Contact a health care provider if:  The pain gets worse.  You have a fever. Get help right away if:  You develop severe joint pain, swelling, or redness.  Many joints become painful and swollen.  You develop severe back pain.  You develop severe weakness in your leg.  You cannot control your bladder or bowels. Summary  Arthritis is a term that is commonly used to refer to joint pain or joint disease. There are more than  100 types of arthritis.  The most common cause of this condition is wear and tear of a joint. Other causes include gout, inflammation or infection of the joint, sprains, or allergies.  Symptoms of this condition include redness, swelling, or stiffness of the joint. Other symptoms include warmth, fever, or feeling ill.  This condition is treated with rest, elevation, medicines, and applying cold or hot packs.  Follow your health care provider's instructions about medicines, activity, exercises, and other home care treatments. This information is not intended to replace advice given to you by your health care provider. Make sure you discuss any questions you have with your health care provider. Document Revised: 08/24/2018 Document Reviewed: 08/24/2018 Elsevier Patient Education  2020 [employer] is skin irritation or inflammation (dermatitis) that occurs when folds of skin rub together. The irritation can cause a rash and make skin raw and itchy. This condition most commonly occurs in the skin folds of these areas:  Toes.  Armpits.  Groin.  Under the belly.  Under the breasts.  Buttocks. Intertrigo is not passed from person to person (is not contagious). What are the causes? This condition is caused by heat, moisture, rubbing (friction), and not enough air circulation. The condition can be made worse by:  Sweat.  Bacteria.  A fungus, such as yeast. What increases the risk? This condition is more likely to occur if you have moisture in your skin folds. You are more likely to develop this condition if you:  Have diabetes.  Are overweight.  Are not able to move around or are not active.  Live in a warm and moist climate.  Wear splints, braces, or other medical devices.  Are not able to control your bowels or bladder (have incontinence). What are the signs or symptoms? Symptoms of this condition include:  A pink or red skin rash in the skin  fold or near the skin fold.  Raw or scaly skin.  Itchiness.  A burning feeling.  Bleeding.  Leaking fluid.  A bad smell. How is this diagnosed? This condition is diagnosed with a medical history and physical exam. You may also have a skin swab to test for bacteria or a fungus. How is this treated? This condition may be treated by:  Cleaning and drying your skin.  Taking an antibiotic medicine or using an antibiotic skin cream for a bacterial infection.  Using an antifungal cream on your skin or taking pills for an infection that was caused by a fungus, such as yeast.  Using a steroid ointment to relieve itchiness and irritation.  Separating the skin fold with a clean cotton cloth to absorb moisture and allow air to flow into the area. Follow these instructions at home:  Keep the affected area clean and dry.  Do not scratch your skin.  Stay in a cool environment as much as possible. Use an air conditioner or fan, if available.  Apply over-the-counter  and prescription medicines only as told by your health care provider.  If you were prescribed an antibiotic medicine, use it as told by your health care provider. Do not stop using the antibiotic even if your condition improves.  Keep all follow-up visits as told by your health care provider. This is important. How is this prevented?   Maintain a healthy weight.  Take care of your feet, especially if you have diabetes. Foot care includes: ? Wearing shoes that fit well. ? Keeping your feet dry. ? Wearing clean, breathable socks.  Protect the skin around your groin and buttocks, especially if you have incontinence. Skin protection includes: ? Following a regular cleaning routine. ? Using skin protectant creams, powders, or ointments. ? Changing protection pads frequently.  Do not wear tight clothes. Wear clothes that are loose, absorbent, and made of cotton.  Wear a bra that gives good support, if needed.  Shower  and dry yourself well after activity or exercise. Use a hair dryer on a cool setting to dry between skin folds, especially after you bathe.  If you have diabetes, keep your blood sugar under control. Contact a health care provider if:  Your symptoms do not improve with treatment.  Your symptoms get worse or they spread.  You notice increased redness and warmth.  You have a fever. Summary  Intertrigo is skin irritation or inflammation (dermatitis) that occurs when folds of skin rub together.  This condition is caused by heat, moisture, rubbing (friction), and not enough air circulation.  This condition may be treated by cleaning and drying your skin and with medicines.  Apply over-the-counter and prescription medicines only as told by your health care provider.  Keep all follow-up visits as told by your health care provider. This is important. This information is not intended to replace advice given to you by your health care provider. Make sure you discuss any questions you have with your health care provider. Document Revised: 02/16/2018 Document Reviewed: 02/16/2018 Elsevier Patient Education  [place].

## 2020-04-06 ENCOUNTER — Other Ambulatory Visit: Payer: Self-pay

## 2020-04-06 ENCOUNTER — Ambulatory Visit (INDEPENDENT_AMBULATORY_CARE_PROVIDER_SITE_OTHER): Payer: Medicare Other | Admitting: Internal Medicine

## 2020-04-06 VITALS — BP 138/76 | HR 82 | Ht 72.0 in | Wt 246.8 lb

## 2020-04-06 DIAGNOSIS — I2510 Atherosclerotic heart disease of native coronary artery without angina pectoris: Secondary | ICD-10-CM

## 2020-04-06 DIAGNOSIS — G459 Transient cerebral ischemic attack, unspecified: Secondary | ICD-10-CM | POA: Diagnosis not present

## 2020-04-06 DIAGNOSIS — I480 Paroxysmal atrial fibrillation: Secondary | ICD-10-CM

## 2020-04-06 DIAGNOSIS — I10 Essential (primary) hypertension: Secondary | ICD-10-CM

## 2020-04-06 DIAGNOSIS — Z794 Long term (current) use of insulin: Secondary | ICD-10-CM

## 2020-04-06 DIAGNOSIS — E1140 Type 2 diabetes mellitus with diabetic neuropathy, unspecified: Secondary | ICD-10-CM

## 2020-04-06 NOTE — Progress Notes (Signed)
OFFICE NOTE  Chief Complaint:  Follow-up  Primary Care Physician: [name], MD  HPI:  Jimmy Carr is a 79 year-old male with a history of PAF. His atrial fib actually started in the mid 80s. He was on Coumadin, beta blockers, and Lanoxin, and over the years, we have been able to wean him off his beta blockers. He is still on a low dose of Lanoxin and several years ago, we stopped the Coumadin, and his CHADS score at that time was 2, but with no atrial fibrillation in over 10 years, we stopped the Coumadin. He is on aspirin. He has had no recurrence of any atrial fib in over 15 years. He has no palpitations. No unusual shortness of breath. No chest pain. No exertional dyspnea. Minimal puffiness of his ankles at the end of the day. No dizziness, slurred speech, or blurred vision.  He is diabetic and has been so for over 30 years. His last hemoglobin A1c was 7.2. He now has a peripheral neuropathy that involves his feet, much greater than his hands. He had been tried on Neurontin with no real help. He checks his blood pressure at home. It has been under good control. He denies arthralgias or myalgias. He walks daily for at least 30 minutes, five to seven days a week. With this, he has no claudication, no unusual breathlessness. He has had no angina. He has diffuse T wave inversion on his EKG. He was catheterized in December 2009 that showed minimal coronary disease and normal LV function. He has no real complaints today, other than the increased cost of digoxin. Exactly unclear to me why he is on digoxin, as this medicine is unlikely to keep him out of atrial fibrillation. He does have a high CHADS2 score which is 4, despite in frequent atrial fibrillation. He continues on aspirin.  There are no new complaints of chest pain or worsening shortness of breath.  Jimmy Carr recently saw [name] in the office for what he thought was atypical chest pain. He recommended a nonsteroidal. He is  also reported more recent palpitations. He's been under significant stress recently. Today he called the office several times and spoke with our triage nurse about concern for atrial fibrillation. I did advise him that he may need to come in for an EKG to see if is actually in atrial fibrillation. Ultimately he did decide to do that.  Jimmy Carr returns today and is doing well. He is asymptomatic and has no more palpitations. We discussed to monitor his last visit if his symptoms did not improve but they seem to have improved. Overall he feels like he did well. He denies any chest pain. He reports good blood sugar control with his diabetes.  I saw Jimmy Carr back today in the office for follow-up. Overall he seems to be doing fairly well. He had recent laboratory work which shows an improvement in his lipid profile, however triglycerides remain elevated over 250. This is likely related to poorly controlled diabetes with an A1c of 7.8. He needs to continue to work on weight loss, exercise and reducing carbohydrates. He has not had any recurrent atrial fibrillation in over 15 years. Despite an elevated CHADSVASC score due to his low burden of A. fib he is only on aspirin therapy.  03/20/2017  Jimmy Carr returns today for follow-up. He denies any recurrent palpitations. He occasionally gets some lightheadedness. Recently he's had a feeling of discomfort in his stomach that goes  up to his chest. He says he feels hollow. He gets some lightheadedness and pain in the back of his neck. He describes it not like a headache but more of an aching sensation. He denies any exertional chest pain but has noticed more fatigue and dyspnea. EKG does not show atrial fibrillation today. He denies any hypoglycemia.  04/21/2018  Jimmy Carr returns today for follow-up.  Overall he is without complaints.  When I last saw him he was having some upper mid epigastric to lower central chest pain.  Ordered a stress test and a repeat  monitor for concern for possible A. fib.  He says his symptoms resolved spontaneously and he had neither test performed.  He says he is done well since that without any recurrent palpitations or chest pain.  In general he has had really good control over his blood sugars.  Recent hemoglobin A1c is 7 however on insulin and metformin.  Blood pressure is also well controlled 122/72.  LDL-C less than 70 on lovastatin.  EKG personally reviewed today shows sinus rhythm with nonspecific T wave changes at 76.  08/18/2019  Jimmy Carr is seen today as an add-on.  He was seen 2 days ago at [facility] in [place].  He had presented with symptoms concerning for TIA.  Cerebral imaging was performed including an echo which showed normal EF and negative bubble study.  He was noted on monitoring to have some brief episodes of irregular rhythm concerning for A. fib.  He does have a remote history of A. fib in the past.  It was felt this is the likely cause of his symptoms and the recommendation was to switch him to Eliquis.  In addition, he did have a lipid profile which showed a total cholesterol 114 and LDL 54.  A high potency statin was recommended and it was advised to switch to a atorvastatin from lovastatin.  04/06/2020  Jimmy Carr is seen today in follow-up.  He denies any recurrent TIAs.  Unfortunately after demonstrating what was felt to be atrial fibrillation on imaging studies I had recommended starting anticoagulation with Eliquis.  He decided that he was concerned too much about the risks of bleeding on that and is not currently taking it.  He remains only on low-dose aspirin.  He understands that I feel he is at increased risk for stroke and that his TIA may have been related to A. fib.  EKG today however does not show any atrial fibrillation.  PMHx:  Past Medical History:  Diagnosis Date   AF (atrial fibrillation) ([place])    2D ECHO, 03/01/1987 - normal; NUCLEAR STRESS TEST, 04/10/2006 - EF 65%, no ischemia    Bruit    CAROTID DOPPLER, 09/20/2008 - normal, no evidence of diameter reduction, significant tortuousity or any other vascular abnormality   Cerebrovascular accident [facility]) 2003   history of small vessal lacunar stroke 2003   Diabetes mellitus type II    Glaucoma    Hyperlipidemia    Hypertension    Other specified cardiac dysrhythmias(427.89)    PSVT   Peripheral neuropathy     Past Surgical History:  Procedure Laterality Date   ANAL FISSURE REPAIR     CARDIAC CATHETERIZATION  02/02/2001   Normal LV systolic function, questionable mitral valve prolapse without mitral regurgitation, no evidence of CAD   CARDIAC CATHETERIZATION  09/13/2008   Minimal CAD, normal LV systolic function, no evidence of renal artery stenosis, distal aortic disease, or iliac disease    FAMHx:  Family History  Problem Relation Age of Onset   Diabetes Neg Hx    Colon cancer Neg Hx     SOCHx:   reports that he has never smoked. He has never used smokeless tobacco. He reports that he does not drink alcohol and does not use drugs.  ALLERGIES:  Allergies  Allergen Reactions   Cheratussin Ac [Guaifenesin-Codeine] Nausea And Vomiting   Verapamil     ROS: Pertinent items noted in HPI and remainder of comprehensive ROS otherwise negative.  HOME MEDS: Current Outpatient Medications  Medication Sig Dispense Refill   Accu-Chek Softclix Lancets lancets Use as instructed 100 each 12   aspirin 81 MG chewable tablet Chew by mouth daily.     Blood Glucose Monitoring Suppl (ACCU-CHEK AVIVA PLUS) w/Device KIT USED TO CHECK BLOOD SUGAR 3 TIMES DAILY OR PRN. 1 kit 0   diclofenac Sodium (VOLTAREN) 1 % GEL Apply 2 g topically 4 (four) times daily. 50 g 0   glucose blood (PRODIGY NO CODING BLOOD GLUC) test strip USE THREE TIMES DAILY AS DIRECTED 300 strip 0   HUMALOG KWIKPEN 100 UNIT/ML KwikPen INJECT 50 UNITS UNDER THE SKIN AT BREAKFAST, 35 UNITS WITH LUNCH AND 50 UNITS WITH SUPPER 120 mL 0    hydrochlorothiazide (HYDRODIURIL) 25 MG tablet TAKE 1 TABLET(25 MG) BY MOUTH DAILY 90 tablet 0   HYDROcodone-acetaminophen (NORCO/VICODIN) 5-325 MG tablet Take 1 tablet by mouth every 6 (six) hours as needed for moderate pain or severe pain. 12 tablet 0   insulin lispro (HUMALOG) 100 UNIT/ML injection Inject 40 Units into the skin 3 (three) times daily before meals.     Insulin Pen Needle (B-D ULTRAFINE III SHORT PEN) 31G X 8 MM MISC USE FOUR TIMES DAILY BEFORE MEALS AND AT BEDTIME 200 each 5   LANTUS SOLOSTAR 100 UNIT/ML Solostar Pen ADMINISTER 60 UNITS UNDER THE SKIN AT 10 PM 15 mL 2   latanoprost (XALATAN) 0.005 % ophthalmic solution 1 drop daily.     lisinopril (ZESTRIL) 40 MG tablet TAKE 1 TABLET(40 MG) BY MOUTH DAILY 90 tablet 0   lovastatin (MEVACOR) 40 MG tablet Take 1 tablet (40 mg total) by mouth at bedtime. 90 tablet 3   meloxicam (MOBIC) 7.5 MG tablet Take 1 tablet (7.5 mg total) by mouth daily. 30 tablet 0   metFORMIN (GLUCOPHAGE) 1000 MG tablet TAKE 1 TABLET(1000 MG) BY MOUTH TWICE DAILY WITH A MEAL 180 tablet 0   nystatin (MYCOSTATIN/NYSTOP) powder Apply 1 application topically 3 (three) times daily. 15 g 0   predniSONE (DELTASONE) 10 MG tablet Take 5 tabs on day 1, 4 tabs on day 2, 3 tabs on day 3, 2 tabs on day 4, 1 tab on day 5. 15 tablet 0   timolol (TIMOPTIC-XR) 0.5 % ophthalmic gel-forming Place 1 drop into both eyes daily.     Current Facility-Administered Medications  Medication Dose Route Frequency Provider Last Rate Last Admin   0.9 %  sodium chloride infusion  500 mL Intravenous Continuous Ladene Artist, MD        LABS/IMAGING: No results found for this or any previous visit (from the past 48 hour(s)). No results found.  VITALS: BP 138/76    Pulse 82    Ht 6' (1.829 m)    Wt 246 lb 12.8 oz (111.9 kg)    SpO2 98%    BMI 33.47 kg/m   EXAM: General appearance: alert and no distress Neck: no carotid bruit and no JVD Lungs: clear to auscultation  bilaterally Heart: regular rate and rhythm, S1, S2 normal, no murmur, click, rub or gallop Abdomen: soft, non-tender; bowel sounds normal; no masses,  no organomegaly Extremities: extremities normal, atraumatic, no cyanosis or edema Pulses: 2+ and symmetric Skin: Skin color, texture, turgor normal. No rashes or lesions Neurologic: Grossly normal Psych: Normal  EKG: Sinus rhythm at 82, nonspecific T wave changes-personally reviewed-personally reviewed  ASSESSMENT: 1. Recent TIA-suspect recurrent A. Fib, CHADSVASC score of 6 2. Remote history of paroxysmal atrial fibrillation 3. Hypertension 4. History of stroke, with right central vision loss 5. Type 2 diabetes on insulin 6. Dyslipidemia 7. Sphenoid mass  PLAN: 1.   Jimmy Carr denies any recurrent TIA.  He did not want to take anticoagulation even though we prescribed him Eliquis with concerning findings of recurrent possible A. fib based on his EKG.  He will continue to monitor for recurrent A. fib although the EKG shows no A. fib today.  He is on low-dose aspirin.  Follow-up with me in 6 months or sooner as necessary.  He does appear to have well-controlled lipids based on labs from March 02, 2020 with total cholesterol 136, HDL 36, LDL 70 and triglycerides 147.  Hemoglobin A1c is 7.9 which is still above ideal at 7.  Follow-up with me in 6 months or sooner as necessary.  Pixie Casino, MD, [facility], [place] Director of the [facility]  [facility] Diplomate of the American Board of Clinical Lipidology Attending Cardiologist  Direct Dial: [phone]   Fax: [phone]  Website:  [email or web address]  Nadean Corwin Madeliene Tejera 04/06/2020, 2:15 PM

## 2020-04-06 NOTE — Patient Instructions (Signed)

## 2020-04-07 ENCOUNTER — Encounter: Payer: Self-pay | Admitting: Internal Medicine

## 2020-04-13 ENCOUNTER — Other Ambulatory Visit: Payer: Self-pay | Admitting: Family Medicine

## 2020-04-19 ENCOUNTER — Other Ambulatory Visit: Payer: Self-pay | Admitting: Family Medicine

## 2020-04-21 ENCOUNTER — Other Ambulatory Visit: Payer: Self-pay | Admitting: Family Medicine

## 2020-04-21 DIAGNOSIS — M19012 Primary osteoarthritis, left shoulder: Secondary | ICD-10-CM

## 2020-04-22 ENCOUNTER — Other Ambulatory Visit: Payer: Self-pay | Admitting: Family Medicine

## 2020-04-23 ENCOUNTER — Other Ambulatory Visit: Payer: Self-pay | Admitting: Family Medicine

## 2020-05-01 DIAGNOSIS — H401133 Primary open-angle glaucoma, bilateral, severe stage: Secondary | ICD-10-CM | POA: Diagnosis not present

## 2020-05-01 DIAGNOSIS — H2513 Age-related nuclear cataract, bilateral: Secondary | ICD-10-CM | POA: Diagnosis not present

## 2020-05-01 DIAGNOSIS — E113291 Type 2 diabetes mellitus with mild nonproliferative diabetic retinopathy without macular edema, right eye: Secondary | ICD-10-CM | POA: Diagnosis not present

## 2020-05-08 ENCOUNTER — Other Ambulatory Visit: Payer: Self-pay | Admitting: Family Medicine

## 2020-05-21 ENCOUNTER — Other Ambulatory Visit: Payer: Self-pay | Admitting: Family Medicine

## 2020-05-21 DIAGNOSIS — I10 Essential (primary) hypertension: Secondary | ICD-10-CM

## 2020-06-29 ENCOUNTER — Other Ambulatory Visit: Payer: Self-pay | Admitting: Family Medicine

## 2020-07-13 ENCOUNTER — Ambulatory Visit: Payer: Self-pay | Admitting: Family Medicine

## 2020-07-14 ENCOUNTER — Ambulatory Visit (INDEPENDENT_AMBULATORY_CARE_PROVIDER_SITE_OTHER): Payer: Medicare Other | Admitting: Family Medicine

## 2020-07-14 ENCOUNTER — Ambulatory Visit (INDEPENDENT_AMBULATORY_CARE_PROVIDER_SITE_OTHER): Payer: Medicare Other

## 2020-07-14 ENCOUNTER — Other Ambulatory Visit: Payer: Self-pay

## 2020-07-14 ENCOUNTER — Encounter: Payer: Self-pay | Admitting: Family Medicine

## 2020-07-14 VITALS — BP 132/80 | HR 81 | Temp 98.1°F | Wt 246.4 lb

## 2020-07-14 DIAGNOSIS — M545 Low back pain, unspecified: Secondary | ICD-10-CM

## 2020-07-14 DIAGNOSIS — M25362 Other instability, left knee: Secondary | ICD-10-CM

## 2020-07-14 DIAGNOSIS — I2510 Atherosclerotic heart disease of native coronary artery without angina pectoris: Secondary | ICD-10-CM | POA: Diagnosis not present

## 2020-07-14 DIAGNOSIS — R319 Hematuria, unspecified: Secondary | ICD-10-CM | POA: Diagnosis not present

## 2020-07-14 DIAGNOSIS — M1712 Unilateral primary osteoarthritis, left knee: Secondary | ICD-10-CM | POA: Diagnosis not present

## 2020-07-14 DIAGNOSIS — M25472 Effusion, left ankle: Secondary | ICD-10-CM

## 2020-07-14 DIAGNOSIS — Z043 Encounter for examination and observation following other accident: Secondary | ICD-10-CM | POA: Diagnosis not present

## 2020-07-14 LAB — POC URINALSYSI DIPSTICK (AUTOMATED)
Bilirubin, UA: NEGATIVE
Blood, UA: NEGATIVE
Glucose, UA: NEGATIVE
Ketones, UA: NEGATIVE
Leukocytes, UA: NEGATIVE
Nitrite, UA: NEGATIVE
Protein, UA: NEGATIVE
Spec Grav, UA: 1.025 (ref 1.010–1.025)
Urobilinogen, UA: 0.2 E.U./dL
pH, UA: 5.5 (ref 5.0–8.0)

## 2020-07-14 NOTE — Patient Instructions (Signed)
Hematuria, Adult Hematuria is blood in the urine. Blood may be visible in the urine, or it may be identified with a test. This condition can be caused by infections of the bladder, urethra, kidney, or prostate. Other possible causes include:  Kidney stones.  Cancer of the urinary tract.  Too much calcium in the urine.  Conditions that are passed from parent to child (inherited conditions).  Exercise that requires a lot of energy. Infections can usually be treated with medicine, and a kidney stone usually will pass through your urine. If neither of these is the cause of your hematuria, more tests may be needed to identify the cause of your symptoms. It is very important to tell your health care provider about any blood in your urine, even if it is painless or the blood stops without treatment. Blood in the urine, when it happens and then stops and then happens again, can be a symptom of a very serious condition, including cancer. There is no pain in the initial stages of many urinary cancers. Follow these instructions at home: Medicines  Take over-the-counter and prescription medicines only as told by your health care provider.  If you were prescribed an antibiotic medicine, take it as told by your health care provider. Do not stop taking the antibiotic even if you start to feel better. Eating and drinking  Drink enough fluid to keep your urine clear or pale yellow. It is recommended that you drink 3-4 quarts (2.8-3.8 L) a day. If you have been diagnosed with an infection, it is recommended that you drink cranberry juice in addition to large amounts of water.  Avoid caffeine, tea, and carbonated beverages. These tend to irritate the bladder.  Avoid alcohol because it may irritate the prostate (men). General instructions  If you have been diagnosed with a kidney stone, follow your health care provider's instructions about straining your urine to catch the stone.  Empty your bladder  often. Avoid holding urine for long periods of time.  If you are male: ? After a bowel movement, wipe from front to back and use each piece of toilet paper only once. ? Empty your bladder before and after sex.  Pay attention to any changes in your symptoms. Tell your health care provider about any changes or any new symptoms.  It is your responsibility to get your test results. Ask your health care provider, or the department performing the test, when your results will be ready.  Keep all follow-up visits as told by your health care provider. This is important. Contact a health care provider if:  You develop back pain.  You have a fever.  You have nausea or vomiting.  Your symptoms do not improve after 3 days.  Your symptoms get worse. Get help right away if:  You develop severe vomiting and are unable take medicine without vomiting.  You develop severe pain in your back or abdomen even though you are taking medicine.  You pass a large amount of blood in your urine.  You pass blood clots in your urine.  You feel very weak or like you might faint.  You faint. Summary  Hematuria is blood in the urine. It has many possible causes.  It is very important that you tell your health care provider about any blood in your urine, even if it is painless or the blood stops without treatment.  Take over-the-counter and prescription medicines only as told by your health care provider.  Drink enough fluid to keep   your urine clear or pale yellow. This information is not intended to replace advice given to you by your health care provider. Make sure you discuss any questions you have with your health care provider. Document Revised: 02/10/2019 Document Reviewed: 10/19/2016 Elsevier Patient Education  [place].  Knee Effusion  Knee effusion means that you have extra fluid in your knee. This can cause pain. Your knee may be more difficult to bend and move. What are the  causes? Common causes are:  Arthritis.  Infection.  Injury.  Autoimmune disease. This means that your body's defense system (immune system) mistakenly attacks healthy body tissues. Follow these instructions at home: Medicines  Take medicines only as told by your doctor.  Do not drive or use heavy machinery while taking prescription pain medicine.  If you are taking prescription pain medicine, take actions to help prevent or treat trouble pooping (constipation). Your doctor may recommend that you: ? Drink enough fluid to keep your pee (urine) pale yellow. ? Eat foods that are high in fiber. These include fresh fruits and vegetables, whole grains, and beans. ? Avoid eating fatty or sweet foods. ? Take a medicine for constipation. If you have a brace:  Wear the brace as told by your doctor. Remove it only as told by your doctor.  Loosen the brace if your toes tingle, become numb, or turn cold and blue.  Keep the brace clean.  If the brace is not waterproof: ? Do not let it get wet. ? Cover it with a watertight covering when you take a bath or a shower. Managing pain, stiffness, and swelling   If told, apply ice to the swollen area: ? If you have a removable brace, remove it as told by your doctor. ? Put ice in a plastic bag. ? Place a towel between your skin and the bag. ? Leave the ice on for 20 minutes, 2-3 times per day.  Keep your knee raised (elevated) when you are sitting or lying down. General instructions  Do not use any products that contain nicotine or tobacco, such as cigarettes and e-cigarettes. These can delay healing. If you need help quitting, ask your doctor.  Use crutches as told by your doctor.  Do exercises as told by your doctor.  Rest as told by your doctor.  Keep all follow-up visits as told by your doctor. This is important. Contact a doctor if you:  Continue to have pain in your knee. Get help right away if you:  Have swelling or redness  of your knee that gets worse or does not get better.  Have serious pain in your knee.  Have a fever. Summary  Knee effusion is when you have extra fluid in your knee. This causes pain and swelling and makes it hard to bend and move your knee.  Take medicines only as told by your doctor.  If you have a brace, wear the brace as told by your doctor. This information is not intended to replace advice given to you by your health care provider. Make sure you discuss any questions you have with your health care provider. Document Revised: 09/29/2017 Document Reviewed: 09/28/2017 Elsevier Patient Education  [place].  Acute Back Pain, Adult Acute back pain is sudden and usually short-lived. It is often caused by an injury to the muscles and tissues in the back. The injury may result from:  A muscle or ligament getting overstretched or torn (strained). Ligaments are tissues that connect bones to  each other. Lifting something improperly can cause a back strain.  Wear and tear (degeneration) of the spinal disks. Spinal disks are circular tissue that provides cushioning between the bones of the spine (vertebrae).  Twisting motions, such as while playing sports or doing yard work.  A hit to the back.  Arthritis. You may have a physical exam, lab tests, and imaging tests to find the cause of your pain. Acute back pain usually goes away with rest and home care. Follow these instructions at home: Managing pain, stiffness, and swelling  Take over-the-counter and prescription medicines only as told by your health care provider.  Your health care provider may recommend applying ice during the first 24-48 hours after your pain starts. To do this: ? Put ice in a plastic bag. ? Place a towel between your skin and the bag. ? Leave the ice on for 20 minutes, 2-3 times a day.  If directed, apply heat to the affected area as often as told by your health care provider. Use the heat source that  your health care provider recommends, such as a moist heat pack or a heating pad. ? Place a towel between your skin and the heat source. ? Leave the heat on for 20-30 minutes. ? Remove the heat if your skin turns bright red. This is especially important if you are unable to feel pain, heat, or cold. You have a greater risk of getting burned. Activity   Do not stay in bed. Staying in bed for more than 1-2 days can delay your recovery.  Sit up and stand up straight. Avoid leaning forward when you sit, or hunching over when you stand. ? If you work at a desk, sit close to it so you do not need to lean over. Keep your chin tucked in. Keep your neck drawn back, and keep your elbows bent at a right angle. Your arms should look like the letter "L." ? Sit high and close to the steering wheel when you drive. Add lower back (lumbar) support to your car seat, if needed.  Take short walks on even surfaces as soon as you are able. Try to increase the length of time you walk each day.  Do not sit, drive, or stand in one place for more than 30 minutes at a time. Sitting or standing for long periods of time can put stress on your back.  Do not drive or use heavy machinery while taking prescription pain medicine.  Use proper lifting techniques. When you bend and lift, use positions that put less stress on your back: ? [place] your knees. ? Keep the load close to your body. ? Avoid twisting.  Exercise regularly as told by your health care provider. Exercising helps your back heal faster and helps prevent back injuries by keeping muscles strong and flexible.  Work with a physical therapist to make a safe exercise program, as recommended by your health care provider. Do any exercises as told by your physical therapist. Lifestyle  Maintain a healthy weight. Extra weight puts stress on your back and makes it difficult to have good posture.  Avoid activities or situations that make you feel anxious or stressed.  Stress and anxiety increase muscle tension and can make back pain worse. Learn ways to manage anxiety and stress, such as through exercise. General instructions  Sleep on a firm mattress in a comfortable position. Try lying on your side with your knees slightly bent. If you lie on your back, put a  pillow under your knees.  Follow your treatment plan as told by your health care provider. This may include: ? Cognitive or behavioral therapy. ? Acupuncture or massage therapy. ? Meditation or yoga. Contact a health care provider if:  You have pain that is not relieved with rest or medicine.  You have increasing pain going down into your legs or buttocks.  Your pain does not improve after 2 weeks.  You have pain at night.  You lose weight without trying.  You have a fever or chills. Get help right away if:  You develop new bowel or bladder control problems.  You have unusual weakness or numbness in your arms or legs.  You develop nausea or vomiting.  You develop abdominal pain.  You feel faint. Summary  Acute back pain is sudden and usually short-lived.  Use proper lifting techniques. When you bend and lift, use positions that put less stress on your back.  Take over-the-counter and prescription medicines and apply heat or ice as directed by your health care provider. This information is not intended to replace advice given to you by your health care provider. Make sure you discuss any questions you have with your health care provider. Document Revised: 01/05/2019 Document Reviewed: 04/30/2017 Elsevier Patient Education  [place].

## 2020-07-14 NOTE — Progress Notes (Signed)
Subjective:    Patient ID: Jimmy Carr, male    DOB: March 27, 1941, 79 y.o.   MRN: [id number]  No chief complaint on file.   HPI Patient was seen today for acute concerns.  Pt notes blood in toilet after urinating and in shorts.  Patient thinks he may kidney stones as he saw some debris in the toilet.  Drinking 4-6 bottles of water per day.  Patient mentions falling 2 weeks ago after missing a step letter from work.  Patient landed on his side twisting his left knee.  Left knee became swollen.  Pt using a cane at times as he feels his left knee may give out.  Pt mentions ongoing L ankle edema. Denies pain in calf or erythema.     Pt also notes falling last week and hearing a popping sound in his low back.  States at that time his knee gave out when he was standing up at the table but he was able to catch himself before falling.  Left side of low back worse than right.  Past Medical History:  Diagnosis Date  . AF (atrial fibrillation) ([place])    2D ECHO, 03/01/1987 - normal; NUCLEAR STRESS TEST, 04/10/2006 - EF 65%, no ischemia  . Bruit    CAROTID DOPPLER, 09/20/2008 - normal, no evidence of diameter reduction, significant tortuousity or any other vascular abnormality  . Cerebrovascular accident [facility]) 2003   history of small vessal lacunar stroke 2003  . Diabetes mellitus type II   . Glaucoma   . Hyperlipidemia   . Hypertension   . 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 Peripheral neuropathy     Allergies  Allergen Reactions  . Cheratussin Ac [Guaifenesin-Codeine] Nausea And Vomiting  . Verapamil     ROS General: Denies fever, chills, night sweats, changes in weight, changes in appetite HEENT: Denies headaches, ear pain, changes in vision, rhinorrhea, sore throat CV: Denies CP, palpitations, SOB, orthopnea Pulm: Denies SOB, cough, wheezing GI: Denies abdominal pain, nausea, vomiting, diarrhea, constipation GU: Denies dysuria, frequency + blood in urine Msk: Denies  muscle cramps, joint pains  + low back pain, left knee pain Neuro: Denies weakness, numbness, tingling Skin: Denies rashes, bruising Psych: Denies depression, anxiety, hallucinations      Objective:    Blood pressure 132/80, pulse 81, temperature 98.1 F (36.7 C), temperature source Oral, weight 246 lb 6.4 oz (111.8 kg), SpO2 98 %.  Gen. Pleasant, well-nourished, in no distress, normal affect   HEENT: [place]/AT, face symmetric, conjunctiva clear, no scleral icterus, PERRLA, EOMI, nares patent without drainage Lungs: no accessory muscle use, CTAB, no wheezes or rales Cardiovascular: RRR, no m/r/g, no peripheral edema Abdomen: BS present, soft, NT/ND, no hepatosplenomegaly.  No CVA tenderness Musculoskeletal: TTP of midline lumbar spine and paraspinal muscles.  No crepitus of bilateral knees.  Mild effusion of left knee.  no deformities, no cyanosis or clubbing, normal tone Neuro:  A&Ox3, CN II-XII intact, normal gait Skin:  Warm, no lesions/ rash   Wt Readings from Last 3 Encounters:  07/14/20 246 lb 6.4 oz (111.8 kg)  04/06/20 246 lb 12.8 oz (111.9 kg)  03/24/20 245 lb (111.1 kg)    Lab Results  Component Value Date   WBC 8.2 03/02/2020   HGB 15.8 03/02/2020   HCT 46.7 03/02/2020   PLT 171.0 03/02/2020   GLUCOSE 158 (H) 03/02/2020   CHOL 136 03/02/2020   TRIG 147.0 03/02/2020   HDL 36.00 (L) 03/02/2020  LDLDIRECT 67.0 05/16/2015   LDLCALC 70 03/02/2020   ALT 31 03/02/2020   AST 19 03/02/2020   NA 139 03/02/2020   K 3.9 03/02/2020   CL 100 03/02/2020   CREATININE 1.28 03/02/2020   BUN 23 03/02/2020   CO2 27 03/02/2020   TSH 1.86 12/31/2017   PSA 1.92 03/02/2020   HGBA1C 7.9 (H) 03/02/2020   MICROALBUR 2.0 (H) 12/31/2017    Assessment/Plan:  Hematuria, unspecified type -Discussed possible causes including UTI, renal calculi, abrasion -UA unremarkable, SG 1.025 no blood, leuk, or nitrites noted -Patient encouraged to increase p.o. intake of water and  fluids -Given precautions - Plan: POCT Urinalysis Dipstick (Automated), Urine Microscopic Only, Urine Culture, Brain Natriuretic Peptide, COMPLETE METABOLIC PANEL WITH GFR  Acute bilateral low back pain without sciatica -Discussed likely causes including musculoskeletal strain from recent fall, arthritis, disc herniation -Continue supportive care -We will obtain imaging -Patient to follow-up with Ortho -Given precautions - Plan: DG Lumbar Spine Complete, Ambulatory referral to Orthopedic Surgery, COMPLETE METABOLIC PANEL WITH GFR  Knee instability, left  -Discussed possible causes including arthritis, cartilage derangement -We will obtain imaging and place referral to Ortho -Continue supportive care including rest, ice, compression, elevation. - Plan: DG Knee Complete 4 Views Left, Ambulatory referral to Orthopedic Surgery  Edema of left ankle  -elevation, compression socks, etc - Plan: Brain Natriuretic Peptide, CBC with Differential/Platelet, D-dimer, Quantitative  F/u prn  Grier Mitts, MD

## 2020-07-15 LAB — COMPLETE METABOLIC PANEL WITH GFR
AG Ratio: 1.7 (calc) (ref 1.0–2.5)
ALT: 21 U/L (ref 9–46)
AST: 17 U/L (ref 10–35)
Albumin: 4.2 g/dL (ref 3.6–5.1)
Alkaline phosphatase (APISO): 61 U/L (ref 35–144)
BUN/Creatinine Ratio: 16 (calc) (ref 6–22)
BUN: 22 mg/dL (ref 7–25)
CO2: 27 mmol/L (ref 20–32)
Calcium: 9.8 mg/dL (ref 8.6–10.3)
Chloride: 102 mmol/L (ref 98–110)
Creat: 1.36 mg/dL — ABNORMAL HIGH (ref 0.70–1.18)
GFR, Est African American: 58 mL/min/{1.73_m2} — ABNORMAL LOW (ref >=60)
GFR, Est Non African American: 50 mL/min/{1.73_m2} — ABNORMAL LOW (ref >=60)
Globulin: 2.5 g/dL (calc) (ref 1.9–3.7)
Glucose, Bld: 138 mg/dL — ABNORMAL HIGH (ref 65–99)
Potassium: 4 mmol/L (ref 3.5–5.3)
Sodium: 140 mmol/L (ref 135–146)
Total Bilirubin: 0.9 mg/dL (ref 0.2–1.2)
Total Protein: 6.7 g/dL (ref 6.1–8.1)

## 2020-07-15 LAB — CBC WITH DIFFERENTIAL/PLATELET
Absolute Monocytes: 770 cells/uL (ref 200–950)
Basophils Absolute: 52 cells/uL (ref 0–200)
Basophils Relative: 0.7 %
Eosinophils Absolute: 237 cells/uL (ref 15–500)
Eosinophils Relative: 3.2 %
HCT: 45.4 % (ref 38.5–50.0)
Hemoglobin: 15.1 g/dL (ref 13.2–17.1)
Lymphs Abs: 2005 cells/uL (ref 850–3900)
MCH: 27.8 pg (ref 27.0–33.0)
MCHC: 33.3 g/dL (ref 32.0–36.0)
MCV: 83.6 fL (ref 80.0–100.0)
MPV: 9.6 fL (ref 7.5–12.5)
Monocytes Relative: 10.4 %
Neutro Abs: 4336 cells/uL (ref 1500–7800)
Neutrophils Relative %: 58.6 %
Platelets: 137 10*3/uL — ABNORMAL LOW (ref 140–400)
RBC: 5.43 10*6/uL (ref 4.20–5.80)
RDW: 14.4 % (ref 11.0–15.0)
Total Lymphocyte: 27.1 %
WBC: 7.4 10*3/uL (ref 3.8–10.8)

## 2020-07-15 LAB — URINALYSIS, MICROSCOPIC ONLY
Bacteria, UA: NONE SEEN /HPF
Hyaline Cast: NONE SEEN /LPF
RBC / HPF: NONE SEEN /HPF (ref 0–2)
Squamous Epithelial / HPF: NONE SEEN /HPF (ref <=5)

## 2020-07-15 LAB — URINE CULTURE
MICRO NUMBER:: 11077423
Result:: NO GROWTH
SPECIMEN QUALITY:: ADEQUATE

## 2020-07-15 LAB — BRAIN NATRIURETIC PEPTIDE: Brain Natriuretic Peptide: 35 pg/mL (ref <100)

## 2020-07-15 LAB — D-DIMER, QUANTITATIVE: D-Dimer, Quant: 0.43 mcg/mL FEU (ref <0.50)

## 2020-07-19 ENCOUNTER — Encounter: Payer: Self-pay | Admitting: Family Medicine

## 2020-07-25 ENCOUNTER — Other Ambulatory Visit: Payer: Self-pay | Admitting: Family Medicine

## 2020-08-04 ENCOUNTER — Other Ambulatory Visit: Payer: Self-pay

## 2020-08-04 ENCOUNTER — Encounter (HOSPITAL_COMMUNITY): Payer: Self-pay | Admitting: Emergency Medicine

## 2020-08-04 ENCOUNTER — Telehealth: Payer: Self-pay

## 2020-08-04 ENCOUNTER — Emergency Department (HOSPITAL_COMMUNITY)
Admission: EM | Admit: 2020-08-04 | Discharge: 2020-08-04 | Disposition: A | Discharge status: Home / Self Care | Payer: Medicare Other | Attending: Emergency Medicine | Admitting: Emergency Medicine

## 2020-08-04 ENCOUNTER — Emergency Department (HOSPITAL_COMMUNITY): Payer: Medicare Other

## 2020-08-04 DIAGNOSIS — Z5321 Procedure and treatment not carried out due to patient leaving prior to being seen by health care provider: Secondary | ICD-10-CM | POA: Insufficient documentation

## 2020-08-04 DIAGNOSIS — I499 Cardiac arrhythmia, unspecified: Secondary | ICD-10-CM | POA: Diagnosis not present

## 2020-08-04 DIAGNOSIS — M47814 Spondylosis without myelopathy or radiculopathy, thoracic region: Secondary | ICD-10-CM | POA: Diagnosis not present

## 2020-08-04 DIAGNOSIS — I4891 Unspecified atrial fibrillation: Secondary | ICD-10-CM | POA: Insufficient documentation

## 2020-08-04 DIAGNOSIS — H401133 Primary open-angle glaucoma, bilateral, severe stage: Secondary | ICD-10-CM | POA: Diagnosis not present

## 2020-08-04 LAB — BASIC METABOLIC PANEL
Anion gap: 12 (ref 5–15)
BUN: 20 mg/dL (ref 8–23)
CO2: 25 mmol/L (ref 22–32)
Calcium: 9.3 mg/dL (ref 8.9–10.3)
Chloride: 102 mmol/L (ref 98–111)
Creatinine, Ser: 1.36 mg/dL — ABNORMAL HIGH (ref 0.61–1.24)
GFR, Estimated: 54 mL/min — ABNORMAL LOW (ref >60)
Glucose, Bld: 140 mg/dL — ABNORMAL HIGH (ref 70–99)
Potassium: 3.9 mmol/L (ref 3.5–5.1)
Sodium: 139 mmol/L (ref 135–145)

## 2020-08-04 LAB — CBC
HCT: 51 % (ref 39.0–52.0)
Hemoglobin: 16.6 g/dL (ref 13.0–17.0)
MCH: 27.4 pg (ref 26.0–34.0)
MCHC: 32.5 g/dL (ref 30.0–36.0)
MCV: 84.2 fL (ref 80.0–100.0)
Platelets: 166 10*3/uL (ref 150–400)
RBC: 6.06 MIL/uL — ABNORMAL HIGH (ref 4.22–5.81)
RDW: 14.6 % (ref 11.5–15.5)
WBC: 8.8 10*3/uL (ref 4.0–10.5)
nRBC: 0 % (ref 0.0–0.2)

## 2020-08-04 NOTE — ED Notes (Signed)
Pt seen walking out with wife

## 2020-08-04 NOTE — Telephone Encounter (Signed)
Patient walked in complaining of possible a-fib. He was asleep when his daughter startled him awake and since then his heart rate has been irregular. He reports his heart is beating 2 times pausing then beating 3 times. He is pale in color. Patient is not anticoagulated. Patient advised to go to the nearest emergency room for evaluation. Patient being transported by family.

## 2020-08-04 NOTE — ED Triage Notes (Signed)
Pt reports hx of afib, states he was checking his pulse wed and noticed that his HR seemed irregular, denies chest pain or sob.

## 2020-08-06 DIAGNOSIS — E1165 Type 2 diabetes mellitus with hyperglycemia: Secondary | ICD-10-CM | POA: Diagnosis not present

## 2020-08-06 DIAGNOSIS — Z794 Long term (current) use of insulin: Secondary | ICD-10-CM | POA: Diagnosis not present

## 2020-08-06 DIAGNOSIS — I10 Essential (primary) hypertension: Secondary | ICD-10-CM | POA: Diagnosis not present

## 2020-08-06 DIAGNOSIS — E1169 Type 2 diabetes mellitus with other specified complication: Secondary | ICD-10-CM | POA: Diagnosis not present

## 2020-08-06 DIAGNOSIS — R9431 Abnormal electrocardiogram [ECG] [EKG]: Secondary | ICD-10-CM | POA: Diagnosis not present

## 2020-08-06 DIAGNOSIS — Z7902 Long term (current) use of antithrombotics/antiplatelets: Secondary | ICD-10-CM | POA: Diagnosis not present

## 2020-08-06 DIAGNOSIS — Z8673 Personal history of transient ischemic attack (TIA), and cerebral infarction without residual deficits: Secondary | ICD-10-CM | POA: Diagnosis not present

## 2020-08-06 DIAGNOSIS — Z79899 Other long term (current) drug therapy: Secondary | ICD-10-CM | POA: Diagnosis not present

## 2020-08-06 DIAGNOSIS — E785 Hyperlipidemia, unspecified: Secondary | ICD-10-CM | POA: Diagnosis not present

## 2020-08-06 DIAGNOSIS — I480 Paroxysmal atrial fibrillation: Secondary | ICD-10-CM | POA: Diagnosis not present

## 2020-08-06 DIAGNOSIS — Z7982 Long term (current) use of aspirin: Secondary | ICD-10-CM | POA: Diagnosis not present

## 2020-08-06 DIAGNOSIS — R079 Chest pain, unspecified: Secondary | ICD-10-CM | POA: Diagnosis not present

## 2020-08-07 DIAGNOSIS — H401133 Primary open-angle glaucoma, bilateral, severe stage: Secondary | ICD-10-CM | POA: Diagnosis not present

## 2020-08-07 DIAGNOSIS — H2513 Age-related nuclear cataract, bilateral: Secondary | ICD-10-CM | POA: Diagnosis not present

## 2020-08-08 ENCOUNTER — Encounter: Payer: Self-pay | Admitting: Internal Medicine

## 2020-08-08 ENCOUNTER — Ambulatory Visit (INDEPENDENT_AMBULATORY_CARE_PROVIDER_SITE_OTHER): Payer: Medicare Other | Admitting: Internal Medicine

## 2020-08-08 VITALS — BP 104/72 | HR 91 | Ht 72.0 in | Wt 249.0 lb

## 2020-08-08 DIAGNOSIS — I2510 Atherosclerotic heart disease of native coronary artery without angina pectoris: Secondary | ICD-10-CM | POA: Diagnosis not present

## 2020-08-08 DIAGNOSIS — I10 Essential (primary) hypertension: Secondary | ICD-10-CM

## 2020-08-08 DIAGNOSIS — G459 Transient cerebral ischemic attack, unspecified: Secondary | ICD-10-CM | POA: Diagnosis not present

## 2020-08-08 DIAGNOSIS — I4819 Other persistent atrial fibrillation: Secondary | ICD-10-CM | POA: Diagnosis not present

## 2020-08-08 MED ORDER — APIXABAN 5 MG PO TABS: 5.0000 mg | ORAL_TABLET | Freq: Two times a day (BID) | ORAL | 11 refills | Status: DC

## 2020-08-08 NOTE — Patient Instructions (Addendum)
Medication Instructions:  STOP aspirin START eliquis 5mg  twice daily  Dr. Debara Pickett advised to use meloxicam very sparingly   *If you need a refill on your cardiac medications before your next appointment, please call your pharmacy*  Follow-Up: At [facility], you and your health needs are our priority.  As part of our continuing mission to provide you with exceptional heart care, we have created designated Provider Care Teams.  These Care Teams include your primary Cardiologist (physician) and Advanced Practice Providers (APPs -  Physician Assistants and Nurse Practitioners) who all work together to provide you with the care you need, when you need it.  We recommend signing up for the patient portal called "MyChart".  Sign up information is provided on this After Visit Summary.  MyChart is used to connect with patients for Virtual Visits (Telemedicine).  Patients are able to view lab/test results, encounter notes, upcoming appointments, etc.  Non-urgent messages can be sent to your provider as well.   To learn more about what you can do with MyChart, go to [email or web address].    Your next appointment:   2 month(s)  The format for your next appointment:   In Person  Provider:   You may see Pixie Casino, MD or one of the following Advanced Practice Providers on your designated Care Team:    Almyra Deforest, PA-C  Fabian Sharp, PA-C or   Roby Lofts, [place]    Other Instructions

## 2020-08-08 NOTE — Progress Notes (Signed)
OFFICE NOTE  Chief Complaint:  Follow-up ER visit  Primary Care Physician: [name], Jimmy Carr  HPI:  Jimmy Carr is a 79 year-old male with a history of PAF. His atrial fib actually started in the mid 80s. He was on Coumadin, beta blockers, and Lanoxin, and over the years, we have been able to wean him off his beta blockers. He is still on a low dose of Lanoxin and several years ago, we stopped the Coumadin, and his CHADS score at that time was 2, but with no atrial fibrillation in over 10 years, we stopped the Coumadin. He is on aspirin. He has had no recurrence of any atrial fib in over 15 years. He has no palpitations. No unusual shortness of breath. No chest pain. No exertional dyspnea. Minimal puffiness of his ankles at the end of the day. No dizziness, slurred speech, or blurred vision.  He is diabetic and has been so for over 30 years. His last hemoglobin A1c was 7.2. He now has a peripheral neuropathy that involves his feet, much greater than his hands. He had been tried on Neurontin with no real help. He checks his blood pressure at home. It has been under good control. He denies arthralgias or myalgias. He walks daily for at least 30 minutes, five to seven days a week. With this, he has no claudication, no unusual breathlessness. He has had no angina. He has diffuse T wave inversion on his EKG. He was catheterized in December 2009 that showed minimal coronary disease and normal LV function. He has no real complaints today, other than the increased cost of digoxin. Exactly unclear to me why he is on digoxin, as this medicine is unlikely to keep him out of atrial fibrillation. He does have a high CHADS2 score which is 4, despite in frequent atrial fibrillation. He continues on aspirin.  There are no new complaints of chest pain or worsening shortness of breath.  Jimmy Carr recently saw [name] in the office for what he thought was atypical chest pain. He recommended a nonsteroidal.  He is also reported more recent palpitations. He's been under significant stress recently. Today he called the office several times and spoke with our triage nurse about concern for atrial fibrillation. I did advise him that he may need to come in for an EKG to see if is actually in atrial fibrillation. Ultimately he did decide to do that.  Jimmy Carr returns today and is doing well. He is asymptomatic and has no more palpitations. We discussed to monitor his last visit if his symptoms did not improve but they seem to have improved. Overall he feels like he did well. He denies any chest pain. He reports good blood sugar control with his diabetes.  I saw Jimmy Carr back today in the office for follow-up. Overall he seems to be doing fairly well. He had recent laboratory work which shows an improvement in his lipid profile, however triglycerides remain elevated over 250. This is likely related to poorly controlled diabetes with an A1c of 7.8. He needs to continue to work on weight loss, exercise and reducing carbohydrates. He has not had any recurrent atrial fibrillation in over 15 years. Despite an elevated CHADSVASC score due to his low burden of A. fib he is only on aspirin therapy.  03/20/2017  Jimmy Carr returns today for follow-up. He denies any recurrent palpitations. He occasionally gets some lightheadedness. Recently he's had a feeling of discomfort in his stomach  that goes up to his chest. He says he feels hollow. He gets some lightheadedness and pain in the back of his neck. He describes it not like a headache but more of an aching sensation. He denies any exertional chest pain but has noticed more fatigue and dyspnea. EKG does not show atrial fibrillation today. He denies any hypoglycemia.  04/21/2018  Jimmy Carr returns today for follow-up.  Overall he is without complaints.  When I last saw him he was having some upper mid epigastric to lower central chest pain.  Ordered a stress test and a  repeat monitor for concern for possible A. fib.  He says his symptoms resolved spontaneously and he had neither test performed.  He says he is done well since that without any recurrent palpitations or chest pain.  In general he has had really good control over his blood sugars.  Recent hemoglobin A1c is 7 however on insulin and metformin.  Blood pressure is also well controlled 122/72.  LDL-C less than 70 on lovastatin.  EKG personally reviewed today shows sinus rhythm with nonspecific T wave changes at 76.  08/18/2019  Jimmy Carr is seen today as an add-on.  He was seen 2 days ago at [facility] in [place].  He had presented with symptoms concerning for TIA.  Cerebral imaging was performed including an echo which showed normal EF and negative bubble study.  He was noted on monitoring to have some brief episodes of irregular rhythm concerning for A. fib.  He does have a remote history of A. fib in the past.  It was felt this is the likely cause of his symptoms and the recommendation was to switch him to Eliquis.  In addition, he did have a lipid profile which showed a total cholesterol 114 and LDL 54.  A high potency statin was recommended and it was advised to switch to a atorvastatin from lovastatin.  04/06/2020  Jimmy Carr is seen today in follow-up.  He denies any recurrent TIAs.  Unfortunately after demonstrating what was felt to be atrial fibrillation on imaging studies I had recommended starting anticoagulation with Eliquis.  He decided that he was concerned too much about the risks of bleeding on that and is not currently taking it.  He remains only on low-dose aspirin.  He understands that I feel he is at increased risk for stroke and that his TIA may have been related to A. fib.  EKG today however does not show any atrial fibrillation.  08/08/2020  Jimmy Carr is seen today in follow-up.  He was just seen in the emergency department [facility] 2 days ago with paroxysmal atrial fibrillation.  He noted  when checking blood pressure and heart rate that his heart rate was elevated and irregular.  Otherwise he was not aware of any symptomatic A. fib.  According to the notes on presentation heart rate was in the 80s.  Blood pressure was also elevated at 282.  He was instructed to follow-up with me to discuss anticoagulation again.  Today we had a long discussion about anticoagulation and the risks and benefits of it.  He has concerns about the bleeding risks however understands that given his prior TIA, diabetes, hypertension and other risk factors that he has at high risk for stroke with atrial fibrillation.  He is again noted to be in A. fib today.  His CHA2DS2-VASc score is at least 6.  We again discussed anticoagulation with Eliquis and he is agreeable to proceed.  PMHx:  Past Medical History:  Diagnosis Date  . AF (atrial fibrillation) ([place])    2D ECHO, 03/01/1987 - normal; NUCLEAR STRESS TEST, 04/10/2006 - EF 65%, no ischemia  . Bruit    CAROTID DOPPLER, 09/20/2008 - normal, no evidence of diameter reduction, significant tortuousity or any other vascular abnormality  . Cerebrovascular accident [facility]) 2003   history of small vessal lacunar stroke 2003  . Diabetes mellitus type II   . Glaucoma   . Hyperlipidemia   . Hypertension   . 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 Peripheral neuropathy     Past Surgical History:  Procedure Laterality Date  . ANAL FISSURE REPAIR    . CARDIAC CATHETERIZATION  02/02/2001   Normal LV systolic function, questionable mitral valve prolapse without mitral regurgitation, no evidence of CAD  . CARDIAC CATHETERIZATION  09/13/2008   Minimal CAD, normal LV systolic function, no evidence of renal artery stenosis, distal aortic disease, or iliac disease    FAMHx:  Family History  Problem Relation Age of Onset  . Diabetes Neg Hx   . Colon cancer Neg Hx     SOCHx:   reports that he has never smoked. He has never used smokeless tobacco. He  reports that he does not drink alcohol and does not use drugs.  ALLERGIES:  Allergies  Allergen Reactions  . Cheratussin Ac [Guaifenesin-Codeine] Nausea And Vomiting  . Verapamil     ROS: Pertinent items noted in HPI and remainder of comprehensive ROS otherwise negative.  HOME MEDS: Current Outpatient Medications  Medication Sig Dispense Refill  . aspirin 81 MG chewable tablet Chew by mouth daily.     Marland Kitchen HUMALOG KWIKPEN 100 UNIT/ML KwikPen INJECT 50 UNITS UNDER THE SKIN AT BREAKFAST, 35 UNITS WITH LUNCH AND 50 UNITS WITH SUPPER 120 mL 0  . hydrochlorothiazide (HYDRODIURIL) 25 MG tablet TAKE 1 TABLET(25 MG) BY MOUTH DAILY 90 tablet 0  . insulin lispro (HUMALOG) 100 UNIT/ML injection Inject 40 Units into the skin 3 (three) times daily before meals.     . Insulin Pen Needle (B-D ULTRAFINE III SHORT PEN) 31G X 8 MM MISC USE FOUR TIMES DAILY BEFORE MEALS AND AT BEDTIME 200 each 5  . LANTUS SOLOSTAR 100 UNIT/ML Solostar Pen ADMINISTER 60 UNITS UNDER THE SKIN AT 10 PM 15 mL 2  . latanoprost (XALATAN) 0.005 % ophthalmic solution 1 drop daily.     Marland Kitchen lisinopril (ZESTRIL) 40 MG tablet TAKE 1 TABLET(40 MG) BY MOUTH DAILY 90 tablet 0  . lovastatin (MEVACOR) 40 MG tablet Take 1 tablet (40 mg total) by mouth at bedtime. 90 tablet 3  . meloxicam (MOBIC) 7.5 MG tablet TAKE 1 TABLET(7.5 MG) BY MOUTH DAILY 30 tablet 0  . metFORMIN (GLUCOPHAGE) 1000 MG tablet TAKE 1 TABLET(1000 MG) BY MOUTH TWICE DAILY WITH A MEAL 180 tablet 0  . nystatin (MYCOSTATIN/NYSTOP) powder Apply 1 application topically 3 (three) times daily. 15 g 0  . PRODIGY NO CODING BLOOD GLUC test strip USE THREE TIMES DAILY AS DIRECTED 300 strip 0  . Prodigy Twist Top Lancets 28G MISC TEST BLOOD SUGAR 3 TIMES DAILY AS DIRECTED 100 each 1  . timolol (TIMOPTIC-XR) 0.5 % ophthalmic gel-forming Place 1 drop into both eyes daily.     . Blood Glucose Monitoring Suppl (ACCU-CHEK AVIVA PLUS) w/Device KIT USED TO CHECK BLOOD SUGAR 3 TIMES DAILY OR PRN.  (Patient not taking: Reported on 08/08/2020) 1 kit 0   Current Facility-Administered Medications  Medication Dose Route Frequency  Provider Last Rate Last Admin  . 0.9 %  sodium chloride infusion  500 mL Intravenous Continuous Ladene Artist, Jimmy Carr        LABS/IMAGING: No results found for this or any previous visit (from the past 48 hour(s)). No results found.  VITALS: BP 104/72   Pulse 91   Ht 6' (1.829 m)   Wt 249 lb (112.9 kg)   SpO2 98%   BMI 33.77 kg/m   EXAM: General appearance: alert and no distress Neck: no carotid bruit and no JVD Lungs: clear to auscultation bilaterally Heart: regular rate and rhythm, S1, S2 normal, no murmur, click, rub or gallop Abdomen: soft, non-tender; bowel sounds normal; no masses,  no organomegaly Extremities: extremities normal, atraumatic, no cyanosis or edema Pulses: 2+ and symmetric Skin: Skin color, texture, turgor normal. No rashes or lesions Neurologic: Grossly normal Psych: Normal  EKG: Atrial fibrillation with low voltage QRS at 91-personally reviewed  ASSESSMENT: 1. Persistent atrial fibrillation, CHADSVASC score of 6 2. Recent TIA 3. Hypertension 4. History of stroke, with right central vision loss 5. Type 2 diabetes on insulin 6. Dyslipidemia 7. Sphenoid mass  PLAN: 1.   Jimmy Carr recently presented the emergency department because of noted irregularity in his heart rate which was elevated however he seemed to be rate controlled there but it was noted to be in A. fib.  He is again in A. fib today suggesting it may be more persistent at this point.  We again discussed anticoagulation and went into detailed evaluation of risk versus benefit regarding bleeding versus stroke risk reduction.  Given his high CHA2DS2-VASc score, I would highly recommend anticoagulation.  He is now agreeable and we will plan to start Eliquis 5 mg twice daily (age less than 25, weight greater than 60 kg, creatinine less than 1.5).  Stop aspirin.  I  advised using meloxicam sparingly as it may contribute to bleeding risk.  Follow-up with me in 2 months or sooner as necessary.  Jimmy Casino, Jimmy Carr, Jimmy Carr, Jimmy Carr Director of the [facility] Diplomate of the American Board of Clinical Lipidology Attending Cardiologist  Direct Dial: [phone]  Fax: [phone]  Website:  [email or web address] 08/08/2020, 11:36 AM

## 2020-08-09 ENCOUNTER — Ambulatory Visit: Payer: Medicare Other | Admitting: Internal Medicine

## 2020-08-10 ENCOUNTER — Other Ambulatory Visit: Payer: Self-pay | Admitting: Family Medicine

## 2020-08-25 ENCOUNTER — Other Ambulatory Visit: Payer: Self-pay | Admitting: Family Medicine

## 2020-08-25 DIAGNOSIS — I10 Essential (primary) hypertension: Secondary | ICD-10-CM

## 2020-08-25 DIAGNOSIS — E7849 Other hyperlipidemia: Secondary | ICD-10-CM

## 2020-08-31 NOTE — Telephone Encounter (Signed)
Pt is calling in stating that he needs a refill on his Rx lovastatin (MEVACOR) 40 MG due to him being out.  Pharm:  [facility] in [place]. [address] [facility]

## 2020-09-07 ENCOUNTER — Telehealth: Payer: Self-pay | Admitting: Family Medicine

## 2020-09-07 NOTE — Telephone Encounter (Signed)
Left message for patient to call back and schedule Medicare Annual Wellness Visit (AWV) either virtually or in office.   Last AWV 12/31/17  please schedule at anytime with [facility] Nurse Health Advisor 1 or 2   This should be a 45 minute visit.  

## 2020-09-11 ENCOUNTER — Other Ambulatory Visit: Payer: Self-pay | Admitting: Family Medicine

## 2020-09-28 ENCOUNTER — Other Ambulatory Visit: Payer: Self-pay | Admitting: Family Medicine

## 2020-10-05 DIAGNOSIS — Z23 Encounter for immunization: Secondary | ICD-10-CM | POA: Diagnosis not present

## 2020-10-23 ENCOUNTER — Other Ambulatory Visit: Payer: Self-pay | Admitting: Family Medicine

## 2020-10-23 DIAGNOSIS — Z794 Long term (current) use of insulin: Secondary | ICD-10-CM

## 2020-10-23 DIAGNOSIS — E0840 Diabetes mellitus due to underlying condition with diabetic neuropathy, unspecified: Secondary | ICD-10-CM

## 2020-10-25 ENCOUNTER — Other Ambulatory Visit: Payer: Self-pay | Admitting: Family Medicine

## 2020-10-25 ENCOUNTER — Other Ambulatory Visit: Payer: Self-pay

## 2020-10-30 ENCOUNTER — Ambulatory Visit: Payer: Medicare Other | Admitting: Internal Medicine

## 2020-11-08 ENCOUNTER — Other Ambulatory Visit: Payer: Self-pay | Admitting: Family Medicine

## 2020-11-27 ENCOUNTER — Other Ambulatory Visit: Payer: Self-pay | Admitting: Family Medicine

## 2020-11-27 DIAGNOSIS — I10 Essential (primary) hypertension: Secondary | ICD-10-CM

## 2020-11-29 ENCOUNTER — Telehealth: Payer: Self-pay | Admitting: Family Medicine

## 2020-11-29 NOTE — Telephone Encounter (Signed)
Left message for patient to call back and schedule Medicare Annual Wellness Visit (AWV) either virtually or in office. No detailed message left    Last AWVs 12/31/17  please schedule at anytime with [facility] Nurse Health Advisor 1 or 2   This should be a 45 minute visit.

## 2020-12-03 ENCOUNTER — Other Ambulatory Visit: Payer: Self-pay | Admitting: Family Medicine

## 2020-12-23 ENCOUNTER — Other Ambulatory Visit: Payer: Self-pay | Admitting: Family Medicine

## 2020-12-27 ENCOUNTER — Other Ambulatory Visit: Payer: Self-pay | Admitting: Family Medicine

## 2021-01-18 ENCOUNTER — Other Ambulatory Visit: Payer: Self-pay | Admitting: Family Medicine

## 2021-02-07 ENCOUNTER — Other Ambulatory Visit: Payer: Self-pay | Admitting: Family Medicine

## 2021-02-10 ENCOUNTER — Other Ambulatory Visit: Payer: Self-pay | Admitting: Family Medicine

## 2021-02-23 ENCOUNTER — Telehealth: Payer: Self-pay | Admitting: Family Medicine

## 2021-02-23 NOTE — Telephone Encounter (Signed)
Left message for patient to call back and schedule Medicare Annual Wellness Visit (AWV) either virtually or in office.   Last AWV 12/31/17 please schedule at anytime with [facility] Nurse Health Advisor 1 or 2   This should be a 45 minute visit.

## 2021-03-03 ENCOUNTER — Other Ambulatory Visit: Payer: Self-pay | Admitting: Family Medicine

## 2021-03-03 DIAGNOSIS — I10 Essential (primary) hypertension: Secondary | ICD-10-CM

## 2021-03-12 ENCOUNTER — Other Ambulatory Visit: Payer: Self-pay | Admitting: Family Medicine

## 2021-04-04 ENCOUNTER — Other Ambulatory Visit: Payer: Self-pay | Admitting: Family Medicine

## 2021-04-05 ENCOUNTER — Other Ambulatory Visit: Payer: Self-pay | Admitting: Family Medicine

## 2021-04-09 ENCOUNTER — Telehealth: Payer: Self-pay | Admitting: Family Medicine

## 2021-04-09 NOTE — Telephone Encounter (Signed)
Left message for patient to call back and schedule Medicare Annual Wellness Visit (AWV) either virtually or in office.   Last AWV 12/31/17  please schedule at anytime with [facility] Nurse Health Advisor 1 or 2   This should be a 45 minute visit.

## 2021-05-05 DIAGNOSIS — M47816 Spondylosis without myelopathy or radiculopathy, lumbar region: Secondary | ICD-10-CM | POA: Diagnosis not present

## 2021-05-05 DIAGNOSIS — R29818 Other symptoms and signs involving the nervous system: Secondary | ICD-10-CM | POA: Diagnosis not present

## 2021-05-05 DIAGNOSIS — M79662 Pain in left lower leg: Secondary | ICD-10-CM | POA: Diagnosis not present

## 2021-05-05 DIAGNOSIS — M545 Low back pain, unspecified: Secondary | ICD-10-CM | POA: Diagnosis not present

## 2021-05-05 DIAGNOSIS — I639 Cerebral infarction, unspecified: Secondary | ICD-10-CM | POA: Diagnosis not present

## 2021-05-05 DIAGNOSIS — I10 Essential (primary) hypertension: Secondary | ICD-10-CM | POA: Diagnosis not present

## 2021-05-05 DIAGNOSIS — I6782 Cerebral ischemia: Secondary | ICD-10-CM | POA: Diagnosis not present

## 2021-05-05 DIAGNOSIS — Z794 Long term (current) use of insulin: Secondary | ICD-10-CM | POA: Diagnosis not present

## 2021-05-05 DIAGNOSIS — I480 Paroxysmal atrial fibrillation: Secondary | ICD-10-CM | POA: Diagnosis not present

## 2021-05-05 DIAGNOSIS — N179 Acute kidney failure, unspecified: Secondary | ICD-10-CM | POA: Diagnosis not present

## 2021-05-05 DIAGNOSIS — R200 Anesthesia of skin: Secondary | ICD-10-CM | POA: Diagnosis not present

## 2021-05-05 DIAGNOSIS — B370 Candidal stomatitis: Secondary | ICD-10-CM | POA: Diagnosis not present

## 2021-05-05 DIAGNOSIS — E785 Hyperlipidemia, unspecified: Secondary | ICD-10-CM | POA: Diagnosis not present

## 2021-05-05 DIAGNOSIS — I69354 Hemiplegia and hemiparesis following cerebral infarction affecting left non-dominant side: Secondary | ICD-10-CM | POA: Diagnosis not present

## 2021-05-05 DIAGNOSIS — M79661 Pain in right lower leg: Secondary | ICD-10-CM | POA: Diagnosis not present

## 2021-05-05 DIAGNOSIS — E119 Type 2 diabetes mellitus without complications: Secondary | ICD-10-CM | POA: Diagnosis not present

## 2021-05-05 DIAGNOSIS — G459 Transient cerebral ischemic attack, unspecified: Secondary | ICD-10-CM | POA: Diagnosis not present

## 2021-05-05 DIAGNOSIS — Z8673 Personal history of transient ischemic attack (TIA), and cerebral infarction without residual deficits: Secondary | ICD-10-CM | POA: Diagnosis not present

## 2021-05-05 DIAGNOSIS — I6389 Other cerebral infarction: Secondary | ICD-10-CM | POA: Diagnosis not present

## 2021-05-06 ENCOUNTER — Other Ambulatory Visit: Payer: Self-pay | Admitting: Family Medicine

## 2021-05-06 DIAGNOSIS — R200 Anesthesia of skin: Secondary | ICD-10-CM | POA: Diagnosis present

## 2021-05-06 DIAGNOSIS — M25512 Pain in left shoulder: Secondary | ICD-10-CM | POA: Diagnosis present

## 2021-05-06 DIAGNOSIS — E785 Hyperlipidemia, unspecified: Secondary | ICD-10-CM | POA: Diagnosis present

## 2021-05-06 DIAGNOSIS — N179 Acute kidney failure, unspecified: Secondary | ICD-10-CM | POA: Diagnosis present

## 2021-05-06 DIAGNOSIS — G8190 Hemiplegia, unspecified affecting unspecified side: Secondary | ICD-10-CM | POA: Diagnosis not present

## 2021-05-06 DIAGNOSIS — T45516A Underdosing of anticoagulants, initial encounter: Secondary | ICD-10-CM | POA: Diagnosis present

## 2021-05-06 DIAGNOSIS — Z7984 Long term (current) use of oral hypoglycemic drugs: Secondary | ICD-10-CM | POA: Diagnosis not present

## 2021-05-06 DIAGNOSIS — R29810 Facial weakness: Secondary | ICD-10-CM | POA: Diagnosis present

## 2021-05-06 DIAGNOSIS — I10 Essential (primary) hypertension: Secondary | ICD-10-CM | POA: Diagnosis present

## 2021-05-06 DIAGNOSIS — R4781 Slurred speech: Secondary | ICD-10-CM | POA: Diagnosis present

## 2021-05-06 DIAGNOSIS — I6389 Other cerebral infarction: Secondary | ICD-10-CM | POA: Diagnosis present

## 2021-05-06 DIAGNOSIS — I6602 Occlusion and stenosis of left middle cerebral artery: Secondary | ICD-10-CM | POA: Diagnosis present

## 2021-05-06 DIAGNOSIS — G459 Transient cerebral ischemic attack, unspecified: Secondary | ICD-10-CM | POA: Diagnosis not present

## 2021-05-06 DIAGNOSIS — I639 Cerebral infarction, unspecified: Secondary | ICD-10-CM | POA: Diagnosis not present

## 2021-05-06 DIAGNOSIS — Z794 Long term (current) use of insulin: Secondary | ICD-10-CM | POA: Diagnosis not present

## 2021-05-06 DIAGNOSIS — I6622 Occlusion and stenosis of left posterior cerebral artery: Secondary | ICD-10-CM | POA: Diagnosis present

## 2021-05-06 DIAGNOSIS — F40240 Claustrophobia: Secondary | ICD-10-CM | POA: Diagnosis present

## 2021-05-06 DIAGNOSIS — Z5329 Procedure and treatment not carried out because of patient's decision for other reasons: Secondary | ICD-10-CM | POA: Diagnosis present

## 2021-05-06 DIAGNOSIS — M545 Low back pain, unspecified: Secondary | ICD-10-CM | POA: Diagnosis not present

## 2021-05-06 DIAGNOSIS — Z91128 Patient's intentional underdosing of medication regimen for other reason: Secondary | ICD-10-CM | POA: Diagnosis not present

## 2021-05-06 DIAGNOSIS — E119 Type 2 diabetes mellitus without complications: Secondary | ICD-10-CM | POA: Diagnosis present

## 2021-05-06 DIAGNOSIS — R29700 NIHSS score 0: Secondary | ICD-10-CM | POA: Diagnosis present

## 2021-05-06 DIAGNOSIS — R471 Dysarthria and anarthria: Secondary | ICD-10-CM | POA: Diagnosis present

## 2021-05-06 DIAGNOSIS — I480 Paroxysmal atrial fibrillation: Secondary | ICD-10-CM | POA: Diagnosis present

## 2021-05-06 DIAGNOSIS — I517 Cardiomegaly: Secondary | ICD-10-CM | POA: Diagnosis not present

## 2021-05-06 DIAGNOSIS — Z7901 Long term (current) use of anticoagulants: Secondary | ICD-10-CM | POA: Diagnosis not present

## 2021-05-06 DIAGNOSIS — R29703 NIHSS score 3: Secondary | ICD-10-CM | POA: Diagnosis not present

## 2021-05-06 DIAGNOSIS — R29708 NIHSS score 8: Secondary | ICD-10-CM | POA: Diagnosis not present

## 2021-05-06 DIAGNOSIS — Z8673 Personal history of transient ischemic attack (TIA), and cerebral infarction without residual deficits: Secondary | ICD-10-CM | POA: Diagnosis not present

## 2021-05-06 DIAGNOSIS — B370 Candidal stomatitis: Secondary | ICD-10-CM | POA: Diagnosis present

## 2021-05-06 DIAGNOSIS — I69354 Hemiplegia and hemiparesis following cerebral infarction affecting left non-dominant side: Secondary | ICD-10-CM | POA: Diagnosis not present

## 2021-05-06 DIAGNOSIS — E1169 Type 2 diabetes mellitus with other specified complication: Secondary | ICD-10-CM | POA: Diagnosis not present

## 2021-05-08 ENCOUNTER — Other Ambulatory Visit: Payer: Self-pay | Admitting: Family Medicine

## 2021-05-09 ENCOUNTER — Ambulatory Visit: Payer: Medicare Other

## 2021-05-09 DIAGNOSIS — I69321 Dysphasia following cerebral infarction: Secondary | ICD-10-CM | POA: Diagnosis not present

## 2021-05-09 DIAGNOSIS — I639 Cerebral infarction, unspecified: Secondary | ICD-10-CM | POA: Diagnosis not present

## 2021-05-09 DIAGNOSIS — H409 Unspecified glaucoma: Secondary | ICD-10-CM | POA: Diagnosis not present

## 2021-05-09 DIAGNOSIS — I69392 Facial weakness following cerebral infarction: Secondary | ICD-10-CM | POA: Diagnosis not present

## 2021-05-09 DIAGNOSIS — G459 Transient cerebral ischemic attack, unspecified: Secondary | ICD-10-CM | POA: Diagnosis not present

## 2021-05-09 DIAGNOSIS — E1159 Type 2 diabetes mellitus with other circulatory complications: Secondary | ICD-10-CM | POA: Diagnosis not present

## 2021-05-09 DIAGNOSIS — Z20822 Contact with and (suspected) exposure to covid-19: Secondary | ICD-10-CM | POA: Diagnosis not present

## 2021-05-09 DIAGNOSIS — R4702 Dysphasia: Secondary | ICD-10-CM | POA: Diagnosis not present

## 2021-05-09 DIAGNOSIS — R252 Cramp and spasm: Secondary | ICD-10-CM | POA: Diagnosis not present

## 2021-05-09 DIAGNOSIS — Z9181 History of falling: Secondary | ICD-10-CM | POA: Diagnosis not present

## 2021-05-09 DIAGNOSIS — M25569 Pain in unspecified knee: Secondary | ICD-10-CM | POA: Diagnosis not present

## 2021-05-09 DIAGNOSIS — Z8673 Personal history of transient ischemic attack (TIA), and cerebral infarction without residual deficits: Secondary | ICD-10-CM | POA: Diagnosis not present

## 2021-05-09 DIAGNOSIS — I69322 Dysarthria following cerebral infarction: Secondary | ICD-10-CM | POA: Diagnosis not present

## 2021-05-09 DIAGNOSIS — Z743 Need for continuous supervision: Secondary | ICD-10-CM | POA: Diagnosis not present

## 2021-05-09 DIAGNOSIS — I480 Paroxysmal atrial fibrillation: Secondary | ICD-10-CM | POA: Diagnosis not present

## 2021-05-09 DIAGNOSIS — M25562 Pain in left knee: Secondary | ICD-10-CM | POA: Diagnosis not present

## 2021-05-09 DIAGNOSIS — M25462 Effusion, left knee: Secondary | ICD-10-CM | POA: Diagnosis not present

## 2021-05-09 DIAGNOSIS — I69398 Other sequelae of cerebral infarction: Secondary | ICD-10-CM | POA: Diagnosis not present

## 2021-05-09 DIAGNOSIS — K5900 Constipation, unspecified: Secondary | ICD-10-CM | POA: Diagnosis not present

## 2021-05-09 DIAGNOSIS — M1712 Unilateral primary osteoarthritis, left knee: Secondary | ICD-10-CM | POA: Diagnosis not present

## 2021-05-09 DIAGNOSIS — Z7901 Long term (current) use of anticoagulants: Secondary | ICD-10-CM | POA: Diagnosis not present

## 2021-05-09 DIAGNOSIS — Z7409 Other reduced mobility: Secondary | ICD-10-CM | POA: Diagnosis not present

## 2021-05-09 DIAGNOSIS — M545 Low back pain, unspecified: Secondary | ICD-10-CM | POA: Diagnosis not present

## 2021-05-09 DIAGNOSIS — I69320 Aphasia following cerebral infarction: Secondary | ICD-10-CM | POA: Diagnosis not present

## 2021-05-09 DIAGNOSIS — Z794 Long term (current) use of insulin: Secondary | ICD-10-CM | POA: Diagnosis not present

## 2021-05-09 DIAGNOSIS — E1165 Type 2 diabetes mellitus with hyperglycemia: Secondary | ICD-10-CM | POA: Diagnosis not present

## 2021-05-09 DIAGNOSIS — I10 Essential (primary) hypertension: Secondary | ICD-10-CM | POA: Diagnosis not present

## 2021-05-09 DIAGNOSIS — S76112A Strain of left quadriceps muscle, fascia and tendon, initial encounter: Secondary | ICD-10-CM | POA: Diagnosis not present

## 2021-05-09 DIAGNOSIS — G8929 Other chronic pain: Secondary | ICD-10-CM | POA: Diagnosis not present

## 2021-05-09 DIAGNOSIS — R2689 Other abnormalities of gait and mobility: Secondary | ICD-10-CM | POA: Diagnosis not present

## 2021-05-09 DIAGNOSIS — R1310 Dysphagia, unspecified: Secondary | ICD-10-CM | POA: Diagnosis not present

## 2021-05-09 DIAGNOSIS — E1169 Type 2 diabetes mellitus with other specified complication: Secondary | ICD-10-CM | POA: Diagnosis not present

## 2021-05-09 DIAGNOSIS — E785 Hyperlipidemia, unspecified: Secondary | ICD-10-CM | POA: Diagnosis not present

## 2021-05-09 DIAGNOSIS — M94262 Chondromalacia, left knee: Secondary | ICD-10-CM | POA: Diagnosis not present

## 2021-05-09 DIAGNOSIS — I69354 Hemiplegia and hemiparesis following cerebral infarction affecting left non-dominant side: Secondary | ICD-10-CM | POA: Diagnosis not present

## 2021-05-09 DIAGNOSIS — S43002A Unspecified subluxation of left shoulder joint, initial encounter: Secondary | ICD-10-CM | POA: Diagnosis not present

## 2021-05-09 DIAGNOSIS — J029 Acute pharyngitis, unspecified: Secondary | ICD-10-CM | POA: Diagnosis not present

## 2021-05-09 DIAGNOSIS — N390 Urinary tract infection, site not specified: Secondary | ICD-10-CM | POA: Diagnosis not present

## 2021-05-09 DIAGNOSIS — E119 Type 2 diabetes mellitus without complications: Secondary | ICD-10-CM | POA: Diagnosis not present

## 2021-05-09 DIAGNOSIS — R5383 Other fatigue: Secondary | ICD-10-CM | POA: Diagnosis not present

## 2021-05-09 DIAGNOSIS — R1311 Dysphagia, oral phase: Secondary | ICD-10-CM | POA: Diagnosis not present

## 2021-05-10 DIAGNOSIS — Z794 Long term (current) use of insulin: Secondary | ICD-10-CM | POA: Diagnosis not present

## 2021-05-10 DIAGNOSIS — R1311 Dysphagia, oral phase: Secondary | ICD-10-CM | POA: Diagnosis not present

## 2021-05-10 DIAGNOSIS — M545 Low back pain, unspecified: Secondary | ICD-10-CM | POA: Diagnosis not present

## 2021-05-10 DIAGNOSIS — Z7409 Other reduced mobility: Secondary | ICD-10-CM | POA: Diagnosis not present

## 2021-05-10 DIAGNOSIS — I639 Cerebral infarction, unspecified: Secondary | ICD-10-CM | POA: Diagnosis not present

## 2021-05-10 DIAGNOSIS — E1165 Type 2 diabetes mellitus with hyperglycemia: Secondary | ICD-10-CM | POA: Diagnosis not present

## 2021-05-10 DIAGNOSIS — Z7901 Long term (current) use of anticoagulants: Secondary | ICD-10-CM | POA: Diagnosis not present

## 2021-05-10 DIAGNOSIS — E785 Hyperlipidemia, unspecified: Secondary | ICD-10-CM | POA: Diagnosis not present

## 2021-05-10 DIAGNOSIS — I10 Essential (primary) hypertension: Secondary | ICD-10-CM | POA: Diagnosis not present

## 2021-05-10 DIAGNOSIS — R4702 Dysphasia: Secondary | ICD-10-CM | POA: Diagnosis not present

## 2021-05-10 DIAGNOSIS — I480 Paroxysmal atrial fibrillation: Secondary | ICD-10-CM | POA: Diagnosis not present

## 2021-05-11 DIAGNOSIS — Z7901 Long term (current) use of anticoagulants: Secondary | ICD-10-CM | POA: Diagnosis not present

## 2021-05-11 DIAGNOSIS — R4702 Dysphasia: Secondary | ICD-10-CM | POA: Diagnosis not present

## 2021-05-11 DIAGNOSIS — I10 Essential (primary) hypertension: Secondary | ICD-10-CM | POA: Diagnosis not present

## 2021-05-11 DIAGNOSIS — E785 Hyperlipidemia, unspecified: Secondary | ICD-10-CM | POA: Diagnosis not present

## 2021-05-11 DIAGNOSIS — Z794 Long term (current) use of insulin: Secondary | ICD-10-CM | POA: Diagnosis not present

## 2021-05-11 DIAGNOSIS — E1169 Type 2 diabetes mellitus with other specified complication: Secondary | ICD-10-CM | POA: Diagnosis not present

## 2021-05-11 DIAGNOSIS — I639 Cerebral infarction, unspecified: Secondary | ICD-10-CM | POA: Diagnosis not present

## 2021-05-11 DIAGNOSIS — I480 Paroxysmal atrial fibrillation: Secondary | ICD-10-CM | POA: Diagnosis not present

## 2021-05-11 DIAGNOSIS — R1310 Dysphagia, unspecified: Secondary | ICD-10-CM | POA: Diagnosis not present

## 2021-05-12 DIAGNOSIS — E1169 Type 2 diabetes mellitus with other specified complication: Secondary | ICD-10-CM | POA: Diagnosis not present

## 2021-05-12 DIAGNOSIS — I639 Cerebral infarction, unspecified: Secondary | ICD-10-CM | POA: Diagnosis not present

## 2021-05-12 DIAGNOSIS — R4702 Dysphasia: Secondary | ICD-10-CM | POA: Diagnosis not present

## 2021-05-12 DIAGNOSIS — I10 Essential (primary) hypertension: Secondary | ICD-10-CM | POA: Diagnosis not present

## 2021-05-12 DIAGNOSIS — Z794 Long term (current) use of insulin: Secondary | ICD-10-CM | POA: Diagnosis not present

## 2021-05-12 DIAGNOSIS — Z7901 Long term (current) use of anticoagulants: Secondary | ICD-10-CM | POA: Diagnosis not present

## 2021-05-12 DIAGNOSIS — E785 Hyperlipidemia, unspecified: Secondary | ICD-10-CM | POA: Diagnosis not present

## 2021-05-12 DIAGNOSIS — I480 Paroxysmal atrial fibrillation: Secondary | ICD-10-CM | POA: Diagnosis not present

## 2021-05-13 DIAGNOSIS — E785 Hyperlipidemia, unspecified: Secondary | ICD-10-CM | POA: Diagnosis not present

## 2021-05-13 DIAGNOSIS — I639 Cerebral infarction, unspecified: Secondary | ICD-10-CM | POA: Diagnosis not present

## 2021-05-13 DIAGNOSIS — I10 Essential (primary) hypertension: Secondary | ICD-10-CM | POA: Diagnosis not present

## 2021-05-13 DIAGNOSIS — R4702 Dysphasia: Secondary | ICD-10-CM | POA: Diagnosis not present

## 2021-05-13 DIAGNOSIS — E1165 Type 2 diabetes mellitus with hyperglycemia: Secondary | ICD-10-CM | POA: Diagnosis not present

## 2021-05-13 DIAGNOSIS — Z794 Long term (current) use of insulin: Secondary | ICD-10-CM | POA: Diagnosis not present

## 2021-05-13 DIAGNOSIS — N390 Urinary tract infection, site not specified: Secondary | ICD-10-CM | POA: Diagnosis not present

## 2021-05-13 DIAGNOSIS — Z7901 Long term (current) use of anticoagulants: Secondary | ICD-10-CM | POA: Diagnosis not present

## 2021-05-14 DIAGNOSIS — R1311 Dysphagia, oral phase: Secondary | ICD-10-CM | POA: Diagnosis not present

## 2021-05-14 DIAGNOSIS — I480 Paroxysmal atrial fibrillation: Secondary | ICD-10-CM | POA: Diagnosis not present

## 2021-05-14 DIAGNOSIS — M545 Low back pain, unspecified: Secondary | ICD-10-CM | POA: Diagnosis not present

## 2021-05-14 DIAGNOSIS — I10 Essential (primary) hypertension: Secondary | ICD-10-CM | POA: Diagnosis not present

## 2021-05-14 DIAGNOSIS — Z7409 Other reduced mobility: Secondary | ICD-10-CM | POA: Diagnosis not present

## 2021-05-15 DIAGNOSIS — M545 Low back pain, unspecified: Secondary | ICD-10-CM | POA: Diagnosis not present

## 2021-05-15 DIAGNOSIS — I480 Paroxysmal atrial fibrillation: Secondary | ICD-10-CM | POA: Diagnosis not present

## 2021-05-15 DIAGNOSIS — I10 Essential (primary) hypertension: Secondary | ICD-10-CM | POA: Diagnosis not present

## 2021-05-15 DIAGNOSIS — R1311 Dysphagia, oral phase: Secondary | ICD-10-CM | POA: Diagnosis not present

## 2021-05-15 DIAGNOSIS — Z7409 Other reduced mobility: Secondary | ICD-10-CM | POA: Diagnosis not present

## 2021-05-16 DIAGNOSIS — I10 Essential (primary) hypertension: Secondary | ICD-10-CM | POA: Diagnosis not present

## 2021-05-16 DIAGNOSIS — M545 Low back pain, unspecified: Secondary | ICD-10-CM | POA: Diagnosis not present

## 2021-05-16 DIAGNOSIS — Z7409 Other reduced mobility: Secondary | ICD-10-CM | POA: Diagnosis not present

## 2021-05-16 DIAGNOSIS — R1311 Dysphagia, oral phase: Secondary | ICD-10-CM | POA: Diagnosis not present

## 2021-05-16 DIAGNOSIS — I480 Paroxysmal atrial fibrillation: Secondary | ICD-10-CM | POA: Diagnosis not present

## 2021-05-17 DIAGNOSIS — Z7409 Other reduced mobility: Secondary | ICD-10-CM | POA: Diagnosis not present

## 2021-05-17 DIAGNOSIS — I480 Paroxysmal atrial fibrillation: Secondary | ICD-10-CM | POA: Diagnosis not present

## 2021-05-17 DIAGNOSIS — M545 Low back pain, unspecified: Secondary | ICD-10-CM | POA: Diagnosis not present

## 2021-05-17 DIAGNOSIS — I10 Essential (primary) hypertension: Secondary | ICD-10-CM | POA: Diagnosis not present

## 2021-05-17 DIAGNOSIS — R1311 Dysphagia, oral phase: Secondary | ICD-10-CM | POA: Diagnosis not present

## 2021-05-18 DIAGNOSIS — M545 Low back pain, unspecified: Secondary | ICD-10-CM | POA: Diagnosis not present

## 2021-05-18 DIAGNOSIS — I480 Paroxysmal atrial fibrillation: Secondary | ICD-10-CM | POA: Diagnosis not present

## 2021-05-18 DIAGNOSIS — I10 Essential (primary) hypertension: Secondary | ICD-10-CM | POA: Diagnosis not present

## 2021-05-18 DIAGNOSIS — Z7409 Other reduced mobility: Secondary | ICD-10-CM | POA: Diagnosis not present

## 2021-05-18 DIAGNOSIS — R1311 Dysphagia, oral phase: Secondary | ICD-10-CM | POA: Diagnosis not present

## 2021-05-19 DIAGNOSIS — I480 Paroxysmal atrial fibrillation: Secondary | ICD-10-CM | POA: Diagnosis not present

## 2021-05-19 DIAGNOSIS — M545 Low back pain, unspecified: Secondary | ICD-10-CM | POA: Diagnosis not present

## 2021-05-19 DIAGNOSIS — R1311 Dysphagia, oral phase: Secondary | ICD-10-CM | POA: Diagnosis not present

## 2021-05-19 DIAGNOSIS — Z7409 Other reduced mobility: Secondary | ICD-10-CM | POA: Diagnosis not present

## 2021-05-19 DIAGNOSIS — I10 Essential (primary) hypertension: Secondary | ICD-10-CM | POA: Diagnosis not present

## 2021-05-23 DIAGNOSIS — M25462 Effusion, left knee: Secondary | ICD-10-CM | POA: Diagnosis not present

## 2021-05-23 DIAGNOSIS — M25562 Pain in left knee: Secondary | ICD-10-CM | POA: Diagnosis not present

## 2021-05-24 ENCOUNTER — Other Ambulatory Visit: Payer: Self-pay | Admitting: Family Medicine

## 2021-05-28 DIAGNOSIS — M1712 Unilateral primary osteoarthritis, left knee: Secondary | ICD-10-CM | POA: Diagnosis not present

## 2021-05-28 DIAGNOSIS — M25562 Pain in left knee: Secondary | ICD-10-CM | POA: Diagnosis not present

## 2021-05-28 DIAGNOSIS — Z9181 History of falling: Secondary | ICD-10-CM | POA: Diagnosis not present

## 2021-06-01 ENCOUNTER — Inpatient Hospital Stay: Payer: Medicare Other | Admitting: Family Medicine

## 2021-06-12 ENCOUNTER — Other Ambulatory Visit: Payer: Self-pay | Admitting: Family Medicine

## 2021-06-12 DIAGNOSIS — I10 Essential (primary) hypertension: Secondary | ICD-10-CM

## 2021-06-13 ENCOUNTER — Other Ambulatory Visit: Payer: Self-pay | Admitting: Family Medicine

## 2021-06-13 DIAGNOSIS — E119 Type 2 diabetes mellitus without complications: Secondary | ICD-10-CM | POA: Diagnosis not present

## 2021-06-13 DIAGNOSIS — I480 Paroxysmal atrial fibrillation: Secondary | ICD-10-CM | POA: Diagnosis not present

## 2021-06-13 DIAGNOSIS — R471 Dysarthria and anarthria: Secondary | ICD-10-CM | POA: Diagnosis not present

## 2021-06-13 DIAGNOSIS — R269 Unspecified abnormalities of gait and mobility: Secondary | ICD-10-CM | POA: Diagnosis not present

## 2021-06-13 DIAGNOSIS — I10 Essential (primary) hypertension: Secondary | ICD-10-CM

## 2021-06-13 DIAGNOSIS — I69354 Hemiplegia and hemiparesis following cerebral infarction affecting left non-dominant side: Secondary | ICD-10-CM | POA: Diagnosis not present

## 2021-06-13 DIAGNOSIS — E785 Hyperlipidemia, unspecified: Secondary | ICD-10-CM | POA: Diagnosis not present

## 2021-06-13 DIAGNOSIS — R1313 Dysphagia, pharyngeal phase: Secondary | ICD-10-CM | POA: Diagnosis not present

## 2021-06-14 DIAGNOSIS — I10 Essential (primary) hypertension: Secondary | ICD-10-CM | POA: Diagnosis not present

## 2021-06-14 DIAGNOSIS — I480 Paroxysmal atrial fibrillation: Secondary | ICD-10-CM | POA: Diagnosis not present

## 2021-06-14 DIAGNOSIS — R1313 Dysphagia, pharyngeal phase: Secondary | ICD-10-CM | POA: Diagnosis not present

## 2021-06-14 DIAGNOSIS — R471 Dysarthria and anarthria: Secondary | ICD-10-CM | POA: Diagnosis not present

## 2021-06-14 DIAGNOSIS — I69354 Hemiplegia and hemiparesis following cerebral infarction affecting left non-dominant side: Secondary | ICD-10-CM | POA: Diagnosis not present

## 2021-06-14 DIAGNOSIS — E119 Type 2 diabetes mellitus without complications: Secondary | ICD-10-CM | POA: Diagnosis not present

## 2021-06-18 ENCOUNTER — Other Ambulatory Visit: Payer: Self-pay | Admitting: Family Medicine

## 2021-06-18 DIAGNOSIS — R471 Dysarthria and anarthria: Secondary | ICD-10-CM | POA: Diagnosis not present

## 2021-06-18 DIAGNOSIS — I69354 Hemiplegia and hemiparesis following cerebral infarction affecting left non-dominant side: Secondary | ICD-10-CM | POA: Diagnosis not present

## 2021-06-18 DIAGNOSIS — I10 Essential (primary) hypertension: Secondary | ICD-10-CM | POA: Diagnosis not present

## 2021-06-18 DIAGNOSIS — I480 Paroxysmal atrial fibrillation: Secondary | ICD-10-CM | POA: Diagnosis not present

## 2021-06-18 DIAGNOSIS — R1313 Dysphagia, pharyngeal phase: Secondary | ICD-10-CM | POA: Diagnosis not present

## 2021-06-18 DIAGNOSIS — E119 Type 2 diabetes mellitus without complications: Secondary | ICD-10-CM | POA: Diagnosis not present

## 2021-06-19 ENCOUNTER — Other Ambulatory Visit: Payer: Self-pay

## 2021-06-19 DIAGNOSIS — R1313 Dysphagia, pharyngeal phase: Secondary | ICD-10-CM | POA: Diagnosis not present

## 2021-06-19 DIAGNOSIS — E119 Type 2 diabetes mellitus without complications: Secondary | ICD-10-CM | POA: Diagnosis not present

## 2021-06-19 DIAGNOSIS — I69354 Hemiplegia and hemiparesis following cerebral infarction affecting left non-dominant side: Secondary | ICD-10-CM | POA: Diagnosis not present

## 2021-06-19 DIAGNOSIS — R471 Dysarthria and anarthria: Secondary | ICD-10-CM | POA: Diagnosis not present

## 2021-06-19 DIAGNOSIS — I480 Paroxysmal atrial fibrillation: Secondary | ICD-10-CM | POA: Diagnosis not present

## 2021-06-19 DIAGNOSIS — I10 Essential (primary) hypertension: Secondary | ICD-10-CM | POA: Diagnosis not present

## 2021-06-20 ENCOUNTER — Encounter: Payer: Self-pay | Admitting: Family Medicine

## 2021-06-20 ENCOUNTER — Ambulatory Visit (INDEPENDENT_AMBULATORY_CARE_PROVIDER_SITE_OTHER): Payer: Medicare Other | Admitting: Family Medicine

## 2021-06-20 VITALS — BP 122/76 | HR 94 | Temp 97.7°F

## 2021-06-20 DIAGNOSIS — R350 Frequency of micturition: Secondary | ICD-10-CM | POA: Diagnosis not present

## 2021-06-20 DIAGNOSIS — I69354 Hemiplegia and hemiparesis following cerebral infarction affecting left non-dominant side: Secondary | ICD-10-CM

## 2021-06-20 DIAGNOSIS — N3000 Acute cystitis without hematuria: Secondary | ICD-10-CM

## 2021-06-20 DIAGNOSIS — I10 Essential (primary) hypertension: Secondary | ICD-10-CM | POA: Diagnosis not present

## 2021-06-20 DIAGNOSIS — E1140 Type 2 diabetes mellitus with diabetic neuropathy, unspecified: Secondary | ICD-10-CM

## 2021-06-20 DIAGNOSIS — Z794 Long term (current) use of insulin: Secondary | ICD-10-CM

## 2021-06-20 LAB — POCT URINALYSIS DIPSTICK
Bilirubin, UA: NEGATIVE
Blood, UA: NEGATIVE
Glucose, UA: NEGATIVE
Ketones, UA: NEGATIVE
Nitrite, UA: NEGATIVE
Protein, UA: NEGATIVE
Spec Grav, UA: 1.025 (ref 1.010–1.025)
Urobilinogen, UA: NEGATIVE E.U./dL — AB
pH, UA: 5.5 (ref 5.0–8.0)

## 2021-06-20 MED ORDER — METOPROLOL TARTRATE 25 MG PO TABS: 12.5000 mg | ORAL_TABLET | Freq: Two times a day (BID) | ORAL | 3 refills | Status: DC

## 2021-06-20 MED ORDER — GABAPENTIN 100 MG PO CAPS: 100.0000 mg | ORAL_CAPSULE | Freq: Every day | ORAL | 1 refills | Status: DC

## 2021-06-20 MED ORDER — XARELTO 20 MG PO TABS: ORAL_TABLET | ORAL | 1 refills | Status: DC

## 2021-06-20 MED ORDER — AMOXICILLIN 500 MG PO TABS: 500.0000 mg | ORAL_TABLET | Freq: Two times a day (BID) | ORAL | 0 refills | Status: AC

## 2021-06-20 MED ORDER — METHOCARBAMOL 500 MG PO TABS: 500.0000 mg | ORAL_TABLET | Freq: Four times a day (QID) | ORAL | 1 refills | Status: DC

## 2021-06-20 MED ORDER — METFORMIN HCL 1000 MG PO TABS: ORAL_TABLET | ORAL | 1 refills | Status: DC

## 2021-06-20 NOTE — Progress Notes (Signed)
Subjective:    Patient ID: Jimmy Carr, male    DOB: 09-10-1941, 80 y.o.   MRN: [id number]  Chief Complaint  Patient presents with   Follow-up  Patient accompanied by his wife and son.  HPI Patient was seen today for f/u.  Pt was admitted to the [facility] 8/6-8/10/22 for suspected CVA.  MRI was not done as pt claustrophobic?  CTA 05/05/21 with severe diffuse attenuation of left PCA.  ECHO without shunt, EF 55-60%.  Mild dysarthria.  Per chart review outside the window for tPA.  Prior to hospitalization, pt took himself off Eliquis and started ASA 81 mg.  Xarelto started and eliquis d/c'd.  Pt also taking metoprolol tartrate 25 mg half tab twice daily, Robaxin, gabapentin 100 mg, propanolol 1 mg.  Patient with residual left-sided hemiparesis and edema.  Has home health PT twice a week and OT once per week.  Patient using a walker with an armrest to move around at home.  Using a bedside commode is having difficulty maneuvering into the bathroom with a walker.  Endorses LLE spasms.  Has handicap placard form for completion.  Pt notes nocturia, up 5 x per night and at least 10 x during the day to urinate.  Has intermittent dysuria.  Denies nausea, vomiting, suprapubic pain, discharge.  Drinking mostly water may have an occasional soda per day.  States blood sugar has been good, 90-130.  Taking Lantus 30-40 units nightly and Humalog 20-30 units after meals and metformin 1000 mg twice daily.  Past Medical History:  Diagnosis Date   AF (atrial fibrillation) (HCC)    2D ECHO, 03/01/1987 - normal; NUCLEAR STRESS TEST, 04/10/2006 - EF 65%, no ischemia   Bruit    CAROTID DOPPLER, 09/20/2008 - normal, no evidence of diameter reduction, significant tortuousity or any other vascular abnormality   Cerebrovascular accident [facility]) 2003   history of small vessal lacunar stroke 2003   Diabetes mellitus type II    Glaucoma    Hyperlipidemia    Hypertension    Other specified cardiac dysrhythmias(427.89)    PSVT    Peripheral neuropathy     Allergies  Allergen Reactions   Cheratussin Ac [Guaifenesin-Codeine] Nausea And Vomiting   Verapamil     ROS General: Denies fever, chills, night sweats, changes in weight, changes in appetite HEENT: Denies headaches, ear pain, changes in vision, rhinorrhea, sore throat CV: Denies CP, palpitations, SOB, orthopnea Pulm: Denies SOB, cough, wheezing GI: Denies abdominal pain, nausea, vomiting, diarrhea, constipation GU: Denies hematuria + dysuria, frequency, nocturia Msk: Denies muscle cramps, joint pains + LLE and LUE edema, back pain,  Neuro: Denies weakness, numbness, tingling + LLE spasms Skin: Denies rashes, bruising Psych: Denies depression, anxiety, hallucinations    Objective:    Blood pressure 122/76, pulse 94, temperature 97.7 F (36.5 C), temperature source Oral, SpO2 96 %.  Gen. Pleasant, well-nourished, in no distress, normal affect   HEENT: [place]/AT, face symmetric, conjunctiva clear, no scleral icterus, PERRLA, EOMI, nares patent without drainage, pharynx without erythema or exudate. Neck: No JVD, no thyromegaly, no carotid bruits Lungs: no accessory muscle use, CTAB, no wheezes or rales Cardiovascular: Irregularly irregular, faint,, no m/r/g, trace peripheral edema LLE>RLE.  Left hand and forearm with nonpitting edema. Abdomen: BS present, soft, NT/ND. Musculoskeletal: Decreased ROM LUE distally.  Unable to make a fist with left hand.  5/5 grip of right, 0/5 in L hand.  Moves LUE at elbow and shoulder.  Decreased strength LLE without spasticity.  no deformities,  no cyanosis or clubbing, normal tone Neuro:  A&Ox3, CN II-XII intact, sitting in transport wheelchair.   Skin:  Warm, no lesions/ rash  Wt Readings from Last 3 Encounters:  08/08/20 249 lb (112.9 kg)  08/04/20 246 lb (111.6 kg)  07/14/20 246 lb 6.4 oz (111.8 kg)    Lab Results  Component Value Date   WBC 8.8 08/04/2020   HGB 16.6 08/04/2020   HCT 51.0 08/04/2020   PLT 166  08/04/2020   GLUCOSE 140 (H) 08/04/2020   CHOL 136 03/02/2020   TRIG 147.0 03/02/2020   HDL 36.00 (L) 03/02/2020   LDLDIRECT 67.0 05/16/2015   LDLCALC 70 03/02/2020   ALT 21 07/14/2020   AST 17 07/14/2020   NA 139 08/04/2020   K 3.9 08/04/2020   CL 102 08/04/2020   CREATININE 1.36 (H) 08/04/2020   BUN 20 08/04/2020   CO2 25 08/04/2020   TSH 1.86 12/31/2017   PSA 1.92 03/02/2020   HGBA1C 7.9 (H) 03/02/2020   MICROALBUR 2.0 (H) 12/31/2017    Assessment/Plan:  Urinary frequency -UA with 1+ leuks, SG 1.025, no glucose -We will send urine culture -Start amoxicillin - Plan: POCT urinalysis dipstick, Culture, Urine, amoxicillin (AMOXIL) 500 MG tablet  Hemiparesis affecting left side as late effect of cerebrovascular accident ([place]) -ambulation with walker and supervision advised. -continue HH PT/OT wkly -continue current meds -monitor of any difficulty with swallowing. -Discussed the importance of medication compliance -Consider PM&R referral -Handicap placard form completed - Plan: metoprolol tartrate (LOPRESSOR) 25 MG tablet, methocarbamol (ROBAXIN) 500 MG tablet, XARELTO 20 MG TABS tablet, gabapentin (NEURONTIN) 100 MG capsule  Essential hypertension  - Plan: metoprolol tartrate (LOPRESSOR) 25 MG tablet  Acute cystitis without hematuria  - Plan: Culture, Urine, amoxicillin (AMOXIL) 500 MG tablet  Type 2 diabetes mellitus with diabetic neuropathy, with long-term current use of insulin (HCC) -Hemoglobin A1c 6.9% on 05/06/2021 during hospitalization -Continue current meds including Lantus 30-40 units nightly and Humalog 20-30 units after meals and metformin 1000 mg twice daily -Continue lifestyle modifications -Diabetic retinopathy screen recommended -We will also need foot exam -Continue lifestyle modifications  F/u in 1-2 months, sooner if needed  Grier Mitts, MD

## 2021-06-20 NOTE — Patient Instructions (Addendum)
A prescription for amoxicillin was sent to her pharmacy for your urinary tract infection.  Your urine was sent to the lab for culture to determine if the antibiotic will need to be changed.  In the meantime continue drinking plenty of water and fluids.  Refills were sent for your medications.

## 2021-06-21 DIAGNOSIS — R1313 Dysphagia, pharyngeal phase: Secondary | ICD-10-CM | POA: Diagnosis not present

## 2021-06-21 DIAGNOSIS — E119 Type 2 diabetes mellitus without complications: Secondary | ICD-10-CM | POA: Diagnosis not present

## 2021-06-21 DIAGNOSIS — R471 Dysarthria and anarthria: Secondary | ICD-10-CM | POA: Diagnosis not present

## 2021-06-21 DIAGNOSIS — I480 Paroxysmal atrial fibrillation: Secondary | ICD-10-CM | POA: Diagnosis not present

## 2021-06-21 DIAGNOSIS — I69354 Hemiplegia and hemiparesis following cerebral infarction affecting left non-dominant side: Secondary | ICD-10-CM | POA: Diagnosis not present

## 2021-06-21 DIAGNOSIS — I10 Essential (primary) hypertension: Secondary | ICD-10-CM | POA: Diagnosis not present

## 2021-06-21 LAB — URINE CULTURE
MICRO NUMBER:: 12404240
SPECIMEN QUALITY:: ADEQUATE

## 2021-06-25 DIAGNOSIS — R471 Dysarthria and anarthria: Secondary | ICD-10-CM | POA: Diagnosis not present

## 2021-06-25 DIAGNOSIS — I10 Essential (primary) hypertension: Secondary | ICD-10-CM | POA: Diagnosis not present

## 2021-06-25 DIAGNOSIS — I480 Paroxysmal atrial fibrillation: Secondary | ICD-10-CM | POA: Diagnosis not present

## 2021-06-25 DIAGNOSIS — E119 Type 2 diabetes mellitus without complications: Secondary | ICD-10-CM | POA: Diagnosis not present

## 2021-06-25 DIAGNOSIS — I69354 Hemiplegia and hemiparesis following cerebral infarction affecting left non-dominant side: Secondary | ICD-10-CM | POA: Diagnosis not present

## 2021-06-25 DIAGNOSIS — R1313 Dysphagia, pharyngeal phase: Secondary | ICD-10-CM | POA: Diagnosis not present

## 2021-06-26 DIAGNOSIS — E785 Hyperlipidemia, unspecified: Secondary | ICD-10-CM | POA: Diagnosis not present

## 2021-06-26 DIAGNOSIS — I69354 Hemiplegia and hemiparesis following cerebral infarction affecting left non-dominant side: Secondary | ICD-10-CM | POA: Diagnosis not present

## 2021-06-26 DIAGNOSIS — I6930 Unspecified sequelae of cerebral infarction: Secondary | ICD-10-CM | POA: Diagnosis not present

## 2021-06-26 DIAGNOSIS — G629 Polyneuropathy, unspecified: Secondary | ICD-10-CM | POA: Diagnosis not present

## 2021-06-26 DIAGNOSIS — I10 Essential (primary) hypertension: Secondary | ICD-10-CM | POA: Diagnosis not present

## 2021-06-26 DIAGNOSIS — Z8673 Personal history of transient ischemic attack (TIA), and cerebral infarction without residual deficits: Secondary | ICD-10-CM | POA: Diagnosis not present

## 2021-06-26 DIAGNOSIS — I480 Paroxysmal atrial fibrillation: Secondary | ICD-10-CM | POA: Diagnosis not present

## 2021-06-28 DIAGNOSIS — R1313 Dysphagia, pharyngeal phase: Secondary | ICD-10-CM | POA: Diagnosis not present

## 2021-06-28 DIAGNOSIS — I10 Essential (primary) hypertension: Secondary | ICD-10-CM | POA: Diagnosis not present

## 2021-06-28 DIAGNOSIS — E119 Type 2 diabetes mellitus without complications: Secondary | ICD-10-CM | POA: Diagnosis not present

## 2021-06-28 DIAGNOSIS — I69354 Hemiplegia and hemiparesis following cerebral infarction affecting left non-dominant side: Secondary | ICD-10-CM | POA: Diagnosis not present

## 2021-06-28 DIAGNOSIS — R471 Dysarthria and anarthria: Secondary | ICD-10-CM | POA: Diagnosis not present

## 2021-06-28 DIAGNOSIS — I480 Paroxysmal atrial fibrillation: Secondary | ICD-10-CM | POA: Diagnosis not present

## 2021-07-03 ENCOUNTER — Other Ambulatory Visit: Payer: Self-pay

## 2021-07-03 DIAGNOSIS — I69354 Hemiplegia and hemiparesis following cerebral infarction affecting left non-dominant side: Secondary | ICD-10-CM | POA: Diagnosis not present

## 2021-07-03 DIAGNOSIS — R471 Dysarthria and anarthria: Secondary | ICD-10-CM | POA: Diagnosis not present

## 2021-07-03 DIAGNOSIS — E119 Type 2 diabetes mellitus without complications: Secondary | ICD-10-CM | POA: Diagnosis not present

## 2021-07-03 DIAGNOSIS — I10 Essential (primary) hypertension: Secondary | ICD-10-CM | POA: Diagnosis not present

## 2021-07-03 DIAGNOSIS — R1313 Dysphagia, pharyngeal phase: Secondary | ICD-10-CM | POA: Diagnosis not present

## 2021-07-03 DIAGNOSIS — I480 Paroxysmal atrial fibrillation: Secondary | ICD-10-CM | POA: Diagnosis not present

## 2021-07-04 ENCOUNTER — Encounter: Payer: Self-pay | Admitting: Family Medicine

## 2021-07-04 ENCOUNTER — Ambulatory Visit (INDEPENDENT_AMBULATORY_CARE_PROVIDER_SITE_OTHER): Payer: Medicare Other | Admitting: Family Medicine

## 2021-07-04 VITALS — BP 124/76 | HR 112 | Temp 97.7°F

## 2021-07-04 DIAGNOSIS — R600 Localized edema: Secondary | ICD-10-CM

## 2021-07-04 DIAGNOSIS — R1313 Dysphagia, pharyngeal phase: Secondary | ICD-10-CM | POA: Diagnosis not present

## 2021-07-04 DIAGNOSIS — I69354 Hemiplegia and hemiparesis following cerebral infarction affecting left non-dominant side: Secondary | ICD-10-CM | POA: Diagnosis not present

## 2021-07-04 DIAGNOSIS — E119 Type 2 diabetes mellitus without complications: Secondary | ICD-10-CM | POA: Diagnosis not present

## 2021-07-04 DIAGNOSIS — I10 Essential (primary) hypertension: Secondary | ICD-10-CM

## 2021-07-04 DIAGNOSIS — R350 Frequency of micturition: Secondary | ICD-10-CM | POA: Diagnosis not present

## 2021-07-04 DIAGNOSIS — I480 Paroxysmal atrial fibrillation: Secondary | ICD-10-CM

## 2021-07-04 DIAGNOSIS — R471 Dysarthria and anarthria: Secondary | ICD-10-CM | POA: Diagnosis not present

## 2021-07-04 NOTE — Progress Notes (Signed)
Subjective:    Patient ID: Jimmy Carr, male    DOB: 01-Dec-1940, 80 y.o.   MRN: [id number]  Chief Complaint  Patient presents with   Foot Swelling  Pt accompanied by his wife and daughter.  HPI Patient was seen today for f/u.  Pt has home PT.  Taking gabapentin, robaxin, and requip s/p CVA with residual L sided weakness.  Family notes medications causing drowsiness which make it difficult for pt to participate in PT.  Pt with LLE edema since CVA. Pt endorses urinary frequency during the day.  Fills up several urinals daily.  Wearing depends.  Does not have to get up much during the night.  Pt does not want to take extra medications if possible.  Past Medical History:  Diagnosis Date   AF (atrial fibrillation) (HCC)    2D ECHO, 03/01/1987 - normal; NUCLEAR STRESS TEST, 04/10/2006 - EF 65%, no ischemia   Bruit    CAROTID DOPPLER, 09/20/2008 - normal, no evidence of diameter reduction, significant tortuousity or any other vascular abnormality   Cerebrovascular accident [facility]) 2003   history of small vessal lacunar stroke 2003   Diabetes mellitus type II    Glaucoma    Hyperlipidemia    Hypertension    Other specified cardiac dysrhythmias(427.89)    PSVT   Peripheral neuropathy     Allergies  Allergen Reactions   Cheratussin Ac [Guaifenesin-Codeine] Nausea And Vomiting   Verapamil     ROS General: Denies fever, chills, night sweats, changes in weight, changes in appetite HEENT: Denies headaches, ear pain, changes in vision, rhinorrhea, sore throat CV: Denies CP, palpitations, SOB, orthopnea  +LLE edema Pulm: Denies SOB, cough, wheezing GI: Denies abdominal pain, nausea, vomiting, diarrhea, constipation GU: Denies dysuria, hematuria,  +urinary frequency Msk: Denies muscle cramps, joint pains +muscle spasms Neuro: Denies weakness, numbness, tingling +L sided hemiparesis Skin: Denies rashes, bruising Psych: Denies depression, anxiety, hallucinations     Objective:    Blood  pressure 124/76, pulse (!) 112, temperature 97.7 F (36.5 C), temperature source Oral, SpO2 97 %.  Gen. Pleasant, well-nourished, in no distress, normal affect   HEENT: [place]/AT, face symmetric, conjunctiva clear, no scleral icterus, PERRLA, EOMI, nares patent without drainage, pharynx without erythema or exudate. Lungs: no accessory muscle use, CTAB, no wheezes or rales Cardiovascular: Irregularly irregular, no m/r/g, LLE edema 1+ to knee> RLE edema  Abdomen: BS present, soft, NT/ND, no hepatosplenomegaly. Musculoskeletal: Limited ROM in LUE, L grip strength 0/5, R grip 5/5.  No deformities, no cyanosis or clubbing, normal tone Neuro:  A&Ox3, CN II-XII intact, normal gait Skin:  Warm, no lesions/ rash   Wt Readings from Last 3 Encounters:  08/08/20 249 lb (112.9 kg)  08/04/20 246 lb (111.6 kg)  07/14/20 246 lb 6.4 oz (111.8 kg)    Lab Results  Component Value Date   WBC 8.8 08/04/2020   HGB 16.6 08/04/2020   HCT 51.0 08/04/2020   PLT 166 08/04/2020   GLUCOSE 140 (H) 08/04/2020   CHOL 136 03/02/2020   TRIG 147.0 03/02/2020   HDL 36.00 (L) 03/02/2020   LDLDIRECT 67.0 05/16/2015   LDLCALC 70 03/02/2020   ALT 21 07/14/2020   AST 17 07/14/2020   NA 139 08/04/2020   K 3.9 08/04/2020   CL 102 08/04/2020   CREATININE 1.36 (H) 08/04/2020   BUN 20 08/04/2020   CO2 25 08/04/2020   TSH 1.86 12/31/2017   PSA 1.92 03/02/2020   HGBA1C 7.9 (H) 03/02/2020   MICROALBUR  2.0 (H) 12/31/2017    Assessment/Plan:  Hemiparesis affecting left side as late effect of cerebrovascular accident ([place]) -continue [facility] PT/OT -discussed decreasing robaxin and requip to avoid increased drowsiness. -Will use robaxin prior to PT -continue gabapentin 100 mg qhs  -Consider d/c both robaxin and requip and trying baclofen if needed.  PAF (paroxysmal atrial fibrillation) (HCC) -stable -Continue Xarelto 20 mg -Continue follow-up cardiology  Essential hypertension -Controlled -Continue metoprolol tartrate  25 mg twice daily, lisinopril 40 mg daily, and HCTZ 25 mg daily -Continue low-sodium diet  Edema of left lower extremity -Likely 2/2 recent CVA affecting left side -Also advised increased sitting can cause dependent edema. -Echo from 05/06/2021 with EF 55-60%, unable to assess diastolic function due to atrial fibrillation -Discussed supportive care including TED hose, compression socks, wrapping lower extremities with Ace bandage, elevation -Advise HCTZ 25 mg daily should help decrease edema.  If no changes noted consider d/c'ing HCTZ and starting low-dose Lasix.  Patient currently hesitant as does not want any medication to increase urinary frequency. -Continue Xarelto -For continued or worsening symptoms obtain ultrasound to rule out DVT  Urinary frequency -Discussed possible causes including BPH, medication, UTI, OAB -PSA normal on 03/02/2020 -Currently taking HCTZ 25 mg daily which may be contributing -Discussed r/b/a of medication options such as Flomax or Myrbetriq, etc.  Patient wishes to wait as does not want to have to take an additional medication at this time.  Also concerned about fall risks. -We will reevaluate at next OFV  F/u in 1 month  Grier Mitts, MD

## 2021-07-05 DIAGNOSIS — R471 Dysarthria and anarthria: Secondary | ICD-10-CM | POA: Diagnosis not present

## 2021-07-05 DIAGNOSIS — I480 Paroxysmal atrial fibrillation: Secondary | ICD-10-CM | POA: Diagnosis not present

## 2021-07-05 DIAGNOSIS — R1313 Dysphagia, pharyngeal phase: Secondary | ICD-10-CM | POA: Diagnosis not present

## 2021-07-05 DIAGNOSIS — E119 Type 2 diabetes mellitus without complications: Secondary | ICD-10-CM | POA: Diagnosis not present

## 2021-07-05 DIAGNOSIS — I10 Essential (primary) hypertension: Secondary | ICD-10-CM | POA: Diagnosis not present

## 2021-07-05 DIAGNOSIS — I69354 Hemiplegia and hemiparesis following cerebral infarction affecting left non-dominant side: Secondary | ICD-10-CM | POA: Diagnosis not present

## 2021-07-06 DIAGNOSIS — Z23 Encounter for immunization: Secondary | ICD-10-CM | POA: Diagnosis not present

## 2021-07-09 DIAGNOSIS — I69354 Hemiplegia and hemiparesis following cerebral infarction affecting left non-dominant side: Secondary | ICD-10-CM | POA: Diagnosis not present

## 2021-07-09 DIAGNOSIS — R471 Dysarthria and anarthria: Secondary | ICD-10-CM | POA: Diagnosis not present

## 2021-07-09 DIAGNOSIS — I480 Paroxysmal atrial fibrillation: Secondary | ICD-10-CM | POA: Diagnosis not present

## 2021-07-09 DIAGNOSIS — R1313 Dysphagia, pharyngeal phase: Secondary | ICD-10-CM | POA: Diagnosis not present

## 2021-07-09 DIAGNOSIS — I10 Essential (primary) hypertension: Secondary | ICD-10-CM | POA: Diagnosis not present

## 2021-07-09 DIAGNOSIS — E119 Type 2 diabetes mellitus without complications: Secondary | ICD-10-CM | POA: Diagnosis not present

## 2021-07-11 ENCOUNTER — Other Ambulatory Visit: Payer: Self-pay | Admitting: Family Medicine

## 2021-07-11 ENCOUNTER — Encounter: Payer: Self-pay | Admitting: Family Medicine

## 2021-07-11 DIAGNOSIS — I10 Essential (primary) hypertension: Secondary | ICD-10-CM

## 2021-07-11 DIAGNOSIS — R471 Dysarthria and anarthria: Secondary | ICD-10-CM | POA: Diagnosis not present

## 2021-07-11 DIAGNOSIS — I69354 Hemiplegia and hemiparesis following cerebral infarction affecting left non-dominant side: Secondary | ICD-10-CM | POA: Diagnosis not present

## 2021-07-11 DIAGNOSIS — I480 Paroxysmal atrial fibrillation: Secondary | ICD-10-CM | POA: Diagnosis not present

## 2021-07-11 DIAGNOSIS — R1313 Dysphagia, pharyngeal phase: Secondary | ICD-10-CM | POA: Diagnosis not present

## 2021-07-11 DIAGNOSIS — E119 Type 2 diabetes mellitus without complications: Secondary | ICD-10-CM | POA: Diagnosis not present

## 2021-07-12 ENCOUNTER — Other Ambulatory Visit: Payer: Self-pay | Admitting: Family Medicine

## 2021-07-12 DIAGNOSIS — R1313 Dysphagia, pharyngeal phase: Secondary | ICD-10-CM | POA: Diagnosis not present

## 2021-07-12 DIAGNOSIS — R471 Dysarthria and anarthria: Secondary | ICD-10-CM | POA: Diagnosis not present

## 2021-07-12 DIAGNOSIS — E119 Type 2 diabetes mellitus without complications: Secondary | ICD-10-CM | POA: Diagnosis not present

## 2021-07-12 DIAGNOSIS — I480 Paroxysmal atrial fibrillation: Secondary | ICD-10-CM | POA: Diagnosis not present

## 2021-07-12 DIAGNOSIS — I69354 Hemiplegia and hemiparesis following cerebral infarction affecting left non-dominant side: Secondary | ICD-10-CM | POA: Diagnosis not present

## 2021-07-12 DIAGNOSIS — I10 Essential (primary) hypertension: Secondary | ICD-10-CM | POA: Diagnosis not present

## 2021-07-13 DIAGNOSIS — I480 Paroxysmal atrial fibrillation: Secondary | ICD-10-CM | POA: Diagnosis not present

## 2021-07-13 DIAGNOSIS — I10 Essential (primary) hypertension: Secondary | ICD-10-CM | POA: Diagnosis not present

## 2021-07-13 DIAGNOSIS — R1313 Dysphagia, pharyngeal phase: Secondary | ICD-10-CM | POA: Diagnosis not present

## 2021-07-13 DIAGNOSIS — E785 Hyperlipidemia, unspecified: Secondary | ICD-10-CM | POA: Diagnosis not present

## 2021-07-13 DIAGNOSIS — E119 Type 2 diabetes mellitus without complications: Secondary | ICD-10-CM | POA: Diagnosis not present

## 2021-07-13 DIAGNOSIS — I69354 Hemiplegia and hemiparesis following cerebral infarction affecting left non-dominant side: Secondary | ICD-10-CM | POA: Diagnosis not present

## 2021-07-13 DIAGNOSIS — R471 Dysarthria and anarthria: Secondary | ICD-10-CM | POA: Diagnosis not present

## 2021-07-13 DIAGNOSIS — R269 Unspecified abnormalities of gait and mobility: Secondary | ICD-10-CM | POA: Diagnosis not present

## 2021-07-16 DIAGNOSIS — I69354 Hemiplegia and hemiparesis following cerebral infarction affecting left non-dominant side: Secondary | ICD-10-CM | POA: Diagnosis not present

## 2021-07-16 DIAGNOSIS — I480 Paroxysmal atrial fibrillation: Secondary | ICD-10-CM | POA: Diagnosis not present

## 2021-07-16 DIAGNOSIS — E119 Type 2 diabetes mellitus without complications: Secondary | ICD-10-CM | POA: Diagnosis not present

## 2021-07-16 DIAGNOSIS — R471 Dysarthria and anarthria: Secondary | ICD-10-CM | POA: Diagnosis not present

## 2021-07-16 DIAGNOSIS — I10 Essential (primary) hypertension: Secondary | ICD-10-CM | POA: Diagnosis not present

## 2021-07-16 DIAGNOSIS — R1313 Dysphagia, pharyngeal phase: Secondary | ICD-10-CM | POA: Diagnosis not present

## 2021-07-17 DIAGNOSIS — I480 Paroxysmal atrial fibrillation: Secondary | ICD-10-CM | POA: Diagnosis not present

## 2021-07-17 DIAGNOSIS — I10 Essential (primary) hypertension: Secondary | ICD-10-CM | POA: Diagnosis not present

## 2021-07-17 DIAGNOSIS — R471 Dysarthria and anarthria: Secondary | ICD-10-CM | POA: Diagnosis not present

## 2021-07-17 DIAGNOSIS — I69354 Hemiplegia and hemiparesis following cerebral infarction affecting left non-dominant side: Secondary | ICD-10-CM | POA: Diagnosis not present

## 2021-07-17 DIAGNOSIS — R1313 Dysphagia, pharyngeal phase: Secondary | ICD-10-CM | POA: Diagnosis not present

## 2021-07-17 DIAGNOSIS — E119 Type 2 diabetes mellitus without complications: Secondary | ICD-10-CM | POA: Diagnosis not present

## 2021-07-19 DIAGNOSIS — I69354 Hemiplegia and hemiparesis following cerebral infarction affecting left non-dominant side: Secondary | ICD-10-CM | POA: Diagnosis not present

## 2021-07-19 DIAGNOSIS — R1313 Dysphagia, pharyngeal phase: Secondary | ICD-10-CM | POA: Diagnosis not present

## 2021-07-19 DIAGNOSIS — I480 Paroxysmal atrial fibrillation: Secondary | ICD-10-CM | POA: Diagnosis not present

## 2021-07-19 DIAGNOSIS — I10 Essential (primary) hypertension: Secondary | ICD-10-CM | POA: Diagnosis not present

## 2021-07-19 DIAGNOSIS — E119 Type 2 diabetes mellitus without complications: Secondary | ICD-10-CM | POA: Diagnosis not present

## 2021-07-19 DIAGNOSIS — R471 Dysarthria and anarthria: Secondary | ICD-10-CM | POA: Diagnosis not present

## 2021-07-23 DIAGNOSIS — I10 Essential (primary) hypertension: Secondary | ICD-10-CM | POA: Diagnosis not present

## 2021-07-23 DIAGNOSIS — E119 Type 2 diabetes mellitus without complications: Secondary | ICD-10-CM | POA: Diagnosis not present

## 2021-07-23 DIAGNOSIS — I480 Paroxysmal atrial fibrillation: Secondary | ICD-10-CM | POA: Diagnosis not present

## 2021-07-23 DIAGNOSIS — I69354 Hemiplegia and hemiparesis following cerebral infarction affecting left non-dominant side: Secondary | ICD-10-CM | POA: Diagnosis not present

## 2021-07-23 DIAGNOSIS — R1313 Dysphagia, pharyngeal phase: Secondary | ICD-10-CM | POA: Diagnosis not present

## 2021-07-23 DIAGNOSIS — R471 Dysarthria and anarthria: Secondary | ICD-10-CM | POA: Diagnosis not present

## 2021-07-24 ENCOUNTER — Telehealth: Payer: Self-pay | Admitting: Family Medicine

## 2021-07-24 ENCOUNTER — Other Ambulatory Visit: Payer: Self-pay | Admitting: Family Medicine

## 2021-07-24 DIAGNOSIS — I10 Essential (primary) hypertension: Secondary | ICD-10-CM

## 2021-07-24 NOTE — Telephone Encounter (Signed)
Pt is calling and has been taking lisinopril 40 mg once a day and went in hospital and was put on lisinopril 20 mg. Pt has lisinopril 20 mg on hand and would like to know if he can take 2 of 20 mg of lisinopril to total 40 mg. Patient is aware md not in office today.  Please advise

## 2021-07-25 DIAGNOSIS — R1313 Dysphagia, pharyngeal phase: Secondary | ICD-10-CM | POA: Diagnosis not present

## 2021-07-25 DIAGNOSIS — E119 Type 2 diabetes mellitus without complications: Secondary | ICD-10-CM | POA: Diagnosis not present

## 2021-07-25 DIAGNOSIS — I480 Paroxysmal atrial fibrillation: Secondary | ICD-10-CM | POA: Diagnosis not present

## 2021-07-25 DIAGNOSIS — I10 Essential (primary) hypertension: Secondary | ICD-10-CM | POA: Diagnosis not present

## 2021-07-25 DIAGNOSIS — R471 Dysarthria and anarthria: Secondary | ICD-10-CM | POA: Diagnosis not present

## 2021-07-25 DIAGNOSIS — I69354 Hemiplegia and hemiparesis following cerebral infarction affecting left non-dominant side: Secondary | ICD-10-CM | POA: Diagnosis not present

## 2021-07-27 DIAGNOSIS — I480 Paroxysmal atrial fibrillation: Secondary | ICD-10-CM | POA: Diagnosis not present

## 2021-07-27 DIAGNOSIS — R1313 Dysphagia, pharyngeal phase: Secondary | ICD-10-CM | POA: Diagnosis not present

## 2021-07-27 DIAGNOSIS — E119 Type 2 diabetes mellitus without complications: Secondary | ICD-10-CM | POA: Diagnosis not present

## 2021-07-27 DIAGNOSIS — I10 Essential (primary) hypertension: Secondary | ICD-10-CM | POA: Diagnosis not present

## 2021-07-27 DIAGNOSIS — I69354 Hemiplegia and hemiparesis following cerebral infarction affecting left non-dominant side: Secondary | ICD-10-CM | POA: Diagnosis not present

## 2021-07-27 DIAGNOSIS — R471 Dysarthria and anarthria: Secondary | ICD-10-CM | POA: Diagnosis not present

## 2021-07-27 NOTE — Telephone Encounter (Signed)
Spoke with pt, advised per pcp, it is okay to take two 20 mg to equal the 40 mg.

## 2021-07-30 DIAGNOSIS — R1313 Dysphagia, pharyngeal phase: Secondary | ICD-10-CM | POA: Diagnosis not present

## 2021-07-30 DIAGNOSIS — E119 Type 2 diabetes mellitus without complications: Secondary | ICD-10-CM | POA: Diagnosis not present

## 2021-07-30 DIAGNOSIS — I480 Paroxysmal atrial fibrillation: Secondary | ICD-10-CM | POA: Diagnosis not present

## 2021-07-30 DIAGNOSIS — R471 Dysarthria and anarthria: Secondary | ICD-10-CM | POA: Diagnosis not present

## 2021-07-30 DIAGNOSIS — I10 Essential (primary) hypertension: Secondary | ICD-10-CM | POA: Diagnosis not present

## 2021-07-30 DIAGNOSIS — I69354 Hemiplegia and hemiparesis following cerebral infarction affecting left non-dominant side: Secondary | ICD-10-CM | POA: Diagnosis not present

## 2021-07-31 ENCOUNTER — Other Ambulatory Visit: Payer: Self-pay | Admitting: Family Medicine

## 2021-08-01 DIAGNOSIS — R471 Dysarthria and anarthria: Secondary | ICD-10-CM | POA: Diagnosis not present

## 2021-08-01 DIAGNOSIS — R1313 Dysphagia, pharyngeal phase: Secondary | ICD-10-CM | POA: Diagnosis not present

## 2021-08-01 DIAGNOSIS — E119 Type 2 diabetes mellitus without complications: Secondary | ICD-10-CM | POA: Diagnosis not present

## 2021-08-01 DIAGNOSIS — I10 Essential (primary) hypertension: Secondary | ICD-10-CM | POA: Diagnosis not present

## 2021-08-01 DIAGNOSIS — I69354 Hemiplegia and hemiparesis following cerebral infarction affecting left non-dominant side: Secondary | ICD-10-CM | POA: Diagnosis not present

## 2021-08-01 DIAGNOSIS — I480 Paroxysmal atrial fibrillation: Secondary | ICD-10-CM | POA: Diagnosis not present

## 2021-08-03 ENCOUNTER — Telehealth: Payer: Self-pay

## 2021-08-03 NOTE — Telephone Encounter (Signed)
Spoke with Jimmy Carr, gave VO per Dr Volanda Napoleon to continue PT.

## 2021-08-03 NOTE — Telephone Encounter (Signed)
[facility] called asking for verbal orders to continue physical therapy  frequency 2x 4 weeks  Call back # [phone]

## 2021-08-06 DIAGNOSIS — I69354 Hemiplegia and hemiparesis following cerebral infarction affecting left non-dominant side: Secondary | ICD-10-CM | POA: Diagnosis not present

## 2021-08-06 DIAGNOSIS — I10 Essential (primary) hypertension: Secondary | ICD-10-CM | POA: Diagnosis not present

## 2021-08-06 DIAGNOSIS — R471 Dysarthria and anarthria: Secondary | ICD-10-CM | POA: Diagnosis not present

## 2021-08-06 DIAGNOSIS — E119 Type 2 diabetes mellitus without complications: Secondary | ICD-10-CM | POA: Diagnosis not present

## 2021-08-06 DIAGNOSIS — I480 Paroxysmal atrial fibrillation: Secondary | ICD-10-CM | POA: Diagnosis not present

## 2021-08-06 DIAGNOSIS — R1313 Dysphagia, pharyngeal phase: Secondary | ICD-10-CM | POA: Diagnosis not present

## 2021-08-07 DIAGNOSIS — I480 Paroxysmal atrial fibrillation: Secondary | ICD-10-CM | POA: Diagnosis not present

## 2021-08-07 DIAGNOSIS — E119 Type 2 diabetes mellitus without complications: Secondary | ICD-10-CM | POA: Diagnosis not present

## 2021-08-07 DIAGNOSIS — R1313 Dysphagia, pharyngeal phase: Secondary | ICD-10-CM | POA: Diagnosis not present

## 2021-08-07 DIAGNOSIS — I69354 Hemiplegia and hemiparesis following cerebral infarction affecting left non-dominant side: Secondary | ICD-10-CM | POA: Diagnosis not present

## 2021-08-07 DIAGNOSIS — R471 Dysarthria and anarthria: Secondary | ICD-10-CM | POA: Diagnosis not present

## 2021-08-07 DIAGNOSIS — I10 Essential (primary) hypertension: Secondary | ICD-10-CM | POA: Diagnosis not present

## 2021-08-08 ENCOUNTER — Telehealth: Payer: Self-pay | Admitting: Family Medicine

## 2021-08-08 DIAGNOSIS — I69354 Hemiplegia and hemiparesis following cerebral infarction affecting left non-dominant side: Secondary | ICD-10-CM | POA: Diagnosis not present

## 2021-08-08 DIAGNOSIS — E119 Type 2 diabetes mellitus without complications: Secondary | ICD-10-CM | POA: Diagnosis not present

## 2021-08-08 DIAGNOSIS — R1313 Dysphagia, pharyngeal phase: Secondary | ICD-10-CM | POA: Diagnosis not present

## 2021-08-08 DIAGNOSIS — I10 Essential (primary) hypertension: Secondary | ICD-10-CM | POA: Diagnosis not present

## 2021-08-08 DIAGNOSIS — R471 Dysarthria and anarthria: Secondary | ICD-10-CM | POA: Diagnosis not present

## 2021-08-08 DIAGNOSIS — I480 Paroxysmal atrial fibrillation: Secondary | ICD-10-CM | POA: Diagnosis not present

## 2021-08-08 NOTE — Telephone Encounter (Signed)
Jimmy Carr OT  with [facility] is calling and needs OT verbal orders 2x4 , for self care, transfers

## 2021-08-10 NOTE — Telephone Encounter (Signed)
Jimmy Carr, x2 with no answer, to give VO. Left vm to call office.

## 2021-08-12 DIAGNOSIS — E785 Hyperlipidemia, unspecified: Secondary | ICD-10-CM | POA: Diagnosis not present

## 2021-08-12 DIAGNOSIS — Z794 Long term (current) use of insulin: Secondary | ICD-10-CM | POA: Diagnosis not present

## 2021-08-12 DIAGNOSIS — R1313 Dysphagia, pharyngeal phase: Secondary | ICD-10-CM | POA: Diagnosis not present

## 2021-08-12 DIAGNOSIS — R2681 Unsteadiness on feet: Secondary | ICD-10-CM | POA: Diagnosis not present

## 2021-08-12 DIAGNOSIS — I69354 Hemiplegia and hemiparesis following cerebral infarction affecting left non-dominant side: Secondary | ICD-10-CM | POA: Diagnosis not present

## 2021-08-12 DIAGNOSIS — E119 Type 2 diabetes mellitus without complications: Secondary | ICD-10-CM | POA: Diagnosis not present

## 2021-08-12 DIAGNOSIS — I10 Essential (primary) hypertension: Secondary | ICD-10-CM | POA: Diagnosis not present

## 2021-08-12 DIAGNOSIS — R471 Dysarthria and anarthria: Secondary | ICD-10-CM | POA: Diagnosis not present

## 2021-08-12 DIAGNOSIS — I480 Paroxysmal atrial fibrillation: Secondary | ICD-10-CM | POA: Diagnosis not present

## 2021-08-13 DIAGNOSIS — Z794 Long term (current) use of insulin: Secondary | ICD-10-CM | POA: Diagnosis not present

## 2021-08-13 DIAGNOSIS — R2681 Unsteadiness on feet: Secondary | ICD-10-CM | POA: Diagnosis not present

## 2021-08-13 DIAGNOSIS — I480 Paroxysmal atrial fibrillation: Secondary | ICD-10-CM | POA: Diagnosis not present

## 2021-08-13 DIAGNOSIS — E119 Type 2 diabetes mellitus without complications: Secondary | ICD-10-CM | POA: Diagnosis not present

## 2021-08-13 DIAGNOSIS — I69354 Hemiplegia and hemiparesis following cerebral infarction affecting left non-dominant side: Secondary | ICD-10-CM | POA: Diagnosis not present

## 2021-08-13 DIAGNOSIS — R471 Dysarthria and anarthria: Secondary | ICD-10-CM | POA: Diagnosis not present

## 2021-08-13 NOTE — Telephone Encounter (Signed)
Called Jimmy Carr again, no answer.

## 2021-08-14 DIAGNOSIS — I69354 Hemiplegia and hemiparesis following cerebral infarction affecting left non-dominant side: Secondary | ICD-10-CM | POA: Diagnosis not present

## 2021-08-14 DIAGNOSIS — Z794 Long term (current) use of insulin: Secondary | ICD-10-CM | POA: Diagnosis not present

## 2021-08-14 DIAGNOSIS — E119 Type 2 diabetes mellitus without complications: Secondary | ICD-10-CM | POA: Diagnosis not present

## 2021-08-14 DIAGNOSIS — R2681 Unsteadiness on feet: Secondary | ICD-10-CM | POA: Diagnosis not present

## 2021-08-14 DIAGNOSIS — I480 Paroxysmal atrial fibrillation: Secondary | ICD-10-CM | POA: Diagnosis not present

## 2021-08-14 DIAGNOSIS — R471 Dysarthria and anarthria: Secondary | ICD-10-CM | POA: Diagnosis not present

## 2021-08-15 ENCOUNTER — Other Ambulatory Visit: Payer: Self-pay | Admitting: Family Medicine

## 2021-08-15 DIAGNOSIS — R471 Dysarthria and anarthria: Secondary | ICD-10-CM | POA: Diagnosis not present

## 2021-08-15 DIAGNOSIS — I10 Essential (primary) hypertension: Secondary | ICD-10-CM

## 2021-08-15 DIAGNOSIS — I69354 Hemiplegia and hemiparesis following cerebral infarction affecting left non-dominant side: Secondary | ICD-10-CM | POA: Diagnosis not present

## 2021-08-15 DIAGNOSIS — Z794 Long term (current) use of insulin: Secondary | ICD-10-CM | POA: Diagnosis not present

## 2021-08-15 DIAGNOSIS — R2681 Unsteadiness on feet: Secondary | ICD-10-CM | POA: Diagnosis not present

## 2021-08-15 DIAGNOSIS — I480 Paroxysmal atrial fibrillation: Secondary | ICD-10-CM | POA: Diagnosis not present

## 2021-08-15 DIAGNOSIS — E119 Type 2 diabetes mellitus without complications: Secondary | ICD-10-CM | POA: Diagnosis not present

## 2021-08-16 DIAGNOSIS — R471 Dysarthria and anarthria: Secondary | ICD-10-CM | POA: Diagnosis not present

## 2021-08-16 DIAGNOSIS — I480 Paroxysmal atrial fibrillation: Secondary | ICD-10-CM | POA: Diagnosis not present

## 2021-08-16 DIAGNOSIS — I69354 Hemiplegia and hemiparesis following cerebral infarction affecting left non-dominant side: Secondary | ICD-10-CM | POA: Diagnosis not present

## 2021-08-16 DIAGNOSIS — Z794 Long term (current) use of insulin: Secondary | ICD-10-CM | POA: Diagnosis not present

## 2021-08-16 DIAGNOSIS — R2681 Unsteadiness on feet: Secondary | ICD-10-CM | POA: Diagnosis not present

## 2021-08-16 DIAGNOSIS — E119 Type 2 diabetes mellitus without complications: Secondary | ICD-10-CM | POA: Diagnosis not present

## 2021-08-17 ENCOUNTER — Ambulatory Visit (INDEPENDENT_AMBULATORY_CARE_PROVIDER_SITE_OTHER): Payer: Medicare Other | Admitting: Family Medicine

## 2021-08-17 VITALS — BP 118/82 | HR 77 | Temp 97.7°F

## 2021-08-17 DIAGNOSIS — I480 Paroxysmal atrial fibrillation: Secondary | ICD-10-CM | POA: Diagnosis not present

## 2021-08-17 DIAGNOSIS — E1140 Type 2 diabetes mellitus with diabetic neuropathy, unspecified: Secondary | ICD-10-CM | POA: Diagnosis not present

## 2021-08-17 DIAGNOSIS — Z794 Long term (current) use of insulin: Secondary | ICD-10-CM

## 2021-08-17 DIAGNOSIS — I872 Venous insufficiency (chronic) (peripheral): Secondary | ICD-10-CM

## 2021-08-17 DIAGNOSIS — I10 Essential (primary) hypertension: Secondary | ICD-10-CM

## 2021-08-17 DIAGNOSIS — R600 Localized edema: Secondary | ICD-10-CM

## 2021-08-17 MED ORDER — FUROSEMIDE 20 MG PO TABS: 20.0000 mg | ORAL_TABLET | Freq: Every day | ORAL | 3 refills | Status: DC

## 2021-08-20 DIAGNOSIS — E119 Type 2 diabetes mellitus without complications: Secondary | ICD-10-CM | POA: Diagnosis not present

## 2021-08-20 DIAGNOSIS — I480 Paroxysmal atrial fibrillation: Secondary | ICD-10-CM | POA: Diagnosis not present

## 2021-08-20 DIAGNOSIS — R2681 Unsteadiness on feet: Secondary | ICD-10-CM | POA: Diagnosis not present

## 2021-08-20 DIAGNOSIS — Z794 Long term (current) use of insulin: Secondary | ICD-10-CM | POA: Diagnosis not present

## 2021-08-20 DIAGNOSIS — I69354 Hemiplegia and hemiparesis following cerebral infarction affecting left non-dominant side: Secondary | ICD-10-CM | POA: Diagnosis not present

## 2021-08-20 DIAGNOSIS — R471 Dysarthria and anarthria: Secondary | ICD-10-CM | POA: Diagnosis not present

## 2021-08-22 DIAGNOSIS — I69354 Hemiplegia and hemiparesis following cerebral infarction affecting left non-dominant side: Secondary | ICD-10-CM | POA: Diagnosis not present

## 2021-08-22 DIAGNOSIS — R2681 Unsteadiness on feet: Secondary | ICD-10-CM | POA: Diagnosis not present

## 2021-08-22 DIAGNOSIS — I480 Paroxysmal atrial fibrillation: Secondary | ICD-10-CM | POA: Diagnosis not present

## 2021-08-22 DIAGNOSIS — R471 Dysarthria and anarthria: Secondary | ICD-10-CM | POA: Diagnosis not present

## 2021-08-22 DIAGNOSIS — Z794 Long term (current) use of insulin: Secondary | ICD-10-CM | POA: Diagnosis not present

## 2021-08-22 DIAGNOSIS — E119 Type 2 diabetes mellitus without complications: Secondary | ICD-10-CM | POA: Diagnosis not present

## 2021-08-27 DIAGNOSIS — R471 Dysarthria and anarthria: Secondary | ICD-10-CM | POA: Diagnosis not present

## 2021-08-27 DIAGNOSIS — E119 Type 2 diabetes mellitus without complications: Secondary | ICD-10-CM | POA: Diagnosis not present

## 2021-08-27 DIAGNOSIS — I480 Paroxysmal atrial fibrillation: Secondary | ICD-10-CM | POA: Diagnosis not present

## 2021-08-27 DIAGNOSIS — R2681 Unsteadiness on feet: Secondary | ICD-10-CM | POA: Diagnosis not present

## 2021-08-27 DIAGNOSIS — I69354 Hemiplegia and hemiparesis following cerebral infarction affecting left non-dominant side: Secondary | ICD-10-CM | POA: Diagnosis not present

## 2021-08-27 DIAGNOSIS — Z794 Long term (current) use of insulin: Secondary | ICD-10-CM | POA: Diagnosis not present

## 2021-08-29 ENCOUNTER — Telehealth: Payer: Self-pay | Admitting: Family Medicine

## 2021-08-29 NOTE — Telephone Encounter (Signed)
Patient stated that he needed a refill for rOPINIRole (REQUIP) 1 MG tablet [[id number]]  to be sent to his pharmacy.  Patient could be contacted at [phone].  Please advise.

## 2021-08-30 DIAGNOSIS — R471 Dysarthria and anarthria: Secondary | ICD-10-CM | POA: Diagnosis not present

## 2021-08-30 DIAGNOSIS — I69354 Hemiplegia and hemiparesis following cerebral infarction affecting left non-dominant side: Secondary | ICD-10-CM | POA: Diagnosis not present

## 2021-08-30 DIAGNOSIS — R2681 Unsteadiness on feet: Secondary | ICD-10-CM | POA: Diagnosis not present

## 2021-08-30 DIAGNOSIS — I480 Paroxysmal atrial fibrillation: Secondary | ICD-10-CM | POA: Diagnosis not present

## 2021-08-30 DIAGNOSIS — E119 Type 2 diabetes mellitus without complications: Secondary | ICD-10-CM | POA: Diagnosis not present

## 2021-08-30 DIAGNOSIS — Z794 Long term (current) use of insulin: Secondary | ICD-10-CM | POA: Diagnosis not present

## 2021-08-31 DIAGNOSIS — R2681 Unsteadiness on feet: Secondary | ICD-10-CM | POA: Diagnosis not present

## 2021-08-31 DIAGNOSIS — E119 Type 2 diabetes mellitus without complications: Secondary | ICD-10-CM | POA: Diagnosis not present

## 2021-08-31 DIAGNOSIS — Z794 Long term (current) use of insulin: Secondary | ICD-10-CM | POA: Diagnosis not present

## 2021-08-31 DIAGNOSIS — I480 Paroxysmal atrial fibrillation: Secondary | ICD-10-CM | POA: Diagnosis not present

## 2021-08-31 DIAGNOSIS — R471 Dysarthria and anarthria: Secondary | ICD-10-CM | POA: Diagnosis not present

## 2021-08-31 DIAGNOSIS — I69354 Hemiplegia and hemiparesis following cerebral infarction affecting left non-dominant side: Secondary | ICD-10-CM | POA: Diagnosis not present

## 2021-08-31 NOTE — Telephone Encounter (Signed)
Pt is calling to check on refill request. Pt said this medication was originally prescribed while he was in rehab by dr Murvin Natal

## 2021-08-31 NOTE — Telephone Encounter (Signed)
Pt wife call back and she is aware it can take up to 3 business day for refills

## 2021-09-03 DIAGNOSIS — R471 Dysarthria and anarthria: Secondary | ICD-10-CM | POA: Diagnosis not present

## 2021-09-03 DIAGNOSIS — R2681 Unsteadiness on feet: Secondary | ICD-10-CM | POA: Diagnosis not present

## 2021-09-03 DIAGNOSIS — I480 Paroxysmal atrial fibrillation: Secondary | ICD-10-CM | POA: Diagnosis not present

## 2021-09-03 DIAGNOSIS — I69354 Hemiplegia and hemiparesis following cerebral infarction affecting left non-dominant side: Secondary | ICD-10-CM | POA: Diagnosis not present

## 2021-09-03 DIAGNOSIS — E119 Type 2 diabetes mellitus without complications: Secondary | ICD-10-CM | POA: Diagnosis not present

## 2021-09-03 DIAGNOSIS — Z794 Long term (current) use of insulin: Secondary | ICD-10-CM | POA: Diagnosis not present

## 2021-09-03 NOTE — Telephone Encounter (Signed)
Patient called to follow up on  rOPINIRole (REQUIP) 1 MG tablet [[id number]]  being sent to the pharmacy. I let patient know that it had been sent to [facility] and it may take a little longer for it to be filled as it was prescribed by a different doctor. Patient states that he needs it for his leg because it is bothering him at night and he is unable to sleep. Patient has been out of prescription for the past three days.    Please send to  [facility], [place] Phone:  [phone]  Fax:  [phone]        Please advise

## 2021-09-04 ENCOUNTER — Encounter: Payer: Self-pay | Admitting: Family Medicine

## 2021-09-04 ENCOUNTER — Other Ambulatory Visit: Payer: Self-pay | Admitting: Family Medicine

## 2021-09-04 NOTE — Progress Notes (Signed)
Subjective:    Patient ID: Jimmy Carr, male    DOB: 1941-07-05, 80 y.o.   MRN: [id number]  Chief Complaint  Patient presents with   Edema    Feet and knee  Pt accompanied by his wife.  HPI Patient was seen today for ongoing concern.  Pt's wife expresses concern regarding pt's LE edema, LLE>RLE and color change of legs and feet.  [facility] nurse was concerned pt has cellulitis.  Pt working with PT.  Medications adjusted at last OFV, pt no longer feeling sleeping during therapy.  Having urinary frequency.  Using lantus and humalog.    Past Medical History:  Diagnosis Date   AF (atrial fibrillation) (HCC)    2D ECHO, 03/01/1987 - normal; NUCLEAR STRESS TEST, 04/10/2006 - EF 65%, no ischemia   Bruit    CAROTID DOPPLER, 09/20/2008 - normal, no evidence of diameter reduction, significant tortuousity or any other vascular abnormality   Cerebrovascular accident [facility]) 2003   history of small vessal lacunar stroke 2003   Diabetes mellitus type II    Glaucoma    Hyperlipidemia    Hypertension    Other specified cardiac dysrhythmias(427.89)    PSVT   Peripheral neuropathy     Allergies  Allergen Reactions   Cheratussin Ac [Guaifenesin-Codeine] Nausea And Vomiting   Verapamil     ROS General: Denies fever, chills, night sweats, changes in weight, changes in appetite HEENT: Denies headaches, ear pain, changes in vision, rhinorrhea, sore throat CV: Denies CP, palpitations, SOB, orthopnea  +LE edema Pulm: Denies SOB, cough, wheezing GI: Denies abdominal pain, nausea, vomiting, diarrhea, constipation GU: Denies dysuria, hematuria, frequency, vaginal discharge Msk: Denies muscle cramps, joint pains Neuro: Denies weakness, numbness, tingling Skin: Denies rashes, bruising  +skin changes, blisters Psych: Denies depression, anxiety, hallucinations    Objective:    Blood pressure 118/82, pulse 77, temperature 97.7 F (36.5 C), temperature source Oral, SpO2 96 %.  Gen. Pleasant,  well-nourished, in no distress, normal affect   HEENT: [place]/AT, face symmetric, conjunctiva clear, no scleral icterus, PERRLA, EOMI, nares patent without drainage Lungs: no accessory muscle use, CTAB, no wheezes or rales Cardiovascular: RRR, no m/r/g, bilateral LE edema L>R. Musculoskeletal: L sided hemiparesis.  No deformities, no cyanosis or clubbing. Neuro:  A&Ox3, CN II-XII intact, gait not assessed as sitting in transport wheelchair. Skin:  Warm, dry, intact.  Bilateral LE edema with hyperpigmentation and thickening of skin.  No erythema, increased warmth, or induration noted. Calluses on feet.   Wt Readings from Last 3 Encounters:  08/08/20 249 lb (112.9 kg)  08/04/20 246 lb (111.6 kg)  07/14/20 246 lb 6.4 oz (111.8 kg)    Lab Results  Component Value Date   WBC 8.8 08/04/2020   HGB 16.6 08/04/2020   HCT 51.0 08/04/2020   PLT 166 08/04/2020   GLUCOSE 140 (H) 08/04/2020   CHOL 136 03/02/2020   TRIG 147.0 03/02/2020   HDL 36.00 (L) 03/02/2020   LDLDIRECT 67.0 05/16/2015   LDLCALC 70 03/02/2020   ALT 21 07/14/2020   AST 17 07/14/2020   NA 139 08/04/2020   K 3.9 08/04/2020   CL 102 08/04/2020   CREATININE 1.36 (H) 08/04/2020   BUN 20 08/04/2020   CO2 25 08/04/2020   TSH 1.86 12/31/2017   PSA 1.92 03/02/2020   HGBA1C 7.9 (H) 03/02/2020   MICROALBUR 2.0 (H) 12/31/2017    Assessment/Plan:  Type 2 diabetes mellitus with diabetic neuropathy, with long-term current use of insulin (HCC) -Controlled -Hemoglobin A1c  6.9% on 06/26/2021 -Continue current meds including Humalog 50 units at breakfast 35 at lunch and 50 at dinner, and Lantus 60 units nightly -Discussed referral to podiatry for toenail trimming and callus paring.  Patient declines at this time. -Needs to schedule diabetic retinopathy screening  - Plan: For Home Use Only DME [facility]  Essential hypertension  -Controlled -Will discontinue hydrochlorothiazide as not effective in reducing LE edema. -We will  start Lasix 20 mg daily -Continue lisinopril 40 mg daily and Lopressor 12.5 mg twice daily -Continue to monitor blood pressure as adjustments may be needed. - Plan: furosemide (LASIX) 20 MG tablet  PAF (paroxysmal atrial fibrillation) (HCC) -Controlled -Continue Lopressor 12.5 mg twice daily -Continue Xarelto 20 mg  Venous stasis dermatitis of both lower extremities -As previously discussed likely causing edema in bilateral LEs. -Discussed supportive care including compression socks or TED hose, elevation, decreasing sodium intake -Will discontinue hydrochlorothiazide 25 mg daily. -Will start Lasix 20 mg daily.  Advised patient may notice increased urination.  Bilateral lower extremity edema -Supportive care as discussed above  F/u prn  Grier Mitts, MD

## 2021-09-05 ENCOUNTER — Telehealth: Payer: Self-pay | Admitting: Family Medicine

## 2021-09-05 DIAGNOSIS — R471 Dysarthria and anarthria: Secondary | ICD-10-CM | POA: Diagnosis not present

## 2021-09-05 DIAGNOSIS — I480 Paroxysmal atrial fibrillation: Secondary | ICD-10-CM | POA: Diagnosis not present

## 2021-09-05 DIAGNOSIS — R2681 Unsteadiness on feet: Secondary | ICD-10-CM | POA: Diagnosis not present

## 2021-09-05 DIAGNOSIS — I69354 Hemiplegia and hemiparesis following cerebral infarction affecting left non-dominant side: Secondary | ICD-10-CM | POA: Diagnosis not present

## 2021-09-05 DIAGNOSIS — Z794 Long term (current) use of insulin: Secondary | ICD-10-CM | POA: Diagnosis not present

## 2021-09-05 DIAGNOSIS — E119 Type 2 diabetes mellitus without complications: Secondary | ICD-10-CM | POA: Diagnosis not present

## 2021-09-05 NOTE — Telephone Encounter (Signed)
Jimmy Carr from [facility] call and stated she need a order fax to [facility] for outpatient rehab the # is [phone] and Jimmy Carr # is [phone].

## 2021-09-06 NOTE — Telephone Encounter (Signed)
No orders have been rec'd for outpatient rehab, only inpatient rehab orders. All inpatient orders have be returned by fax, fax confirmation rec'd for all.

## 2021-09-06 NOTE — Telephone Encounter (Signed)
Refill was provided.

## 2021-09-12 ENCOUNTER — Ambulatory Visit (INDEPENDENT_AMBULATORY_CARE_PROVIDER_SITE_OTHER): Payer: Medicare Other | Admitting: Family Medicine

## 2021-09-12 VITALS — BP 138/86 | HR 81 | Temp 97.5°F

## 2021-09-12 DIAGNOSIS — Z794 Long term (current) use of insulin: Secondary | ICD-10-CM

## 2021-09-12 DIAGNOSIS — Z8673 Personal history of transient ischemic attack (TIA), and cerebral infarction without residual deficits: Secondary | ICD-10-CM | POA: Diagnosis not present

## 2021-09-12 DIAGNOSIS — R600 Localized edema: Secondary | ICD-10-CM

## 2021-09-12 DIAGNOSIS — I69354 Hemiplegia and hemiparesis following cerebral infarction affecting left non-dominant side: Secondary | ICD-10-CM | POA: Diagnosis not present

## 2021-09-12 DIAGNOSIS — E1140 Type 2 diabetes mellitus with diabetic neuropathy, unspecified: Secondary | ICD-10-CM

## 2021-09-12 DIAGNOSIS — I10 Essential (primary) hypertension: Secondary | ICD-10-CM

## 2021-09-12 MED ORDER — POTASSIUM CHLORIDE CRYS ER 20 MEQ PO TBCR: 20.0000 meq | EXTENDED_RELEASE_TABLET | Freq: Every day | ORAL | 3 refills | Status: DC

## 2021-09-12 MED ORDER — HYDROCHLOROTHIAZIDE 25 MG PO TABS: 25.0000 mg | ORAL_TABLET | Freq: Every day | ORAL | 3 refills | Status: DC

## 2021-09-12 NOTE — Progress Notes (Signed)
Subjective:    Patient ID: Jimmy Carr, male    DOB: 1940-12-03, 80 y.o.   MRN: [id number]  Chief Complaint  Patient presents with   Follow-up    Paperwork. Left side is irritated from the insulin injections. Lasix, but finished the HCTZ, foot got better, not urinating as much, wants to stick with HCTZ, does not want to take the lasix unless has to   Pt accompanied by his wife and daughter.  HPI Patient was seen today for f/u.  Since last OFV, pt states he never started lasix.  Urinary frequency started improving as pt has been up moving a little more.  States wants to continue HCTZ.  Notes mild increase in LE edema today.  At times cannot get shoes on.  Finished home PT/OT.  Wants to do outpt PT.  Trying to get diabetic shoes.  Will need forms completed before they can be ordered.  Past Medical History:  Diagnosis Date   AF (atrial fibrillation) (HCC)    2D ECHO, 03/01/1987 - normal; NUCLEAR STRESS TEST, 04/10/2006 - EF 65%, no ischemia   Bruit    CAROTID DOPPLER, 09/20/2008 - normal, no evidence of diameter reduction, significant tortuousity or any other vascular abnormality   Cerebrovascular accident [facility]) 2003   history of small vessal lacunar stroke 2003   Diabetes mellitus type II    Glaucoma    Hyperlipidemia    Hypertension    Other specified cardiac dysrhythmias(427.89)    PSVT   Peripheral neuropathy     Allergies  Allergen Reactions   Cheratussin Ac [Guaifenesin-Codeine] Nausea And Vomiting   Verapamil     ROS General: Denies fever, chills, night sweats, changes in weight, changes in appetite HEENT: Denies headaches, ear pain, changes in vision, rhinorrhea, sore throat CV: Denies CP, palpitations, SOB, orthopnea Pulm: Denies SOB, cough, wheezing GI: Denies abdominal pain, nausea, vomiting, diarrhea, constipation GU: Denies dysuria, hematuria, frequency Msk: Denies muscle cramps, joint pains  +LE edema L>R Neuro: Denies weakness, numbness, tingling Skin: Denies  rashes, bruising Psych: Denies depression, anxiety, hallucinations     Objective:    Blood pressure 138/86, pulse 81, temperature (!) 97.5 F (36.4 C), temperature source Oral, SpO2 97 %.  Gen. Pleasant, well-nourished, in no distress, normal affect   HEENT: [place]/AT, face symmetric, conjunctiva clear, no scleral icterus, PERRLA, EOMI, nares patent without drainage Lungs: no accessory muscle use, CTAB, no wheezes or rales Cardiovascular: RRR, no m/r/g, 1+ edema in LLE to mid shin.  Trace in RLE Abdomen: BS present, soft, NT/ND Musculoskeletal: residual LUE and LLE weakness.  No deformities, no cyanosis or clubbing, normal tone Neuro:  A&Ox3, CN II-XII intact, normal gait Skin:  Warm, no lesions/ rash  Wt Readings from Last 3 Encounters:  08/08/20 249 lb (112.9 kg)  08/04/20 246 lb (111.6 kg)  07/14/20 246 lb 6.4 oz (111.8 kg)    Lab Results  Component Value Date   WBC 8.8 08/04/2020   HGB 16.6 08/04/2020   HCT 51.0 08/04/2020   PLT 166 08/04/2020   GLUCOSE 140 (H) 08/04/2020   CHOL 136 03/02/2020   TRIG 147.0 03/02/2020   HDL 36.00 (L) 03/02/2020   LDLDIRECT 67.0 05/16/2015   LDLCALC 70 03/02/2020   ALT 21 07/14/2020   AST 17 07/14/2020   NA 139 08/04/2020   K 3.9 08/04/2020   CL 102 08/04/2020   CREATININE 1.36 (H) 08/04/2020   BUN 20 08/04/2020   CO2 25 08/04/2020   TSH 1.86 12/31/2017  PSA 1.92 03/02/2020   HGBA1C 7.9 (H) 03/02/2020   MICROALBUR 2.0 (H) 12/31/2017   Diabetic Foot Exam - Simple   Simple Foot Form Diabetic Foot exam was performed with the following findings: Yes 08/17/2021 11:30 AM  Visual Inspection See comments: Yes Sensation Testing Intact to touch and monofilament testing bilaterally: Yes See comments: Yes Pulse Check Posterior Tibialis and Dorsalis pulse intact bilaterally: Yes Comments B/l LE edema L>R.  Hyperpigmentation of skin.  Calluses on plantar surface of b/l feet.  Thickened toenails.  Slightly diminished monofilament testing  b/l.     Assessment/Plan:  Bilateral lower extremity edema  -slight increase today, but improving -previously on HCTZ.  Given rx for lasix at last OFV, but never started med. -will have pt take a dose of lasix 20 mg today until can pick up rx for HCTZ. -will then d/c lasix 20 mg as pt has not been taking it. -restart HCTZ 25 mg daily.  Will start Kdur 20 mEq potassium supplement daily to be taken with HCTZ. -Continue supportive care including elevation, compression, decreasing sodium intake. - Plan: hydrochlorothiazide (HYDRODIURIL) 25 MG tablet, potassium chloride SA (KLOR-CON M) 20 MEQ tablet  History of CVA (cerebrovascular accident) -Continue statin -bp control encouraged.  Essential hypertension -Elevated.  Likely 2/2 not taking diuretic. -Discussed the importance of lifestyle occasions -Continue lisinopril 40 mg, metoprolol tartrate 25 mg twice daily -Will d/c Lasix 20 mg daily as patient never started medication. -We will restart HCTZ 25 mg daily and K. Dur 20 mEq - Plan: hydrochlorothiazide (HYDRODIURIL) 25 MG tablet  Type 2 diabetes mellitus with diabetic neuropathy, with long-term current use of insulin (HCC) -Controlled -Hemoglobin A1c 6.9% on 05/05/2021 during hospitalization. -Continue lifestyle modifications -Continue Lantus 60 units nightly, log 50 units with breakfast, 35 units with lunch, 50 units with dinner. -foot exam at last OFV -will complete forms for diabetic shoes when received by company.  Hemiparesis affecting left side as late residual effect of cerebrovascular accident [facility]) -continue exercises at home. -completed HH PT.   -Continue OT. -referral placed for neuro rehab as requested by pt's wife.  F/u as needed in the next 2-3 months.  Grier Mitts, MD

## 2021-09-14 ENCOUNTER — Telehealth: Payer: Self-pay | Admitting: Family Medicine

## 2021-09-14 NOTE — Telephone Encounter (Signed)
Pt was seen on 09-12-2021 and is calling to ask why he was prescribed new medication potassium. Please advise

## 2021-09-21 ENCOUNTER — Encounter: Payer: Self-pay | Admitting: Family Medicine

## 2021-09-21 NOTE — Telephone Encounter (Signed)
The reason was written on the prescription bottle "to be taken with HCTZ to prevent low potassium".

## 2021-09-26 NOTE — Telephone Encounter (Signed)
Spoke with pt, is aware. 

## 2021-10-09 ENCOUNTER — Telehealth: Payer: Self-pay | Admitting: Rehabilitative and Restorative Service Providers"

## 2021-10-09 ENCOUNTER — Other Ambulatory Visit: Payer: Self-pay

## 2021-10-09 ENCOUNTER — Encounter: Payer: Self-pay | Admitting: Rehabilitative and Restorative Service Providers"

## 2021-10-09 ENCOUNTER — Ambulatory Visit: Payer: Medicare Other | Attending: Family Medicine | Admitting: Rehabilitative and Restorative Service Providers"

## 2021-10-09 DIAGNOSIS — M6281 Muscle weakness (generalized): Secondary | ICD-10-CM | POA: Diagnosis not present

## 2021-10-09 DIAGNOSIS — G8190 Hemiplegia, unspecified affecting unspecified side: Secondary | ICD-10-CM

## 2021-10-09 DIAGNOSIS — R2689 Other abnormalities of gait and mobility: Secondary | ICD-10-CM | POA: Diagnosis not present

## 2021-10-09 DIAGNOSIS — R293 Abnormal posture: Secondary | ICD-10-CM | POA: Diagnosis not present

## 2021-10-09 DIAGNOSIS — I69354 Hemiplegia and hemiparesis following cerebral infarction affecting left non-dominant side: Secondary | ICD-10-CM | POA: Diagnosis not present

## 2021-10-09 DIAGNOSIS — R2681 Unsteadiness on feet: Secondary | ICD-10-CM | POA: Insufficient documentation

## 2021-10-09 NOTE — Telephone Encounter (Signed)
Dr. Volanda Napoleon, Cleta Alberts was evaluated by PT today.  The patient would benefit from OT evaluation for L hemiparesis s/p CVA.    He initially wanted [facility] rehab specialists, however, they visisted therapy locations and scheduled at the [facility]-- we only have PT at that location. We discussed that he needs OT as well and could do this at [facility] location or in [place] and we could have an order sent to [facility] rehab specialists.  They want to continue within [facility] and will go to [facility].  If you agree, please place an order in [facility] workque for OT. Thank you, Rudell Cobb, [place], [facility] [address] [place], [place]  [address] Phone:  [phone] Fax:  [phone]

## 2021-10-09 NOTE — Therapy (Signed)
[place] [facility], [place], [address] Phone: [phone]   Fax:  [phone]  Physical Therapy Evaluation  Patient Details  Name: Jimmy Carr MRN: [id number] Date of Birth: 06-27-1941 Referring Provider (PT): Grier Mitts, MD   Encounter Date: 10/09/2021   PT End of Session - 10/09/21 1403     Visit Number 1    Number of Visits 17    Date for PT Re-Evaluation 12/08/21    Authorization Type medicare    Progress Note Due on Visit 10    PT Start Time 0935    PT Stop Time 1025    PT Time Calculation (min) 50 min    Equipment Utilized During Treatment Gait belt    Activity Tolerance Patient tolerated treatment well;Patient limited by fatigue    Behavior During Therapy [facility] for tasks assessed/performed             Past Medical History:  Diagnosis Date   AF (atrial fibrillation) ([place])    2D ECHO, 03/01/1987 - normal; NUCLEAR STRESS TEST, 04/10/2006 - EF 65%, no ischemia   Bruit    CAROTID DOPPLER, 09/20/2008 - normal, no evidence of diameter reduction, significant tortuousity or any other vascular abnormality   Cerebrovascular accident [facility]) 2003   history of small vessal lacunar stroke 2003   Diabetes mellitus type II    Glaucoma    Hyperlipidemia    Hypertension    Other specified cardiac dysrhythmias(427.89)    PSVT   Peripheral neuropathy     Past Surgical History:  Procedure Laterality Date   ANAL FISSURE REPAIR     CARDIAC CATHETERIZATION  02/02/2001   Normal LV systolic function, questionable mitral valve prolapse without mitral regurgitation, no evidence of CAD   CARDIAC CATHETERIZATION  09/13/2008   Minimal CAD, normal LV systolic function, no evidence of renal artery stenosis, distal aortic disease, or iliac disease    There were no vitals filed for this visit.    Subjective Assessment - 10/09/21 0938     Subjective The patient is s/p CVA on 05/06/21 with L hemiparesis.  He was  initially treated in acute care, then IP rehab and then [facility].  He is now referred to OP PT.  He c/o pain in the L knee with exercise and this is also his weaker side.  He gets some occasional swelling in the L knee and weakness that hinders his ability to safely do the steps.  At this time his wife is providing 24 hour care.  He needs assist for all activities. Patient's daughter assists and is home about 75% of the time.    Patient is accompained by: Family member   wife, Inez Catalina and daughter, Christianne Dolin   Pertinent History torn ligaments in L knee, diabetes, HTN, h/o shingles, h/o CVA, a-fib    Patient Stated Goals "be able to go up the steps to use the bathroom" *He lives in a split level and has 4 steps. He is using a bedside commode.    Currently in Pain? Yes    Pain Score --   mild to moderate   Pain Location Knee    Pain Orientation Left    Pain Descriptors / Indicators Aching;Discomfort    Pain Type Chronic pain    Pain Onset More than a month ago    Aggravating Factors  worse in morning due to being stiff,    Pain Relieving Factors gentle movement, weight bearing can hurt  [facility] PT Assessment - 10/09/21 0949       Assessment   Medical Diagnosis late effect of CVA    Referring Provider (PT) Grier Mitts, MD    Onset Date/Surgical Date 05/06/21    Hand Dominance Right    Prior Therapy IP rehab and home health      Precautions   Precautions Fall      Restrictions   Weight Bearing Restrictions No      Balance Screen   Has the patient fallen in the past 6 months Yes    How many times? 1 time onto the bed    Has the patient had a decrease in activity level because of a fear of falling?  Yes   due to stroke   Is the patient reluctant to leave their home because of a fear of falling?  No      Home Ecologist residence    Living Arrangements Spouse/significant other    Home Access Ramped entrance    Home Layout Multi-level   split level    Alternate Level Stairs-Number of Steps [place] - 2 wheels;Cane - quad;Bedside commode   L platform,   Additional Comments needs 24 hour supervision      Prior Function   Level of Independence Independent    Vocation Retired   had a prior stroke 20 years ago   Leisure more sedentary lifestyle, did grocery store      Cognition   Overall Cognitive Status --   follows commands during therapy well     Observation/Other Assessments   Focus on Therapeutic Outcomes (FOTO)  n/a- excluded dx at our site      Sensation   Light Touch Impaired Detail    Additional Comments neuropathy bilaterally in feet; patient notes equal sensation with light touch screening in L UE/LE      Functional Tests   Functional tests Sit to Stand      Sit to Stand   Comments PT provides mod A with daughter there for assist to strap L UE onto platform RW      Posture/Postural Control   Posture/Postural Control Postural limitations    Postural Limitations Rounded Shoulders   elevated L shoulder during standing tasks     Tone   Assessment Location Left Upper Extremity      ROM / Strength   AROM / PROM / Strength AROM;Strength      AROM   Overall AROM  Deficits    Overall AROM Comments *L UE is held in elbow flexion with wrist flexion with increased tone.  He has pain in the L wrist with coming into neutral position passively.  His elbow is able to extend passively.  Bilateral hamstring tightness limits knee extension in sitting, as well as L quad weakness.      Strength   Overall Strength Deficits    Overall Strength Comments L hemi-motor impairments.  Patient appears WNLs R LE/UE for strength.  He has dec'd quad engagement during standing leading to instability in L knee during stance phase of gait.  Patient is able to initiate movement in L UE.  His movement pattern is characterized by L shoulder shrug with proximal movement compensating for lack of distal control.  The patient has dec'd L  ankle DF and is not able to fully lift against gravity. General MMT L LE is 2+/5.      Flexibility   Soft Tissue  Assessment /Muscle Length yes    Hamstrings SIGNIFICANT bilateral tightness in bilateral HS and gastroc; note general tightness t/o bilat LEs in hooklying.      Bed Mobility   Bed Mobility Supine to Sit;Sit to Supine   family notes this is easier at home with bed rail.   Supine to Sit Moderate Assistance - Patient 50-74%    Sit to Supine Moderate Assistance - Patient 50-74%      Transfers   Transfers Sit to Stand;Stand to Sit;Lateral/Scoot Transfers    Sit to Stand 3: Mod assist    Stand to Sit 3: Mod assist   needs help to bring L arm down from platform RW   Stand to Sit Details (indicate cue type and reason) Tactile cues for weight shifting;Manual facilitation for weight shifting;Manual facilitation for weight bearing    Lateral/Scoot Transfers 3: Mod assist    Lateral/Scoot Transfer Details (indicate cue type and reason) w/c<>mat      Ambulation/Gait   Ambulation/Gait Yes    Ambulation/Gait Assistance 3: Mod assist   daughter, Christianne Dolin, was a stand by assist and kept hand on belt as well   Ambulation/Gait Assistance Details L knee unstable with PT providing tactile cues to engage quad before loading in stance phase    Ambulation Distance (Feet) 30 Feet    Assistive device Left platform walker    Gait Pattern Step-to pattern;Decreased stance time - left;Decreased step length - right;Decreased stride length;Decreased dorsiflexion - left;Decreased weight shift to left;Lateral hip instability;Poor foot clearance - left    Ambulation Surface Level;Indoor      Balance   Balance Assessed Yes      Static Sitting Balance   Static Sitting - Balance Support No upper extremity supported    Static Sitting - Level of Assistance 7: Independent      Static Standing Balance   Static Standing - Balance Support Bilateral upper extremity supported    Static Standing - Level of Assistance 3:  Mod assist    Static Standing - Comment/# of Minutes Needs mod A to maintain standing with platform RW      Standardized Balance Assessment   Standardized Balance Assessment Berg Balance Test      Berg Balance Test   Sit to Stand Needs moderate or maximal assist to stand    Standing Unsupported Unable to stand 30 seconds unassisted    Sitting with Back Unsupported but Feet Supported on Floor or Stool Able to sit safely and securely 2 minutes    Stand to Sit Needs assistance to sit    Transfers Needs two people to assist of supervise to be safe    Standing Unsupported with Eyes Closed Needs help to keep from falling    Standing Unsupported with Feet Together Needs help to attain position and unable to hold for 15 seconds    From Standing, Reach Forward with Outstretched Arm Loses balance while trying/requires external support    From Standing Position, Pick up Object from Floor Unable to try/needs assist to keep balance    From Standing Position, Turn to Look Behind Over each Shoulder Needs assist to keep from losing balance and falling    Turn 360 Degrees Needs assistance while turning    Standing Unsupported, Alternately Place Feet on Step/Stool Needs assistance to keep from falling or unable to try    Standing Unsupported, One Foot in [employer] balance while stepping or standing    Standing on One Leg Unable to try or needs  assist to prevent fall    Total Score 4      LUE Tone   LUE Tone Hypertonic;Moderate      LUE Tone   Hypertonic Details maintains L elbow flexion that can straighten with sustained pressure; with wrist motion from flexion to neutral, patient c/o pain                        Objective measurements completed on examination: See above findings.                PT Education - 10/09/21 1402     Education Details nature of PT, need for OT, locations available to offer multi-D services (in [facility] and out of [facility] system to accommodate  patient's needs)    Person(s) Educated Patient;Spouse;Child(ren)    Methods Explanation    Comprehension Verbalized understanding              PT Short Term Goals - 10/09/21 1404       PT SHORT TERM GOAL #1   Title The patient will be able to perform HEP with assist from family.    Time 4    Period Weeks    Target Date 11/08/21      PT SHORT TERM GOAL #2   Title The patient will move sit<>stand with CGA to L PFRW.    Time 4    Period Weeks    Target Date 11/08/21      PT SHORT TERM GOAL #3   Title The patient will move sit<>supine with supervision in therapy (without bar he needs at home) to demo improving mobility.    Time 4    Period Weeks    Target Date 11/08/21      PT SHORT TERM GOAL #4   Title The patient will ambulate with L PFRW x 100 feet with min A.    Time 4    Period Weeks    Target Date 11/08/21      PT SHORT TERM GOAL #5   Title The patient will be further assessed on stairs and goal to follow.    Time 4    Period Weeks    Target Date 11/08/21               PT Long Term Goals - 10/09/21 1407       PT LONG TERM GOAL #1   Title The patient will be able to perform HEP progression with min A from family.    Time 8    Period Weeks    Target Date 12/08/21      PT LONG TERM GOAL #2   Title The patient will move sit<>stand with supervision to RW and fasten L UE strap for PFRW use.    Time 8    Period Weeks    Target Date 12/08/21      PT LONG TERM GOAL #3   Title The patient will ambulate for household distances with L PFRW x 150 ft with CGA without loss of balance.    Time 8    Period Weeks    Target Date 12/08/21      PT LONG TERM GOAL #4   Title The patient will negotiate 4 steps with R sided handrail and CGA to be able to access his bathroom at home.    Time 8    Period Weeks    Target Date 12/08/21      PT LONG TERM  GOAL #5   Title The patient will imrpove Berg balance score from 4/56 to > or equla to 16/56 to demo improving  steady state balance for ADLs and mobility.    Time 8    Period Weeks    Target Date 12/08/21                    Plan - 10/09/21 1415     Clinical Impression Statement The patient is a 81 year old male presenting to OP physical therapy with hemimotor impairments s/p CVA 05/06/2021. He presents with L UE spasticity, bilateral dec'd flexibility LEs, postural rounding, low back pain, L knee pain, L knee instability in standing, L LE weakness, dec'd isolated L UE motion, dec'd balance and unsteady gait.  This leads to dependence for all functional mobility requiring 24 hour supervision.  The patient is unable to access the bathroom at his home due to 4 steps in a split level home.  He has significant UE needs and ADL needs and will benefit from referral to OT (PT to send note to MD requesting).  PT to address patient impairments and functional limitations working on optimizing current functional status post stroke.    Personal Factors and Comorbidities Time since onset of injury/illness/exacerbation;Comorbidity 3+    Comorbidities diabetes, HTN, a-fib, low back pain    Examination-Activity Limitations Bed Mobility;Bathing;Locomotion Level;Transfers;Reach Overhead;Bend;Dressing;Hygiene/Grooming;Lift;Stand;Toileting;Stairs;Squat;Sit    Examination-Participation Restrictions Church;Cleaning;Community Activity;Driving;Interpersonal Relationship;Medication Management;Yard Work    Merchant navy officer Evolving/Moderate complexity    Clinical Decision Making Moderate    Rehab Potential Good    PT Frequency 2x / week    PT Duration 8 weeks    PT Treatment/Interventions ADLs/Self Care Home Management;Aquatic Therapy;Cryotherapy;Electrical Stimulation;Iontophoresis 4mg /ml Dexamethasone;Moist Heat;DME Instruction;Gait training;Stair training;Functional mobility training;Therapeutic activities;Therapeutic exercise;Balance training;Neuromuscular re-education;Patient/family education;Orthotic  Fit/Training;Wheelchair mobility training;Manual techniques;Dry needling;Taping    PT Next Visit Plan Establish HEP for L knee strengthening (quad engagement), sit<>stand with family assist, hamstring stretching, LE strength.  Work on sit to stand, standing weight shift emphasizing knee control (block L knee), gait training.    Consulted and Agree with Plan of Care Patient             Patient will benefit from skilled therapeutic intervention in order to improve the following deficits and impairments:  Pain, Abnormal gait, Decreased range of motion, Difficulty walking, Impaired tone, Increased fascial restricitons, Decreased endurance, Impaired UE functional use, Decreased activity tolerance, Decreased balance, Impaired flexibility, Hypomobility, Decreased mobility, Decreased strength, Impaired sensation, Postural dysfunction  Visit Diagnosis: Muscle weakness (generalized)  Other abnormalities of gait and mobility  Unsteadiness on feet  Abnormal posture     Problem List Patient Active Problem List   Diagnosis Date Noted   Dizziness 12/10/2018   Pure hypercholesterolemia 04/21/2018   Hx of adenomatous colonic polyps 12/31/2017   Lightheadedness 03/20/2017   H/O: stroke 03/20/2017   Splenomegaly 11/01/2015   Chronic liver disease 11/01/2015   Hollenhorst plaque, right eye 05/11/2015   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 ([place]) 05/11/2015   Nuclear cataract of both eyes 10/04/2014   Primary open angle glaucoma of both eyes, severe stage 02/14/2014   Chest pain 11/02/2013   CAD in native artery 11/02/2013   PAF (paroxysmal atrial fibrillation) ([place]) 03/16/2013   Erectile dysfunction 03/11/2011   SHINGLES 03/28/2010   URINARY FREQUENCY 11/07/2009   RUQ PAIN 01/24/2009   Diabetes mellitus with neuropathy ([place]) 02/24/2007   Hyperlipidemia 02/24/2007   PERIPHERAL NEUROPATHY  02/24/2007   Essential hypertension  02/24/2007   History of cardiovascular disorder 02/24/2007    Rudell Cobb, PT 10/09/2021, 2:22 PM  [facility] [place], [place], [address] Phone: [phone]   Fax:  [phone]  Name: MONTY SPICHER MRN: [id number] Date of Birth: [birth year]

## 2021-10-11 ENCOUNTER — Other Ambulatory Visit: Payer: Self-pay

## 2021-10-11 ENCOUNTER — Ambulatory Visit: Payer: Medicare Other | Admitting: Physical Therapy

## 2021-10-11 DIAGNOSIS — R2681 Unsteadiness on feet: Secondary | ICD-10-CM

## 2021-10-11 DIAGNOSIS — M6281 Muscle weakness (generalized): Secondary | ICD-10-CM | POA: Diagnosis not present

## 2021-10-11 DIAGNOSIS — I69354 Hemiplegia and hemiparesis following cerebral infarction affecting left non-dominant side: Secondary | ICD-10-CM | POA: Diagnosis not present

## 2021-10-11 DIAGNOSIS — R293 Abnormal posture: Secondary | ICD-10-CM

## 2021-10-11 DIAGNOSIS — R2689 Other abnormalities of gait and mobility: Secondary | ICD-10-CM | POA: Diagnosis not present

## 2021-10-11 NOTE — Therapy (Signed)
[place], [place], [address] Phone: [phone]   Fax:  [phone]  Physical Therapy Treatment  Patient Details  Name: Jimmy Carr MRN: [id number] Date of Birth: 1940/10/21 Referring Provider (PT): Grier Mitts, MD   Encounter Date: 10/11/2021   PT End of Session - 10/11/21 1405     Visit Number 2    Number of Visits 17    Date for PT Re-Evaluation 12/08/21    Authorization Type medicare    Progress Note Due on Visit 10    PT Start Time 1406    PT Stop Time 9774    PT Time Calculation (min) 39 min    Equipment Utilized During Treatment Gait belt    Activity Tolerance Patient tolerated treatment well;Patient limited by fatigue    Behavior During Therapy [facility] for tasks assessed/performed             Past Medical History:  Diagnosis Date   AF (atrial fibrillation) ([place])    2D ECHO, 03/01/1987 - normal; NUCLEAR STRESS TEST, 04/10/2006 - EF 65%, no ischemia   Bruit    CAROTID DOPPLER, 09/20/2008 - normal, no evidence of diameter reduction, significant tortuousity or any other vascular abnormality   Cerebrovascular accident [facility]) 2003   history of small vessal lacunar stroke 2003   Diabetes mellitus type II    Glaucoma    Hyperlipidemia    Hypertension    Other specified cardiac dysrhythmias(427.89)    PSVT   Peripheral neuropathy     Past Surgical History:  Procedure Laterality Date   ANAL FISSURE REPAIR     CARDIAC CATHETERIZATION  02/02/2001   Normal LV systolic function, questionable mitral valve prolapse without mitral regurgitation, no evidence of CAD   CARDIAC CATHETERIZATION  09/13/2008   Minimal CAD, normal LV systolic function, no evidence of renal artery stenosis, distal aortic disease, or iliac disease    There were no vitals filed for this visit.   Subjective Assessment - 10/11/21 1445     Subjective Reports no new changes or falls.    Patient is accompained by:  Family member   wife, Inez Catalina and daughter, Christianne Dolin   Pertinent History torn ligaments in L knee, diabetes, HTN, h/o shingles, h/o CVA, a-fib    Patient Stated Goals "be able to go up the steps to use the bathroom" *He lives in a split level and has 4 steps. He is using a bedside commode.    Pain Onset More than a month ago                [facility] PT Assessment - 10/11/21 0001       Assessment   Medical Diagnosis late effect of CVA    Referring Provider (PT) Grier Mitts, MD    Onset Date/Surgical Date 05/06/21    Hand Dominance Right                           [facility] Adult PT Treatment/Exercise - 10/11/21 0001       Transfers   Sit to Stand 3: Mod assist;4: Min guard    Sit to Stand Details (indicate cue type and reason) Cues to scoot further forward and increase forward weight/trunk lean   min A by end of session   Stand to Sit 4: Min assist;3: Mod assist    Stand to Sit Details (indicate cue type and reason) Tactile cues for weight shifting;Manual facilitation  for weight shifting;Manual facilitation for weight bearing      Ambulation/Gait   Ambulation/Gait Assistance 3: Mod assist   Wife pulling w/c behind   Ambulation/Gait Assistance Details Tactile and verbal cueing for quad and hip extension    Ambulation Distance (Feet) 90 Feet    Assistive device Left platform walker    Gait Pattern Step-to pattern;Step-through pattern;Decreased dorsiflexion - left;Poor foot clearance - left;Lateral hip instability;Decreased step length - right;Decreased stride length;Decreased weight shift to left   improved to step through pattern by end of lapping the gym   Ambulation Surface Level;Indoor      Exercises   Exercises Knee/Hip      Knee/Hip Exercises: Stretches   Passive Hamstring Stretch 30 seconds;Left;2 reps      Knee/Hip Exercises: Standing   Hip Abduction 2 sets;10 reps;Right;Left    Hip Extension Stengthening;2 sets;10 reps;Knee bent    SLS with Vectors Forward tap  2x10 each side    Other Standing Knee Exercises L<>R weight shift with quad set on L x10      Knee/Hip Exercises: Seated   Sit to Sand 5 reps;with UE support                 Balance Exercises - 10/11/21 0001       Balance Exercises: Standing   Standing Eyes Opened Solid surface   x10 arm raises, x10 arm reaches, x30 sec static standing               PT Education - 10/11/21 1636     Education Details Discussed with family stretching pt prior to ambulation and see if this helps with some of his knee instability. Discussed HEP updates.    Person(s) Educated Patient;Spouse;Child(ren)    Methods Explanation;Demonstration;Tactile cues;Verbal cues;Handout    Comprehension Verbalized understanding;Returned demonstration;Verbal cues required;Tactile cues required;Need further instruction              PT Short Term Goals - 10/09/21 1404       PT SHORT TERM GOAL #1   Title The patient will be able to perform HEP with assist from family.    Time 4    Period Weeks    Target Date 11/08/21      PT SHORT TERM GOAL #2   Title The patient will move sit<>stand with CGA to L PFRW.    Time 4    Period Weeks    Target Date 11/08/21      PT SHORT TERM GOAL #3   Title The patient will move sit<>supine with supervision in therapy (without bar he needs at home) to demo improving mobility.    Time 4    Period Weeks    Target Date 11/08/21      PT SHORT TERM GOAL #4   Title The patient will ambulate with L PFRW x 100 feet with min A.    Time 4    Period Weeks    Target Date 11/08/21      PT SHORT TERM GOAL #5   Title The patient will be further assessed on stairs and goal to follow.    Time 4    Period Weeks    Target Date 11/08/21               PT Long Term Goals - 10/09/21 1407       PT LONG TERM GOAL #1   Title The patient will be able to perform HEP progression with min A from family.  Time 8    Period Weeks    Target Date 12/08/21      PT LONG  TERM GOAL #2   Title The patient will move sit<>stand with supervision to RW and fasten L UE strap for PFRW use.    Time 8    Period Weeks    Target Date 12/08/21      PT LONG TERM GOAL #3   Title The patient will ambulate for household distances with L PFRW x 150 ft with CGA without loss of balance.    Time 8    Period Weeks    Target Date 12/08/21      PT LONG TERM GOAL #4   Title The patient will negotiate 4 steps with R sided handrail and CGA to be able to access his bathroom at home.    Time 8    Period Weeks    Target Date 12/08/21      PT LONG TERM GOAL #5   Title The patient will imrpove Berg balance score from 4/56 to > or equla to 16/56 to demo improving steady state balance for ADLs and mobility.    Time 8    Period Weeks    Target Date 12/08/21                   Plan - 10/11/21 1637     Clinical Impression Statement Treatment focused on continuing to improve L LE ROM via stretching and strengthening. Increased time spent in standing this session with large focus on weight shifting on L LE and performing sit<>stand with less assistance. Pt able to amb full loop around gym with a step through pattern by end of session. Pt does have some ankle inversion -- discussed possible consideration of AFO to further assist with foot clearance. Currently still waiting for OT referral.    Personal Factors and Comorbidities Time since onset of injury/illness/exacerbation;Comorbidity 3+    Comorbidities diabetes, HTN, a-fib, low back pain    Examination-Activity Limitations Bed Mobility;Bathing;Locomotion Level;Transfers;Reach Overhead;Bend;Dressing;Hygiene/Grooming;Lift;Stand;Toileting;Stairs;Squat;Sit    Examination-Participation Restrictions Church;Cleaning;Community Activity;Driving;Interpersonal Relationship;Medication Management;Yard Work    [employer]chant [employer] officer Evolving/Moderate complexity    Rehab Potential Good    PT Frequency 2x / week    PT Duration 8  weeks    PT Treatment/Interventions ADLs/Self Care Home Management;Aquatic Therapy;Cryotherapy;Electrical Stimulation;Iontophoresis 4mg /ml Dexamethasone;Moist Heat;DME Instruction;Gait training;Stair training;Functional mobility training;Therapeutic activities;Therapeutic exercise;Balance training;Neuromuscular re-education;Patient/family education;Orthotic Fit/Training;Wheelchair mobility training;Manual techniques;Dry needling;Taping    PT Next Visit Plan Continue HEP for L knee strengthening (quad engagement), sit<>stand with family assist, hamstring stretching, LE strength.  Work on sit to stand, standing weight shift emphasizing knee control (block L knee), gait training.    PT Home Exercise Plan Access Code [email or web address]    [email or web address] and Agree with Plan of Care Patient;Family member/caregiver    Family Member Consulted Wife             Patient will benefit from skilled therapeutic intervention in order to improve the following deficits and impairments:  Pain, Abnormal gait, Decreased range of motion, Difficulty walking, Impaired tone, Increased fascial restricitons, Decreased endurance, Impaired UE functional use, Decreased activity tolerance, Decreased balance, Impaired flexibility, Hypomobility, Decreased mobility, Decreased strength, Impaired sensation, Postural dysfunction  Visit Diagnosis: Muscle weakness (generalized)  Other abnormalities of gait and mobility  Unsteadiness on feet  Abnormal posture     Problem List Patient Active Problem List   Diagnosis Date Noted   Dizziness 12/10/2018   Pure hypercholesterolemia 04/21/2018   Hx of  adenomatous colonic polyps 12/31/2017   Lightheadedness 03/20/2017   H/O: stroke 03/20/2017   Splenomegaly 11/01/2015   Chronic liver disease 11/01/2015   Hollenhorst plaque, right eye 05/11/2015   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 ([place]) 05/11/2015   Nuclear cataract of  both eyes 10/04/2014   Primary open angle glaucoma of both eyes, severe stage 02/14/2014   Chest pain 11/02/2013   CAD in native artery 11/02/2013   PAF (paroxysmal atrial fibrillation) ([place]) 03/16/2013   Erectile dysfunction 03/11/2011   SHINGLES 03/28/2010   URINARY FREQUENCY 11/07/2009   RUQ PAIN 01/24/2009   Diabetes mellitus with neuropathy ([place]) 02/24/2007   Hyperlipidemia 02/24/2007   PERIPHERAL NEUROPATHY 02/24/2007   Essential hypertension 02/24/2007   History of cardiovascular disorder 02/24/2007    [facility] April Gordy Levan, [place], DPT 10/11/2021, 5:48 PM  [facility] [place] [address] [place], [place], [address] Phone: [phone]   Fax:  [phone]  Name: ELYON ZOLL MRN: [id number] Date of Birth: [birth year]

## 2021-10-12 ENCOUNTER — Telehealth: Payer: Self-pay | Admitting: Rehabilitative and Restorative Service Providers"

## 2021-10-12 NOTE — Telephone Encounter (Signed)
PT called primary care MD requesting order for OT due to L hemimotor impairments.

## 2021-10-12 NOTE — Telephone Encounter (Signed)
Okay for referral?

## 2021-10-15 ENCOUNTER — Other Ambulatory Visit: Payer: Self-pay

## 2021-10-15 ENCOUNTER — Other Ambulatory Visit: Payer: Self-pay | Admitting: Family Medicine

## 2021-10-15 ENCOUNTER — Ambulatory Visit: Payer: Medicare Other | Admitting: Physical Therapy

## 2021-10-15 DIAGNOSIS — M6281 Muscle weakness (generalized): Secondary | ICD-10-CM | POA: Diagnosis not present

## 2021-10-15 DIAGNOSIS — R293 Abnormal posture: Secondary | ICD-10-CM | POA: Diagnosis not present

## 2021-10-15 DIAGNOSIS — E7849 Other hyperlipidemia: Secondary | ICD-10-CM

## 2021-10-15 DIAGNOSIS — R2689 Other abnormalities of gait and mobility: Secondary | ICD-10-CM | POA: Diagnosis not present

## 2021-10-15 DIAGNOSIS — G8190 Hemiplegia, unspecified affecting unspecified side: Secondary | ICD-10-CM

## 2021-10-15 DIAGNOSIS — R2681 Unsteadiness on feet: Secondary | ICD-10-CM

## 2021-10-15 DIAGNOSIS — I69354 Hemiplegia and hemiparesis following cerebral infarction affecting left non-dominant side: Secondary | ICD-10-CM | POA: Diagnosis not present

## 2021-10-15 NOTE — Therapy (Signed)
[place], [place], [address] Phone: [phone]   Fax:  [phone]  Physical Therapy Treatment  Patient Details  Name: Jimmy Carr MRN: [id number] Date of Birth: 08-Aug-1941 Referring Provider (PT): Grier Mitts, MD   Encounter Date: 10/15/2021   PT End of Session - 10/15/21 1342     Visit Number 3    Number of Visits 17    Date for PT Re-Evaluation 12/08/21    Authorization Type medicare    Progress Note Due on Visit 10    PT Start Time 1345    PT Stop Time 1430    PT Time Calculation (min) 45 min    Equipment Utilized During Treatment Gait belt    Activity Tolerance Patient tolerated treatment well;Patient limited by fatigue    Behavior During Therapy [facility] for tasks assessed/performed             Past Medical History:  Diagnosis Date   AF (atrial fibrillation) ([place])    2D ECHO, 03/01/1987 - normal; NUCLEAR STRESS TEST, 04/10/2006 - EF 65%, no ischemia   Bruit    CAROTID DOPPLER, 09/20/2008 - normal, no evidence of diameter reduction, significant tortuousity or any other vascular abnormality   Cerebrovascular accident [facility]) 2003   history of small vessal lacunar stroke 2003   Diabetes mellitus type II    Glaucoma    Hyperlipidemia    Hypertension    Other specified cardiac dysrhythmias(427.89)    PSVT   Peripheral neuropathy     Past Surgical History:  Procedure Laterality Date   ANAL FISSURE REPAIR     CARDIAC CATHETERIZATION  02/02/2001   Normal LV systolic function, questionable mitral valve prolapse without mitral regurgitation, no evidence of CAD   CARDIAC CATHETERIZATION  09/13/2008   Minimal CAD, normal LV systolic function, no evidence of renal artery stenosis, distal aortic disease, or iliac disease    There were no vitals filed for this visit.   Subjective Assessment - 10/15/21 1432     Subjective Wife reports difficulty for pt to shift weight to L LE to perform  his standing exercises.    Patient is accompained by: Family member   wife, Inez Catalina and daughter, Christianne Dolin   Pertinent History torn ligaments in L knee, diabetes, HTN, h/o shingles, h/o CVA, a-fib    Patient Stated Goals "be able to go up the steps to use the bathroom" *He lives in a split level and has 4 steps. He is using a bedside commode.    Currently in Pain? No/denies    Pain Onset More than a month ago                               [facility] Adult PT Treatment/Exercise - 10/15/21 0001       Transfers   Sit to Stand 4: Min assist    Sit to Stand Details (indicate cue type and reason) cues to scoot forward and increase forward weight shift    Stand to Sit 3: Mod assist    Stand to Sit Details assist for controlled descent    Lateral/Scoot Transfers 4: Min assist    Lateral/Scoot Transfer Details (indicate cue type and reason) w/c <> mat      Ambulation/Gait   Ambulation/Gait Assistance 3: Mod assist    Ambulation Distance (Feet) 45 Feet    Assistive device Left platform walker    Gait Pattern  Step-to pattern;Step-through pattern;Decreased dorsiflexion - left;Poor foot clearance - left;Lateral hip instability;Decreased step length - right;Decreased stride length;Decreased weight shift to left   only able to manage early reciprocal gait and not full reciprocal pattern this session   Ambulation Surface Level;Indoor      Knee/Hip Exercises: Hydrologist 30 seconds;Left;2 reps    Gastroc Stretch 3 reps;30 seconds      Knee/Hip Exercises: Standing   Hip Abduction Right;10 reps;Knee straight    Hip Extension Right;10 reps;Knee straight    SLS with Vectors Forward tap x10 on right    Other Standing Knee Exercises L<>R weight shift with quad set on L x10      Knee/Hip Exercises: Seated   Sit to Sand 5 reps;with UE support      Knee/Hip Exercises: Supine   Other Supine Knee/Hip Exercises PNF D1 AROM x10                 Balance Exercises  - 10/15/21 0001       Balance Exercises: Standing   Standing Eyes Opened Solid surface;30 secs                  PT Short Term Goals - 10/09/21 1404       PT SHORT TERM GOAL #1   Title The patient will be able to perform HEP with assist from family.    Time 4    Period Weeks    Target Date 11/08/21      PT SHORT TERM GOAL #2   Title The patient will move sit<>stand with CGA to L PFRW.    Time 4    Period Weeks    Target Date 11/08/21      PT SHORT TERM GOAL #3   Title The patient will move sit<>supine with supervision in therapy (without bar he needs at home) to demo improving mobility.    Time 4    Period Weeks    Target Date 11/08/21      PT SHORT TERM GOAL #4   Title The patient will ambulate with L PFRW x 100 feet with min A.    Time 4    Period Weeks    Target Date 11/08/21      PT SHORT TERM GOAL #5   Title The patient will be further assessed on stairs and goal to follow.    Time 4    Period Weeks    Target Date 11/08/21               PT Long Term Goals - 10/09/21 1407       PT LONG TERM GOAL #1   Title The patient will be able to perform HEP progression with min A from family.    Time 8    Period Weeks    Target Date 12/08/21      PT LONG TERM GOAL #2   Title The patient will move sit<>stand with supervision to RW and fasten L UE strap for PFRW use.    Time 8    Period Weeks    Target Date 12/08/21      PT LONG TERM GOAL #3   Title The patient will ambulate for household distances with L PFRW x 150 ft with CGA without loss of balance.    Time 8    Period Weeks    Target Date 12/08/21      PT LONG TERM GOAL #4   Title The  patient will negotiate 4 steps with R sided handrail and CGA to be able to access his bathroom at home.    Time 8    Period Weeks    Target Date 12/08/21      PT LONG TERM GOAL #5   Title The patient will imrpove Berg balance score from 4/56 to > or equla to 16/56 to demo improving steady state balance for  ADLs and mobility.    Time 8    Period Weeks    Target Date 12/08/21                   Plan - 10/15/21 1443     Clinical Impression Statement Treatment focused on improving L LE weight shift and weight bearing during standing and transfers. First attempt with standing hip abduction, pt with difficulty maintaining hip extension causing L knee flexion/buckling. Provided hip flexor stretches this session  -- limited due to tone. Worked on improving sit to stand with min A. Pt requires cueing for increased forward lean/weight shift. Pt fatigues quickly and requires rest breaks between exercises. Pt now has OT eval; will transition to [facility] clinic.    Personal Factors and Comorbidities Time since onset of injury/illness/exacerbation;Comorbidity 3+    Comorbidities diabetes, HTN, a-fib, low back pain    Examination-Activity Limitations Bed Mobility;Bathing;Locomotion Level;Transfers;Reach Overhead;Bend;Dressing;Hygiene/Grooming;Lift;Stand;Toileting;Stairs;Squat;Sit    Examination-Participation Restrictions Church;Cleaning;Community Activity;Driving;Interpersonal Relationship;Medication Management;Yard Work    Merchant [employer] officer Evolving/Moderate complexity    Rehab Potential Good    PT Frequency 2x / week    PT Duration 8 weeks    PT Treatment/Interventions ADLs/Self Care Home Management;Aquatic Therapy;Cryotherapy;Electrical Stimulation;Iontophoresis 4mg /ml Dexamethasone;Moist Heat;DME Instruction;Gait training;Stair training;Functional mobility training;Therapeutic activities;Therapeutic exercise;Balance training;Neuromuscular re-education;Patient/family education;Orthotic Fit/Training;Wheelchair mobility training;Manual techniques;Dry needling;Taping    PT Next Visit Plan Continue HEP for L knee strengthening (quad engagement), sit<>stand with family assist, hamstring stretching, LE strength.  Work on sit to stand, standing weight shift emphasizing knee control (block L  knee), gait training.    PT Home Exercise Plan Access Code [email or web address]    [email or web address] and Agree with Plan of Care Patient;Family member/caregiver    Family Member Consulted Wife             Patient will benefit from skilled therapeutic intervention in order to improve the following deficits and impairments:  Pain, Abnormal gait, Decreased range of motion, Difficulty walking, Impaired tone, Increased fascial restricitons, Decreased endurance, Impaired UE functional use, Decreased activity tolerance, Decreased balance, Impaired flexibility, Hypomobility, Decreased mobility, Decreased strength, Impaired sensation, Postural dysfunction  Visit Diagnosis: Hemiparesis, unspecified hemiparesis etiology, unspecified laterality (HCC)  Muscle weakness (generalized)  Other abnormalities of gait and mobility  Unsteadiness on feet  Abnormal posture     Problem List Patient Active Problem List   Diagnosis Date Noted   Dizziness 12/10/2018   Pure hypercholesterolemia 04/21/2018   Hx of adenomatous colonic polyps 12/31/2017   Lightheadedness 03/20/2017   H/O: stroke 03/20/2017   Splenomegaly 11/01/2015   Chronic liver disease 11/01/2015   Hollenhorst plaque, right eye 05/11/2015   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 ([place]) 05/11/2015   Nuclear cataract of both eyes 10/04/2014   Primary open angle glaucoma of both eyes, severe stage 02/14/2014   Chest pain 11/02/2013   CAD in native artery 11/02/2013   PAF (paroxysmal atrial fibrillation) ([place]) 03/16/2013   Erectile dysfunction 03/11/2011   SHINGLES 03/28/2010   URINARY FREQUENCY 11/07/2009   RUQ PAIN 01/24/2009   Diabetes  mellitus with neuropathy ([place]) 02/24/2007   Hyperlipidemia 02/24/2007   PERIPHERAL NEUROPATHY 02/24/2007   Essential hypertension 02/24/2007   History of cardiovascular disorder 02/24/2007    [facility] Gordy Levan, [place], DPT 10/15/2021, 3:40  PM  [facility] [place], [place], [address] Phone: [phone]   Fax:  [phone]  Name: YAHYA BOLDMAN MRN: [id number] Date of Birth: [birth year]

## 2021-10-18 ENCOUNTER — Encounter: Payer: Self-pay | Admitting: Occupational Therapy

## 2021-10-18 ENCOUNTER — Telehealth: Payer: Self-pay

## 2021-10-18 ENCOUNTER — Other Ambulatory Visit: Payer: Self-pay

## 2021-10-18 ENCOUNTER — Ambulatory Visit: Payer: Medicare Other | Admitting: Rehabilitative and Restorative Service Providers"

## 2021-10-18 ENCOUNTER — Ambulatory Visit: Payer: Medicare Other | Admitting: Occupational Therapy

## 2021-10-18 VITALS — BP 153/98 | HR 78

## 2021-10-18 VITALS — BP 155/100 | HR 80

## 2021-10-18 DIAGNOSIS — Z7901 Long term (current) use of anticoagulants: Secondary | ICD-10-CM | POA: Diagnosis not present

## 2021-10-18 DIAGNOSIS — Z7984 Long term (current) use of oral hypoglycemic drugs: Secondary | ICD-10-CM | POA: Diagnosis not present

## 2021-10-18 DIAGNOSIS — R293 Abnormal posture: Secondary | ICD-10-CM | POA: Diagnosis not present

## 2021-10-18 DIAGNOSIS — E119 Type 2 diabetes mellitus without complications: Secondary | ICD-10-CM | POA: Diagnosis not present

## 2021-10-18 DIAGNOSIS — I69354 Hemiplegia and hemiparesis following cerebral infarction affecting left non-dominant side: Secondary | ICD-10-CM

## 2021-10-18 DIAGNOSIS — R2689 Other abnormalities of gait and mobility: Secondary | ICD-10-CM | POA: Diagnosis not present

## 2021-10-18 DIAGNOSIS — R2681 Unsteadiness on feet: Secondary | ICD-10-CM

## 2021-10-18 DIAGNOSIS — I6622 Occlusion and stenosis of left posterior cerebral artery: Secondary | ICD-10-CM | POA: Diagnosis not present

## 2021-10-18 DIAGNOSIS — E785 Hyperlipidemia, unspecified: Secondary | ICD-10-CM | POA: Diagnosis not present

## 2021-10-18 DIAGNOSIS — R600 Localized edema: Secondary | ICD-10-CM

## 2021-10-18 DIAGNOSIS — G459 Transient cerebral ischemic attack, unspecified: Secondary | ICD-10-CM | POA: Diagnosis not present

## 2021-10-18 DIAGNOSIS — M25632 Stiffness of left wrist, not elsewhere classified: Secondary | ICD-10-CM

## 2021-10-18 DIAGNOSIS — I639 Cerebral infarction, unspecified: Secondary | ICD-10-CM | POA: Diagnosis not present

## 2021-10-18 DIAGNOSIS — I4891 Unspecified atrial fibrillation: Secondary | ICD-10-CM | POA: Diagnosis not present

## 2021-10-18 DIAGNOSIS — M6281 Muscle weakness (generalized): Secondary | ICD-10-CM | POA: Diagnosis not present

## 2021-10-18 DIAGNOSIS — Z79899 Other long term (current) drug therapy: Secondary | ICD-10-CM | POA: Diagnosis not present

## 2021-10-18 DIAGNOSIS — I10 Essential (primary) hypertension: Secondary | ICD-10-CM | POA: Diagnosis not present

## 2021-10-18 DIAGNOSIS — I6602 Occlusion and stenosis of left middle cerebral artery: Secondary | ICD-10-CM | POA: Diagnosis not present

## 2021-10-18 DIAGNOSIS — R531 Weakness: Secondary | ICD-10-CM | POA: Diagnosis not present

## 2021-10-18 DIAGNOSIS — Z794 Long term (current) use of insulin: Secondary | ICD-10-CM | POA: Diagnosis not present

## 2021-10-18 DIAGNOSIS — G8190 Hemiplegia, unspecified affecting unspecified side: Secondary | ICD-10-CM

## 2021-10-18 NOTE — Telephone Encounter (Signed)
---  caller states his blood pressure is averaging 150/100. usually 130/90. right hand went numb 4-5 days ago. comes and goes. right now is slightly numb.  10/18/2021 9:28:11 AM Call EMS 911 Now [place], RN, Estill Bamberg  10/18/21 1148: Wife states they did not call 911 b/c pt didn't want to sit in the waiting room for a long time b/c it made him anxious.  BP 153/103 this am approx 11a. Pt states he's taking his bp meds as prescribed.  Denies chest pain, SOB, or h/a. Pt states intermittent right hand numbness/tingling x 1week. Currently, right hand is fine. Pt states his plan was to go to rehab appt today & then go to the ED. Will keep [place] updated.

## 2021-10-18 NOTE — Telephone Encounter (Signed)
Agree 

## 2021-10-18 NOTE — Therapy (Signed)
[facility] [place], [place], [place], [address] Phone: [phone]   Fax:  [phone]  Physical Therapy Treatment  Patient Details  Name: Jimmy Carr MRN: [id number] Date of Birth: 03/05/1941 Referring Provider (PT): Grier Mitts, MD   Encounter Date: 10/18/2021   PT End of Session - 10/18/21 1430     Visit Number 3   no visit completed today   Number of Visits 17    Date for PT Re-Evaluation 12/08/21    Authorization Type medicare    Progress Note Due on Visit 10    Equipment Utilized During Treatment Gait belt    Activity Tolerance Patient tolerated treatment well;Patient limited by fatigue    Behavior During Therapy [facility] for tasks assessed/performed             Past Medical History:  Diagnosis Date   AF (atrial fibrillation) ([place])    2D ECHO, 03/01/1987 - normal; NUCLEAR STRESS TEST, 04/10/2006 - EF 65%, no ischemia   Bruit    CAROTID DOPPLER, 09/20/2008 - normal, no evidence of diameter reduction, significant tortuousity or any other vascular abnormality   Cerebrovascular accident [facility]) 2003   history of small vessal lacunar stroke 2003   Diabetes mellitus type II    Glaucoma    Hyperlipidemia    Hypertension    Other specified cardiac dysrhythmias(427.89)    PSVT   Peripheral neuropathy     Past Surgical History:  Procedure Laterality Date   ANAL FISSURE REPAIR     CARDIAC CATHETERIZATION  02/02/2001   Normal LV systolic function, questionable mitral valve prolapse without mitral regurgitation, no evidence of CAD   CARDIAC CATHETERIZATION  09/13/2008   Minimal CAD, normal LV systolic function, no evidence of renal artery stenosis, distal aortic disease, or iliac disease    Vitals:   10/18/21 1407  BP: (!) 155/100  Pulse: 80     Subjective Assessment - 10/18/21 1406     Subjective The patient has been having HTN of 150/110 also associated with R UE numbness and tingling.  He has been in contact  with his MD office.    Pertinent History torn ligaments in L knee, diabetes, HTN, h/o shingles, h/o CVA, a-fib    Patient Stated Goals "be able to go up the steps to use the bathroom" *He lives in a split level and has 4 steps. He is using a bedside commode.    Currently in Pain? No/denies             BP=150/100 Discussed what patient's MD recommended and his caregiver/spouse notes it was recommended to go to the ED.  PT educated patient that we would wait since exercise can temporarily increase BP and his MD recommendation is to go to the ED.                 PT Short Term Goals - 10/09/21 1404       PT SHORT TERM GOAL #1   Title The patient will be able to perform HEP with assist from family.    Time 4    Period Weeks    Target Date 11/08/21      PT SHORT TERM GOAL #2   Title The patient will move sit<>stand with CGA to L PFRW.    Time 4    Period Weeks    Target Date 11/08/21      PT SHORT TERM GOAL #3   Title The patient will  move sit<>supine with supervision in therapy (without bar he needs at home) to demo improving mobility.    Time 4    Period Weeks    Target Date 11/08/21      PT SHORT TERM GOAL #4   Title The patient will ambulate with L PFRW x 100 feet with min A.    Time 4    Period Weeks    Target Date 11/08/21      PT SHORT TERM GOAL #5   Title The patient will be further assessed on stairs and goal to follow.    Time 4    Period Weeks    Target Date 11/08/21               PT Long Term Goals - 10/09/21 1407       PT LONG TERM GOAL #1   Title The patient will be able to perform HEP progression with min A from family.    Time 8    Period Weeks    Target Date 12/08/21      PT LONG TERM GOAL #2   Title The patient will move sit<>stand with supervision to RW and fasten L UE strap for PFRW use.    Time 8    Period Weeks    Target Date 12/08/21      PT LONG TERM GOAL #3   Title The patient will ambulate for household distances  with L PFRW x 150 ft with CGA without loss of balance.    Time 8    Period Weeks    Target Date 12/08/21      PT LONG TERM GOAL #4   Title The patient will negotiate 4 steps with R sided handrail and CGA to be able to access his bathroom at home.    Time 8    Period Weeks    Target Date 12/08/21      PT LONG TERM GOAL #5   Title The patient will imrpove Berg balance score from 4/56 to > or equla to 16/56 to demo improving steady state balance for ADLs and mobility.    Time 8    Period Weeks    Target Date 12/08/21                    Patient will benefit from skilled therapeutic intervention in order to improve the following deficits and impairments:     Visit Diagnosis: Hemiparesis, unspecified hemiparesis etiology, unspecified laterality ([place])  Muscle weakness (generalized)  Other abnormalities of gait and mobility  Unsteadiness on feet     Problem List Patient Active Problem List   Diagnosis Date Noted   Dizziness 12/10/2018   Pure hypercholesterolemia 04/21/2018   Hx of adenomatous colonic polyps 12/31/2017   Lightheadedness 03/20/2017   H/O: stroke 03/20/2017   Splenomegaly 11/01/2015   Chronic liver disease 11/01/2015   Hollenhorst plaque, right eye 05/11/2015   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 ([place]) 05/11/2015   Nuclear cataract of both eyes 10/04/2014   Primary open angle glaucoma of both eyes, severe stage 02/14/2014   Chest pain 11/02/2013   CAD in native artery 11/02/2013   PAF (paroxysmal atrial fibrillation) ([place]) 03/16/2013   Erectile dysfunction 03/11/2011   SHINGLES 03/28/2010   URINARY FREQUENCY 11/07/2009   RUQ PAIN 01/24/2009   Diabetes mellitus with neuropathy ([place]) 02/24/2007   Hyperlipidemia 02/24/2007   PERIPHERAL NEUROPATHY 02/24/2007   Essential hypertension 02/24/2007  History of cardiovascular disorder 02/24/2007    Akera Snowberger,  PT 10/18/2021, 2:31 PM  [facility] [address] [place], [place], [address] Phone: [phone]   Fax:  [phone]  Name: Jimmy Carr MRN: [id number] Date of Birth: 04-Mar-1941

## 2021-10-18 NOTE — Therapy (Signed)
[facility] [place], [place], [place], [address] Phone: [phone]   Fax:  [phone]  Occupational Therapy Evaluation  Patient Details  Name: Jimmy Carr MRN: [id number] Date of Birth: [birth year] Referring Provider (OT): [name], MD   Encounter Date: 10/18/2021   OT End of Session - 10/18/21 1326     Visit Number 1    Number of Visits 17    Date for OT Re-Evaluation 12/14/21    Authorization Type Medicare A and B    Authorization - Visit Number 1    Authorization - Number of Visits 17    Progress Note Due on Visit 10    OT Start Time 1320    OT Stop Time 1402    OT Time Calculation (min) 42 min    Activity Tolerance Patient tolerated treatment well    Behavior During Therapy [facility] for tasks assessed/performed             Past Medical History:  Diagnosis Date   AF (atrial fibrillation) ([place])    2D ECHO, 03/01/1987 - normal; NUCLEAR STRESS TEST, 04/10/2006 - EF 65%, no ischemia   Bruit    CAROTID DOPPLER, 09/20/2008 - normal, no evidence of diameter reduction, significant tortuousity or any other vascular abnormality   Cerebrovascular accident [facility]) 2003   history of small vessal lacunar stroke 2003   Diabetes mellitus type II    Glaucoma    Hyperlipidemia    Hypertension    Other specified cardiac dysrhythmias(427.89)    PSVT   Peripheral neuropathy     Past Surgical History:  Procedure Laterality Date   ANAL FISSURE REPAIR     CARDIAC CATHETERIZATION  02/02/2001   Normal LV systolic function, questionable mitral valve prolapse without mitral regurgitation, no evidence of CAD   CARDIAC CATHETERIZATION  09/13/2008   Minimal CAD, normal LV systolic function, no evidence of renal artery stenosis, distal aortic disease, or iliac disease    Vitals:   10/18/21 1323  BP: (!) 153/98  Pulse: 78     Subjective Assessment - 10/19/21 0823     Subjective  Pt presents to OT with reports of tightness  in LUE impacting his ability to engage in functional tasks, grasping, and ROM.  Pt expressing desire to get to the bathroom in his home, which requires him to go up 4 steps in split level home.    Patient is accompanied by: Family member   wife and daughter   Pertinent History torn ligaments in L knee, diabetes, HTN, h/o shingles, h/o CVA, a-fib, low back pain    Patient Stated Goals to be able to have functional grasp in L hand to hold things    Currently in Pain? No/denies               [facility] OT Assessment - 10/18/21 1328       Assessment   Medical Diagnosis Hemiparesis    Referring Provider (OT) [name], MD    Onset Date/Surgical Date 05/06/21    Hand Dominance Right    Prior Therapy IP rehab, [facility]      Precautions   Precautions Fall      Restrictions   Weight Bearing Restrictions No      Balance Screen   Has the patient fallen in the past 6 months No    Has the patient had a decrease in activity level because of a fear of falling?  Yes  Is the patient reluctant to leave their home because of a fear of falling?  No      Home  Environment   Family/patient expects to be discharged to: Private residence    Living Arrangements Spouse/significant other   daughter   Available Help at Discharge Available 24 hours/day    Type of [place] entrance    [place]   4 steps to level with bathroom/bedroom   Bathroom Shower/Tub Tub/Shower unit   sliding glass doors   Bathroom Accessibility Yes    How accessible Accessible via walker    [place] -quad;Bedside commode   [place]   Lives With Spouse;Daughter      Prior Function   Level of [place] Retired   had a prior stroke 20 years ago   Leisure more sedentary lifestyle, did grocery store      ADL   Eating/Feeding Needs assist with cutting food   able to cut with fork in R hand   Upper Body Bathing Moderate assistance   pt washes chest, legs,  perineal area.  Wife washes R arm, back, feet, buttocks   Upper Body Dressing Moderate assistance   wife assists with threading L arm and  pulling over back, pt able to pull over head   Lower Body Dressing Maximal assistance    Toilet Transfer Minimal assistance    Toilet Transfer Method Stand pivot    Toilet Transer Equipment Drop arm bedside commode    Toileting - Clothing Manipulation Maximal assistance    Toileting -  Hygiene Maximal assistance      IADL   Medication Management Is not capable of dispensing or managing own medication      Written Expression   Dominant Hand Right      Vision - History   Baseline Vision Wears glasses all the time      Posture/Postural Control   Posture/Postural Control Postural limitations    Postural Limitations Rounded Shoulders   elevated L shoulder during standing tasks     Sensation   Light Touch Impaired Detail    Additional Comments neuropathy bilaterally in feet; patient notes equal sensation with light touch screening in L UE/LE      Tone   Assessment Location Left Upper Extremity      ROM / Strength   AROM / PROM / Strength AROM;Strength      AROM   Overall AROM  Deficits    Overall AROM Comments L UE is held in elbow flexion with wrist flexion with increased tone.  He has pain in the L wrist with coming into neutral position passively.  His elbow is able to extend passively.  Pt able to achieve -54* active wrist flexion, up to -20* PROM.  Pt able to flex and extend fingers minimally but with no gross grasp      Strength   Overall Strength Deficits    Overall Strength Comments Patient is able to initiate movement in L UE.  His movement pattern is characterized by L shoulder shrug with proximal movement compensating for lack of distal control.      Hand Function   Right Hand Gross Grasp Functional    Left Hand Gross Grasp Impaired   Pt able to flex fingers minimally but unable to grasp     LUE Tone   LUE Tone Hypertonic;Moderate       LUE Tone   Hypertonic Details maintains  L elbow flexion that can straighten with sustained pressure; with wrist motion from flexion to neutral, patient c/o pain                      OT Treatments/Exercises (OP) - 10/19/21 0001       ADLs   ADL Comments Pt and wife reporting pt with recent elevated BP and speaking with PCP who reports to monitor BP and seek medical attention tomorrow if symptoms persist.  Pt reports sensation of tingling in R hand for about a week, but none currently.  Educated on signs/symptoms of stroke and when to seek medical care.                    OT Education - 10/19/21 [phone]     Education Details Educated on signs/symptoms of stroke.    Person(s) Educated Patient;Spouse;Child(ren)    Methods Explanation    Comprehension Verbalized understanding;Need further instruction              OT Short Term Goals - 10/19/21 0841       OT SHORT TERM GOAL #1   Title Pt and caregiver will be independent in PROM/stretching HEP for increased L wrist mobility.    Time 4    Period Weeks    Status New    Target Date 11/16/21      OT SHORT TERM GOAL #2   Title Pt and caregiver will be independent in positioning of LUE during seated and sleep for improved positioning and control of edema.    Time 4    Period Weeks    Status New               OT Long Term Goals - 10/19/21 0844       OT LONG TERM GOAL #1   Title Pt and caregiver will be independent in splint care and wear    Time 8    Period Weeks    Status New    Target Date 12/14/21      OT LONG TERM GOAL #2   Title Pt will demonstrate improved L wrist extension to be able to grasp RW to increase ease with transfers.    Time 8    Period Weeks    Status New      OT LONG TERM GOAL #3   Title Pt will demonstrate improved L grasp to sustain grasp of 3# to maintain grasp on pill bottles to participate in administering medications.    Time 8    Period Weeks    Status New       OT LONG TERM GOAL #4   Title Pt will demonstrate increased shoulder flexion to reach mid range heights (100*) to utilize LUE as gross assist during simple meal prep tasks.    Time 8    Period Weeks    Status New      OT LONG TERM GOAL #5   Title Pt will maintain dynamic standing balance for 5 mins with min assist to engage in LB dressing and toileting hygiene to decrease burden of care.    Time 8    Period Weeks    Status New                   Plan - 10/19/21 0834     Clinical Impression Statement Pt is a 81 y.o. male who presents to OP OT due to LUE/LLE impairments s/p CVA 05/06/2021. Pt demonstrates  decreased LUE Motor, decreased balance, L weakness/instability in standing impacting ability to complete ADLs at PLOF>  Pt currently requires 24 hour supervision and up to mod assist for mobility and self-care tasks of bathing/dressing.  Pt is currently unable to access bathroom at his home due to 4 steps in a split level home, therefore is currently using BSC. Pt currently lives with wife and adult daughter in a split level home, wife is providing 24 hr assist and daughter is helping 75% of the time.  PMHx includes torn ligaments in L knee, diabetes, HTN, h/o shingles, h/o CVA, a-fib, low back pain. Pt will benefit from skilled occupational therapy services to address ROM, strength, pain management, balance, GM control, safety awareness, introduction of compensatory strategies/AE prn, and implementation of an HEP to improve participation and safety during ADLs and IADLs.    OT Occupational Profile and History Detailed Assessment- Review of Records and additional review of physical, cognitive, psychosocial history related to current functional performance    Occupational performance deficits (Please refer to evaluation for details): ADL's;IADL's    Body Structure / Function / Physical Skills ADL;Balance;Body mechanics;Coordination;Decreased knowledge of precautions;Decreased knowledge of  use of DME;Edema;Endurance;Flexibility;FMC;GMC;IADL;Mobility;Pain;Proprioception;ROM;Sensation;Skin integrity;Strength;Tone;UE functional use    Rehab Potential Fair    Clinical Decision Making Several treatment options, min-mod task modification necessary    Comorbidities Affecting Occupational Performance: May have comorbidities impacting occupational performance    Modification or Assistance to Complete Evaluation  Min-Moderate modification of tasks or assist with assess necessary to complete eval    OT Frequency 2x / week    OT Duration 8 weeks    OT Treatment/Interventions Self-care/ADL training;Biofeedback;Cryotherapy;Electrical Stimulation;Moist Heat;Ultrasound;Iontophoresis;Paraffin;Fluidtherapy;Therapeutic exercise;Neuromuscular education;Energy conservation;DME and/or AE instruction;Functional Mobility Training;Manual Therapy;Passive range of motion;Splinting;Therapeutic activities;Patient/family education;Balance training    Plan assess wrist and current splinting options    Consulted and Agree with Plan of Care Patient;Family member/caregiver    Family Member Consulted wife and daughter             Patient will benefit from skilled therapeutic intervention in order to improve the following deficits and impairments:   Body Structure / Function / Physical Skills: ADL, Balance, Body mechanics, Coordination, Decreased knowledge of precautions, Decreased knowledge of use of DME, Edema, Endurance, Flexibility, FMC, GMC, IADL, Mobility, Pain, Proprioception, ROM, Sensation, Skin integrity, Strength, Tone, UE functional use       Visit Diagnosis: Hemiplegia and hemiparesis following cerebral infarction affecting left non-dominant side (HCC)  Muscle weakness (generalized)  Unsteadiness on feet  Stiffness of left wrist, not elsewhere classified  Localized edema    Problem List Patient Active Problem List   Diagnosis Date Noted   Dizziness 12/10/2018   Pure  hypercholesterolemia 04/21/2018   Hx of adenomatous colonic polyps 12/31/2017   Lightheadedness 03/20/2017   H/O: stroke 03/20/2017   Splenomegaly 11/01/2015   Chronic liver disease 11/01/2015   Hollenhorst plaque, right eye 05/11/2015   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 ([place]) 05/11/2015   Nuclear cataract of both eyes 10/04/2014   Primary open angle glaucoma of both eyes, severe stage 02/14/2014   Chest pain 11/02/2013   CAD in native artery 11/02/2013   PAF (paroxysmal atrial fibrillation) ([place]) 03/16/2013   Erectile dysfunction 03/11/2011   SHINGLES 03/28/2010   URINARY FREQUENCY 11/07/2009   RUQ PAIN 01/24/2009   Diabetes mellitus with neuropathy ([place]) 02/24/2007   Hyperlipidemia 02/24/2007   PERIPHERAL NEUROPATHY 02/24/2007   Essential hypertension 02/24/2007   History of cardiovascular disorder  02/24/2007    Simonne Come, OT 10/19/2021, 8:56 AM  [facility] [place] [address], [place], [place], [address] Phone: [phone]   Fax:  [phone]  Name: HITESH FOUCHE MRN: [id number] Date of Birth: [birth year]

## 2021-10-19 DIAGNOSIS — R531 Weakness: Secondary | ICD-10-CM | POA: Diagnosis not present

## 2021-10-22 ENCOUNTER — Telehealth (INDEPENDENT_AMBULATORY_CARE_PROVIDER_SITE_OTHER): Payer: Medicare Other | Admitting: Family Medicine

## 2021-10-22 ENCOUNTER — Encounter: Payer: Self-pay | Admitting: Family Medicine

## 2021-10-22 ENCOUNTER — Telehealth: Payer: Self-pay | Admitting: Family Medicine

## 2021-10-22 VITALS — BP 124/78 | HR 92 | Temp 97.9°F | Wt 240.0 lb

## 2021-10-22 DIAGNOSIS — J069 Acute upper respiratory infection, unspecified: Secondary | ICD-10-CM | POA: Diagnosis not present

## 2021-10-22 DIAGNOSIS — U071 COVID-19: Secondary | ICD-10-CM | POA: Diagnosis not present

## 2021-10-22 MED ORDER — MOLNUPIRAVIR EUA 200MG CAPSULE: 4.0000 | ORAL_CAPSULE | Freq: Two times a day (BID) | ORAL | 0 refills | Status: AC

## 2021-10-22 NOTE — Telephone Encounter (Signed)
Prescription for antiviral molnupiravir sent to patient's pharmacy.  Patient to take 4 pills twice a day for the next 5 days.  Medication is generally well-tolerated though GI symptoms often reported.  Give precautions for continued or worsening symptoms.

## 2021-10-22 NOTE — Telephone Encounter (Signed)
Patient called because he has tested positive for covid and he would like the covid medication sent in to   [id number] [place], [place] Phone:  [phone]  Fax:  [phone]        Please advise

## 2021-10-22 NOTE — Progress Notes (Addendum)
Virtual Visit via Telephone Note  I connected with Jimmy Carr on 10/22/21 at 11:00 AM EST by telephone and verified that I am speaking with the correct person using two identifiers.   I discussed the limitations, risks, security and privacy concerns of performing an evaluation and management service by telephone and the availability of in person appointments. I also discussed with the patient that there may be a patient responsible charge related to this service. The patient expressed understanding and agreed to proceed.  Location patient: home Location provider: work or home office Participants present for the call: patient, provider, pt's wife Patient did not have a visit in the prior 7 days to address this/these issue(s).  Chief Complaint  Patient presents with   Cough    Productive x 2 days.  Has had at least 4 covid vaccines but doesn't have the dates (card) close by.    Sore Throat   Fever    100.1 yesterday      History of Present Illness: Pt with rhinorrhea, ST, productive cough x 2 days. Coricidin cough and cold helping. Denies ear pain/pressure, HA, n/v, diarrhea.   Not as achy today.  Feeling better today. Temp 97.70F this am.  Was 100.76F   Sick contacts possible as pt recently seen at [facility] ED 10/18/21 for TIA.  Pt states BP was spiking.  Pt realized he was not taking the 40 mg of his bp med, but was taking 20 mg.  Thinks it was lisinopril.   Observations/Objective: Patient sounds cheerful and well on the phone. I do not appreciate any SOB. Speech and thought processing are grossly intact. Patient reported vitals:  97.70F this am,  BP this am 124/79  Assessment and Plan: Viral URI with cough -advised to take at home covid test.  Contact clinic if positive. -OTC cough and cold meds such as Coricidin HBP, antihistamine, gargle with warm salt water or Chloraseptic spray.  Consider nasal saline rinse or Flonase nasal spray. -Given strict precautions for continued  or worsening symptoms   Update: Patient's at home COVID test positive.  Continue supportive care as above.  We will send in prescription for molnupiravir to pharmacy.   COVID-19 virus infection Viral URI with cough  Follow Up Instructions:  F/u prn  [id number] 5-10 [id number] 11-20 9443 21-30 I did not refer this patient for an OV in the next 24 hours for this/these issue(s).  I discussed the assessment and treatment plan with the patient. The patient was provided an opportunity to ask questions and all were answered. The patient agreed with the plan and demonstrated an understanding of the instructions.   The patient was advised to call back or seek an in-person evaluation if the symptoms worsen or if the condition fails to improve as anticipated.  I provided 7 minutes of non-face-to-face time during this encounter.   [name], MD

## 2021-10-22 NOTE — Addendum Note (Signed)
Addended by: Grier Mitts R on: 10/22/2021 04:54 PM   Modules accepted: Orders

## 2021-10-23 NOTE — Telephone Encounter (Signed)
I spoke with patient's wife, they have picked up the medication & pt has started it. Will notify us if anything changes.

## 2021-10-24 ENCOUNTER — Encounter: Payer: Medicare Other | Admitting: Occupational Therapy

## 2021-10-24 ENCOUNTER — Ambulatory Visit: Payer: Medicare Other | Admitting: Physical Therapy

## 2021-10-24 ENCOUNTER — Telehealth: Payer: Self-pay | Admitting: Family Medicine

## 2021-10-24 ENCOUNTER — Other Ambulatory Visit: Payer: Self-pay | Admitting: Family Medicine

## 2021-10-24 DIAGNOSIS — U071 COVID-19: Secondary | ICD-10-CM

## 2021-10-24 MED ORDER — BENZONATATE 100 MG PO CAPS
100.0000 mg | ORAL_CAPSULE | Freq: Two times a day (BID) | ORAL | 0 refills | Status: DC | PRN
Start: 2021-10-24 — End: 2022-01-04

## 2021-10-24 NOTE — Telephone Encounter (Signed)
Rx for tessalon sent to pharmacy.

## 2021-10-24 NOTE — Telephone Encounter (Signed)
Patient called to see if cough medication could be called in for him. He states that the coughing is particularly bad at night while trying to sleep.     Please send to  [facility], [place] Phone:  [phone]  Fax:  [phone]        Please advise

## 2021-10-26 ENCOUNTER — Ambulatory Visit: Payer: Medicare Other | Admitting: Rehabilitative and Restorative Service Providers"

## 2021-10-26 ENCOUNTER — Encounter: Payer: Medicare Other | Admitting: Occupational Therapy

## 2021-10-30 ENCOUNTER — Ambulatory Visit: Payer: Medicare Other | Admitting: Occupational Therapy

## 2021-10-30 ENCOUNTER — Ambulatory Visit: Payer: Medicare Other | Admitting: Physical Therapy

## 2021-10-31 ENCOUNTER — Encounter: Payer: Self-pay | Admitting: Family Medicine

## 2021-10-31 ENCOUNTER — Telehealth: Payer: Self-pay | Admitting: Family Medicine

## 2021-10-31 ENCOUNTER — Other Ambulatory Visit: Payer: Self-pay | Admitting: Family Medicine

## 2021-10-31 ENCOUNTER — Telehealth (INDEPENDENT_AMBULATORY_CARE_PROVIDER_SITE_OTHER): Payer: Medicare Other | Admitting: Family Medicine

## 2021-10-31 VITALS — BP 145/94

## 2021-10-31 DIAGNOSIS — R0982 Postnasal drip: Secondary | ICD-10-CM | POA: Diagnosis not present

## 2021-10-31 DIAGNOSIS — R058 Other specified cough: Secondary | ICD-10-CM | POA: Diagnosis not present

## 2021-10-31 DIAGNOSIS — I10 Essential (primary) hypertension: Secondary | ICD-10-CM

## 2021-10-31 DIAGNOSIS — J3489 Other specified disorders of nose and nasal sinuses: Secondary | ICD-10-CM | POA: Diagnosis not present

## 2021-10-31 MED ORDER — FEXOFENADINE HCL 180 MG PO TABS: 180.0000 mg | ORAL_TABLET | Freq: Every day | ORAL | 0 refills | Status: DC

## 2021-10-31 MED ORDER — FLUTICASONE PROPIONATE 50 MCG/ACT NA SUSP: 2.0000 | Freq: Every day | NASAL | 0 refills | Status: DC

## 2021-10-31 NOTE — Progress Notes (Signed)
Virtual Visit via Telephone Note  I connected with Jimmy Carr on 10/31/21 at  2:00 PM EST by telephone and verified that I am speaking with the correct person using two identifiers.   I discussed the limitations, risks, security and privacy concerns of performing an evaluation and management service by telephone and the availability of in person appointments. I also discussed with the patient that there may be a patient responsible charge related to this service. The patient expressed understanding and agreed to proceed.  Location patient: home Location provider: work or home office Participants present for the call: patient, provider Patient did not have a visit in the prior 7 days to address this/these issue(s).   History of Present Illness: Pt is a 81 year old male with pmh sig for A. fib, HTN, h/o CVA, DM2 with peripheral neuropathy and diabetic retinopathy, h/o cataracts, h/o glaucoma who was seen for ongoing concern.  Pt states his nose has been running nonstop for the last 2 wks.  Pt seen 10/22/21 for viral uri symptoms 2/2 COVID 19 infection. S/p molnupiravir.   Drainage causing cough.  Patient has taken OTC Coricidin HBP and Tessalon which has helped.   Observations/Objective: Patient sounds cheerful and well on the phone. I do not appreciate any SOB. Speech and thought processing are grossly intact. Patient reported vitals:  Assessment and Plan: Post-nasal drainage - Plan: fexofenadine (ALLEGRA) 180 MG tablet, fluticasone (FLONASE) 50 MCG/ACT nasal spray  Rhinorrhea  Post-viral cough syndrome  Essential hypertension  Patient with rhinorrhea and post nasal drainage causing cough status post COVID-19 virus infection treated with antiviral Molnupiravir.  Discussed treatment of current symptoms with OTC antihistamine such as Allegra and Flonase nasal spray.  Okay to continue Tessalon as needed and OTC cough drops or plain Robitussin.  Advised to avoid cough/cold medications  with decongestant as it may cause elevation in BP.  Continue current BP medications including lisinopril 40 mg daily, hydrochlorothiazide 25 mg daily, Lopressor 25 mg twice daily.  Given strict precautions.  Follow Up Instructions: F/u prn  [id number] 5-10 [id number] 11-20 9443 21-30 I did not refer this patient for an OV in the next 24 hours for this/these issue(s).  I discussed the assessment and treatment plan with the patient. The patient was provided an opportunity to ask questions and all were answered. The patient agreed with the plan and demonstrated an understanding of the instructions.   The patient was advised to call back or seek an in-person evaluation if the symptoms worsen or if the condition fails to improve as anticipated.  I provided 5:30 minutes of non-face-to-face time during this encounter.   [name], MD

## 2021-10-31 NOTE — Telephone Encounter (Signed)
Patient calling in with respiratory symptoms: Shortness of breath, chest pain, palpitations or other red words send to Triage  Does the patient have a fever over 100, cough, congestion, sore throat, runny nose, lost of taste/smell (please list symptoms that patient has)?start?cough and nasal drainage (If over 5 days ago, pt may be scheduled for in person visit)  Have you tested for Covid in the last 5 days? no  If yes, was it positive []  OR negative [] ? If positive in the last 5 days, please schedule virtual visit now. If negative, schedule for an in person OV with the next available provider if PCP has no openings. Please also let patient know they will be tested again (follow the script below)  "you will have to arrive 49mins prior to your appt time to be Covid tested. Please park in back of office at the cone & call [phone] to let the staff know you have arrived. A staff member will meet you at your car to do a rapid covid test. Once the test has resulted you will be notified by phone of your results to determine if appt will remain an in person visit or be converted to a virtual/phone visit. If you arrive less than 92mins before your appt time, your visit will be automatically converted to virtual & any recommended testing will happen AFTER the visit." Pt have a virtual with dr.Banks today  THINGS TO REMEMBER  If no availability for virtual visit in office,  please schedule another [place] office  If no availability at another [place] office, please instruct patient that they can schedule an evisit or virtual visit through their mychart account. Visits up to 8pm  patients can be seen in office 5 days after positive COVID test

## 2021-11-01 ENCOUNTER — Ambulatory Visit: Payer: Medicare Other | Admitting: Occupational Therapy

## 2021-11-01 ENCOUNTER — Ambulatory Visit: Payer: Medicare Other | Admitting: Rehabilitative and Restorative Service Providers"

## 2021-11-06 ENCOUNTER — Ambulatory Visit: Payer: Medicare Other | Admitting: Occupational Therapy

## 2021-11-06 ENCOUNTER — Ambulatory Visit: Payer: Medicare Other

## 2021-11-08 ENCOUNTER — Ambulatory Visit: Payer: Medicare Other | Admitting: Rehabilitative and Restorative Service Providers"

## 2021-11-08 ENCOUNTER — Ambulatory Visit: Payer: Medicare Other | Admitting: Occupational Therapy

## 2021-11-12 DIAGNOSIS — H2513 Age-related nuclear cataract, bilateral: Secondary | ICD-10-CM | POA: Diagnosis not present

## 2021-11-12 DIAGNOSIS — H401133 Primary open-angle glaucoma, bilateral, severe stage: Secondary | ICD-10-CM | POA: Diagnosis not present

## 2021-11-13 ENCOUNTER — Ambulatory Visit: Payer: Medicare Other

## 2021-11-13 ENCOUNTER — Encounter: Payer: Medicare Other | Admitting: Occupational Therapy

## 2021-11-13 ENCOUNTER — Other Ambulatory Visit: Payer: Self-pay

## 2021-11-13 ENCOUNTER — Ambulatory Visit: Payer: Medicare Other | Attending: Family Medicine | Admitting: Rehabilitative and Restorative Service Providers"

## 2021-11-13 DIAGNOSIS — I69354 Hemiplegia and hemiparesis following cerebral infarction affecting left non-dominant side: Secondary | ICD-10-CM | POA: Diagnosis not present

## 2021-11-13 DIAGNOSIS — R2689 Other abnormalities of gait and mobility: Secondary | ICD-10-CM | POA: Diagnosis not present

## 2021-11-13 DIAGNOSIS — R2681 Unsteadiness on feet: Secondary | ICD-10-CM | POA: Insufficient documentation

## 2021-11-13 DIAGNOSIS — G8190 Hemiplegia, unspecified affecting unspecified side: Secondary | ICD-10-CM | POA: Diagnosis not present

## 2021-11-13 DIAGNOSIS — M6281 Muscle weakness (generalized): Secondary | ICD-10-CM | POA: Insufficient documentation

## 2021-11-13 DIAGNOSIS — R600 Localized edema: Secondary | ICD-10-CM | POA: Diagnosis not present

## 2021-11-13 DIAGNOSIS — M25632 Stiffness of left wrist, not elsewhere classified: Secondary | ICD-10-CM | POA: Diagnosis not present

## 2021-11-13 DIAGNOSIS — R293 Abnormal posture: Secondary | ICD-10-CM | POA: Diagnosis not present

## 2021-11-13 NOTE — Therapy (Signed)
[place], [place], [address] Phone: [phone]   Fax:  [phone]  Physical Therapy Treatment  Patient Details  Name: Jimmy Carr MRN: [id number] Date of Birth: 05-Dec-1940 Referring Provider (PT): Grier Mitts, MD   Encounter Date: 11/13/2021   PT End of Session - 11/13/21 1143     Visit Number 4    Number of Visits 17    Date for PT Re-Evaluation 12/08/21    Authorization Type medicare    Authorization - Visit Number 4    Progress Note Due on Visit 10    PT Start Time 2993    PT Stop Time 1230    PT Time Calculation (min) 45 min    Equipment Utilized During Treatment Gait belt    Activity Tolerance Patient tolerated treatment well;Patient limited by fatigue    Behavior During Therapy [facility] for tasks assessed/performed             Past Medical History:  Diagnosis Date   AF (atrial fibrillation) ([place])    2D ECHO, 03/01/1987 - normal; NUCLEAR STRESS TEST, 04/10/2006 - EF 65%, no ischemia   Bruit    CAROTID DOPPLER, 09/20/2008 - normal, no evidence of diameter reduction, significant tortuousity or any other vascular abnormality   Cerebrovascular accident [facility]) 2003   history of small vessal lacunar stroke 2003   Diabetes mellitus type II    Glaucoma    Hyperlipidemia    Hypertension    Other specified cardiac dysrhythmias(427.89)    PSVT   Peripheral neuropathy     Past Surgical History:  Procedure Laterality Date   ANAL FISSURE REPAIR     CARDIAC CATHETERIZATION  02/02/2001   Normal LV systolic function, questionable mitral valve prolapse without mitral regurgitation, no evidence of CAD   CARDIAC CATHETERIZATION  09/13/2008   Minimal CAD, normal LV systolic function, no evidence of renal artery stenosis, distal aortic disease, or iliac disease    There were no vitals filed for this visit.   Subjective Assessment - 11/13/21 1259     Subjective The patient has had Covid and  has not been as active at home.    Pertinent History torn ligaments in L knee, diabetes, HTN, h/o shingles, h/o CVA, a-fib    Patient Stated Goals "be able to go up the steps to use the bathroom" *He lives in a split level and has 4 steps. He is using a bedside commode.    Currently in Pain? Yes    Pain Location Knee    Pain Orientation Left    Pain Descriptors / Indicators Discomfort;Sore    Pain Type Acute pain    Pain Onset 1 to 4 weeks ago    Pain Frequency Intermittent    Aggravating Factors  stiffness in the morning    Pain Relieving Factors gentle motion                [facility] PT Assessment - 11/13/21 1144       Assessment   Medical Diagnosis Hemiparesis    Referring Provider (PT) Grier Mitts, MD    Onset Date/Surgical Date 05/06/21                           [facility] Adult PT Treatment/Exercise - 11/13/21 1144       Transfers   Sit to Stand 3: Mod assist   +2 support   Sit to Stand Details Tactile  cues for weight shifting;Visual cues/gestures for sequencing;Manual facilitation for weight bearing    Stand to Sit 3: Mod assist    Stand to Sit Details assist for controlled descent    Lateral/Scoot Transfers 3: Mod assist;4: Min assist    Lateral/Scoot Transfer Details (indicate cue type and reason) scotting to the R side w/c>mat, the patient needs mod A because he cannot pull himself over; scooting from at>w/c to the R he pulls from armrest and needs CGA to min A.      Ambulation/Gait   Ambulation/Gait No      Neuro Re-ed    Neuro Re-ed Details  Worked on facilitation of sit<>stand and initiation of anterior lean for transfers, scooting and standing.  Moved sit>stand to countertop x 2 reps with initially + 2 help (from PT and daughter).  Stood x 2 minutes before LEs fatigue.  Then went to hi/lo mat table and worked on transfers sit<>stand.  Worked with red physioball with R UE supported leaning anteriorly to roll ball and then attempted (with help) to  place L UE on ball and gentle roll ball for anterior WS. PT sat in front of patient and performed leaning anteriorly and then patient pulling back to midline for trunk engagement.      Exercises   Exercises Knee/Hip      Knee/Hip Exercises: Stretches   Passive Hamstring Stretch Right;Left;2 reps;30 seconds      Knee/Hip Exercises: Standing   Other Standing Knee Exercises L<>R weight shift with quad set on L x5 reps    Other Standing Knee Exercises Standing with emphasis on glut engagement      Knee/Hip Exercises: Seated   Heel Slides Strengthening;Left;10 reps    Heel Slides Limitations towel on floor to reduce friction                     PT Education - 11/13/21 1300     Education Details HEP initiated for L knee AROM seated, passive stretching for HS, and anterior weight shift with help from family.    Person(s) Educated Patient    Methods Explanation;Demonstration;Handout    Comprehension Verbalized understanding;Returned demonstration              PT Short Term Goals - 11/13/21 1300       PT SHORT TERM GOAL #1   Title The patient will be able to perform HEP with assist from family.    Baseline CONTINUE ALL STGS to new goal target date--- the patient has been out for 3 weeks due to Covid and all goals still appropriate.    Time 4    Period Weeks    Status Revised    Target Date 12/08/21      PT SHORT TERM GOAL #2   Title The patient will move sit<>stand with CGA to L PFRW.    Time 4    Period Weeks    Status Revised    Target Date 12/08/21      PT SHORT TERM GOAL #3   Title The patient will move sit<>supine with supervision in therapy (without bar he needs at home) to demo improving mobility.    Time 4    Period Weeks    Status Revised    Target Date 12/08/21      PT SHORT TERM GOAL #4   Title The patient will ambulate with L PFRW x 100 feet with min A.    Time 4    Period Weeks  Status Revised    Target Date 12/08/21      PT SHORT TERM  GOAL #5   Title The patient will be further assessed on stairs and goal to follow.    Time 4    Period Weeks    Status Revised    Target Date 12/08/21               PT Long Term Goals - 10/09/21 1407       PT LONG TERM GOAL #1   Title The patient will be able to perform HEP progression with min A from family.    Time 8    Period Weeks    Target Date 12/08/21      PT LONG TERM GOAL #2   Title The patient will move sit<>stand with supervision to RW and fasten L UE strap for PFRW use.    Time 8    Period Weeks    Target Date 12/08/21      PT LONG TERM GOAL #3   Title The patient will ambulate for household distances with L PFRW x 150 ft with CGA without loss of balance.    Time 8    Period Weeks    Target Date 12/08/21      PT LONG TERM GOAL #4   Title The patient will negotiate 4 steps with R sided handrail and CGA to be able to access his bathroom at home.    Time 8    Period Weeks    Target Date 12/08/21      PT LONG TERM GOAL #5   Title The patient will imrpove Berg balance score from 4/56 to > or equla to 16/56 to demo improving steady state balance for ADLs and mobility.    Time 8    Period Weeks    Target Date 12/08/21                   Plan - 11/13/21 1302     Clinical Impression Statement The patient returns to PT after 3 weeks due to Covid and is further deconditioned.  He tolerated standing and work on transfers today.  PT updated STG date considering he has not been seen recently.  We will plan to update STGs/ and LTGs at 12/08/21 date.  Plan to progress as tolerated encouraging increased mobility and initiating a HEP today.  Patient has very involved caregivers that help with carryover to home environment.    PT Treatment/Interventions ADLs/Self Care Home Management;Aquatic Therapy;Cryotherapy;Electrical Stimulation;Iontophoresis 4mg /ml Dexamethasone;Moist Heat;DME Instruction;Gait training;Stair training;Functional mobility training;Therapeutic  activities;Therapeutic exercise;Balance training;Neuromuscular re-education;Patient/family education;Orthotic Fit/Training;Wheelchair mobility training;Manual techniques;Dry needling;Taping    PT Next Visit Plan Continue HEP for L knee strengthening (quad engagement), sit<>stand with family assist, hamstring stretching, LE strength.  Work on sit to stand, standing weight shift emphasizing knee control (block L knee), gait training.    PT Home Exercise Plan Access Code [email or web address]    ZCHYIFOYD and Agree with Plan of Care Patient;Family member/caregiver    Family Member Consulted Wife and daughter             Patient will benefit from skilled therapeutic intervention in order to improve the following deficits and impairments:  Pain, Abnormal gait, Decreased range of motion, Difficulty walking, Impaired tone, Increased fascial restricitons, Decreased endurance, Impaired UE functional use, Decreased activity tolerance, Decreased balance, Impaired flexibility, Hypomobility, Decreased mobility, Decreased strength, Impaired sensation, Postural dysfunction  Visit Diagnosis: Hemiparesis, unspecified hemiparesis etiology, unspecified laterality (HCC)  Muscle  weakness (generalized)  Other abnormalities of gait and mobility  Unsteadiness on feet     Problem List Patient Active Problem List   Diagnosis Date Noted   Dizziness 12/10/2018   Pure hypercholesterolemia 04/21/2018   Hx of adenomatous colonic polyps 12/31/2017   Lightheadedness 03/20/2017   H/O: stroke 03/20/2017   Splenomegaly 11/01/2015   Chronic liver disease 11/01/2015   Hollenhorst plaque, right eye 05/11/2015   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 ([place]) 05/11/2015   Nuclear cataract of both eyes 10/04/2014   Primary open angle glaucoma of both eyes, severe stage 02/14/2014   Chest pain 11/02/2013   CAD in native artery 11/02/2013   PAF (paroxysmal atrial  fibrillation) ([place]) 03/16/2013   Erectile dysfunction 03/11/2011   SHINGLES 03/28/2010   URINARY FREQUENCY 11/07/2009   RUQ PAIN 01/24/2009   Diabetes mellitus with neuropathy ([place]) 02/24/2007   Hyperlipidemia 02/24/2007   PERIPHERAL NEUROPATHY 02/24/2007   Essential hypertension 02/24/2007   History of cardiovascular disorder 02/24/2007    Rudell Cobb, PT 11/13/2021, 1:08 PM  [facility] [place] [address] [place], [place], [address] Phone: [phone]   Fax:  [phone]  Name: Jimmy Carr MRN: [id number] Date of Birth: 1941-08-22

## 2021-11-13 NOTE — Patient Instructions (Signed)
Access Code: [email or web address] URL: [email or web address] Date: 11/13/2021 Prepared by: Rudell Cobb  Exercises Seated Heel Slide - 2 x daily - 7 x weekly - 1 sets - 10 reps Seated 3 Way Exercise Ball Roll Out Stretch - 2 x daily - 7 x weekly - 1 sets - 10 reps Seated Single Leg Hamstring Stretch with AFO - Wheelchair - 2 x daily - 7 x weekly - 1 sets - 2 reps - 30 stretch hold

## 2021-11-15 ENCOUNTER — Other Ambulatory Visit: Payer: Self-pay | Admitting: Family Medicine

## 2021-11-15 ENCOUNTER — Ambulatory Visit: Payer: Medicare Other | Admitting: Rehabilitative and Restorative Service Providers"

## 2021-11-15 ENCOUNTER — Ambulatory Visit: Payer: Medicare Other | Admitting: Occupational Therapy

## 2021-11-15 ENCOUNTER — Encounter: Payer: Self-pay | Admitting: Occupational Therapy

## 2021-11-15 ENCOUNTER — Other Ambulatory Visit: Payer: Self-pay

## 2021-11-15 DIAGNOSIS — M25632 Stiffness of left wrist, not elsewhere classified: Secondary | ICD-10-CM

## 2021-11-15 DIAGNOSIS — G8190 Hemiplegia, unspecified affecting unspecified side: Secondary | ICD-10-CM | POA: Diagnosis not present

## 2021-11-15 DIAGNOSIS — R600 Localized edema: Secondary | ICD-10-CM

## 2021-11-15 DIAGNOSIS — R2681 Unsteadiness on feet: Secondary | ICD-10-CM | POA: Diagnosis not present

## 2021-11-15 DIAGNOSIS — M6281 Muscle weakness (generalized): Secondary | ICD-10-CM

## 2021-11-15 DIAGNOSIS — R2689 Other abnormalities of gait and mobility: Secondary | ICD-10-CM | POA: Diagnosis not present

## 2021-11-15 DIAGNOSIS — R293 Abnormal posture: Secondary | ICD-10-CM

## 2021-11-15 DIAGNOSIS — I69354 Hemiplegia and hemiparesis following cerebral infarction affecting left non-dominant side: Secondary | ICD-10-CM | POA: Diagnosis not present

## 2021-11-15 NOTE — Therapy (Signed)
[facility] [address], [place], [place], [address] Phone: [phone]   Fax:  [phone]  Physical Therapy Treatment  Patient Details  Name: Jimmy Carr MRN: [id number] Date of Birth: [birth year] Referring Provider (PT): Grier Mitts, MD   Encounter Date: 11/15/2021   PT End of Session - 11/15/21 1403     Visit Number 5    Number of Visits 17    Date for PT Re-Evaluation 12/08/21    Authorization Type medicare    Authorization - Visit Number 5    Progress Note Due on Visit 10    PT Start Time 6644    PT Stop Time 0347    PT Time Calculation (min) 41 min    Equipment Utilized During Treatment Gait belt    Activity Tolerance Patient tolerated treatment well;Patient limited by fatigue    Behavior During Therapy [facility] for tasks assessed/performed             Past Medical History:  Diagnosis Date   AF (atrial fibrillation) ([place])    2D ECHO, 03/01/1987 - normal; NUCLEAR STRESS TEST, 04/10/2006 - EF 65%, no ischemia   Bruit    CAROTID DOPPLER, 09/20/2008 - normal, no evidence of diameter reduction, significant tortuousity or any other vascular abnormality   Cerebrovascular accident [facility]) 2003   history of small vessal lacunar stroke 2003   Diabetes mellitus type II    Glaucoma    Hyperlipidemia    Hypertension    Other specified cardiac dysrhythmias(427.89)    PSVT   Peripheral neuropathy     Past Surgical History:  Procedure Laterality Date   ANAL FISSURE REPAIR     CARDIAC CATHETERIZATION  02/02/2001   Normal LV systolic function, questionable mitral valve prolapse without mitral regurgitation, no evidence of CAD   CARDIAC CATHETERIZATION  09/13/2008   Minimal CAD, normal LV systolic function, no evidence of renal artery stenosis, distal aortic disease, or iliac disease    There were no vitals filed for this visit.   Subjective Assessment - 11/15/21 1802     Subjective The patient has been doing new HEP.     Pertinent History torn ligaments in L knee, diabetes, HTN, h/o shingles, h/o CVA, a-fib    Patient Stated Goals "be able to go up the steps to use the bathroom" *He lives in a split level and has 4 steps. He is using a bedside commode.    Currently in Pain? Yes    Pain Score --   not rated   Pain Location Knee    Pain Orientation Left    Pain Descriptors / Indicators Discomfort    Pain Type Acute pain    Pain Onset 1 to 4 weeks ago    Pain Frequency Intermittent    Aggravating Factors  movement (specific)    Pain Relieving Factors gentle motion                [facility] PT Assessment - 11/15/21 1405       Assessment   Medical Diagnosis Hemiparesis    Referring Provider (PT) Grier Mitts, MD    Onset Date/Surgical Date 05/06/21                           [facility] Adult PT Treatment/Exercise - 11/15/21 1405       Bed Mobility   Bed Mobility Rolling Right    Rolling Right Contact Guard/Touching assist  Transfers   Transfers Sit to Stand;Stand to Sit;Lateral/Scoot Transfers;Supine to Sit;Sit to Supine    Sit to Stand 3: Mod assist   reduced to min A from raised hi-lo mat table and repetition   Stand to Sit 4: Min assist;3: Mod assist    Stand to Sit Details was able to reach back with R UE today    Lateral/Scoot Transfers 4: Min assist;3: Mod assist    Lateral/Scoot Transfer Details (indicate cue type and reason) scooting to the R today was imrpoved over Tuesday with patient anteriorly weight shifting for boosting w/c>mat to the R side; then performed mat>w/c to the R side with min A to CGA.    Supine to Sit 3: Mod assist    Supine to Sit Details --   uses a bar at home and is used to pulling up   Sit to Supine 4: Min assist    Comments sit<>stand x 2 times in parallel bars and then 3 times to hemiwalker.  Performed standing weight shift and cues for glut engagement to bring trunk upright.      Ambulation/Gait   Ambulation/Gait Yes    Ambulation/Gait  Assistance 2: Max assist   for knee control   Ambulation/Gait Assistance Details Patient has initial shakiness in R knee upon rising.  After standing lateral weight shifts, he was able to advance the L LE in parallel bars.  PT assised with engaging L quad and encouraging weight shift to advance the R LE.  Worked in parallel bars with R UE assist and daughter nearby for support if needed.    Ambulation Distance (Feet) 5 Feet    Assistive device Parallel bars    Gait Pattern Step-to pattern;Step-through pattern;Decreased dorsiflexion - left;Poor foot clearance - left;Lateral hip instability;Decreased step length - right;Decreased stride length;Decreased weight shift to left      Therapeutic Activites    Therapeutic Activities Other Therapeutic Activities    Other Therapeutic Activities scooting supine in bed to prepare for rolling      Neuro Re-ed    Neuro Re-ed Details  Standing in parallel bars working on lateral weight shifting      Exercises   Exercises Knee/Hip      Knee/Hip Exercises: Stretches   Passive Hamstring Stretch Right;Left;1 rep;60 seconds    Quad Stretch Left;2 reps;30 seconds    Quad Stretch Limitations in R sidelying with PT stretching hip and quad    Hip Flexor Stretch Left;2 reps;30 seconds    Hip Flexor Stretch Limitations in R sidelying with PT performing      Knee/Hip Exercises: Supine   Bridges 10 reps    Bridges Limitations tactile cues for greater motion      Knee/Hip Exercises: Sidelying   Clams 10 reps with cues for trunk stability                       PT Short Term Goals - 11/13/21 1300       PT SHORT TERM GOAL #1   Title The patient will be able to perform HEP with assist from family.    Baseline CONTINUE ALL STGS to new goal target date--- the patient has been out for 3 weeks due to Covid and all goals still appropriate.    Time 4    Period Weeks    Status Revised    Target Date 12/08/21      PT SHORT TERM GOAL #2   Title The  patient will move  sit<>stand with CGA to L PFRW.    Time 4    Period Weeks    Status Revised    Target Date 12/08/21      PT SHORT TERM GOAL #3   Title The patient will move sit<>supine with supervision in therapy (without bar he needs at home) to demo improving mobility.    Time 4    Period Weeks    Status Revised    Target Date 12/08/21      PT SHORT TERM GOAL #4   Title The patient will ambulate with L PFRW x 100 feet with min A.    Time 4    Period Weeks    Status Revised    Target Date 12/08/21      PT SHORT TERM GOAL #5   Title The patient will be further assessed on stairs and goal to follow.    Time 4    Period Weeks    Status Revised    Target Date 12/08/21               PT Long Term Goals - 10/09/21 1407       PT LONG TERM GOAL #1   Title The patient will be able to perform HEP progression with min A from family.    Time 8    Period Weeks    Target Date 12/08/21      PT LONG TERM GOAL #2   Title The patient will move sit<>stand with supervision to RW and fasten L UE strap for PFRW use.    Time 8    Period Weeks    Target Date 12/08/21      PT LONG TERM GOAL #3   Title The patient will ambulate for household distances with L PFRW x 150 ft with CGA without loss of balance.    Time 8    Period Weeks    Target Date 12/08/21      PT LONG TERM GOAL #4   Title The patient will negotiate 4 steps with R sided handrail and CGA to be able to access his bathroom at home.    Time 8    Period Weeks    Target Date 12/08/21      PT LONG TERM GOAL #5   Title The patient will imrpove Berg balance score from 4/56 to > or equla to 16/56 to demo improving steady state balance for ADLs and mobility.    Time 8    Period Weeks    Target Date 12/08/21                   Plan - 11/15/21 1804     Clinical Impression Statement The patient tolerated taking steps in parallel bars today with L leg weakness and sensation it could buckle (PT guarded and  daughter +2 standby assist).  PT worked on functional transfers emphasizing reduced pulling on his family and external support to begin to work on normalizing movement for daily tasks.  Continue to progress to patient tolerance.    PT Treatment/Interventions ADLs/Self Care Home Management;Aquatic Therapy;Cryotherapy;Electrical Stimulation;Iontophoresis 4mg /ml Dexamethasone;Moist Heat;DME Instruction;Gait training;Stair training;Functional mobility training;Therapeutic activities;Therapeutic exercise;Balance training;Neuromuscular re-education;Patient/family education;Orthotic Fit/Training;Wheelchair mobility training;Manual techniques;Dry needling;Taping    PT Next Visit Plan Continue HEP for L knee strengthening (quad engagement), sit<>stand with family assist, hamstring stretching, LE strength.  Work on sit to stand, standing weight shift emphasizing knee control (block L knee), gait training.    PT Home Exercise Plan Access  Code [email or web address]    Consulted and Agree with Plan of Care Patient             Patient will benefit from skilled therapeutic intervention in order to improve the following deficits and impairments:  Pain, Abnormal gait, Decreased range of motion, Difficulty walking, Impaired tone, Increased fascial restricitons, Decreased endurance, Impaired UE functional use, Decreased activity tolerance, Decreased balance, Impaired flexibility, Hypomobility, Decreased mobility, Decreased strength, Impaired sensation, Postural dysfunction  Visit Diagnosis: Hemiparesis, unspecified hemiparesis etiology, unspecified laterality (HCC)  Muscle weakness (generalized)  Other abnormalities of gait and mobility  Unsteadiness on feet     Problem List Patient Active Problem List   Diagnosis Date Noted   Dizziness 12/10/2018   Pure hypercholesterolemia 04/21/2018   Hx of adenomatous colonic polyps 12/31/2017   Lightheadedness 03/20/2017   H/O: stroke 03/20/2017   Splenomegaly 11/01/2015    Chronic liver disease 11/01/2015   Hollenhorst plaque, right eye 05/11/2015   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 ([place]) 05/11/2015   Nuclear cataract of both eyes 10/04/2014   Primary open angle glaucoma of both eyes, severe stage 02/14/2014   Chest pain 11/02/2013   CAD in native artery 11/02/2013   PAF (paroxysmal atrial fibrillation) ([place]) 03/16/2013   Erectile dysfunction 03/11/2011   SHINGLES 03/28/2010   URINARY FREQUENCY 11/07/2009   RUQ PAIN 01/24/2009   Diabetes mellitus with neuropathy ([place]) 02/24/2007   Hyperlipidemia 02/24/2007   PERIPHERAL NEUROPATHY 02/24/2007   Essential hypertension 02/24/2007   History of cardiovascular disorder 02/24/2007    Zygmund Passero, PT 11/15/2021, 6:12 PM  [facility] [address] [place], [place], [address] Phone: [phone]   Fax:  [phone]  Name: GYASI HAZZARD MRN: [id number] Date of Birth: 1941-03-23

## 2021-11-15 NOTE — Therapy (Signed)
[facility] [address], [place], [place], [address] Phone: [phone]   Fax:  [phone]  Occupational Therapy Treatment  Patient Details  Name: Jimmy Carr MRN: [id number] Date of Birth: 1941-06-02 Referring Provider (OT): [name], MD   Encounter Date: 11/15/2021   OT End of Session - 11/15/21 1542     Visit Number 2    Number of Visits 17    Date for OT Re-Evaluation 12/14/21    Authorization Type Medicare A and B    Authorization - Visit Number 2    Authorization - Number of Visits 17    Progress Note Due on Visit 10    OT Start Time 1445    OT Stop Time 1525    OT Time Calculation (min) 40 min    Activity Tolerance Patient tolerated treatment well    Behavior During Therapy [facility] for tasks assessed/performed             Past Medical History:  Diagnosis Date   AF (atrial fibrillation) ([place])    2D ECHO, 03/01/1987 - normal; NUCLEAR STRESS TEST, 04/10/2006 - EF 65%, no ischemia   Bruit    CAROTID DOPPLER, 09/20/2008 - normal, no evidence of diameter reduction, significant tortuousity or any other vascular abnormality   Cerebrovascular accident [facility]) 2003   history of small vessal lacunar stroke 2003   Diabetes mellitus type II    Glaucoma    Hyperlipidemia    Hypertension    Other specified cardiac dysrhythmias(427.89)    PSVT   Peripheral neuropathy     Past Surgical History:  Procedure Laterality Date   ANAL FISSURE REPAIR     CARDIAC CATHETERIZATION  02/02/2001   Normal LV systolic function, questionable mitral valve prolapse without mitral regurgitation, no evidence of CAD   CARDIAC CATHETERIZATION  09/13/2008   Minimal CAD, normal LV systolic function, no evidence of renal artery stenosis, distal aortic disease, or iliac disease    There were no vitals filed for this visit.   Subjective Assessment - 11/15/21 1537     Subjective  Pt reports increased stiffness all over since missing  multiple therapy sessions due to sickness.    Patient is accompanied by: Family member   wife and daughter   Pertinent History torn ligaments in L knee, diabetes, HTN, h/o shingles, h/o CVA, a-fib, low back pain    Patient Stated Goals to be able to have functional grasp in L hand to hold things    Currently in Pain? Yes    Pain Score --   not rated   Pain Location Knee    Pain Orientation Left    Pain Descriptors / Indicators Discomfort;Sore    Pain Type Acute pain                          OT Treatments/Exercises (OP) - 11/15/21 0001       Neurological Re-education Exercises   Other Exercises 1 Engaged in weight shifting through trunk with LUE positioned on physio ball to faciliate weight shifting and incease shoulder flexion as increased tone in shoulder this session.  Encouraged weight shifting side to side to challenge weight shifting as well due to need for transfers and ADLs.      Splinting   Splinting Therapist remolded insert of off the shelf resting hand splint to allow for improved fit at wrist. Therapist modified strapping to allow for improved support  at wrist to faciliate increased wrist extension towards neutral.      Manual Therapy   Manual Therapy Passive ROM    Passive ROM Passive ROM with focus on wrist flexion/extension.  Pt continues to present in extreme wrist flexion requiring manual faciliation to extend towards neutral.  Pt reports increased pain in wrist at neutral and with increased stretch.  Pt unable to maintain positioning post stretch.                      OT Short Term Goals - 11/15/21 1542       OT SHORT TERM GOAL #1   Title Pt and caregiver will be independent in PROM/stretching HEP for increased L wrist mobility.    Time 4    Period Weeks    Status Achieved    Target Date 11/16/21      OT SHORT TERM GOAL #2   Title Pt and caregiver will be independent in positioning of LUE during seated and sleep for improved  positioning and control of edema.    Time 4    Period Weeks    Status On-going               OT Long Term Goals - 11/15/21 1549       OT LONG TERM GOAL #1   Title Pt and caregiver will be independent in splint care and wear    Time 8    Period Weeks    Status On-going    Target Date 12/14/21      OT LONG TERM GOAL #2   Title Pt will demonstrate improved L wrist extension to be able to grasp RW to increase ease with transfers.    Time 8    Period Weeks    Status On-going      OT LONG TERM GOAL #3   Title Pt will demonstrate improved L grasp to sustain grasp of 3# to maintain grasp on pill bottles to participate in administering medications.    Time 8    Period Weeks    Status On-going      OT LONG TERM GOAL #4   Title Pt will demonstrate increased shoulder flexion to reach mid range heights (100*) to utilize LUE as gross assist during simple meal prep tasks.    Time 8    Period Weeks    Status On-going      OT LONG TERM GOAL #5   Title Pt will maintain dynamic standing balance for 5 mins with min assist to engage in LB dressing and toileting hygiene to decrease burden of care.    Time 8    Period Weeks    Status On-going                   Plan - 11/15/21 1550     Clinical Impression Statement Pt is demonstrating increased stiffness in LE and even L shoulder this session.  Pt and family members receptive to education on positioning and AAROM for shoulder and LUE.  Pt tolerated stretching to L wrist with intermittent reports of pain at wrist.  Pt wife reports understanding of positioning and fit of splint to faciliate increased wrist extension.    OT Occupational Profile and History Detailed Assessment- Review of Records and additional review of physical, cognitive, psychosocial history related to current functional performance    Occupational performance deficits (Please refer to evaluation for details): ADL's;IADL's    Body Structure / Function /  Physical  Skills ADL;Balance;Body mechanics;Coordination;Decreased knowledge of precautions;Decreased knowledge of use of DME;Edema;Endurance;Flexibility;FMC;GMC;IADL;Mobility;Pain;Proprioception;ROM;Sensation;Skin integrity;Strength;Tone;UE functional use    Rehab Potential Fair    Clinical Decision Making Several treatment options, min-mod task modification necessary    Comorbidities Affecting Occupational Performance: May have comorbidities impacting occupational performance    Modification or Assistance to Complete Evaluation  Min-Moderate modification of tasks or assist with assess necessary to complete eval    OT Frequency 2x / week    OT Duration 8 weeks    OT Treatment/Interventions Self-care/ADL training;Biofeedback;Cryotherapy;Electrical Stimulation;Moist Heat;Ultrasound;Iontophoresis;Paraffin;Fluidtherapy;Therapeutic exercise;Neuromuscular education;Energy conservation;DME and/or AE instruction;Functional Mobility Training;Manual Therapy;Passive range of motion;Splinting;Therapeutic activities;Patient/family education;Balance training    Plan continue wrist PROM/stretching and progress to WB as able.  Continue to assess current splinting options    Consulted and Agree with Plan of Care Patient;Family member/caregiver    Family Member Consulted wife and daughter             Patient will benefit from skilled therapeutic intervention in order to improve the following deficits and impairments:   Body Structure / Function / Physical Skills: ADL, Balance, Body mechanics, Coordination, Decreased knowledge of precautions, Decreased knowledge of use of DME, Edema, Endurance, Flexibility, FMC, GMC, IADL, Mobility, Pain, Proprioception, ROM, Sensation, Skin integrity, Strength, Tone, UE functional use       Visit Diagnosis: Muscle weakness (generalized)  Other abnormalities of gait and mobility  Hemiplegia and hemiparesis following cerebral infarction affecting left non-dominant side  (HCC)  Stiffness of left wrist, not elsewhere classified  Localized edema  Abnormal posture    Problem List Patient Active Problem List   Diagnosis Date Noted   Dizziness 12/10/2018   Pure hypercholesterolemia 04/21/2018   Hx of adenomatous colonic polyps 12/31/2017   Lightheadedness 03/20/2017   H/O: stroke 03/20/2017   Splenomegaly 11/01/2015   Chronic liver disease 11/01/2015   Hollenhorst plaque, right eye 05/11/2015   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 ([place]) 05/11/2015   Nuclear cataract of both eyes 10/04/2014   Primary open angle glaucoma of both eyes, severe stage 02/14/2014   Chest pain 11/02/2013   CAD in native artery 11/02/2013   PAF (paroxysmal atrial fibrillation) ([place]) 03/16/2013   Erectile dysfunction 03/11/2011   SHINGLES 03/28/2010   URINARY FREQUENCY 11/07/2009   RUQ PAIN 01/24/2009   Diabetes mellitus with neuropathy ([place]) 02/24/2007   Hyperlipidemia 02/24/2007   PERIPHERAL NEUROPATHY 02/24/2007   Essential hypertension 02/24/2007   History of cardiovascular disorder 02/24/2007    Simonne Come, OT 11/15/2021, 4:05 PM  [facility] [address] [place], [place], [address] Phone: [phone]   Fax:  [phone]  Name: Jimmy Carr MRN: [id number] Date of Birth: 03-19-1941

## 2021-11-17 ENCOUNTER — Other Ambulatory Visit: Payer: Self-pay | Admitting: Family Medicine

## 2021-11-20 ENCOUNTER — Ambulatory Visit: Payer: Medicare Other | Admitting: Occupational Therapy

## 2021-11-20 ENCOUNTER — Ambulatory Visit: Payer: Medicare Other

## 2021-11-22 ENCOUNTER — Ambulatory Visit: Payer: Medicare Other | Admitting: Occupational Therapy

## 2021-11-22 ENCOUNTER — Ambulatory Visit: Payer: Medicare Other

## 2021-11-26 ENCOUNTER — Telehealth: Payer: Self-pay | Admitting: Family Medicine

## 2021-11-26 NOTE — Telephone Encounter (Signed)
Patient called in requesting a phone call back regarding a possible referral for the lower back pain that he's been experiencing.  Please advise.

## 2021-11-26 NOTE — Telephone Encounter (Signed)
Called pt, no answer, left message to call office, needs visit to assess before placing referral.

## 2021-11-27 ENCOUNTER — Encounter: Payer: Self-pay | Admitting: Occupational Therapy

## 2021-11-27 ENCOUNTER — Ambulatory Visit: Payer: Medicare Other

## 2021-11-27 ENCOUNTER — Other Ambulatory Visit: Payer: Self-pay

## 2021-11-27 ENCOUNTER — Ambulatory Visit: Payer: Medicare Other | Admitting: Occupational Therapy

## 2021-11-27 DIAGNOSIS — M6281 Muscle weakness (generalized): Secondary | ICD-10-CM | POA: Diagnosis not present

## 2021-11-27 DIAGNOSIS — R2681 Unsteadiness on feet: Secondary | ICD-10-CM

## 2021-11-27 DIAGNOSIS — I69354 Hemiplegia and hemiparesis following cerebral infarction affecting left non-dominant side: Secondary | ICD-10-CM | POA: Diagnosis not present

## 2021-11-27 DIAGNOSIS — R2689 Other abnormalities of gait and mobility: Secondary | ICD-10-CM

## 2021-11-27 DIAGNOSIS — G8190 Hemiplegia, unspecified affecting unspecified side: Secondary | ICD-10-CM

## 2021-11-27 DIAGNOSIS — M25632 Stiffness of left wrist, not elsewhere classified: Secondary | ICD-10-CM

## 2021-11-27 NOTE — Therapy (Signed)
[facility] [place], [place], [place], [address] Phone: [phone]   Fax:  [phone]  Physical Therapy Treatment  Patient Details  Name: Jimmy Carr MRN: [id number] Date of Birth: 10-13-1940 Referring Provider (PT): Grier Mitts, MD   Encounter Date: 11/27/2021   PT End of Session - 11/27/21 1537     Visit Number 6    Number of Visits 17    Date for PT Re-Evaluation 12/08/21    Authorization Type medicare    Authorization - Visit Number 6    Progress Note Due on Visit 10    PT Start Time 8938    PT Stop Time 1017    PT Time Calculation (min) 40 min    Equipment Utilized During Treatment Gait belt    Activity Tolerance Patient tolerated treatment well;Patient limited by fatigue    Behavior During Therapy [facility] for tasks assessed/performed             Past Medical History:  Diagnosis Date   AF (atrial fibrillation) ([place])    2D ECHO, 03/01/1987 - normal; NUCLEAR STRESS TEST, 04/10/2006 - EF 65%, no ischemia   Bruit    CAROTID DOPPLER, 09/20/2008 - normal, no evidence of diameter reduction, significant tortuousity or any other vascular abnormality   Cerebrovascular accident [facility]) 2003   history of small vessal lacunar stroke 2003   Diabetes mellitus type II    Glaucoma    Hyperlipidemia    Hypertension    Other specified cardiac dysrhythmias(427.89)    PSVT   Peripheral neuropathy     Past Surgical History:  Procedure Laterality Date   ANAL FISSURE REPAIR     CARDIAC CATHETERIZATION  02/02/2001   Normal LV systolic function, questionable mitral valve prolapse without mitral regurgitation, no evidence of CAD   CARDIAC CATHETERIZATION  09/13/2008   Minimal CAD, normal LV systolic function, no evidence of renal artery stenosis, distal aortic disease, or iliac disease    There were no vitals filed for this visit.   Subjective Assessment - 11/27/21 1623     Subjective Low back and left knee have been  bothering limiting standing activities at home    Pertinent History torn ligaments in L knee, diabetes, HTN, h/o shingles, h/o CVA, a-fib    Patient Stated Goals "be able to go up the steps to use the bathroom" *He lives in a split level and has 4 steps. He is using a bedside commode.    Currently in Pain? Yes    Pain Score 5     Pain Location Back    Pain Orientation Lower    Pain Type Acute pain;Chronic pain    Pain Onset 1 to 4 weeks ago                [facility] PT Assessment - 11/27/21 0001       Assessment   Medical Diagnosis Hemiparesis    Referring Provider (PT) Grier Mitts, MD                           [facility] Adult PT Treatment/Exercise - 11/27/21 0001       Transfers   Transfers Sit to Stand;Stand to Sit;Lateral/Scoot Transfers;Supine to Sit;Sit to Supine    Sit to Stand 3: Mod assist    Sit to Stand Details Tactile cues for weight shifting;Visual cues/gestures for sequencing;Manual facilitation for weight bearing    Sit to Stand Details (  indicate cue type and reason) from w/c level with draw sheet to encourage hip extension/flexion    Comments performed x 5 trials in parallel bars maintaining static standing with PT facilitating left lateral weight shifting blocking left knee for extension and pt pushing LUE into therapist to induce RLE offloading and pt performing RLE advancement/lifting to increase LLE weight acceptance and improve maneuvering in small spaces. Standing performing lumbar extensions against draw sheet 4x15 reps to improve upright posture                     PT Education - 11/27/21 1624     Education Details education/demonstration of use of towel or foam roll for lumbar support to encourage upright posture and decrease back pain    Person(s) Educated Patient    Methods Explanation;Demonstration    Comprehension Verbalized understanding              PT Short Term Goals - 11/13/21 1300       PT SHORT TERM GOAL #1    Title The patient will be able to perform HEP with assist from family.    Baseline CONTINUE ALL STGS to new goal target date--- the patient has been out for 3 weeks due to Covid and all goals still appropriate.    Time 4    Period Weeks    Status Revised    Target Date 12/08/21      PT SHORT TERM GOAL #2   Title The patient will move sit<>stand with CGA to L PFRW.    Time 4    Period Weeks    Status Revised    Target Date 12/08/21      PT SHORT TERM GOAL #3   Title The patient will move sit<>supine with supervision in therapy (without bar he needs at home) to demo improving mobility.    Time 4    Period Weeks    Status Revised    Target Date 12/08/21      PT SHORT TERM GOAL #4   Title The patient will ambulate with L PFRW x 100 feet with min A.    Time 4    Period Weeks    Status Revised    Target Date 12/08/21      PT SHORT TERM GOAL #5   Title The patient will be further assessed on stairs and goal to follow.    Time 4    Period Weeks    Status Revised    Target Date 12/08/21               PT Long Term Goals - 10/09/21 1407       PT LONG TERM GOAL #1   Title The patient will be able to perform HEP progression with min A from family.    Time 8    Period Weeks    Target Date 12/08/21      PT LONG TERM GOAL #2   Title The patient will move sit<>stand with supervision to RW and fasten L UE strap for PFRW use.    Time 8    Period Weeks    Target Date 12/08/21      PT LONG TERM GOAL #3   Title The patient will ambulate for household distances with L PFRW x 150 ft with CGA without loss of balance.    Time 8    Period Weeks    Target Date 12/08/21      PT LONG TERM  GOAL #4   Title The patient will negotiate 4 steps with R sided handrail and CGA to be able to access his bathroom at home.    Time 8    Period Weeks    Target Date 12/08/21      PT LONG TERM GOAL #5   Title The patient will imrpove Berg balance score from 4/56 to > or equla to 16/56 to  demo improving steady state balance for ADLs and mobility.    Time 8    Period Weeks    Target Date 12/08/21                   Plan - 11/27/21 1626     Clinical Impression Statement Tolerated session well with ability to progress standing activities to 60-90 sec performing dynamic activities such as RLE offloading/limb advancement and lumbar extensions for midline orientation and extension for ROM/posture. Pt and family feel progress has been somewhat limited due to interaction of medical/health conditions that has resulted in absence from sessions and limited activity performance at home. Encouraged increased consistency of physical activity at home and discussed need for continued sessions in development of adaptation/compensation and restorative activities    Personal Factors and Comorbidities Time since onset of injury/illness/exacerbation;Comorbidity 3+    Comorbidities diabetes, HTN, a-fib, low back pain    Examination-Activity Limitations Bed Mobility;Bathing;Locomotion Level;Transfers;Reach Overhead;Bend;Dressing;Hygiene/Grooming;Lift;Stand;Toileting;Stairs;Squat;Sit    Examination-Participation Restrictions Church;Cleaning;Community Activity;Driving;Interpersonal Relationship;Medication Management;Yard Work    PT Treatment/Interventions ADLs/Self Care Home Management;Aquatic Therapy;Cryotherapy;Electrical Stimulation;Iontophoresis 4mg /ml Dexamethasone;Moist Heat;DME Instruction;Gait training;Stair training;Functional mobility training;Therapeutic activities;Therapeutic exercise;Balance training;Neuromuscular re-education;Patient/family education;Orthotic Fit/Training;Wheelchair mobility training;Manual techniques;Dry needling;Taping    PT Next Visit Plan Continue HEP for L knee strengthening (quad engagement), sit<>stand with family assist, hamstring stretching, LE strength.  Work on sit to stand, standing weight shift emphasizing knee control (block L knee), gait training. Demo  [place] e-stim for tetanic contraction in left tib anterior    PT Home Exercise Plan Access Code [email or web address]    Consulted and Agree with Plan of Care Patient    Family Member Consulted Wife and daughter             Patient will benefit from skilled therapeutic intervention in order to improve the following deficits and impairments:  Pain, Abnormal gait, Decreased range of motion, Difficulty walking, Impaired tone, Increased fascial restricitons, Decreased endurance, Impaired UE functional use, Decreased activity tolerance, Decreased balance, Impaired flexibility, Hypomobility, Decreased mobility, Decreased strength, Impaired sensation, Postural dysfunction  Visit Diagnosis: Muscle weakness (generalized)  Other abnormalities of gait and mobility  Unsteadiness on feet  Hemiparesis, unspecified hemiparesis etiology, unspecified laterality ([place])     Problem List Patient Active Problem List   Diagnosis Date Noted   Dizziness 12/10/2018   Pure hypercholesterolemia 04/21/2018   Hx of adenomatous colonic polyps 12/31/2017   Lightheadedness 03/20/2017   H/O: stroke 03/20/2017   Splenomegaly 11/01/2015   Chronic liver disease 11/01/2015   Hollenhorst plaque, right eye 05/11/2015   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 ([place]) 05/11/2015   Nuclear cataract of both eyes 10/04/2014   Primary open angle glaucoma of both eyes, severe stage 02/14/2014   Chest pain 11/02/2013   CAD in native artery 11/02/2013   PAF (paroxysmal atrial fibrillation) ([place]) 03/16/2013   Erectile dysfunction 03/11/2011   SHINGLES 03/28/2010   URINARY FREQUENCY 11/07/2009   RUQ PAIN 01/24/2009   Diabetes mellitus with neuropathy ([place]) 02/24/2007   Hyperlipidemia 02/24/2007   PERIPHERAL NEUROPATHY 02/24/2007  Essential hypertension 02/24/2007   History of cardiovascular disorder 02/24/2007    Toniann Fail, PT 11/27/2021, 4:29 PM  [facility] [address], [place], [place], [address] Phone: [phone]   Fax:  [phone]  Name: MARCELLAS MARCHANT MRN: [id number] Date of Birth: [birth year]

## 2021-11-27 NOTE — Therapy (Signed)
[facility] [place], [place], [place], [address] Phone: [phone]   Fax:  [phone]  Occupational Therapy Treatment  Patient Details  Name: Jimmy Carr MRN: [id number] Date of Birth: 01/28/1941 Referring Provider (OT): [name], MD   Encounter Date: 11/27/2021   OT End of Session - 11/27/21 1459     Visit Number 3    Number of Visits 17    Date for OT Re-Evaluation 12/14/21    Authorization Type Medicare A and B    Authorization - Visit Number 3    Authorization - Number of Visits 17    Progress Note Due on Visit 10    OT Start Time 1446    OT Stop Time 1531    OT Time Calculation (min) 45 min    Activity Tolerance Patient tolerated treatment well    Behavior During Therapy [facility] for tasks assessed/performed             Past Medical History:  Diagnosis Date   AF (atrial fibrillation) ([place])    2D ECHO, 03/01/1987 - normal; NUCLEAR STRESS TEST, 04/10/2006 - EF 65%, no ischemia   Bruit    CAROTID DOPPLER, 09/20/2008 - normal, no evidence of diameter reduction, significant tortuousity or any other vascular abnormality   Cerebrovascular accident [facility]) 2003   history of small vessal lacunar stroke 2003   Diabetes mellitus type II    Glaucoma    Hyperlipidemia    Hypertension    Other specified cardiac dysrhythmias(427.89)    PSVT   Peripheral neuropathy     Past Surgical History:  Procedure Laterality Date   ANAL FISSURE REPAIR     CARDIAC CATHETERIZATION  02/02/2001   Normal LV systolic function, questionable mitral valve prolapse without mitral regurgitation, no evidence of CAD   CARDIAC CATHETERIZATION  09/13/2008   Minimal CAD, normal LV systolic function, no evidence of renal artery stenosis, distal aortic disease, or iliac disease    There were no vitals filed for this visit.   Subjective Assessment - 11/27/21 1457     Subjective  Pt reports back gave out and has an appt with PCP for  referral to address back pain.    Patient is accompanied by: Family member   wife and daughter   Pertinent History torn ligaments in L knee, diabetes, HTN, h/o shingles, h/o CVA, a-fib, low back pain    Patient Stated Goals to be able to have functional grasp in L hand to hold things    Currently in Pain? Yes    Pain Score 5     Pain Location Back    Pain Orientation Lower    Pain Onset More than a month ago    Pain Frequency Constant             Wrist stretch with wife demonstrating typical stretching technique and routine.  Therapist reiterated hold during stretch, recommending hold 5-10 seconds (as tolerated) to increase stress and tolerance to positioning.  Therapist completed manual stretch to wrist, able to achieve nearly neutral wrist position with stretching but pt unable to maintain position without assistance.  Utilized UE Ranger in sitting with focus on improved shoulder and elbow mobility while attempting to sustain wrist position at nearly neutral.  Pt demonstrating increased extension with repetitive movement.   Therapist remolded prefabricated splint to facilitate improved fit and secured straps to provide support at wrist to facilitate further wrist flexion.  Educated on shoulder and  scapular mobility in sitting position with focus on hand placement of wife to assist with improved stretch.  Pt reports tightness in pecs with stretch.  Educated on positioning and stretch for pec and scapular mobility.  Provided handout and added towel glides to handout.  Reiterated towel glides in sitting at table with focus on shoulder and elbow ROM and wrist flexion as able.      Access Code: [email or web address] URL: [email or web address] Date: 11/27/2021 Prepared by: [facility]  Exercises Seated Scapular Retraction with External Rotation - 1 x daily - 7 x weekly - 1 sets - 10 reps Open Book Chest Stretch on Towel Roll - 1 x daily - 7 x weekly -  1 sets - 10 reps                 OT Short Term Goals - 11/15/21 1542       OT SHORT TERM GOAL #1   Title Pt and caregiver will be independent in PROM/stretching HEP for increased L wrist mobility.    Time 4    Period Weeks    Status Achieved    Target Date 11/16/21      OT SHORT TERM GOAL #2   Title Pt and caregiver will be independent in positioning of LUE during seated and sleep for improved positioning and control of edema.    Time 4    Period Weeks    Status On-going               OT Long Term Goals - 11/15/21 1549       OT LONG TERM GOAL #1   Title Pt and caregiver will be independent in splint care and wear    Time 8    Period Weeks    Status On-going    Target Date 12/14/21      OT LONG TERM GOAL #2   Title Pt will demonstrate improved L wrist extension to be able to grasp RW to increase ease with transfers.    Time 8    Period Weeks    Status On-going      OT LONG TERM GOAL #3   Title Pt will demonstrate improved L grasp to sustain grasp of 3# to maintain grasp on pill bottles to participate in administering medications.    Time 8    Period Weeks    Status On-going      OT LONG TERM GOAL #4   Title Pt will demonstrate increased shoulder flexion to reach mid range heights (100*) to utilize LUE as gross assist during simple meal prep tasks.    Time 8    Period Weeks    Status On-going      OT LONG TERM GOAL #5   Title Pt will maintain dynamic standing balance for 5 mins with min assist to engage in LB dressing and toileting hygiene to decrease burden of care.    Time 8    Period Weeks    Status On-going                   Plan - 11/27/21 1459     Clinical Impression Statement Pt is demonstrating increased stiffness in L shoulder and wrist this session.  Pt and family members receptive to education on positioning, provided handout, and AAROM for shoulder to facilitate scapular mobility and pec stretch.  Pt tolerated stretching  to L wrist with intermittent reports of pain at wrist and increase  in tone with stretch.    OT Occupational Profile and History Detailed Assessment- Review of Records and additional review of physical, cognitive, psychosocial history related to current functional performance    Occupational performance deficits (Please refer to evaluation for details): ADL's;IADL's    Body Structure / Function / Physical Skills ADL;Balance;Body mechanics;Coordination;Decreased knowledge of precautions;Decreased knowledge of use of DME;Edema;Endurance;Flexibility;FMC;GMC;IADL;Mobility;Pain;Proprioception;ROM;Sensation;Skin integrity;Strength;Tone;UE functional use    Rehab Potential Fair    Clinical Decision Making Several treatment options, min-mod task modification necessary    Comorbidities Affecting Occupational Performance: May have comorbidities impacting occupational performance    Modification or Assistance to Complete Evaluation  Min-Moderate modification of tasks or assist with assess necessary to complete eval    OT Frequency 2x / week    OT Duration 8 weeks    OT Treatment/Interventions Self-care/ADL training;Biofeedback;Cryotherapy;Electrical Stimulation;Moist Heat;Ultrasound;Iontophoresis;Paraffin;Fluidtherapy;Therapeutic exercise;Neuromuscular education;Energy conservation;DME and/or AE instruction;Functional Mobility Training;Manual Therapy;Passive range of motion;Splinting;Therapeutic activities;Patient/family education;Balance training    Plan continue wrist PROM/stretching and progress to WB as able.  Continue to assess current splinting options    Consulted and Agree with Plan of Care Patient;Family member/caregiver    Family Member Consulted wife and daughter             Patient will benefit from skilled therapeutic intervention in order to improve the following deficits and impairments:   Body Structure / Function / Physical Skills: ADL, Balance, Body mechanics, Coordination, Decreased  knowledge of precautions, Decreased knowledge of use of DME, Edema, Endurance, Flexibility, FMC, GMC, IADL, Mobility, Pain, Proprioception, ROM, Sensation, Skin integrity, Strength, Tone, UE functional use       Visit Diagnosis: Muscle weakness (generalized)  Stiffness of left wrist, not elsewhere classified  Hemiplegia and hemiparesis following cerebral infarction affecting left non-dominant side (HCC)    Problem List Patient Active Problem List   Diagnosis Date Noted   Dizziness 12/10/2018   Pure hypercholesterolemia 04/21/2018   Hx of adenomatous colonic polyps 12/31/2017   Lightheadedness 03/20/2017   H/O: stroke 03/20/2017   Splenomegaly 11/01/2015   Chronic liver disease 11/01/2015   Hollenhorst plaque, right eye 05/11/2015   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 ([place]) 05/11/2015   Nuclear cataract of both eyes 10/04/2014   Primary open angle glaucoma of both eyes, severe stage 02/14/2014   Chest pain 11/02/2013   CAD in native artery 11/02/2013   PAF (paroxysmal atrial fibrillation) ([place]) 03/16/2013   Erectile dysfunction 03/11/2011   SHINGLES 03/28/2010   URINARY FREQUENCY 11/07/2009   RUQ PAIN 01/24/2009   Diabetes mellitus with neuropathy ([place]) 02/24/2007   Hyperlipidemia 02/24/2007   PERIPHERAL NEUROPATHY 02/24/2007   Essential hypertension 02/24/2007   History of cardiovascular disorder 02/24/2007    Simonne Come, OT 11/27/2021, 3:48 PM  [facility] [address], [place], [place], [address] Phone: [phone]   Fax:  [phone]  Name: Jimmy Carr MRN: [id number] Date of Birth: 06-10-1941

## 2021-11-29 ENCOUNTER — Ambulatory Visit: Payer: Medicare Other | Admitting: Occupational Therapy

## 2021-11-29 ENCOUNTER — Ambulatory Visit: Payer: Medicare Other

## 2021-11-30 ENCOUNTER — Ambulatory Visit (INDEPENDENT_AMBULATORY_CARE_PROVIDER_SITE_OTHER): Payer: Medicare Other | Admitting: Family Medicine

## 2021-11-30 ENCOUNTER — Encounter: Payer: Self-pay | Admitting: Family Medicine

## 2021-11-30 VITALS — BP 155/98 | HR 82 | Temp 98.1°F

## 2021-11-30 DIAGNOSIS — L304 Erythema intertrigo: Secondary | ICD-10-CM

## 2021-11-30 DIAGNOSIS — M25469 Effusion, unspecified knee: Secondary | ICD-10-CM

## 2021-11-30 DIAGNOSIS — I10 Essential (primary) hypertension: Secondary | ICD-10-CM

## 2021-11-30 DIAGNOSIS — Z7409 Other reduced mobility: Secondary | ICD-10-CM

## 2021-11-30 DIAGNOSIS — M25562 Pain in left knee: Secondary | ICD-10-CM | POA: Diagnosis not present

## 2021-11-30 DIAGNOSIS — Z8673 Personal history of transient ischemic attack (TIA), and cerebral infarction without residual deficits: Secondary | ICD-10-CM

## 2021-11-30 DIAGNOSIS — I890 Lymphedema, not elsewhere classified: Secondary | ICD-10-CM

## 2021-11-30 DIAGNOSIS — M545 Low back pain, unspecified: Secondary | ICD-10-CM

## 2021-11-30 DIAGNOSIS — G8929 Other chronic pain: Secondary | ICD-10-CM | POA: Diagnosis not present

## 2021-11-30 MED ORDER — NYSTATIN 100000 UNIT/GM EX POWD
1.0000 | Freq: Three times a day (TID) | CUTANEOUS | 0 refills | Status: DC
Start: 2021-11-30 — End: 2022-02-18

## 2021-11-30 NOTE — Progress Notes (Signed)
Subjective:  ? ? Patient ID: Jimmy Carr, male    DOB: [birth year], 81 y.o.   MRN: [id number] ? ?Chief Complaint  ?Patient presents with  ? Back Pain  ?  Started 1 week ago, was really bad on left side yday is a sharp pain. Hurts more when has too move, is whole lower back, has had xray from ortho and they saw nothing. Is frustrated bc he has not idea what it is.   ? Knee Pain  ?  Left knee pain for 3 weeks. Swollen and tight, normally goes away, but has not. Can't straighten it or stand on it   ?Pt accompanied by his wife and daughter. ? ?HPI ?Patient is a 81 year old male with PMH SIG for history of CVA, A-fib, HLD, HTN, peripheral neuropathy, CAD, DM 2 who was seen today for ongoing concerns.  Pt states his low back started hurting 1 wk ago.  Initially pain was sharp and on both sides of lower back, but yesterday was more L sided pain with certain movements that has eased off.  Back pain okay with sitting.  Pt does not recall injury. Pt sedentary at home 2/2 decreased mobility since COVID infection 10/21/21.  At times patient is unable to stand 2/2 increased left knee edema, pain, and occasional instability of right knee.  This makes it difficult for patient to make it to PT.  Patient states in the past he was seen by Ortho and was told he had torn ligaments.  Patient's wife does not recall this.  Patient currently requiring 2 person lift assistance.  Patient's wife is unable to help patient stand 2/2 recent injury.  Patient's wife states when both of patient's legs are hurting he is a 3 person assist. ? ?Patient's wife states patient has a pruritic rash on bottom.  OTC powders and hemorrhoid cream were not helpful. ? ?Past Medical History:  ?Diagnosis Date  ? AF (atrial fibrillation) ([place])   ? 2D ECHO, 03/01/1987 - normal; NUCLEAR STRESS TEST, 04/10/2006 - EF 65%, no ischemia  ? Bruit   ? CAROTID DOPPLER, 09/20/2008 - normal, no evidence of diameter reduction, significant tortuousity or any other vascular  abnormality  ? Cerebrovascular accident [facility]) 2003  ? history of small vessal lacunar stroke 2003  ? Diabetes mellitus type II   ? Glaucoma   ? Hyperlipidemia   ? Hypertension   ? Other specified cardiac dysrhythmias(427.89)   ? PSVT  ? Peripheral neuropathy   ? ? ?Allergies  ?Allergen Reactions  ? Cheratussin Ac [Guaifenesin-Codeine] Nausea And Vomiting  ? Verapamil   ? ? ?ROS ?General: Denies fever, chills, night sweats, changes in weight, changes in appetite ?HEENT: Denies headaches, ear pain, changes in vision, rhinorrhea, sore throat ?CV: Denies CP, palpitations, SOB, orthopnea ?Pulm: Denies SOB, cough, wheezing ?GI: Denies abdominal pain, nausea, vomiting, diarrhea, constipation ?GU: Denies dysuria, hematuria, frequency, vaginal discharge ?Msk: Denies muscle cramps, joint pains + left knee pain, edema, LLE edema ?Neuro: Denies weakness, numbness, tingling + left hemiparesis ?Skin: Denies rashes, bruising ?Psych: Denies depression, anxiety, hallucinations ?   ?Objective:  ?  ?Blood pressure (!) 155/98, pulse 82, temperature 98.1 ?F (36.7 ?C), temperature source Oral, SpO2 98 %. ? ?[place], well-nourished, in no distress, normal affect   ?HEENT: [place]/AT, face symmetric, conjunctiva clear, no scleral icterus, PERRLA, EOMI, nares patent without drainage ?Lungs: no accessory muscle use, CTAB, no wheezes or rales ?Cardiovascular: RRR, no m/r/g, no peripheral edema ?Musculoskeletal: Left-sided hemiparesis and edema at baseline.  Deformity and edema of left hand.  Unable to fully extend left leg at knee.  Range of motion in left leg at knee extremely limited at 90 degrees ?15 extension and flexion.  No effusion of left knee.  No cyanosis or clubbing, normal tone ?Neuro:  A&Ox3, CN II-XII intact, gait not assessed as sitting in transport wheelchair with gait belt in place. ?Skin:  Warm, no lesions/ rash.  LLE edema> right LE, with dimpling/thickening of skin from lymphedema.  Gluteal cleft with satellite lesions  and intertrigo like rash viewed from photo on phone. ? ? ?Wt Readings from Last 3 Encounters:  ?10/22/21 240 lb (108.9 kg)  ?08/08/20 249 lb (112.9 kg)  ?08/04/20 246 lb (111.6 kg)  ? ? ?Lab Results  ?Component Value Date  ? WBC 8.8 08/04/2020  ? HGB 16.6 08/04/2020  ? HCT 51.0 08/04/2020  ? PLT 166 08/04/2020  ? GLUCOSE 140 (H) 08/04/2020  ? CHOL 136 03/02/2020  ? TRIG 147.0 03/02/2020  ? HDL 36.00 (L) 03/02/2020  ? LDLDIRECT 67.0 05/16/2015  ? [place] 70 03/02/2020  ? ALT 21 07/14/2020  ? AST 17 07/14/2020  ? NA 139 08/04/2020  ? K 3.9 08/04/2020  ? CL 102 08/04/2020  ? CREATININE 1.36 (H) 08/04/2020  ? BUN 20 08/04/2020  ? CO2 25 08/04/2020  ? TSH 1.86 12/31/2017  ? PSA 1.92 03/02/2020  ? HGBA1C 7.9 (H) 03/02/2020  ? MICROALBUR 2.0 (H) 12/31/2017  ? ? ?Assessment/Plan: ? ? On day of service, 41 minutes spent caring for this patient face-to-face, reviewing the chart, counseling and/or coordinating care for plan and treatment of diagnosis below.   ? ? ?Chronic pain of left knee  ?-Discussed topical analgesics such as Voltaren gel 3 times daily as needed ?-Heat/ice ?-Patient encouraged to continue exercises taught in PT ?- Plan: Ambulatory referral to Orthopedic Surgery ? ?Edema of knee  ?-Edema at baseline due to history of CVA.  Increased edema likely 2/2 sedentary lifestyle.  Also consider gout flare, arthritis, tendinitis. ?- Plan: Ambulatory referral to Orthopedic Surgery ? ?Lymphedema ?-Discussed supportive care however patient unable to wear TED hose/compression socks as they are too tight to get on.  Unable to elevate LLE 2/2 decreased ROM. ?-Discussed wrapping bilateral LEs to reduce edema.  LLE wrapped with Ace bandage in clinic. ?-Referral placed to lymphedema clinic. ? - Plan: AMB referral to rehabilitation ? ?Chronic bilateral low back pain without sciatica  ?-Advised low back pain likely 2/2 sedentary lifestyle. ?-Patient advised to work on standing at bedside with assistance a few times per  day. ?-Continue PT and stretching exercises. ?- Plan: Ambulatory referral to Orthopedic Surgery ? ?Intertrigo  ?-2/2 sedentary lifestyle ?-Consider moisture wicking fabrics ?- Plan: nystatin (MYCOSTATIN/NYSTOP) powder ? ?History of CVA (cerebrovascular accident) ?-Continue statin ?-Discussed the importance of BP control ?-Continue PT when able.  If unable to continue outpatient PT can place referral for [facility]. ?-Handicap hybrid form completed and returned to patient ? ?Impaired mobility ?-Progress made in PT setback by COVID-19 infection at the end of January 2023 ?-Discussed making safety modifications around the home ?-Continue stretching exercises learned in PT ?-Return to PT when able ?-Consider lift chair for home as patient is currently a two-person assist.  ?-Also discussed considering return to [facility] PT. ?-Given patient's current mobility impairments may need to consider ALF/SNF placement. ? ? ?Essential hypertension ?-Elevated ?-Discussed importance of lifestyle modifications ?-Continue current medications ?-For continued elevation will need medication adjustments. ? ?F/u in 1 month, sooner if needed. ? ?  Grier Mitts, MD ?

## 2021-12-01 ENCOUNTER — Other Ambulatory Visit: Payer: Self-pay | Admitting: Family Medicine

## 2021-12-01 DIAGNOSIS — E0840 Diabetes mellitus due to underlying condition with diabetic neuropathy, unspecified: Secondary | ICD-10-CM

## 2021-12-04 ENCOUNTER — Ambulatory Visit: Payer: Medicare Other | Attending: Family Medicine

## 2021-12-04 ENCOUNTER — Telehealth: Payer: Self-pay | Admitting: Occupational Therapy

## 2021-12-04 ENCOUNTER — Encounter: Payer: Self-pay | Admitting: Occupational Therapy

## 2021-12-04 ENCOUNTER — Ambulatory Visit: Payer: Medicare Other | Admitting: Occupational Therapy

## 2021-12-04 ENCOUNTER — Other Ambulatory Visit: Payer: Self-pay

## 2021-12-04 DIAGNOSIS — M25632 Stiffness of left wrist, not elsewhere classified: Secondary | ICD-10-CM | POA: Diagnosis not present

## 2021-12-04 DIAGNOSIS — R2681 Unsteadiness on feet: Secondary | ICD-10-CM | POA: Insufficient documentation

## 2021-12-04 DIAGNOSIS — R293 Abnormal posture: Secondary | ICD-10-CM | POA: Insufficient documentation

## 2021-12-04 DIAGNOSIS — G8190 Hemiplegia, unspecified affecting unspecified side: Secondary | ICD-10-CM | POA: Diagnosis not present

## 2021-12-04 DIAGNOSIS — R2689 Other abnormalities of gait and mobility: Secondary | ICD-10-CM | POA: Insufficient documentation

## 2021-12-04 DIAGNOSIS — M6281 Muscle weakness (generalized): Secondary | ICD-10-CM | POA: Insufficient documentation

## 2021-12-04 NOTE — Therapy (Signed)
[facility] [address], [place], [place], [address] Phone: [phone]   Fax:  [phone]  Physical Therapy Treatment  Patient Details  Name: Jimmy Carr MRN: [id number] Date of Birth: [birth year] Referring Provider (PT): Grier Mitts, MD   Encounter Date: 12/04/2021   PT End of Session - 12/04/21 1442     Visit Number 7    Number of Visits 17    Date for PT Re-Evaluation 12/08/21    Authorization Type medicare    Authorization - Visit Number 7    Progress Note Due on Visit 10    PT Start Time 0630    PT Stop Time 1601    PT Time Calculation (min) 42 min    Equipment Utilized During Treatment Gait belt    Activity Tolerance Patient tolerated treatment well;Patient limited by fatigue    Behavior During Therapy [facility] for tasks assessed/performed             Past Medical History:  Diagnosis Date   AF (atrial fibrillation) ([place])    2D ECHO, 03/01/1987 - normal; NUCLEAR STRESS TEST, 04/10/2006 - EF 65%, no ischemia   Bruit    CAROTID DOPPLER, 09/20/2008 - normal, no evidence of diameter reduction, significant tortuousity or any other vascular abnormality   Cerebrovascular accident [facility]) 2003   history of small vessal lacunar stroke 2003   Diabetes mellitus type II    Glaucoma    Hyperlipidemia    Hypertension    Other specified cardiac dysrhythmias(427.89)    PSVT   Peripheral neuropathy     Past Surgical History:  Procedure Laterality Date   ANAL FISSURE REPAIR     CARDIAC CATHETERIZATION  02/02/2001   Normal LV systolic function, questionable mitral valve prolapse without mitral regurgitation, no evidence of CAD   CARDIAC CATHETERIZATION  09/13/2008   Minimal CAD, normal LV systolic function, no evidence of renal artery stenosis, distal aortic disease, or iliac disease    There were no vitals filed for this visit.   Subjective Assessment - 12/04/21 1449     Subjective Back is still sore, but not too bad.  Reports trials of standing up to 5 minutes at home using stair banister for UE support    Pertinent History torn ligaments in L knee, diabetes, HTN, h/o shingles, h/o CVA, a-fib    Patient Stated Goals "be able to go up the steps to use the bathroom" *He lives in a split level and has 4 steps. He is using a bedside commode.    Currently in Pain? Yes    Pain Score 4     Pain Location Back    Pain Orientation Lower    Pain Descriptors / Indicators Sore    Pain Type Chronic pain    Pain Onset 1 to 4 weeks ago                               [facility] Adult PT Treatment/Exercise - 12/04/21 0001       Bed Mobility   Bed Mobility Rolling Left    Rolling Right Contact Guard/Touching assist    Rolling Left Minimal Assistance - Patient > 75%    Supine to Sit Minimal Assistance - Patient > 75%    Sit to Supine Minimal Assistance - Patient > 75%      Transfers   Lateral/Scoot Transfers 4: Min assist    Research officer, trade [employer]  Details (indicate cue type and reason) lateral scoot w/c<>EOM      Neuro Re-ed    Neuro Re-ed Details  in supine performing LLE alternating hip/knee flexion-extension against manual resistance and emphasis on rapid, alternating direction to improve limb coordination for gait activities 4x10. Techniques to facilitate LLE full extension with co-contraction of glute and quad with tapping and positioning, demonstrted to caregiver for position. Training in supine trunk stabilization for abdominal isometric to decrease pain when performing transition movements, e.g. sit to supine requiring verbal/tactile cues for engagement      Knee/Hip Exercises: Sidelying   Clams 3x10 with therapist blocking at lumbar to prevent compensation                       PT Short Term Goals - 11/13/21 1300       PT SHORT TERM GOAL #1   Title The patient will be able to perform HEP with assist from family.    Baseline CONTINUE ALL STGS to new goal target date--- the  patient has been out for 3 weeks due to Covid and all goals still appropriate.    Time 4    Period Weeks    Status Revised    Target Date 12/08/21      PT SHORT TERM GOAL #2   Title The patient will move sit<>stand with CGA to L PFRW.    Time 4    Period Weeks    Status Revised    Target Date 12/08/21      PT SHORT TERM GOAL #3   Title The patient will move sit<>supine with supervision in therapy (without bar he needs at home) to demo improving mobility.    Time 4    Period Weeks    Status Revised    Target Date 12/08/21      PT SHORT TERM GOAL #4   Title The patient will ambulate with L PFRW x 100 feet with min A.    Time 4    Period Weeks    Status Revised    Target Date 12/08/21      PT SHORT TERM GOAL #5   Title The patient will be further assessed on stairs and goal to follow.    Time 4    Period Weeks    Status Revised    Target Date 12/08/21               PT Long Term Goals - 10/09/21 1407       PT LONG TERM GOAL #1   Title The patient will be able to perform HEP progression with min A from family.    Time 8    Period Weeks    Target Date 12/08/21      PT LONG TERM GOAL #2   Title The patient will move sit<>stand with supervision to RW and fasten L UE strap for PFRW use.    Time 8    Period Weeks    Target Date 12/08/21      PT LONG TERM GOAL #3   Title The patient will ambulate for household distances with L PFRW x 150 ft with CGA without loss of balance.    Time 8    Period Weeks    Target Date 12/08/21      PT LONG TERM GOAL #4   Title The patient will negotiate 4 steps with R sided handrail and CGA to be able to access his bathroom at home.  Time 8    Period Weeks    Target Date 12/08/21      PT LONG TERM GOAL #5   Title The patient will imrpove Berg balance score from 4/56 to > or equla to 16/56 to demo improving steady state balance for ADLs and mobility.    Time 8    Period Weeks    Target Date 12/08/21                    Plan - 12/04/21 1631     Clinical Impression Statement Tx focus on mobility and muscular activation techniques such as co-contraction LLE glute/quad to improve extended limb strength/posturing for weight acceptance requiring assist for limb position and tactile cues for input. Hip flexor tightness left > right present limiting neutral to extension of left hip. Responded well to techniques to facilitate abdominal bracing/stabilization during bed mobility and transition movements with report of decreased back pain during trials. Continued sessions indicated to progress functional mobility and facilitate increased independence to reduce level of assistance from caregivers.    Personal Factors and Comorbidities Time since onset of injury/illness/exacerbation;Comorbidity 3+    Comorbidities diabetes, HTN, a-fib, low back pain    Examination-Activity Limitations Bed Mobility;Bathing;Locomotion Level;Transfers;Reach Overhead;Bend;Dressing;Hygiene/Grooming;Lift;Stand;Toileting;Stairs;Squat;Sit    Examination-Participation Restrictions Church;Cleaning;Community Activity;Driving;Interpersonal Relationship;Medication Management;Yard Work    PT Treatment/Interventions ADLs/Self Care Home Management;Aquatic Therapy;Cryotherapy;Electrical Stimulation;Iontophoresis '4mg'$ /ml Dexamethasone;Moist Heat;DME Instruction;Gait training;Stair training;Functional mobility training;Therapeutic activities;Therapeutic exercise;Balance training;Neuromuscular re-education;Patient/family education;Orthotic Fit/Training;Wheelchair mobility training;Manual techniques;Dry needling;Taping    PT Next Visit Plan Demo [place] e-stim for tetanic contraction in left tib anterior    PT Home Exercise Plan Access Code [email or web address]    Consulted and Agree with Plan of Care Patient    Family Member Consulted Wife and daughter             Patient will benefit from skilled therapeutic intervention in order to improve the following  deficits and impairments:  Pain, Abnormal gait, Decreased range of motion, Difficulty walking, Impaired tone, Increased fascial restricitons, Decreased endurance, Impaired UE functional use, Decreased activity tolerance, Decreased balance, Impaired flexibility, Hypomobility, Decreased mobility, Decreased strength, Impaired sensation, Postural dysfunction  Visit Diagnosis: Muscle weakness (generalized)  Other abnormalities of gait and mobility  Abnormal posture  Unsteadiness on feet  Hemiparesis, unspecified hemiparesis etiology, unspecified laterality ([place])     Problem List Patient Active Problem List   Diagnosis Date Noted   Dizziness 12/10/2018   Pure hypercholesterolemia 04/21/2018   Hx of adenomatous colonic polyps 12/31/2017   Lightheadedness 03/20/2017   H/O: stroke 03/20/2017   Splenomegaly 11/01/2015   Chronic liver disease 11/01/2015   Hollenhorst plaque, right eye 05/11/2015   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 ([place]) 05/11/2015   Nuclear cataract of both eyes 10/04/2014   Primary open angle glaucoma of both eyes, severe stage 02/14/2014   Chest pain 11/02/2013   CAD in native artery 11/02/2013   PAF (paroxysmal atrial fibrillation) ([place]) 03/16/2013   Erectile dysfunction 03/11/2011   SHINGLES 03/28/2010   URINARY FREQUENCY 11/07/2009   RUQ PAIN 01/24/2009   Diabetes mellitus with neuropathy ([place]) 02/24/2007   Hyperlipidemia 02/24/2007   PERIPHERAL NEUROPATHY 02/24/2007   Essential hypertension 02/24/2007   History of cardiovascular disorder 02/24/2007    Toniann Fail, PT 12/04/2021, 4:35 PM  [facility] [address], [place], [place], [address] Phone: [phone]   Fax:  [phone]  Name: Jimmy Carr MRN: [id number] Date of  Birth: 01-Jul-1941

## 2021-12-04 NOTE — Therapy (Signed)
[facility] [place], [place], [place], [address] Phone: [phone]   Fax:  [phone]  Occupational Therapy Treatment  Patient Details  Name: Jimmy Carr MRN: [id number] Date of Birth: 28-Dec-1940 Referring Provider (OT): [name], MD   Encounter Date: 12/04/2021   OT End of Session - 12/04/21 1459     Visit Number 4    Number of Visits 17    Date for OT Re-Evaluation 12/14/21    Authorization Type Medicare A and B    Authorization - Visit Number 4    Authorization - Number of Visits 17    Progress Note Due on Visit 10    OT Start Time 1404    OT Stop Time 9509    OT Time Calculation (min) 45 min    Activity Tolerance Patient tolerated treatment well    Behavior During Therapy [facility] for tasks assessed/performed             Past Medical History:  Diagnosis Date   AF (atrial fibrillation) ([place])    2D ECHO, 03/01/1987 - normal; NUCLEAR STRESS TEST, 04/10/2006 - EF 65%, no ischemia   Bruit    CAROTID DOPPLER, 09/20/2008 - normal, no evidence of diameter reduction, significant tortuousity or any other vascular abnormality   Cerebrovascular accident [facility]) 2003   history of small vessal lacunar stroke 2003   Diabetes mellitus type II    Glaucoma    Hyperlipidemia    Hypertension    Other specified cardiac dysrhythmias(427.89)    PSVT   Peripheral neuropathy     Past Surgical History:  Procedure Laterality Date   ANAL FISSURE REPAIR     CARDIAC CATHETERIZATION  02/02/2001   Normal LV systolic function, questionable mitral valve prolapse without mitral regurgitation, no evidence of CAD   CARDIAC CATHETERIZATION  09/13/2008   Minimal CAD, normal LV systolic function, no evidence of renal artery stenosis, distal aortic disease, or iliac disease    There were no vitals filed for this visit.   Subjective Assessment - 12/04/21 1410     Subjective  Pt reports being able to bring hand to chest without assist  from other hand    Patient is accompanied by: Family member   wife and daughter   Pertinent History torn ligaments in L knee, diabetes, HTN, h/o shingles, h/o CVA, a-fib, low back pain    Patient Stated Goals to be able to have functional grasp in L hand to hold things    Pain Onset More than a month ago             Engaged in PROM to L wrist to allow for increased wrist extension to engage in WB and sit > stand.  Therapist providing distraction and manual techniques to nearly achieve neutral.  Pt tolerated open hand position on elevated mat table, still requiring manual facilitation for wrist extension to neutral.  Pt able to demonstrate increased finger movements today, however requiring extreme wrist flexion to accomplish.  Pt demonstrate pinch strength to maintain hold on item in L hand in key pinch.  Therapist facilitated WB through LUE on elevated mat table maintaining wrist in near neutral while pt engaged in modified sit > stand to increase WB through LUE.  Pt continues to require max assist and cues for anterior weight shift for sit > partial stand.    Engaged in sit > stand x3 with therapist positioned in front of pt to facilitate increased  anterior weight shifting and liftoff of buttocks.  Pt completed sit > stand with mod assist with improved weight shifting with facilitation at hips and buttocks.    Engaged in discussion about recommendation for PM&R services to address spasticity in LUE.  Pt reports initial follow up with neurologist but not wishing to continue follow up as he didn't feel they were able to aid him in any way.  Pt also reports fearfulness of having to change or get rid of some muscle relaxers to address tone in LUE.  Encouraged pt to pursue additional options and resources as the tone is not going away with therapy alone.  Pt reporting understanding, but still has concerns.                       OT Short Term Goals - 11/15/21 1542       OT SHORT  TERM GOAL #1   Title Pt and caregiver will be independent in PROM/stretching HEP for increased L wrist mobility.    Time 4    Period Weeks    Status Achieved    Target Date 11/16/21      OT SHORT TERM GOAL #2   Title Pt and caregiver will be independent in positioning of LUE during seated and sleep for improved positioning and control of edema.    Time 4    Period Weeks    Status On-going               OT Long Term Goals - 11/15/21 1549       OT LONG TERM GOAL #1   Title Pt and caregiver will be independent in splint care and wear    Time 8    Period Weeks    Status On-going    Target Date 12/14/21      OT LONG TERM GOAL #2   Title Pt will demonstrate improved L wrist extension to be able to grasp RW to increase ease with transfers.    Time 8    Period Weeks    Status On-going      OT LONG TERM GOAL #3   Title Pt will demonstrate improved L grasp to sustain grasp of 3# to maintain grasp on pill bottles to participate in administering medications.    Time 8    Period Weeks    Status On-going      OT LONG TERM GOAL #4   Title Pt will demonstrate increased shoulder flexion to reach mid range heights (100*) to utilize LUE as gross assist during simple meal prep tasks.    Time 8    Period Weeks    Status On-going      OT LONG TERM GOAL #5   Title Pt will maintain dynamic standing balance for 5 mins with min assist to engage in LB dressing and toileting hygiene to decrease burden of care.    Time 8    Period Weeks    Status On-going                   Plan - 12/04/21 1500     Clinical Impression Statement Pt continues to demonstrate tone in L wrist impacting ability to advance to neutral and maintain in neutral.  Pt tolerated WB through L hand and forearm with attempts at sit > stand to allow increased WB through L hand to attempt to break tone.  Pt continues to be limited by spasticity to progress towards any functional  use of LUE even to progress towards  use of RW without platform.    OT Occupational Profile and History Detailed Assessment- Review of Records and additional review of physical, cognitive, psychosocial history related to current functional performance    Occupational performance deficits (Please refer to evaluation for details): ADL's;IADL's    Body Structure / Function / Physical Skills ADL;Balance;Body mechanics;Coordination;Decreased knowledge of precautions;Decreased knowledge of use of DME;Edema;Endurance;Flexibility;FMC;GMC;IADL;Mobility;Pain;Proprioception;ROM;Sensation;Skin integrity;Strength;Tone;UE functional use    Rehab Potential Fair    Clinical Decision Making Several treatment options, min-mod task modification necessary    Comorbidities Affecting Occupational Performance: May have comorbidities impacting occupational performance    Modification or Assistance to Complete Evaluation  Min-Moderate modification of tasks or assist with assess necessary to complete eval    OT Frequency 2x / week    OT Duration 8 weeks    OT Treatment/Interventions Self-care/ADL training;Biofeedback;Cryotherapy;Electrical Stimulation;Moist Heat;Ultrasound;Iontophoresis;Paraffin;Fluidtherapy;Therapeutic exercise;Neuromuscular education;Energy conservation;DME and/or AE instruction;Functional Mobility Training;Manual Therapy;Passive range of motion;Splinting;Therapeutic activities;Patient/family education;Balance training    Plan continue wrist PROM/stretching and progress to WB as able.  Attempt WB in forearm, sidelying on L side, open hand over bowl/pebble to release tone.  Continue to assess current splinting options    Consulted and Agree with Plan of Care Patient;Family member/caregiver    Family Member Consulted wife and daughter             Patient will benefit from skilled therapeutic intervention in order to improve the following deficits and impairments:   Body Structure / Function / Physical Skills: ADL, Balance, Body mechanics,  Coordination, Decreased knowledge of precautions, Decreased knowledge of use of DME, Edema, Endurance, Flexibility, FMC, GMC, IADL, Mobility, Pain, Proprioception, ROM, Sensation, Skin integrity, Strength, Tone, UE functional use       Visit Diagnosis: Muscle weakness (generalized)  Other abnormalities of gait and mobility  Abnormal posture  Unsteadiness on feet  Stiffness of left wrist, not elsewhere classified    Problem List Patient Active Problem List   Diagnosis Date Noted   Dizziness 12/10/2018   Pure hypercholesterolemia 04/21/2018   Hx of adenomatous colonic polyps 12/31/2017   Lightheadedness 03/20/2017   H/O: stroke 03/20/2017   Splenomegaly 11/01/2015   Chronic liver disease 11/01/2015   Hollenhorst plaque, right eye 05/11/2015   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 ([place]) 05/11/2015   Nuclear cataract of both eyes 10/04/2014   Primary open angle glaucoma of both eyes, severe stage 02/14/2014   Chest pain 11/02/2013   CAD in native artery 11/02/2013   PAF (paroxysmal atrial fibrillation) ([place]) 03/16/2013   Erectile dysfunction 03/11/2011   SHINGLES 03/28/2010   URINARY FREQUENCY 11/07/2009   RUQ PAIN 01/24/2009   Diabetes mellitus with neuropathy ([place]) 02/24/2007   Hyperlipidemia 02/24/2007   PERIPHERAL NEUROPATHY 02/24/2007   Essential hypertension 02/24/2007   History of cardiovascular disorder 02/24/2007    Simonne Come, OT 12/04/2021, 3:03 PM  [facility] [address], [place], [place], [address] Phone: [phone]   Fax:  [phone]  Name: Jimmy Carr MRN: [id number] Date of Birth: [birth year]

## 2021-12-04 NOTE — Telephone Encounter (Signed)
Dr. Volanda Napoleon, ?Kurt Hoffmeier was evaluated by Occupational Therapy back in January due to LUE impairments s/p CVA.  Pt continues to demonstrate increased spasticity in LUE, especially wrist impacting functional use and ability to utilize RW. ? ?The patient would benefit from PM&R referral for evaluation to address his spasticity by either medications or possibly botox if appropriate.   ? ?If you agree, please place an order in Epic. ?Thank you, ?Simonne Come, OTR/L ? ?[facility] ?[address] ?[place], [place]  [address] ?Phone:  [phone] ?Fax:  [phone]  ?

## 2021-12-05 ENCOUNTER — Other Ambulatory Visit: Payer: Self-pay | Admitting: Family Medicine

## 2021-12-05 ENCOUNTER — Encounter: Payer: Self-pay | Admitting: Surgery

## 2021-12-05 ENCOUNTER — Ambulatory Visit (INDEPENDENT_AMBULATORY_CARE_PROVIDER_SITE_OTHER): Payer: Medicare Other | Admitting: Surgery

## 2021-12-05 ENCOUNTER — Ambulatory Visit: Payer: Self-pay

## 2021-12-05 VITALS — BP 148/91 | HR 113

## 2021-12-05 DIAGNOSIS — M25562 Pain in left knee: Secondary | ICD-10-CM | POA: Diagnosis not present

## 2021-12-05 DIAGNOSIS — M545 Low back pain, unspecified: Secondary | ICD-10-CM | POA: Diagnosis not present

## 2021-12-05 DIAGNOSIS — Z8673 Personal history of transient ischemic attack (TIA), and cerebral infarction without residual deficits: Secondary | ICD-10-CM

## 2021-12-05 DIAGNOSIS — R262 Difficulty in walking, not elsewhere classified: Secondary | ICD-10-CM

## 2021-12-05 DIAGNOSIS — I69398 Other sequelae of cerebral infarction: Secondary | ICD-10-CM

## 2021-12-05 DIAGNOSIS — I69354 Hemiplegia and hemiparesis following cerebral infarction affecting left non-dominant side: Secondary | ICD-10-CM

## 2021-12-05 DIAGNOSIS — G8929 Other chronic pain: Secondary | ICD-10-CM | POA: Diagnosis not present

## 2021-12-05 DIAGNOSIS — R252 Cramp and spasm: Secondary | ICD-10-CM

## 2021-12-05 NOTE — Telephone Encounter (Signed)
Agree with PM&R.  Referral placed. ?

## 2021-12-05 NOTE — Progress Notes (Incomplete)
Office Visit Note   Patient: Jimmy Carr           Date of Birth: March 11, 1941           MRN: [id number] Visit Date: 12/05/2021              Requested by: [name], MD [place],  [place] [address] PCP: [name], MD   Assessment & Plan: Visit Diagnoses:  1. Chronic bilateral low back pain, unspecified whether sciatica present   2. Chronic pain of left knee     Plan: ***  Follow-Up Instructions: No follow-ups on file.   Orders:  Orders Placed This Encounter  Procedures   XR Lumbar Spine 2-3 Views   XR Knee 1-2 Views Left   No orders of the defined types were placed in this encounter.     Procedures: No procedures performed   Clinical Data: No additional findings.   Subjective: Chief Complaint  Patient presents with   Left Knee - Pain   Lower Back - Pain    HPI  Review of Systems   Objective: Vital Signs: BP (!) 148/91    Pulse (!) 113   Physical Exam  Ortho Exam  Specialty Comments:  No specialty comments available.  Imaging: No results found.   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 ([place]) 05/11/2015   Nuclear cataract of both eyes 10/04/2014   Primary open angle glaucoma of both eyes, severe stage 02/14/2014   Chest pain 11/02/2013   CAD in native artery 11/02/2013   PAF (paroxysmal atrial fibrillation) ([place]) 03/16/2013   Erectile dysfunction 03/11/2011   SHINGLES 03/28/2010   URINARY FREQUENCY 11/07/2009   RUQ PAIN 01/24/2009   Diabetes mellitus with neuropathy ([place]) 02/24/2007    Hyperlipidemia 02/24/2007   PERIPHERAL NEUROPATHY 02/24/2007   Essential hypertension 02/24/2007   History of cardiovascular disorder 02/24/2007   Past Medical History:  Diagnosis Date   AF (atrial fibrillation) ([place])    2D ECHO, 03/01/1987 - normal; NUCLEAR STRESS TEST, 04/10/2006 - EF 65%, no ischemia   Bruit    CAROTID DOPPLER, 09/20/2008 - normal, no evidence of diameter reduction, significant tortuousity or any other vascular abnormality   Cerebrovascular accident [facility]) 2003   history of small vessal lacunar stroke 2003   Diabetes mellitus type II    Glaucoma    Hyperlipidemia    Hypertension    Other specified cardiac dysrhythmias(427.89)    PSVT   Peripheral neuropathy     Family History  Problem Relation Age of Onset   Diabetes Neg Hx    Colon cancer Neg Hx     Past Surgical History:  Procedure Laterality Date   ANAL FISSURE REPAIR     CARDIAC CATHETERIZATION  02/02/2001   Normal LV systolic function, questionable mitral valve prolapse without mitral regurgitation, no evidence of CAD   CARDIAC CATHETERIZATION  09/13/2008   Minimal CAD, normal LV systolic function, no evidence of renal artery stenosis, distal aortic disease, or iliac disease   Social History   Occupational History   Not on file  Tobacco Use   Smoking status:  Never   Smokeless tobacco: Never  Vaping Use   Vaping Use: Never used  Substance and Sexual Activity   Alcohol use: No   Drug use: No   Sexual activity: Not on file

## 2021-12-07 ENCOUNTER — Ambulatory Visit: Payer: Medicare Other | Admitting: Occupational Therapy

## 2021-12-07 ENCOUNTER — Ambulatory Visit: Payer: Medicare Other

## 2021-12-11 ENCOUNTER — Ambulatory Visit: Payer: Medicare Other

## 2021-12-11 ENCOUNTER — Ambulatory Visit: Payer: Medicare Other | Admitting: Occupational Therapy

## 2021-12-14 ENCOUNTER — Ambulatory Visit: Payer: Medicare Other

## 2021-12-14 ENCOUNTER — Encounter: Payer: Medicare Other | Admitting: Occupational Therapy

## 2021-12-17 ENCOUNTER — Encounter: Payer: Self-pay | Admitting: Gastroenterology

## 2021-12-19 ENCOUNTER — Other Ambulatory Visit: Payer: Self-pay | Admitting: Surgery

## 2021-12-19 DIAGNOSIS — M5127 Other intervertebral disc displacement, lumbosacral region: Secondary | ICD-10-CM

## 2021-12-19 DIAGNOSIS — M4807 Spinal stenosis, lumbosacral region: Secondary | ICD-10-CM

## 2021-12-20 ENCOUNTER — Other Ambulatory Visit: Payer: Self-pay | Admitting: Family Medicine

## 2021-12-20 DIAGNOSIS — I69354 Hemiplegia and hemiparesis following cerebral infarction affecting left non-dominant side: Secondary | ICD-10-CM

## 2021-12-24 DIAGNOSIS — I480 Paroxysmal atrial fibrillation: Secondary | ICD-10-CM | POA: Diagnosis not present

## 2021-12-24 DIAGNOSIS — Z8673 Personal history of transient ischemic attack (TIA), and cerebral infarction without residual deficits: Secondary | ICD-10-CM | POA: Diagnosis not present

## 2021-12-24 DIAGNOSIS — G629 Polyneuropathy, unspecified: Secondary | ICD-10-CM | POA: Diagnosis not present

## 2021-12-24 DIAGNOSIS — I6930 Unspecified sequelae of cerebral infarction: Secondary | ICD-10-CM | POA: Diagnosis not present

## 2021-12-24 DIAGNOSIS — I69354 Hemiplegia and hemiparesis following cerebral infarction affecting left non-dominant side: Secondary | ICD-10-CM | POA: Diagnosis not present

## 2021-12-24 DIAGNOSIS — E785 Hyperlipidemia, unspecified: Secondary | ICD-10-CM | POA: Diagnosis not present

## 2021-12-25 ENCOUNTER — Encounter: Payer: Self-pay | Admitting: Occupational Therapy

## 2021-12-25 ENCOUNTER — Encounter: Payer: Self-pay | Admitting: Physical Medicine & Rehabilitation

## 2021-12-25 ENCOUNTER — Other Ambulatory Visit: Payer: Self-pay | Admitting: Family Medicine

## 2021-12-25 DIAGNOSIS — I69354 Hemiplegia and hemiparesis following cerebral infarction affecting left non-dominant side: Secondary | ICD-10-CM

## 2021-12-25 DIAGNOSIS — E1140 Type 2 diabetes mellitus with diabetic neuropathy, unspecified: Secondary | ICD-10-CM

## 2021-12-25 NOTE — Therapy (Unsigned)
[facility] ?[address], [address] ?[place], [place], [address] ?Phone: [phone]   Fax:  [phone] ? ?Patient Details  ?Name: Jimmy Carr ?MRN: [id number] ?Date of Birth: [birth year] ?Referring Provider:  No ref. provider found ? ?Encounter Date: 12/25/2021 ? ?OCCUPATIONAL THERAPY DISCHARGE SUMMARY ? ?Visits from Start of Care: 4 ? ?Current functional level related to goals / functional outcomes: ?Pt has been limited in his attendance of therapy due to illness and limitations in mobility with back and BLE pain.  Pt understands importance of consistency with therapy sessions, however has been overwhelmed by pain and appts to identify root of pain and treat pain.  Pt has been offered transitioning of care to another clinic to allow for closer proximity to him home to allow for him to attend more appointments, however pt reports desire to resume at this clinic once he gets everything else under control.  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 ?  ?Remaining deficits: ?LUE spasticity, decreased ROM in LUE, decreased transitional movements with sit > stand and transfers - requiring 1-2 person assist ?  ?Education / Equipment: ?Pt has been educated on need for medical management for his tone and to follow up with neurologist and referral for PM&R to address LUE in particular.  Pt has been educated on PROM and AAROM stretches and WB through LUE.  ? ?Patient agrees to discharge. Patient goals were not met. Patient is being discharged due to not returning since the last visit and plan to address other medical issues to allow for more frequent attendance in therapy. ? ? ? ? Simonne Come, [place] ?12/25/2021, 12:42 PM ? ?[facility] ?[address], [address] ?[place], [place], [address] ?Phone: [phone]   Fax:  [phone] ?

## 2021-12-25 NOTE — Telephone Encounter (Signed)
Last OV on 11/30/21: Return in about 1 month (around 12/31/2021), or if symptoms worsen or fail to improve. ? ?Pt notified of above & f/u appt scheduled.  ?Pt also states he continues to take Metformin.  ?

## 2021-12-26 DIAGNOSIS — M47816 Spondylosis without myelopathy or radiculopathy, lumbar region: Secondary | ICD-10-CM | POA: Diagnosis not present

## 2021-12-28 ENCOUNTER — Ambulatory Visit: Payer: Medicare Other | Admitting: Physical Medicine & Rehabilitation

## 2021-12-29 NOTE — Progress Notes (Signed)
?Cardiology Office Note:   ? ?Date:  01/04/2022  ? ?ID:  Jimmy Carr, DOB 1941/04/10, MRN [id number] ? ?PCP:  [name], MD  ?Cardiologist:  Pixie Casino, MD  ?Electrophysiologist:  None  ? ?Referring MD: [name], MD  ? ?Chief Complaint: follow-up of atrial fibrillation ? ?History of Present Illness:   ? ?Jimmy Carr is a 81 y.o. male with a history of minimal CAD noted on remote cardiac catheterization in 2009, paroxysmal atrial fibrillation on Xarelto, paroxysmal SVT, hypertension, hyperlipidemia, type 2 diabetes mellitus, and stroke with residual left sided hemiparesis who is followed by Dr. Debara Pickett and presents today for routine follow-up. ? ?Patient has a long history of atrial fibrillation. He was previously on Coumadin and Digoxin but both were previously stopped several years ago due to no recurrent atrial fibrillation. However, he had a TIA in 2020 and telemetry reportedly showed concerns for atrial fibrillation. It was recommended that anticoagulation be restarted but patient was very resistant to this. Patient was last seen by Dr. Debara Pickett in 07/2020 following recent ED visit at [facility] for recurrent atrial fibrillation. Dr. Debara Pickett again had a long discussion with patient about the need for anticoagulation given stroke risk and he was agreeable to starting a DOAC. Aspirin was stopped and he was ultimately started on Eliquis. However, patient stopped this on his own after 5 doses and restarted Aspirin due to GI side effects. He was then admitted in 04/2021 at [facility] with an acute ischemic stroke after presenting with left sided numbness and weakness. Echo at that time showed LVEF of 5-60% with negative bubble study. He was started on Xarelto.  ? ?Patient presents today for follow-up.  Here with wife and daughter.  Patient doing relatively well.  He has residual left-sided deficits from his stroke and is essentially wheelchair-bound right now.  He states he has been compliant with Xarelto  since his stroke and is tolerating it well with no abnormal bleeding.  He is still in atrial fibrillation today but is rate controlled.  He is asymptomatic with this.  No chest pain, shortness of breath, orthopnea, PND, palpitations, lightheadedness, dizziness, syncope.  He does have obvious lower extremity edema (worse on the left) since the time of his stroke.  He states left leg is always larger than the right.  They have had trouble finding compression stockings at work for him.  His PCP previously recommended starting Lasix 20 mg daily along with a potassium supplement back in the fall but patient never did this.  His BP is well controlled today but he states it is normally above goal at home in the 130s-150s/80s-90s. ? ?Past Medical History:  ?Diagnosis Date  ? AF (atrial fibrillation) ([place])   ? 2D ECHO, 03/01/1987 - normal; NUCLEAR STRESS TEST, 04/10/2006 - EF 65%, no ischemia  ? Bruit   ? CAROTID DOPPLER, 09/20/2008 - normal, no evidence of diameter reduction, significant tortuousity or any other vascular abnormality  ? Cerebrovascular accident [facility]) 2003  ? history of small vessal lacunar stroke 2003  ? Diabetes mellitus type II   ? Glaucoma   ? Hyperlipidemia   ? Hypertension   ? Other specified cardiac dysrhythmias(427.89)   ? PSVT  ? Peripheral neuropathy   ? ? ?Past Surgical History:  ?Procedure Laterality Date  ? ANAL FISSURE REPAIR    ? CARDIAC CATHETERIZATION  02/02/2001  ? Normal LV systolic function, questionable mitral valve prolapse without mitral regurgitation, no evidence of CAD  ? CARDIAC  CATHETERIZATION  09/13/2008  ? Minimal CAD, normal LV systolic function, no evidence of renal artery stenosis, distal aortic disease, or iliac disease  ? ? ?Current Medications: ?Current Meds  ?Medication Sig  ? B-D ULTRAFINE III SHORT PEN 31G X 8 MM MISC USE FOUR TIMES DAILY BEFORE MEALS AND AT BEDTIME  ? Blood Glucose Monitoring Suppl (ACCU-CHEK AVIVA PLUS) w/Device KIT USED TO CHECK BLOOD SUGAR 3 TIMES DAILY OR  PRN.  ? carvedilol (COREG) 3.125 MG tablet Take 1 tablet (3.125 mg total) by mouth 2 (two) times daily.  ? gabapentin (NEURONTIN) 100 MG capsule TAKE 1 CAPSULE(100 MG) BY MOUTH AT BEDTIME  ? HUMALOG KWIKPEN 100 UNIT/ML KwikPen INJECT 50 UNITS UNDER THE SKIN AT BREAKFAST, 35 UNITS WITH LUNCH, AND 50 UNITS WITH SUPPER  ? hydrochlorothiazide (HYDRODIURIL) 25 MG tablet Take 1 tablet (25 mg total) by mouth daily.  ? LANTUS SOLOSTAR 100 UNIT/ML Solostar Pen ADMINISTER 60 UNITS UNDER THE SKIN AT 10 PM  ? latanoprost (XALATAN) 0.005 % ophthalmic solution 1 drop daily.   ? lisinopril (ZESTRIL) 40 MG tablet TAKE 1 TABLET(40 MG) BY MOUTH DAILY  ? lovastatin (MEVACOR) 40 MG tablet TAKE 1 TABLET(40 MG) BY MOUTH AT BEDTIME  ? metFORMIN (GLUCOPHAGE) 1000 MG tablet TAKE 1 TABLET(1000 MG) BY MOUTH TWICE DAILY WITH A MEAL  ? methocarbamol (ROBAXIN) 500 MG tablet Take 1 tablet (500 mg total) by mouth 4 (four) times daily. Take 500 mg by mouth 4 (four) times daily.  ? methylPREDNISolone (MEDROL) 4 MG tablet Take 1 tablet (4 mg total) by mouth daily for 14 days, THEN 0.5 tablets (2 mg total) daily for 14 days.  ? nystatin (MYCOSTATIN/NYSTOP) powder Apply 1 application topically 3 (three) times daily.  ? potassium chloride SA (KLOR-CON M20) 20 MEQ tablet Take 1 tablet by mouth as needed when Lasix is taken  ? PRODIGY NO CODING BLOOD GLUC test strip USE THREE TIMES DAILY AS DIRECTED  ? Prodigy Twist Top Lancets 28G MISC TEST BLOOD GLUCOSE 3 TIMES DAILY AS DIRECTED  ? rOPINIRole (REQUIP) 1 MG tablet TAKE 1 TABLET BY MOUTH THREE TIMES DAILY  ? timolol (TIMOPTIC-XR) 0.5 % ophthalmic gel-forming Place 1 drop into both eyes daily.   ? XARELTO 20 MG TABS tablet TAKE 1 TABLET BY MOUTH EVERY EVENING  ? [DISCONTINUED] furosemide (LASIX) 20 MG tablet Take 40 mg by mouth. Take 20 mg daily by mouth  ? [DISCONTINUED] metoprolol tartrate (LOPRESSOR) 25 MG tablet Take 0.5 tablets (12.5 mg total) by mouth 2 (two) times daily.  ? ?Current  Facility-Administered Medications for the 01/04/22 encounter (Office Visit) with Darreld Mclean, PA-C  ?Medication  ? 0.9 %  sodium chloride infusion  ?  ? ?Allergies:   Cheratussin ac [guaifenesin-codeine] and Verapamil  ? ?Social History  ? ?Socioeconomic History  ? Marital status: Married  ?  Spouse name: Not on file  ? Number of children: Not on file  ? Years of education: Not on file  ? Highest education level: Not on file  ?Occupational History  ? Not on file  ?Tobacco Use  ? Smoking status: Never  ? Smokeless tobacco: Never  ?Vaping Use  ? Vaping Use: Never used  ?Substance and Sexual Activity  ? Alcohol use: No  ? Drug use: No  ? Sexual activity: Not on file  ?Other Topics Concern  ? Not on file  ?Social History Narrative  ? Non smoker   ? On disability  ? ?Social Determinants of Health  ? ?Financial  Resource Strain: Not on file  ?Food Insecurity: Not on file  ?Transportation Needs: Not on file  ?Physical Activity: Not on file  ?Stress: Not on file  ?Social Connections: Not on file  ?  ? ?Family History: ?The patient's family history is negative for Diabetes and Colon cancer. ? ?ROS:   ?Please see the history of present illness.    ? ?EKGs/Labs/Other Studies Reviewed:   ? ?The following studies were reviewed today: ? ?Echocardiogram 05/06/2021 ([facility]): ?Impression: ?Left Ventricle: Systolic function is normal. EF: 55-60%.  ?  Left Ventricle: Wall motion is normal.  ?  Left Atrium: Left atrium is mildly dilated.  ?  Left Atrium: Injection of agitated saline documents no interatrial  ?shunt.  ?  Aortic Valve: There is no regurgitation or stenosis.  ?  Mitral Valve: There is trace regurgitation.  ?  Tricuspid Valve: There is trace regurgitation. ? ?EKG:  EKG ordered today. EKG personally reviewed and demonstrates atrial fibrillation, rate 66 bpm, with low voltage QRS and nonspecific T wave flattening.  Left axis deviation.  QTc 419 ms. ? ?Recent Labs: ?No results found for requested labs within last 8760  hours.  ?Recent Lipid Panel ?   ?Component Value Date/Time  ? CHOL 136 03/02/2020 0909  ? TRIG 147.0 03/02/2020 0909  ? HDL 36.00 (L) 03/02/2020 0909  ? CHOLHDL 4 03/02/2020 0909  ? VLDL 29.4 03/02/2020 0909  ? [place] 7

## 2022-01-02 ENCOUNTER — Ambulatory Visit (INDEPENDENT_AMBULATORY_CARE_PROVIDER_SITE_OTHER): Payer: Medicare Other | Admitting: Specialist

## 2022-01-02 ENCOUNTER — Encounter: Payer: Self-pay | Admitting: Specialist

## 2022-01-02 VITALS — BP 146/84 | HR 90 | Ht 72.0 in | Wt 250.0 lb

## 2022-01-02 DIAGNOSIS — I69398 Other sequelae of cerebral infarction: Secondary | ICD-10-CM | POA: Diagnosis not present

## 2022-01-02 DIAGNOSIS — R252 Cramp and spasm: Secondary | ICD-10-CM

## 2022-01-02 DIAGNOSIS — M4812 Ankylosing hyperostosis [Forestier], cervical region: Secondary | ICD-10-CM

## 2022-01-02 DIAGNOSIS — M2450 Contracture, unspecified joint: Secondary | ICD-10-CM

## 2022-01-02 DIAGNOSIS — M48061 Spinal stenosis, lumbar region without neurogenic claudication: Secondary | ICD-10-CM | POA: Diagnosis not present

## 2022-01-02 DIAGNOSIS — M5442 Lumbago with sciatica, left side: Secondary | ICD-10-CM | POA: Diagnosis not present

## 2022-01-02 MED ORDER — METHYLPREDNISOLONE 4 MG PO TABS: ORAL_TABLET | ORAL | 0 refills | Status: AC

## 2022-01-02 NOTE — Patient Instructions (Signed)
Avoid frequent bending and stooping  ?No lifting greater than 10-25lbs. ?May use ice or moist heat for pain. ?Weight loss is of benefit. ?Best medication for lumbar disc disease is arthritis medications but you should not take these due to use of a blood thinner. Will try a low dose of steroid to relieve inflamation of the joints in the spine, consider epidural steroid injection vs facet injections if no response to the steroid. Marland Kitchen ?Exercise is important to improve your indurance and does allow people to function better inspite of back pain. ?  ?

## 2022-01-02 NOTE — Progress Notes (Signed)
? ?Office Visit Note ?  ?Patient: Jimmy Carr           ?Date of Birth: 1940-12-02           ?MRN: [id number] ?Visit Date: 01/02/2022 ?             ?Requested by: [name], MD ?[address] ?[place],  [place] [address] ?PCP: [name], MD ? ? ?Assessment & Plan: ?Visit Diagnoses:  ?1. Right-sided low back pain with left-sided sciatica, unspecified chronicity   ?2. 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 Flexion contractures   ?4. 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 Spinal stenosis of lumbar region without neurogenic claudication   ? ? ?Plan: Avoid frequent bending and stooping  ?No lifting greater than 10-25lbs. ?May use ice or moist heat for pain. ?Weight loss is of benefit. ?Best medication for lumbar disc disease is arthritis medications but you should not take these due to use of a blood thinner. Will try a low dose of steroid to relieve inflamation of the joints in the spine, consider epidural steroid injection vs facet injections if no response to the steroid. Marland Kitchen ?Exercise is important to improve your indurance and does allow people to function better inspite of back pain. ? ?Follow-Up Instructions: Return in about 6 weeks (around 02/13/2022).  ? ?Orders:  ?No orders of the defined types were placed in this encounter. ? ?Meds ordered this encounter  ?Medications  ? methylPREDNISolone (MEDROL) 4 MG tablet  ?  Sig: Take 1 tablet (4 mg total) by mouth daily for 14 days, THEN 0.5 tablets (2 mg total) daily for 14 days.  ?  Dispense:  21 tablet  ?  Refill:  0  ? ? ? ? Procedures: ?No procedures performed ? ? ?Clinical Data: ?No additional findings. ? ? ?Subjective: ?Chief Complaint  ?Patient presents with  ? Lower Back - Follow-up  ? Left Knee - Follow-up  ? ? ?HPI ? ?Review of Systems ? ? ?Objective: ?Vital Signs: BP (!) 146/84 (BP Location: Left Arm, Patient Position: Sitting)   Pulse 90   Ht 6' (1.829 m)   Wt 250 lb (113.4 kg)   BMI 33.91 kg/m?  ? ?Physical  Exam ? ?Ortho Exam ? ?Specialty Comments:  ?No specialty comments available. ? ?Imaging: ?No results found. ? ? ?PMFS History: ?Patient Active Problem List  ? Diagnosis Date Noted  ? Dizziness 12/10/2018  ? Pure hypercholesterolemia 04/21/2018  ? Hx of adenomatous colonic polyps 12/31/2017  ? Lightheadedness 03/20/2017  ? H/O: stroke 03/20/2017  ? Splenomegaly 11/01/2015  ? Chronic liver disease 11/01/2015  ? Hollenhorst plaque, right eye 05/11/2015  ? 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 Nuclear cataract of both eyes 10/04/2014  ? Primary open angle glaucoma of both eyes, severe stage 02/14/2014  ? Chest pain 11/02/2013  ? CAD in native artery 11/02/2013  ? PAF (paroxysmal atrial fibrillation) ([facility]) 03/16/2013  ? Erectile dysfunction 03/11/2011  ? SHINGLES 03/28/2010  ? URINARY FREQUENCY 11/07/2009  ? RUQ PAIN 01/24/2009  ? Diabetes mellitus with neuropathy ([place]) 02/24/2007  ? Hyperlipidemia 02/24/2007  ? PERIPHERAL NEUROPATHY 02/24/2007  ? Essential hypertension 02/24/2007  ? History of cardiovascular disorder 02/24/2007  ? ?Past Medical History:  ?Diagnosis Date  ? AF (atrial fibrillation) ([place])   ? 2D ECHO, 03/01/1987 - normal; NUCLEAR STRESS TEST, 04/10/2006 - EF 65%, no ischemia  ? Bruit   ? CAROTID  DOPPLER, 09/20/2008 - normal, no evidence of diameter reduction, significant tortuousity or any other vascular abnormality  ? Cerebrovascular accident [facility]) 2003  ? history of small vessal lacunar stroke 2003  ? Diabetes mellitus type II   ? Glaucoma   ? Hyperlipidemia   ? Hypertension   ? Other specified cardiac dysrhythmias(427.89)   ? PSVT  ? Peripheral neuropathy   ?  ?Family History  ?Problem Relation Age of Onset  ? Diabetes Neg Hx   ? Colon cancer Neg Hx   ?  ?Past Surgical History:  ?Procedure Laterality Date  ? ANAL FISSURE REPAIR    ? CARDIAC CATHETERIZATION  02/02/2001  ? Normal LV systolic function, questionable mitral  valve prolapse without mitral regurgitation, no evidence of CAD  ? CARDIAC CATHETERIZATION  09/13/2008  ? Minimal CAD, normal LV systolic function, no evidence of renal artery stenosis, distal aortic disease, or iliac disease  ? ?Social History  ? ?Occupational History  ? Not on file  ?Tobacco Use  ? Smoking status: Never  ? Smokeless tobacco: Never  ?Vaping Use  ? Vaping Use: Never used  ?Substance and Sexual Activity  ? Alcohol use: No  ? Drug use: No  ? Sexual activity: Not on file  ? ? ? ? ? ? ?

## 2022-01-04 ENCOUNTER — Encounter: Payer: Self-pay | Admitting: Student

## 2022-01-04 ENCOUNTER — Ambulatory Visit (INDEPENDENT_AMBULATORY_CARE_PROVIDER_SITE_OTHER): Payer: Medicare Other | Admitting: Student

## 2022-01-04 VITALS — BP 118/82 | HR 66 | Ht 72.0 in | Wt 250.0 lb

## 2022-01-04 DIAGNOSIS — E785 Hyperlipidemia, unspecified: Secondary | ICD-10-CM

## 2022-01-04 DIAGNOSIS — E118 Type 2 diabetes mellitus with unspecified complications: Secondary | ICD-10-CM | POA: Diagnosis not present

## 2022-01-04 DIAGNOSIS — I2510 Atherosclerotic heart disease of native coronary artery without angina pectoris: Secondary | ICD-10-CM | POA: Diagnosis not present

## 2022-01-04 DIAGNOSIS — R600 Localized edema: Secondary | ICD-10-CM | POA: Diagnosis not present

## 2022-01-04 DIAGNOSIS — I4819 Other persistent atrial fibrillation: Secondary | ICD-10-CM | POA: Diagnosis not present

## 2022-01-04 DIAGNOSIS — I10 Essential (primary) hypertension: Secondary | ICD-10-CM

## 2022-01-04 DIAGNOSIS — Z794 Long term (current) use of insulin: Secondary | ICD-10-CM | POA: Diagnosis not present

## 2022-01-04 MED ORDER — CARVEDILOL 3.125 MG PO TABS: 3.1250 mg | ORAL_TABLET | Freq: Two times a day (BID) | ORAL | 3 refills | Status: DC

## 2022-01-04 MED ORDER — POTASSIUM CHLORIDE CRYS ER 20 MEQ PO TBCR: EXTENDED_RELEASE_TABLET | ORAL | 3 refills | Status: DC

## 2022-01-04 MED ORDER — FUROSEMIDE 20 MG PO TABS: 20.0000 mg | ORAL_TABLET | Freq: Every day | ORAL | 0 refills | Status: DC

## 2022-01-04 NOTE — Patient Instructions (Addendum)
Medication Instructions:  ?STOP Metoprolol Tartrate (Lopressor)  ?START Carvedilol (Coreg) 3.125 mg 2 times a day ?START Potassium 20 meq as needed when Lasix is taken  ?TAKE Lasix 40 mg (2 tablets) daily for 3 days, then resume 20 mg as needed  ? ?*If you need a refill on your cardiac medications before your next appointment, please call your pharmacy* ? ?Lab Work: ?NONE ordered at this time of appointment  ? ?If you have labs (blood work) drawn today and your tests are completely normal, you will receive your results only by: ?MyChart Message (if you have MyChart) OR ?A paper copy in the mail ?If you have any lab test that is abnormal or we need to change your treatment, we will call you to review the results. ? ?Testing/Procedures: ?NONE ordered at this time of appointment  ? ?Follow-Up: ?At [facility], you and your health needs are our priority.  As part of our continuing mission to provide you with exceptional heart care, we have created designated Provider Care Teams.  These Care Teams include your primary Cardiologist (physician) and Advanced Practice Providers (APPs -  Physician Assistants and Nurse Practitioners) who all work together to provide you with the care you need, when you need it. ?  ?Your next appointment:   ?6 month(s) ? ?The format for your next appointment:   ?In Person ? ?Provider:   ?Pixie Casino, MD   ? ?Other Instructions ?CONTINUE to monitor blood pressure at home. If BP is persistently greater than 130/80 please give our office a call.  ?Take Lisinopril in the mornings along with the Carvedilol in the morning and in the evenings  ? ? ? ?Low-Sodium Eating Plan ?Sodium, which is an element that makes up salt, helps you maintain a healthy balance of fluids in your body. Too much sodium can increase your blood pressure and cause fluid and waste to be held in your body. ?Your health care provider or dietitian may recommend following this plan if you have high blood pressure  (hypertension), kidney disease, liver disease, or heart failure. Eating less sodium can help lower your blood pressure, reduce swelling, and protect your heart, liver, and kidneys. ?What are tips for following this plan? ?Reading food labels ?The Nutrition Facts label lists the amount of sodium in one serving of the food. If you eat more than one serving, you must multiply the listed amount of sodium by the number of servings. ?Choose foods with less than 140 mg of sodium per serving. ?Avoid foods with 300 mg of sodium or more per serving. ?Shopping ? ?Look for lower-sodium products, often labeled as "low-sodium" or "no salt added." ?Always check the sodium content, even if foods are labeled as "unsalted" or "no salt added." ?Buy fresh foods. ?Avoid canned foods and pre-made or frozen meals. ?Avoid canned, cured, or processed meats. ?Buy breads that have less than 80 mg of sodium per slice. ?Cooking ? ?Eat more home-cooked food and less restaurant, buffet, and fast food. ?Avoid adding salt when cooking. Use salt-free seasonings or herbs instead of table salt or sea salt. Check with your health care provider or pharmacist before using salt substitutes. ?Cook with plant-based oils, such as canola, sunflower, or olive oil. ?Meal planning ?When eating at a restaurant, ask that your food be prepared with less salt or no salt, if possible. Avoid dishes labeled as brined, pickled, cured, smoked, or made with soy sauce, miso, or teriyaki sauce. ?Avoid foods that contain MSG (monosodium glutamate). MSG is  sometimes added to [place] food, bouillon, and some canned foods. ?Make meals that can be grilled, baked, poached, roasted, or steamed. These are generally made with less sodium. ?General information ?Most people on this plan should limit their sodium intake to 1,500-2,000 mg (milligrams) of sodium each day. ?What foods should I eat? ?Fruits ?Fresh, frozen, or canned fruit. Fruit juice. ?Vegetables ?Fresh or frozen  vegetables. "No salt added" canned vegetables. "No salt added" tomato sauce and paste. Low-sodium or reduced-sodium tomato and vegetable juice. ?Grains ?Low-sodium cereals, including oats, puffed wheat and rice, and shredded wheat. Low-sodium crackers. Unsalted rice. Unsalted pasta. Low-sodium bread. Whole-grain breads and whole-grain pasta. ?Meats and other proteins ?Fresh or frozen (no salt added) meat, poultry, seafood, and fish. Low-sodium canned tuna and salmon. Unsalted nuts. Dried peas, beans, and lentils without added salt. Unsalted canned beans. Eggs. Unsalted nut butters. ?Dairy ?Milk. Soy milk. Cheese that is naturally low in sodium, such as ricotta cheese, fresh mozzarella, or Swiss cheese. Low-sodium or reduced-sodium cheese. Cream cheese. Yogurt. ?Seasonings and condiments ?Fresh and dried herbs and spices. Salt-free seasonings. Low-sodium mustard and ketchup. Sodium-free salad dressing. Sodium-free light mayonnaise. Fresh or refrigerated horseradish. Lemon juice. Vinegar. ?Other foods ?Homemade, reduced-sodium, or low-sodium soups. Unsalted popcorn and pretzels. Low-salt or salt-free chips. ?The items listed above may not be a complete list of foods and beverages you can eat. Contact a dietitian for more information. ?What foods should I avoid? ?Vegetables ?Sauerkraut, pickled vegetables, and relishes. Olives. [place] fries. Onion rings. Regular canned vegetables (not low-sodium or reduced-sodium). Regular canned tomato sauce and paste (not low-sodium or reduced-sodium). Regular tomato and vegetable juice (not low-sodium or reduced-sodium). Frozen vegetables in sauces. ?Grains ?Instant hot cereals. Bread stuffing, pancake, and biscuit mixes. Croutons. Seasoned rice or pasta mixes. Noodle soup cups. Boxed or frozen macaroni and cheese. Regular salted crackers. Self-rising flour. ?Meats and other proteins ?Meat or fish that is salted, canned, smoked, spiced, or pickled. Precooked or cured meat, such as  sausages or meat loaves. Berniece Salines. Ham. Pepperoni. Hot dogs. Corned beef. Chipped beef. Salt pork. Jerky. Pickled herring. Anchovies and sardines. Regular canned tuna. Salted nuts. ?Dairy ?Processed cheese and cheese spreads. Hard cheeses. Cheese curds. Blue cheese. Feta cheese. String cheese. Regular cottage cheese. Buttermilk. Canned milk. ?Fats and oils ?Salted butter. Regular margarine. Ghee. Bacon fat. ?Seasonings and condiments ?Onion salt, garlic salt, seasoned salt, table salt, and sea salt. Canned and packaged gravies. Worcestershire sauce. Tartar sauce. Barbecue sauce. Teriyaki sauce. Soy sauce, including reduced-sodium. Steak sauce. Fish sauce. Oyster sauce. Cocktail sauce. Horseradish that you find on the shelf. Regular ketchup and mustard. Meat flavorings and tenderizers. Bouillon cubes. Hot sauce. Pre-made or packaged marinades. Pre-made or packaged taco seasonings. Relishes. Regular salad dressings. Salsa. ?Other foods ?Salted popcorn and pretzels. Corn chips and puffs. Potato and tortilla chips. Canned or dried soups. Pizza. Frozen entrees and pot pies. ?The items listed above may not be a complete list of foods and beverages you should avoid. Contact a dietitian for more information. ?Summary ?Eating less sodium can help lower your blood pressure, reduce swelling, and protect your heart, liver, and kidneys. ?Most people on this plan should limit their sodium intake to 1,500-2,000 mg (milligrams) of sodium each day. ?Canned, boxed, and frozen foods are high in sodium. Restaurant foods, fast foods, and pizza are also very high in sodium. You also get sodium by adding salt to food. ?Try to cook at home, eat more fresh fruits and vegetables, and eat less fast food and canned,  processed, or prepared foods. ?This information is not intended to replace advice given to you by your health care provider. Make sure you discuss any questions you have with your health care provider. ?Document Revised: 10/22/2019  Document Reviewed: 08/18/2019 ?Elsevier Patient Education ? 2022 [place]. ? ? ?

## 2022-01-09 ENCOUNTER — Ambulatory Visit (INDEPENDENT_AMBULATORY_CARE_PROVIDER_SITE_OTHER): Payer: Medicare Other | Admitting: Family Medicine

## 2022-01-09 VITALS — BP 138/78 | HR 73 | Temp 97.7°F

## 2022-01-09 DIAGNOSIS — Z8673 Personal history of transient ischemic attack (TIA), and cerebral infarction without residual deficits: Secondary | ICD-10-CM

## 2022-01-09 DIAGNOSIS — E782 Mixed hyperlipidemia: Secondary | ICD-10-CM

## 2022-01-09 DIAGNOSIS — R600 Localized edema: Secondary | ICD-10-CM

## 2022-01-09 DIAGNOSIS — I2510 Atherosclerotic heart disease of native coronary artery without angina pectoris: Secondary | ICD-10-CM

## 2022-01-09 DIAGNOSIS — I69354 Hemiplegia and hemiparesis following cerebral infarction affecting left non-dominant side: Secondary | ICD-10-CM | POA: Diagnosis not present

## 2022-01-09 DIAGNOSIS — I10 Essential (primary) hypertension: Secondary | ICD-10-CM | POA: Diagnosis not present

## 2022-01-09 NOTE — Progress Notes (Signed)
Subjective:  ? ? Patient ID: Jimmy Carr, male    DOB: Apr 07, 1941, 81 y.o.   MRN: [id number] ? ?Chief Complaint  ?Patient presents with  ? Follow-up  ?  Pain of knee ?Discuss medication changes from other providers.  ?Pt accompanied by his daughter and wife. ? ?HPI ?Patient is a 81 yo male with pmh sig for paroxysmal A-fib on Xarelto, HTN, HLD, DM 2, h/o CVA with residual left-sided hemiparesis, CAD who was seen today for f/u.  Pt states he has been feeling good.  Wanted to review medications as lopressor switched to Coreg 3.125 mg BID by Cardiology. Pt has not started the med yet as he wanted to be sure it was ok.  Pt also states he is ready to try lasix 20 mg.  On med list but pt was hesitant to try it due to possibility of increased urination.  Has been taking HCTZ 12.5 mg.  BP typically 130s-150s/80s-90s at home.  In outpt PT. ? ?Past Medical History:  ?Diagnosis Date  ? AF (atrial fibrillation) ([place])   ? 2D ECHO, 03/01/1987 - normal; NUCLEAR STRESS TEST, 04/10/2006 - EF 65%, no ischemia  ? Bruit   ? CAROTID DOPPLER, 09/20/2008 - normal, no evidence of diameter reduction, significant tortuousity or any other vascular abnormality  ? Cerebrovascular accident [facility]) 2003  ? history of small vessal lacunar stroke 2003  ? Diabetes mellitus type II   ? Glaucoma   ? Hyperlipidemia   ? Hypertension   ? Other specified cardiac dysrhythmias(427.89)   ? PSVT  ? Peripheral neuropathy   ? ? ?Allergies  ?Allergen Reactions  ? Cheratussin Ac [Guaifenesin-Codeine] Nausea And Vomiting  ? Verapamil   ? ? ?ROS ?General: Denies fever, chills, night sweats, changes in weight, changes in appetite ?HEENT: Denies headaches, ear pain, changes in vision, rhinorrhea, sore throat ?CV: Denies CP, palpitations, SOB, orthopnea +LE edema ?Pulm: Denies SOB, cough, wheezing ?GI: Denies abdominal pain, nausea, vomiting, diarrhea, constipation ?GU: Denies dysuria, hematuria, frequency ?Msk: Denies muscle cramps, joint pains +L knee pain ?Neuro:  Denies weakness, numbness, tingling ?Skin: Denies rashes, bruising ?Psych: Denies depression, anxiety, hallucinations ?   ?Objective:  ?  ?Blood pressure 138/78, pulse 73, temperature 97.7 ?F (36.5 ?C), temperature source Oral, SpO2 96 %. ? ?Gen. Pleasant, well-nourished, in no distress, normal affect   ?HEENT: [place]/AT, face symmetric, conjunctiva clear, no scleral icterus, PERRLA, EOMI, nares patent without drainage, pharynx without erythema or exudate. ?Neck: No JVD, no thyromegaly, no carotid bruits ?Lungs: no accessory muscle use, CTAB, no wheezes or rales ?Cardiovascular: RRR, no m/r/g, L LE edema 1+ to knee> R LE ?Musculoskeletal: L hemiparesis.  LUE with decreased ROM s/p CVA. No cyanosis or clubbing, normal tone ?Neuro:  A&Ox3, CN II-XII intact, gait not assessed as sitting in transport wheelchair ?Skin:  Warm, dry, intact.  Chronic venous stasis changes starting on left LE including reddish discoloration without increased warmth. ? ? ?Wt Readings from Last 3 Encounters:  ?01/04/22 250 lb (113.4 kg)  ?01/02/22 250 lb (113.4 kg)  ?10/22/21 240 lb (108.9 kg)  ? ? ?Lab Results  ?Component Value Date  ? WBC 8.8 08/04/2020  ? HGB 16.6 08/04/2020  ? HCT 51.0 08/04/2020  ? PLT 166 08/04/2020  ? GLUCOSE 140 (H) 08/04/2020  ? CHOL 136 03/02/2020  ? TRIG 147.0 03/02/2020  ? HDL 36.00 (L) 03/02/2020  ? LDLDIRECT 67.0 05/16/2015  ? [place] 70 03/02/2020  ? ALT 21 07/14/2020  ? AST 17 07/14/2020  ?  NA 139 08/04/2020  ? K 3.9 08/04/2020  ? CL 102 08/04/2020  ? CREATININE 1.36 (H) 08/04/2020  ? BUN 20 08/04/2020  ? CO2 25 08/04/2020  ? TSH 1.86 12/31/2017  ? PSA 1.92 03/02/2020  ? HGBA1C 7.9 (H) 03/02/2020  ? MICROALBUR 2.0 (H) 12/31/2017  ? ? ?Assessment/Plan: ? ?Essential hypertension ?-Stable ?-Patient advised okay to start Coreg 3.125 mg twice daily as suggested by cardiology. ?-We will d/c Lopressor ?-Pt wishes to try Lasix 20 mg daily with K. Dur 20 mEq.  Advised to hold hydrochlorothiazide if trying Lasix. ?-Continue  lifestyle modifications including decreasing sodium intake ?-Continue checking BP at home ? ?History of CVA (cerebrovascular accident) ?-Discussed the importance of BP control ?-Continue statin ?-Continue PT ?-Has upcoming appointment with PM and R ? ?Hemiparesis affecting left side as late effect of cerebrovascular accident [facility]) ?-Continue working with PT ?-Encouraged to keep appointment with PM and R ? ?Mixed hyperlipidemia ?-Total cholesterol 135, triglycerides 155, HDL 35, LDL 69. ?-Continue lovastatin 40 mg. ?-Given history of CVA LDL goal less than 55. ?-We will reevaluate switching to high intensity statin at next OFV given recent medication changes ?-Continue diet modifications ?-We will recheck lipid panel at next office visit. ? ?Edema of left lower extremity ?-Advised residual effect of CVA ?-Discussed decreasing sodium intake, elevating LEs when sitting ?-Unable to use compression socks/TED hose is difficult to get on ?-We will start Lasix 20 mg daily to see if patient notices improvement in edema.  If no improvement in edema stop Lasix and restart HCTZ 25 mg. ? ?F/u in 1-3 months as needed ? ?Grier Mitts, MD ?

## 2022-01-17 ENCOUNTER — Telehealth: Payer: Self-pay | Admitting: Family Medicine

## 2022-01-17 NOTE — Telephone Encounter (Signed)
Pateint would like to speak with Dr Volanda Napoleon or CMA in regards to the new BP meds he was placed on 1 week ago. Patient states that the medication is not working for him. ? ?He would like a call back.  ?

## 2022-01-18 ENCOUNTER — Encounter: Payer: Self-pay | Admitting: Family Medicine

## 2022-01-24 ENCOUNTER — Telehealth: Payer: Self-pay | Admitting: Family Medicine

## 2022-01-24 NOTE — Telephone Encounter (Signed)
Pt call and stated the bp medication is not working he stated when he get it is running 150 to 160 over 90 to 100 pt stated he need a call back. ?

## 2022-01-24 NOTE — Telephone Encounter (Signed)
Which medication the new beta blocker that Cardiology started you on?   ?

## 2022-01-25 NOTE — Telephone Encounter (Signed)
See notes on other telephone encounter.  ?

## 2022-02-04 NOTE — Telephone Encounter (Signed)
If pt is not having muscle spasm, then he does not need to take robaxin 4 x per day. ?

## 2022-02-06 ENCOUNTER — Encounter: Payer: Self-pay | Admitting: Rehabilitative and Restorative Service Providers"

## 2022-02-06 NOTE — Therapy (Signed)
PHYSICAL THERAPY DISCHARGE SUMMARY ? ?Visits from Start of Care: 8 ? ?Current functional level related to goals / functional outcomes: ?PT Short Term Goals - 11/13/21 1300   ?  ?    ?     ?  PT SHORT TERM GOAL #1  ?  Title The patient will be able to perform HEP with assist from family.   ?  Baseline CONTINUE ALL STGS to new goal target date--- the patient has been out for 3 weeks due to Covid and all goals still appropriate.   ?  Time 4   ?  Period Weeks   ?  Status Revised   ?  Target Date 12/08/21   ?     ?  PT SHORT TERM GOAL #2  ?  Title The patient will move sit<>stand with CGA to L PFRW.   ?  Time 4   ?  Period Weeks   ?  Status Revised   ?  Target Date 12/08/21   ?     ?  PT SHORT TERM GOAL #3  ?  Title The patient will move sit<>supine with supervision in therapy (without bar he needs at home) to demo improving mobility.   ?  Time 4   ?  Period Weeks   ?  Status Revised   ?  Target Date 12/08/21   ?     ?  PT SHORT TERM GOAL #4  ?  Title The patient will ambulate with L PFRW x 100 feet with min A.   ?  Time 4   ?  Period Weeks   ?  Status Revised   ?  Target Date 12/08/21   ?     ?  PT SHORT TERM GOAL #5  ?  Title The patient will be further assessed on stairs and goal to follow.   ?  Time 4   ?  Period Weeks   ?  Status Revised   ?  Target Date 12/08/21   ?  ?   ?  ?  ?   ?  ?  ?  ?  PT Long Term Goals - 10/09/21 1407   ?  ?    ?     ?  PT LONG TERM GOAL #1  ?  Title The patient will be able to perform HEP progression with min A from family.   ?  Time 8   ?  Period Weeks   ?  Target Date 12/08/21   ?     ?  PT LONG TERM GOAL #2  ?  Title The patient will move sit<>stand with supervision to RW and fasten L UE strap for PFRW use.   ?  Time 8   ?  Period Weeks   ?  Target Date 12/08/21   ?     ?  PT LONG TERM GOAL #3  ?  Title The patient will ambulate for household distances with L PFRW x 150 ft with CGA without loss of balance.   ?  Time 8   ?  Period Weeks   ?  Target Date 12/08/21   ?     ?  PT LONG TERM  GOAL #4  ?  Title The patient will negotiate 4 steps with R sided handrail and CGA to be able to access his bathroom at home.   ?  Time 8   ?  Period Weeks   ?  Target Date 12/08/21   ?     ?  PT LONG TERM GOAL #5  ?  Title The patient will imrpove Berg balance score from 4/56 to > or equla to 16/56 to demo improving steady state balance for ADLs and mobility.   ?  Time 8   ?  Period Weeks   ?  Target Date 12/08/21   ?  ?   ?  ?  ?   ?  ? Long term goals not assessed-- the patient did not return. ?Remaining deficits: ?See lasr treatment note from 12/04/21 for last known status.  ?  ?Education / Equipment: ?Patient HEP, family education.  ? ?Patient agrees to discharge. Patient goals were not met. Patient is being discharged due to not returning since the last visit. ? ? ? ?Shawan Corella, PT ?[facility] ?Phone [phone] ?Fax [phone] ? ? ?

## 2022-02-08 NOTE — Telephone Encounter (Signed)
What has his heart rate been? ? ?Thank you! ?

## 2022-02-14 ENCOUNTER — Encounter: Payer: Medicare Other | Admitting: Physical Medicine & Rehabilitation

## 2022-02-18 ENCOUNTER — Other Ambulatory Visit: Payer: Self-pay | Admitting: Family Medicine

## 2022-02-18 ENCOUNTER — Ambulatory Visit: Payer: Medicare Other | Admitting: Specialist

## 2022-02-18 DIAGNOSIS — L304 Erythema intertrigo: Secondary | ICD-10-CM

## 2022-02-22 ENCOUNTER — Ambulatory Visit (INDEPENDENT_AMBULATORY_CARE_PROVIDER_SITE_OTHER): Payer: Medicare Other | Admitting: Family Medicine

## 2022-02-22 ENCOUNTER — Encounter: Payer: Self-pay | Admitting: Family Medicine

## 2022-02-22 VITALS — BP 120/80 | HR 88 | Temp 98.0°F

## 2022-02-22 DIAGNOSIS — I10 Essential (primary) hypertension: Secondary | ICD-10-CM | POA: Diagnosis not present

## 2022-02-22 DIAGNOSIS — L89311 Pressure ulcer of right buttock, stage 1: Secondary | ICD-10-CM

## 2022-02-22 DIAGNOSIS — I69354 Hemiplegia and hemiparesis following cerebral infarction affecting left non-dominant side: Secondary | ICD-10-CM | POA: Diagnosis not present

## 2022-02-22 DIAGNOSIS — I890 Lymphedema, not elsewhere classified: Secondary | ICD-10-CM | POA: Diagnosis not present

## 2022-02-22 DIAGNOSIS — Z8673 Personal history of transient ischemic attack (TIA), and cerebral infarction without residual deficits: Secondary | ICD-10-CM | POA: Diagnosis not present

## 2022-02-22 DIAGNOSIS — I2510 Atherosclerotic heart disease of native coronary artery without angina pectoris: Secondary | ICD-10-CM

## 2022-02-22 LAB — CBC WITH DIFFERENTIAL/PLATELET
Basophils Absolute: 0 10*3/uL (ref 0.0–0.1)
Basophils Relative: 0.5 % (ref 0.0–3.0)
Eosinophils Absolute: 0.1 10*3/uL (ref 0.0–0.7)
Eosinophils Relative: 1.8 % (ref 0.0–5.0)
HCT: 46.9 % (ref 39.0–52.0)
Hemoglobin: 15.2 g/dL (ref 13.0–17.0)
Lymphocytes Relative: 16.1 % (ref 12.0–46.0)
Lymphs Abs: 1.3 10*3/uL (ref 0.7–4.0)
MCHC: 32.5 g/dL (ref 30.0–36.0)
MCV: 83.3 fl (ref 78.0–100.0)
Monocytes Absolute: 0.8 10*3/uL (ref 0.1–1.0)
Monocytes Relative: 9.7 % (ref 3.0–12.0)
Neutro Abs: 5.9 10*3/uL (ref 1.4–7.7)
Neutrophils Relative %: 71.9 % (ref 43.0–77.0)
Platelets: 157 10*3/uL (ref 150.0–400.0)
RBC: 5.63 Mil/uL (ref 4.22–5.81)
RDW: 16.5 % — ABNORMAL HIGH (ref 11.5–15.5)
WBC: 8.1 10*3/uL (ref 4.0–10.5)

## 2022-02-22 LAB — COMPREHENSIVE METABOLIC PANEL
ALT: 24 U/L (ref 0–53)
AST: 19 U/L (ref 0–37)
Albumin: 3.8 g/dL (ref 3.5–5.2)
Alkaline Phosphatase: 83 U/L (ref 39–117)
BUN: 21 mg/dL (ref 6–23)
CO2: 29 mEq/L (ref 19–32)
Calcium: 9 mg/dL (ref 8.4–10.5)
Chloride: 100 mEq/L (ref 96–112)
Creatinine, Ser: 1.15 mg/dL (ref 0.40–1.50)
GFR: 60.77 mL/min (ref >60.00)
Glucose, Bld: 299 mg/dL — ABNORMAL HIGH (ref 70–99)
Potassium: 3.8 mEq/L (ref 3.5–5.1)
Sodium: 137 mEq/L (ref 135–145)
Total Bilirubin: 1 mg/dL (ref 0.2–1.2)
Total Protein: 6.7 g/dL (ref 6.0–8.3)

## 2022-02-22 NOTE — Progress Notes (Signed)
Subjective:    Patient ID: STANISLAUS KALTENBACH, male    DOB: 01/17/1941, 81 y.o.   MRN: [id number]  No chief complaint on file. Patient accompanied by his wife and daughter.  HPI Patient was seen today for follow-up on ongoing concerns.  Patient with an area on buttocks that has been slow to heal, started as a boil, then burst.  Patient's wife states she tries to keep the area clean, applies Neosporin, and has pt lay in the bed to allow it to get air.  Area is currently without redness or drainage. Pictures on phone of area this am.  Patient continuing to have bilateral LE edema left greater than right.  Now sitting in recliner throughout the day and to sleep which reduces edema in lower leg.  Patient unable to walk.  Can stand with assistance.  Blood pressure was elevated after receiving a steroid injection for low back pain, since then has been stable.  Pt no longer having back pain.  Past Medical History:  Diagnosis Date   AF (atrial fibrillation) (HCC)    2D ECHO, 03/01/1987 - normal; NUCLEAR STRESS TEST, 04/10/2006 - EF 65%, no ischemia   Bruit    CAROTID DOPPLER, 09/20/2008 - normal, no evidence of diameter reduction, significant tortuousity or any other vascular abnormality   Cerebrovascular accident [facility]) 2003   history of small vessal lacunar stroke 2003   Diabetes mellitus type II    Glaucoma    Hyperlipidemia    Hypertension    Other specified cardiac dysrhythmias(427.89)    PSVT   Peripheral neuropathy     Allergies  Allergen Reactions   Cheratussin Ac [Guaifenesin-Codeine] Nausea And Vomiting   Verapamil     ROS General: Denies fever, chills, night sweats, changes in weight, changes in appetite HEENT: Denies headaches, ear pain, changes in vision, rhinorrhea, sore throat CV: Denies CP, palpitations, SOB, orthopnea +LE edema Pulm: Denies SOB, cough, wheezing GI: Denies abdominal pain, nausea, vomiting, diarrhea, constipation GU: Denies dysuria, hematuria, frequency Msk:  Denies muscle cramps, joint pains Neuro: Denies weakness, numbness, tingling  +L hemiparesis Skin: Denies rashes, bruising  +wound on buttock Psych: Denies depression, anxiety, hallucinations  Objective:    Blood pressure 120/80, pulse 88, temperature 98 F (36.7 C), temperature source Oral, SpO2 95 %.  Gen. Pleasant, well-nourished, in no distress, normal affect   HEENT: [place]/AT, face symmetric, conjunctiva clear, no scleral icterus, PERRLA, EOMI, nares patent without drainage Lungs: no accessory muscle use, CTAB, no wheezes or rales Cardiovascular: RRR, no m/r/g, non pitting b/l edema LLE>RLE to lower thigh Abdomen: BS present, soft, NT/ND Musculoskeletal: No deformities, no cyanosis or clubbing, normal tone Neuro:  A&Ox3, CN II-XII intact, gait not assessed, sitting in transport wheelchair Skin:  Warm, no lesions/ rash.  Mild lymphadema skin changes bilateral Les, L>R.  Indention on lateral right lower leg/knee from folded wheelchair foot rest.   Wt Readings from Last 3 Encounters:  01/04/22 250 lb (113.4 kg)  01/02/22 250 lb (113.4 kg)  10/22/21 240 lb (108.9 kg)    Lab Results  Component Value Date   WBC 8.1 02/22/2022   HGB 15.2 02/22/2022   HCT 46.9 02/22/2022   PLT 157.0 02/22/2022   GLUCOSE 299 (H) 02/22/2022   CHOL 136 03/02/2020   TRIG 147.0 03/02/2020   HDL 36.00 (L) 03/02/2020   LDLDIRECT 67.0 05/16/2015   LDLCALC 70 03/02/2020   ALT 24 02/22/2022   AST 19 02/22/2022   NA 137 02/22/2022   K 3.8  02/22/2022   CL 100 02/22/2022   CREATININE 1.15 02/22/2022   BUN 21 02/22/2022   CO2 29 02/22/2022   TSH 1.86 12/31/2017   PSA 1.92 03/02/2020   HGBA1C 7.9 (H) 03/02/2020   MICROALBUR 2.0 (H) 12/31/2017    Assessment/Plan:  Lymphedema -Supportive care including elevating LEs, wrapping LEs with Ace bandages as compression socks difficult to get on. -Discussed the importance of ROM exercises -Discussed new referral to lymphadema clinic as previous referral too  far away. -will increase lasix to 40 mg with kdur for 2 days to see if edema improves, then return to regular dose of Lasix 20 mg daily.  Has med at home.  - Plan: CMP, CBC with Differential/Platelet, Ambulatory referral to Home Health  History of CVA (cerebrovascular accident)  -Maximize medical management -pt would benefit from continued PT. initially declined as concerned exercises will worsen low back pain, but willing to try.  Referral placed. - Plan: Ambulatory referral to [place] injury of right buttock, stage 1  -area improving.  No signs of infection. -keep are clean and dry -discussed need to rotate pt/changing position throughout the day -HH wound care - Plan: Ambulatory referral to [place] affecting left side as late effect of cerebrovascular accident [facility])  -continue PT as pt is not currently ambulatory -expressed concerns with family and pt of ability to continue caring for pt if unable to assist with transfers, etc. - Plan: Ambulatory referral to [place] hypertension -controlled -lifestyle modifications -continue current meds -We will have patient take Lasix 40 mg for the next 2 days with extra dose of Klor-Con for LE edema, then return to normal dosing.  - Plan: CMP  F/u in 1-2 months, sooner if needed.  Grier Mitts, MD

## 2022-02-26 DIAGNOSIS — L89312 Pressure ulcer of right buttock, stage 2: Secondary | ICD-10-CM | POA: Diagnosis not present

## 2022-02-26 DIAGNOSIS — Z7984 Long term (current) use of oral hypoglycemic drugs: Secondary | ICD-10-CM | POA: Diagnosis not present

## 2022-02-26 DIAGNOSIS — H409 Unspecified glaucoma: Secondary | ICD-10-CM | POA: Diagnosis not present

## 2022-02-26 DIAGNOSIS — I890 Lymphedema, not elsewhere classified: Secondary | ICD-10-CM | POA: Diagnosis not present

## 2022-02-26 DIAGNOSIS — I872 Venous insufficiency (chronic) (peripheral): Secondary | ICD-10-CM | POA: Diagnosis not present

## 2022-02-26 DIAGNOSIS — L97911 Non-pressure chronic ulcer of unspecified part of right lower leg limited to breakdown of skin: Secondary | ICD-10-CM | POA: Diagnosis not present

## 2022-02-26 DIAGNOSIS — Z9181 History of falling: Secondary | ICD-10-CM | POA: Diagnosis not present

## 2022-02-26 DIAGNOSIS — E1142 Type 2 diabetes mellitus with diabetic polyneuropathy: Secondary | ICD-10-CM | POA: Diagnosis not present

## 2022-02-26 DIAGNOSIS — Z794 Long term (current) use of insulin: Secondary | ICD-10-CM | POA: Diagnosis not present

## 2022-02-26 DIAGNOSIS — L97921 Non-pressure chronic ulcer of unspecified part of left lower leg limited to breakdown of skin: Secondary | ICD-10-CM | POA: Diagnosis not present

## 2022-02-26 DIAGNOSIS — I480 Paroxysmal atrial fibrillation: Secondary | ICD-10-CM | POA: Diagnosis not present

## 2022-02-26 DIAGNOSIS — I69354 Hemiplegia and hemiparesis following cerebral infarction affecting left non-dominant side: Secondary | ICD-10-CM | POA: Diagnosis not present

## 2022-02-26 DIAGNOSIS — I10 Essential (primary) hypertension: Secondary | ICD-10-CM | POA: Diagnosis not present

## 2022-02-26 DIAGNOSIS — I471 Supraventricular tachycardia: Secondary | ICD-10-CM | POA: Diagnosis not present

## 2022-02-26 DIAGNOSIS — I2510 Atherosclerotic heart disease of native coronary artery without angina pectoris: Secondary | ICD-10-CM | POA: Diagnosis not present

## 2022-02-26 DIAGNOSIS — E782 Mixed hyperlipidemia: Secondary | ICD-10-CM | POA: Diagnosis not present

## 2022-02-26 DIAGNOSIS — Z7901 Long term (current) use of anticoagulants: Secondary | ICD-10-CM | POA: Diagnosis not present

## 2022-02-26 DIAGNOSIS — E1151 Type 2 diabetes mellitus with diabetic peripheral angiopathy without gangrene: Secondary | ICD-10-CM | POA: Diagnosis not present

## 2022-02-27 ENCOUNTER — Telehealth: Payer: Self-pay | Admitting: Family Medicine

## 2022-02-27 NOTE — Telephone Encounter (Signed)
Skilled nursing 2x3/1x6 and 2 PRN . home health  aide 1x1/2x3. Wound care-just washed with warm water, dry dressing over lesions, wrapped with curlex and ace bandage. Insulin on his medication list pt does not take lantus, takes humalog 2xd instead of 3xd. Take furosimide not hctz

## 2022-02-28 DIAGNOSIS — L89312 Pressure ulcer of right buttock, stage 2: Secondary | ICD-10-CM | POA: Diagnosis not present

## 2022-02-28 DIAGNOSIS — I890 Lymphedema, not elsewhere classified: Secondary | ICD-10-CM | POA: Diagnosis not present

## 2022-02-28 DIAGNOSIS — I872 Venous insufficiency (chronic) (peripheral): Secondary | ICD-10-CM | POA: Diagnosis not present

## 2022-02-28 DIAGNOSIS — L97921 Non-pressure chronic ulcer of unspecified part of left lower leg limited to breakdown of skin: Secondary | ICD-10-CM | POA: Diagnosis not present

## 2022-02-28 DIAGNOSIS — E1151 Type 2 diabetes mellitus with diabetic peripheral angiopathy without gangrene: Secondary | ICD-10-CM | POA: Diagnosis not present

## 2022-02-28 DIAGNOSIS — I69354 Hemiplegia and hemiparesis following cerebral infarction affecting left non-dominant side: Secondary | ICD-10-CM | POA: Diagnosis not present

## 2022-03-01 ENCOUNTER — Telehealth: Payer: Self-pay | Admitting: Family Medicine

## 2022-03-01 NOTE — Telephone Encounter (Signed)
Spoke with Nunzio Cory, per Dr Volanda Napoleon, gave VO for PT.

## 2022-03-01 NOTE — Telephone Encounter (Signed)
Physical therapy 1x1/2x3/1x5 for strenthening , mobility training and patient care giver training. Confidential voicemail, message can be left

## 2022-03-04 NOTE — Telephone Encounter (Signed)
Home health calling to check on progress of these skilled nursing orders.

## 2022-03-05 ENCOUNTER — Encounter: Payer: Self-pay | Admitting: Family Medicine

## 2022-03-05 NOTE — Telephone Encounter (Signed)
Spoke with Wells Guiles, per Dr Volanda Napoleon, gave okay for VO orders for skilled nursing and HHA.

## 2022-03-06 DIAGNOSIS — E1151 Type 2 diabetes mellitus with diabetic peripheral angiopathy without gangrene: Secondary | ICD-10-CM | POA: Diagnosis not present

## 2022-03-06 DIAGNOSIS — L89312 Pressure ulcer of right buttock, stage 2: Secondary | ICD-10-CM | POA: Diagnosis not present

## 2022-03-06 DIAGNOSIS — L97921 Non-pressure chronic ulcer of unspecified part of left lower leg limited to breakdown of skin: Secondary | ICD-10-CM | POA: Diagnosis not present

## 2022-03-06 DIAGNOSIS — I872 Venous insufficiency (chronic) (peripheral): Secondary | ICD-10-CM | POA: Diagnosis not present

## 2022-03-06 DIAGNOSIS — I890 Lymphedema, not elsewhere classified: Secondary | ICD-10-CM | POA: Diagnosis not present

## 2022-03-06 DIAGNOSIS — I69354 Hemiplegia and hemiparesis following cerebral infarction affecting left non-dominant side: Secondary | ICD-10-CM | POA: Diagnosis not present

## 2022-03-07 ENCOUNTER — Telehealth: Payer: Self-pay | Admitting: Family Medicine

## 2022-03-07 NOTE — Telephone Encounter (Signed)
Discontinue home health aid. Patient refused.

## 2022-03-08 DIAGNOSIS — I872 Venous insufficiency (chronic) (peripheral): Secondary | ICD-10-CM | POA: Diagnosis not present

## 2022-03-08 DIAGNOSIS — L97921 Non-pressure chronic ulcer of unspecified part of left lower leg limited to breakdown of skin: Secondary | ICD-10-CM | POA: Diagnosis not present

## 2022-03-08 DIAGNOSIS — E1151 Type 2 diabetes mellitus with diabetic peripheral angiopathy without gangrene: Secondary | ICD-10-CM | POA: Diagnosis not present

## 2022-03-08 DIAGNOSIS — I69354 Hemiplegia and hemiparesis following cerebral infarction affecting left non-dominant side: Secondary | ICD-10-CM | POA: Diagnosis not present

## 2022-03-08 DIAGNOSIS — L89312 Pressure ulcer of right buttock, stage 2: Secondary | ICD-10-CM | POA: Diagnosis not present

## 2022-03-08 DIAGNOSIS — I890 Lymphedema, not elsewhere classified: Secondary | ICD-10-CM | POA: Diagnosis not present

## 2022-03-11 DIAGNOSIS — I872 Venous insufficiency (chronic) (peripheral): Secondary | ICD-10-CM | POA: Diagnosis not present

## 2022-03-11 DIAGNOSIS — I69354 Hemiplegia and hemiparesis following cerebral infarction affecting left non-dominant side: Secondary | ICD-10-CM | POA: Diagnosis not present

## 2022-03-11 DIAGNOSIS — I890 Lymphedema, not elsewhere classified: Secondary | ICD-10-CM | POA: Diagnosis not present

## 2022-03-11 DIAGNOSIS — L89312 Pressure ulcer of right buttock, stage 2: Secondary | ICD-10-CM | POA: Diagnosis not present

## 2022-03-11 DIAGNOSIS — L97921 Non-pressure chronic ulcer of unspecified part of left lower leg limited to breakdown of skin: Secondary | ICD-10-CM | POA: Diagnosis not present

## 2022-03-11 DIAGNOSIS — E1151 Type 2 diabetes mellitus with diabetic peripheral angiopathy without gangrene: Secondary | ICD-10-CM | POA: Diagnosis not present

## 2022-03-12 DIAGNOSIS — L97921 Non-pressure chronic ulcer of unspecified part of left lower leg limited to breakdown of skin: Secondary | ICD-10-CM | POA: Diagnosis not present

## 2022-03-12 DIAGNOSIS — E1151 Type 2 diabetes mellitus with diabetic peripheral angiopathy without gangrene: Secondary | ICD-10-CM | POA: Diagnosis not present

## 2022-03-12 DIAGNOSIS — L89312 Pressure ulcer of right buttock, stage 2: Secondary | ICD-10-CM | POA: Diagnosis not present

## 2022-03-12 DIAGNOSIS — I872 Venous insufficiency (chronic) (peripheral): Secondary | ICD-10-CM | POA: Diagnosis not present

## 2022-03-12 DIAGNOSIS — I890 Lymphedema, not elsewhere classified: Secondary | ICD-10-CM | POA: Diagnosis not present

## 2022-03-12 DIAGNOSIS — I69354 Hemiplegia and hemiparesis following cerebral infarction affecting left non-dominant side: Secondary | ICD-10-CM | POA: Diagnosis not present

## 2022-03-14 DIAGNOSIS — I890 Lymphedema, not elsewhere classified: Secondary | ICD-10-CM | POA: Diagnosis not present

## 2022-03-14 DIAGNOSIS — I872 Venous insufficiency (chronic) (peripheral): Secondary | ICD-10-CM | POA: Diagnosis not present

## 2022-03-14 DIAGNOSIS — L97921 Non-pressure chronic ulcer of unspecified part of left lower leg limited to breakdown of skin: Secondary | ICD-10-CM | POA: Diagnosis not present

## 2022-03-14 DIAGNOSIS — L89312 Pressure ulcer of right buttock, stage 2: Secondary | ICD-10-CM | POA: Diagnosis not present

## 2022-03-14 DIAGNOSIS — I69354 Hemiplegia and hemiparesis following cerebral infarction affecting left non-dominant side: Secondary | ICD-10-CM | POA: Diagnosis not present

## 2022-03-14 DIAGNOSIS — E1151 Type 2 diabetes mellitus with diabetic peripheral angiopathy without gangrene: Secondary | ICD-10-CM | POA: Diagnosis not present

## 2022-03-15 DIAGNOSIS — E1151 Type 2 diabetes mellitus with diabetic peripheral angiopathy without gangrene: Secondary | ICD-10-CM | POA: Diagnosis not present

## 2022-03-15 DIAGNOSIS — I890 Lymphedema, not elsewhere classified: Secondary | ICD-10-CM | POA: Diagnosis not present

## 2022-03-15 DIAGNOSIS — I872 Venous insufficiency (chronic) (peripheral): Secondary | ICD-10-CM | POA: Diagnosis not present

## 2022-03-15 DIAGNOSIS — L97921 Non-pressure chronic ulcer of unspecified part of left lower leg limited to breakdown of skin: Secondary | ICD-10-CM | POA: Diagnosis not present

## 2022-03-15 DIAGNOSIS — L89312 Pressure ulcer of right buttock, stage 2: Secondary | ICD-10-CM | POA: Diagnosis not present

## 2022-03-15 DIAGNOSIS — I69354 Hemiplegia and hemiparesis following cerebral infarction affecting left non-dominant side: Secondary | ICD-10-CM | POA: Diagnosis not present

## 2022-03-18 ENCOUNTER — Ambulatory Visit: Payer: Medicare Other | Admitting: Family Medicine

## 2022-03-18 ENCOUNTER — Other Ambulatory Visit: Payer: Self-pay | Admitting: Family Medicine

## 2022-03-19 ENCOUNTER — Telehealth: Payer: Self-pay | Admitting: Family Medicine

## 2022-03-19 DIAGNOSIS — E1151 Type 2 diabetes mellitus with diabetic peripheral angiopathy without gangrene: Secondary | ICD-10-CM | POA: Diagnosis not present

## 2022-03-19 DIAGNOSIS — L89312 Pressure ulcer of right buttock, stage 2: Secondary | ICD-10-CM | POA: Diagnosis not present

## 2022-03-19 DIAGNOSIS — I872 Venous insufficiency (chronic) (peripheral): Secondary | ICD-10-CM | POA: Diagnosis not present

## 2022-03-19 DIAGNOSIS — L97921 Non-pressure chronic ulcer of unspecified part of left lower leg limited to breakdown of skin: Secondary | ICD-10-CM | POA: Diagnosis not present

## 2022-03-19 DIAGNOSIS — I890 Lymphedema, not elsewhere classified: Secondary | ICD-10-CM | POA: Diagnosis not present

## 2022-03-19 DIAGNOSIS — I69354 Hemiplegia and hemiparesis following cerebral infarction affecting left non-dominant side: Secondary | ICD-10-CM | POA: Diagnosis not present

## 2022-03-19 NOTE — Telephone Encounter (Signed)
Jimmy Carr from [facility] call and stated pt refuse Home health service but will continue Physical Therapy and nursing aide service .

## 2022-03-20 NOTE — Telephone Encounter (Signed)
Ok

## 2022-03-21 DIAGNOSIS — I890 Lymphedema, not elsewhere classified: Secondary | ICD-10-CM | POA: Diagnosis not present

## 2022-03-21 DIAGNOSIS — I69354 Hemiplegia and hemiparesis following cerebral infarction affecting left non-dominant side: Secondary | ICD-10-CM | POA: Diagnosis not present

## 2022-03-21 DIAGNOSIS — E1151 Type 2 diabetes mellitus with diabetic peripheral angiopathy without gangrene: Secondary | ICD-10-CM | POA: Diagnosis not present

## 2022-03-21 DIAGNOSIS — I872 Venous insufficiency (chronic) (peripheral): Secondary | ICD-10-CM | POA: Diagnosis not present

## 2022-03-21 DIAGNOSIS — L89312 Pressure ulcer of right buttock, stage 2: Secondary | ICD-10-CM | POA: Diagnosis not present

## 2022-03-21 DIAGNOSIS — L97921 Non-pressure chronic ulcer of unspecified part of left lower leg limited to breakdown of skin: Secondary | ICD-10-CM | POA: Diagnosis not present

## 2022-03-22 DIAGNOSIS — E1151 Type 2 diabetes mellitus with diabetic peripheral angiopathy without gangrene: Secondary | ICD-10-CM | POA: Diagnosis not present

## 2022-03-22 DIAGNOSIS — L89312 Pressure ulcer of right buttock, stage 2: Secondary | ICD-10-CM | POA: Diagnosis not present

## 2022-03-22 DIAGNOSIS — L97921 Non-pressure chronic ulcer of unspecified part of left lower leg limited to breakdown of skin: Secondary | ICD-10-CM | POA: Diagnosis not present

## 2022-03-22 DIAGNOSIS — I890 Lymphedema, not elsewhere classified: Secondary | ICD-10-CM | POA: Diagnosis not present

## 2022-03-22 DIAGNOSIS — I69354 Hemiplegia and hemiparesis following cerebral infarction affecting left non-dominant side: Secondary | ICD-10-CM | POA: Diagnosis not present

## 2022-03-22 DIAGNOSIS — I872 Venous insufficiency (chronic) (peripheral): Secondary | ICD-10-CM | POA: Diagnosis not present

## 2022-03-23 ENCOUNTER — Other Ambulatory Visit: Payer: Self-pay | Admitting: Family Medicine

## 2022-03-25 ENCOUNTER — Other Ambulatory Visit: Payer: Self-pay | Admitting: Family Medicine

## 2022-03-25 DIAGNOSIS — I890 Lymphedema, not elsewhere classified: Secondary | ICD-10-CM | POA: Diagnosis not present

## 2022-03-25 DIAGNOSIS — E1151 Type 2 diabetes mellitus with diabetic peripheral angiopathy without gangrene: Secondary | ICD-10-CM | POA: Diagnosis not present

## 2022-03-25 DIAGNOSIS — I872 Venous insufficiency (chronic) (peripheral): Secondary | ICD-10-CM | POA: Diagnosis not present

## 2022-03-25 DIAGNOSIS — L89312 Pressure ulcer of right buttock, stage 2: Secondary | ICD-10-CM | POA: Diagnosis not present

## 2022-03-25 DIAGNOSIS — L97921 Non-pressure chronic ulcer of unspecified part of left lower leg limited to breakdown of skin: Secondary | ICD-10-CM | POA: Diagnosis not present

## 2022-03-25 DIAGNOSIS — I69354 Hemiplegia and hemiparesis following cerebral infarction affecting left non-dominant side: Secondary | ICD-10-CM | POA: Diagnosis not present

## 2022-03-26 DIAGNOSIS — I872 Venous insufficiency (chronic) (peripheral): Secondary | ICD-10-CM | POA: Diagnosis not present

## 2022-03-26 DIAGNOSIS — E1151 Type 2 diabetes mellitus with diabetic peripheral angiopathy without gangrene: Secondary | ICD-10-CM | POA: Diagnosis not present

## 2022-03-26 DIAGNOSIS — I890 Lymphedema, not elsewhere classified: Secondary | ICD-10-CM | POA: Diagnosis not present

## 2022-03-26 DIAGNOSIS — I69354 Hemiplegia and hemiparesis following cerebral infarction affecting left non-dominant side: Secondary | ICD-10-CM | POA: Diagnosis not present

## 2022-03-26 DIAGNOSIS — L89312 Pressure ulcer of right buttock, stage 2: Secondary | ICD-10-CM | POA: Diagnosis not present

## 2022-03-26 DIAGNOSIS — L97921 Non-pressure chronic ulcer of unspecified part of left lower leg limited to breakdown of skin: Secondary | ICD-10-CM | POA: Diagnosis not present

## 2022-03-28 ENCOUNTER — Other Ambulatory Visit: Payer: Self-pay | Admitting: Family Medicine

## 2022-03-28 DIAGNOSIS — I69354 Hemiplegia and hemiparesis following cerebral infarction affecting left non-dominant side: Secondary | ICD-10-CM | POA: Diagnosis not present

## 2022-03-28 DIAGNOSIS — Z7984 Long term (current) use of oral hypoglycemic drugs: Secondary | ICD-10-CM | POA: Diagnosis not present

## 2022-03-28 DIAGNOSIS — H409 Unspecified glaucoma: Secondary | ICD-10-CM | POA: Diagnosis not present

## 2022-03-28 DIAGNOSIS — Z9181 History of falling: Secondary | ICD-10-CM | POA: Diagnosis not present

## 2022-03-28 DIAGNOSIS — I872 Venous insufficiency (chronic) (peripheral): Secondary | ICD-10-CM | POA: Diagnosis not present

## 2022-03-28 DIAGNOSIS — E1142 Type 2 diabetes mellitus with diabetic polyneuropathy: Secondary | ICD-10-CM | POA: Diagnosis not present

## 2022-03-28 DIAGNOSIS — Z794 Long term (current) use of insulin: Secondary | ICD-10-CM | POA: Diagnosis not present

## 2022-03-28 DIAGNOSIS — I2510 Atherosclerotic heart disease of native coronary artery without angina pectoris: Secondary | ICD-10-CM | POA: Diagnosis not present

## 2022-03-28 DIAGNOSIS — L97911 Non-pressure chronic ulcer of unspecified part of right lower leg limited to breakdown of skin: Secondary | ICD-10-CM | POA: Diagnosis not present

## 2022-03-28 DIAGNOSIS — I890 Lymphedema, not elsewhere classified: Secondary | ICD-10-CM | POA: Diagnosis not present

## 2022-03-28 DIAGNOSIS — L97921 Non-pressure chronic ulcer of unspecified part of left lower leg limited to breakdown of skin: Secondary | ICD-10-CM | POA: Diagnosis not present

## 2022-03-28 DIAGNOSIS — I480 Paroxysmal atrial fibrillation: Secondary | ICD-10-CM | POA: Diagnosis not present

## 2022-03-28 DIAGNOSIS — I10 Essential (primary) hypertension: Secondary | ICD-10-CM | POA: Diagnosis not present

## 2022-03-28 DIAGNOSIS — Z7901 Long term (current) use of anticoagulants: Secondary | ICD-10-CM | POA: Diagnosis not present

## 2022-03-28 DIAGNOSIS — L89312 Pressure ulcer of right buttock, stage 2: Secondary | ICD-10-CM | POA: Diagnosis not present

## 2022-03-28 DIAGNOSIS — E1151 Type 2 diabetes mellitus with diabetic peripheral angiopathy without gangrene: Secondary | ICD-10-CM | POA: Diagnosis not present

## 2022-03-28 DIAGNOSIS — I471 Supraventricular tachycardia: Secondary | ICD-10-CM | POA: Diagnosis not present

## 2022-03-28 DIAGNOSIS — E782 Mixed hyperlipidemia: Secondary | ICD-10-CM | POA: Diagnosis not present

## 2022-04-03 DIAGNOSIS — I872 Venous insufficiency (chronic) (peripheral): Secondary | ICD-10-CM | POA: Diagnosis not present

## 2022-04-03 DIAGNOSIS — I69354 Hemiplegia and hemiparesis following cerebral infarction affecting left non-dominant side: Secondary | ICD-10-CM | POA: Diagnosis not present

## 2022-04-03 DIAGNOSIS — L97921 Non-pressure chronic ulcer of unspecified part of left lower leg limited to breakdown of skin: Secondary | ICD-10-CM | POA: Diagnosis not present

## 2022-04-03 DIAGNOSIS — E1151 Type 2 diabetes mellitus with diabetic peripheral angiopathy without gangrene: Secondary | ICD-10-CM | POA: Diagnosis not present

## 2022-04-03 DIAGNOSIS — L89312 Pressure ulcer of right buttock, stage 2: Secondary | ICD-10-CM | POA: Diagnosis not present

## 2022-04-03 DIAGNOSIS — I890 Lymphedema, not elsewhere classified: Secondary | ICD-10-CM | POA: Diagnosis not present

## 2022-04-04 DIAGNOSIS — E1151 Type 2 diabetes mellitus with diabetic peripheral angiopathy without gangrene: Secondary | ICD-10-CM | POA: Diagnosis not present

## 2022-04-04 DIAGNOSIS — L89312 Pressure ulcer of right buttock, stage 2: Secondary | ICD-10-CM | POA: Diagnosis not present

## 2022-04-04 DIAGNOSIS — I69354 Hemiplegia and hemiparesis following cerebral infarction affecting left non-dominant side: Secondary | ICD-10-CM | POA: Diagnosis not present

## 2022-04-04 DIAGNOSIS — I872 Venous insufficiency (chronic) (peripheral): Secondary | ICD-10-CM | POA: Diagnosis not present

## 2022-04-04 DIAGNOSIS — L97921 Non-pressure chronic ulcer of unspecified part of left lower leg limited to breakdown of skin: Secondary | ICD-10-CM | POA: Diagnosis not present

## 2022-04-04 DIAGNOSIS — I890 Lymphedema, not elsewhere classified: Secondary | ICD-10-CM | POA: Diagnosis not present

## 2022-04-10 DIAGNOSIS — L97921 Non-pressure chronic ulcer of unspecified part of left lower leg limited to breakdown of skin: Secondary | ICD-10-CM | POA: Diagnosis not present

## 2022-04-10 DIAGNOSIS — I69354 Hemiplegia and hemiparesis following cerebral infarction affecting left non-dominant side: Secondary | ICD-10-CM | POA: Diagnosis not present

## 2022-04-10 DIAGNOSIS — E1151 Type 2 diabetes mellitus with diabetic peripheral angiopathy without gangrene: Secondary | ICD-10-CM | POA: Diagnosis not present

## 2022-04-10 DIAGNOSIS — L89312 Pressure ulcer of right buttock, stage 2: Secondary | ICD-10-CM | POA: Diagnosis not present

## 2022-04-10 DIAGNOSIS — I872 Venous insufficiency (chronic) (peripheral): Secondary | ICD-10-CM | POA: Diagnosis not present

## 2022-04-10 DIAGNOSIS — I890 Lymphedema, not elsewhere classified: Secondary | ICD-10-CM | POA: Diagnosis not present

## 2022-04-12 ENCOUNTER — Other Ambulatory Visit: Payer: Self-pay | Admitting: Student

## 2022-04-12 ENCOUNTER — Other Ambulatory Visit: Payer: Self-pay | Admitting: Family Medicine

## 2022-04-12 DIAGNOSIS — I10 Essential (primary) hypertension: Secondary | ICD-10-CM

## 2022-04-17 DIAGNOSIS — I872 Venous insufficiency (chronic) (peripheral): Secondary | ICD-10-CM | POA: Diagnosis not present

## 2022-04-17 DIAGNOSIS — L89312 Pressure ulcer of right buttock, stage 2: Secondary | ICD-10-CM | POA: Diagnosis not present

## 2022-04-17 DIAGNOSIS — L97921 Non-pressure chronic ulcer of unspecified part of left lower leg limited to breakdown of skin: Secondary | ICD-10-CM | POA: Diagnosis not present

## 2022-04-17 DIAGNOSIS — I69354 Hemiplegia and hemiparesis following cerebral infarction affecting left non-dominant side: Secondary | ICD-10-CM | POA: Diagnosis not present

## 2022-04-17 DIAGNOSIS — E1151 Type 2 diabetes mellitus with diabetic peripheral angiopathy without gangrene: Secondary | ICD-10-CM | POA: Diagnosis not present

## 2022-04-17 DIAGNOSIS — I890 Lymphedema, not elsewhere classified: Secondary | ICD-10-CM | POA: Diagnosis not present

## 2022-04-18 DIAGNOSIS — I69354 Hemiplegia and hemiparesis following cerebral infarction affecting left non-dominant side: Secondary | ICD-10-CM | POA: Diagnosis not present

## 2022-04-18 DIAGNOSIS — I872 Venous insufficiency (chronic) (peripheral): Secondary | ICD-10-CM | POA: Diagnosis not present

## 2022-04-18 DIAGNOSIS — L97921 Non-pressure chronic ulcer of unspecified part of left lower leg limited to breakdown of skin: Secondary | ICD-10-CM | POA: Diagnosis not present

## 2022-04-18 DIAGNOSIS — E1151 Type 2 diabetes mellitus with diabetic peripheral angiopathy without gangrene: Secondary | ICD-10-CM | POA: Diagnosis not present

## 2022-04-18 DIAGNOSIS — I890 Lymphedema, not elsewhere classified: Secondary | ICD-10-CM | POA: Diagnosis not present

## 2022-04-18 DIAGNOSIS — L89312 Pressure ulcer of right buttock, stage 2: Secondary | ICD-10-CM | POA: Diagnosis not present

## 2022-04-23 ENCOUNTER — Telehealth: Payer: Self-pay | Admitting: Family Medicine

## 2022-04-23 DIAGNOSIS — I872 Venous insufficiency (chronic) (peripheral): Secondary | ICD-10-CM | POA: Diagnosis not present

## 2022-04-23 DIAGNOSIS — I69354 Hemiplegia and hemiparesis following cerebral infarction affecting left non-dominant side: Secondary | ICD-10-CM | POA: Diagnosis not present

## 2022-04-23 DIAGNOSIS — E1151 Type 2 diabetes mellitus with diabetic peripheral angiopathy without gangrene: Secondary | ICD-10-CM | POA: Diagnosis not present

## 2022-04-23 DIAGNOSIS — L89312 Pressure ulcer of right buttock, stage 2: Secondary | ICD-10-CM | POA: Diagnosis not present

## 2022-04-23 DIAGNOSIS — L97921 Non-pressure chronic ulcer of unspecified part of left lower leg limited to breakdown of skin: Secondary | ICD-10-CM | POA: Diagnosis not present

## 2022-04-23 DIAGNOSIS — I890 Lymphedema, not elsewhere classified: Secondary | ICD-10-CM | POA: Diagnosis not present

## 2022-04-23 NOTE — Telephone Encounter (Signed)
Frequency home health PT 1x6 effective 04/27/22 ongoing strengthening , endurance and mobility training

## 2022-04-25 ENCOUNTER — Ambulatory Visit (INDEPENDENT_AMBULATORY_CARE_PROVIDER_SITE_OTHER): Payer: Medicare Other

## 2022-04-25 VITALS — Ht 72.0 in | Wt 250.0 lb

## 2022-04-25 DIAGNOSIS — L89312 Pressure ulcer of right buttock, stage 2: Secondary | ICD-10-CM | POA: Diagnosis not present

## 2022-04-25 DIAGNOSIS — I890 Lymphedema, not elsewhere classified: Secondary | ICD-10-CM | POA: Diagnosis not present

## 2022-04-25 DIAGNOSIS — Z0000 Encounter for general adult medical examination without abnormal findings: Secondary | ICD-10-CM

## 2022-04-25 DIAGNOSIS — I69354 Hemiplegia and hemiparesis following cerebral infarction affecting left non-dominant side: Secondary | ICD-10-CM | POA: Diagnosis not present

## 2022-04-25 DIAGNOSIS — I872 Venous insufficiency (chronic) (peripheral): Secondary | ICD-10-CM | POA: Diagnosis not present

## 2022-04-25 DIAGNOSIS — L97921 Non-pressure chronic ulcer of unspecified part of left lower leg limited to breakdown of skin: Secondary | ICD-10-CM | POA: Diagnosis not present

## 2022-04-25 DIAGNOSIS — E1151 Type 2 diabetes mellitus with diabetic peripheral angiopathy without gangrene: Secondary | ICD-10-CM | POA: Diagnosis not present

## 2022-04-25 NOTE — Telephone Encounter (Signed)
Okay 

## 2022-04-25 NOTE — Progress Notes (Signed)
Subjective:   STYLIANOS STRADLING is a 81 y.o. male who presents for Medicare Annual/Subsequent preventive examination.  Review of Systems    Virtual Visit via Telephone Note  I connected with  Tressia Danas on 04/25/22 at  1:00 PM EDT by telephone and verified that I am speaking with the correct person using two identifiers.  Location: Patient: Home Provider: Office Persons participating in the virtual visit: patient/Nurse Health Advisor   I discussed the limitations, risks, security and privacy concerns of performing an evaluation and management service by telephone and the availability of in person appointments. The patient expressed understanding and agreed to proceed.  Interactive audio and video telecommunications were attempted between this nurse and patient, however failed, due to patient having technical difficulties OR patient did not have access to video capability.  We continued and completed visit with audio only.  Some vital signs may be absent or patient reported.   Criselda Peaches, LPN  Cardiac Risk Factors include: advanced age (>74mn, >>81women);diabetes mellitus;hypertension;male gender;sedentary lifestyle     Objective:    Today's Vitals   04/25/22 1255  Weight: 250 lb (113.4 kg)  Height: 6' (1.829 m)   Body mass index is 33.91 kg/m.     04/25/2022    1:10 PM 10/18/2021    1:27 PM  Advanced Directives  Does Patient Have a Medical Advance Directive? Yes Yes  [place]   Does patient want to make changes to medical advance directive? No - Patient declined No - Patient declined  Copy of [place] Chart? No - copy requested     Current Medications (verified) Outpatient Encounter Medications as of 04/25/2022  Medication Sig   B-D ULTRAFINE III SHORT PEN 31G X 8 MM MISC USE FOUR TIMES DAILY BEFORE MEALS AND AT BEDTIME   Blood Glucose Monitoring Suppl (ACCU-CHEK AVIVA PLUS)  w/Device KIT USED TO CHECK BLOOD SUGAR 3 TIMES DAILY OR PRN.   carvedilol (COREG) 3.125 MG tablet Take 1 tablet (3.125 mg total) by mouth 2 (two) times daily.   furosemide (LASIX) 20 MG tablet TAKE 1 TABLET(20 MG) BY MOUTH DAILY   gabapentin (NEURONTIN) 100 MG capsule TAKE 1 CAPSULE(100 MG) BY MOUTH AT BEDTIME   glucose blood (PRODIGY NO CODING BLOOD GLUC) test strip USE AS DIRECTED THREE TIMES DAILY   HUMALOG KWIKPEN 100 UNIT/ML KwikPen INJECT 50 UNITS UNDER THE SKIN AT BREAKFAST, 35 UNITS WITH LUNCH AND 50 UNITS WITH SUPPER   hydrochlorothiazide (HYDRODIURIL) 25 MG tablet Take 1 tablet (25 mg total) by mouth daily.   LANTUS SOLOSTAR 100 UNIT/ML Solostar Pen ADMINISTER 60 UNITS UNDER THE SKIN AT 10 PM   latanoprost (XALATAN) 0.005 % ophthalmic solution 1 drop daily.    lisinopril (ZESTRIL) 40 MG tablet TAKE 1 TABLET(40 MG) BY MOUTH DAILY   lovastatin (MEVACOR) 40 MG tablet TAKE 1 TABLET(40 MG) BY MOUTH AT BEDTIME   metFORMIN (GLUCOPHAGE) 1000 MG tablet TAKE 1 TABLET(1000 MG) BY MOUTH TWICE DAILY WITH A MEAL   methocarbamol (ROBAXIN) 500 MG tablet TAKE 1 TABLET(500 MG) BY MOUTH FOUR TIMES DAILY   NYSTATIN powder APPLY TOPICALLY THREE TIMES DAILY   potassium chloride SA (KLOR-CON M20) 20 MEQ tablet Take 1 tablet by mouth as needed when Lasix is taken   Prodigy Twist Top Lancets 28G MISC TEST BLOOD GLUCOSE 3 TIMES DAILY AS DIRECTED   rOPINIRole (REQUIP) 1 MG tablet TAKE 1 TABLET BY MOUTH THREE TIMES  DAILY   timolol (TIMOPTIC-XR) 0.5 % ophthalmic gel-forming Place 1 drop into both eyes daily.    XARELTO 20 MG TABS tablet TAKE 1 TABLET BY MOUTH EVERY EVENING   Facility-Administered Encounter Medications as of 04/25/2022  Medication   0.9 %  sodium chloride infusion    Allergies (verified) Cheratussin ac [guaifenesin-codeine] and Verapamil   History: Past Medical History:  Diagnosis Date   AF (atrial fibrillation) (HCC)    2D ECHO, 03/01/1987 - normal; NUCLEAR STRESS TEST, 04/10/2006 - EF 65%,  no ischemia   Bruit    CAROTID DOPPLER, 09/20/2008 - normal, no evidence of diameter reduction, significant tortuousity or any other vascular abnormality   Cerebrovascular accident [facility]) 2003   history of small vessal lacunar stroke 2003   Diabetes mellitus type II    Glaucoma    Hyperlipidemia    Hypertension    Other specified cardiac dysrhythmias(427.89)    PSVT   Peripheral neuropathy    Past Surgical History:  Procedure Laterality Date   ANAL FISSURE REPAIR     CARDIAC CATHETERIZATION  02/02/2001   Normal LV systolic function, questionable mitral valve prolapse without mitral regurgitation, no evidence of CAD   CARDIAC CATHETERIZATION  09/13/2008   Minimal CAD, normal LV systolic function, no evidence of renal artery stenosis, distal aortic disease, or iliac disease   Family History  Problem Relation Age of Onset   Diabetes Neg Hx    Colon cancer Neg Hx    Social History   Socioeconomic History   Marital status: Married    Spouse name: Not on file   Number of children: Not on file   Years of education: Not on file   Highest education level: Not on file  Occupational History   Not on file  Tobacco Use   Smoking status: Never   Smokeless tobacco: Never  Vaping Use   Vaping Use: Never used  Substance and Sexual Activity   Alcohol use: No   Drug use: No   Sexual activity: Not on file  Other Topics Concern   Not on file  Social History Narrative   Non smoker    On disability   Social Determinants of Health   Financial Resource Strain: Low Risk  (04/25/2022)   Overall Financial Resource Strain (CARDIA)    Difficulty of Paying Living Expenses: Not hard at all  Food Insecurity: No Food Insecurity (04/25/2022)   Hunger Vital Sign    Worried About Running Out of Food in the Last Year: Never true    Ran Out of Food in the Last Year: Never true  Transportation Needs: No Transportation Needs (04/25/2022)   PRAPARE - Hydrologist (Medical):  No    Lack of Transportation (Non-Medical): No  Physical Activity: Sufficiently Active (04/25/2022)   Exercise Vital Sign    Days of Exercise per Week: 7 days    Minutes of Exercise per Session: 50 min  Stress: No Stress Concern Present (04/25/2022)   [place]    Feeling of Stress : Not at all  Social Connections: Moderately Isolated (04/25/2022)   Social Connection and Isolation Panel [NHANES]    Frequency of Communication with Friends and Family: More than three times a week    Frequency of Social Gatherings with Friends and Family: More than three times a week    Attends Religious Services: Never    Marine scientist or Organizations: No  Attends Archivist Meetings: Never    Marital Status: Married    Tobacco Counseling Counseling given: Not Answered   Clinical Intake: Nutrition Risk Assessment:  Has the patient had any N/V/D within the last 2 months?  No  Does the patient have any non-healing wounds?  No  Has the patient had any unintentional weight loss or weight gain?  No   Diabetes:  Is the patient diabetic?  Yes  If diabetic, was a CBG obtained today?  Yes CBG 110 Taken by patient Did the patient bring in their glucometer from home?  No  How often do you monitor your CBG's? 3x daily.   Financial Strains and Diabetes Management:  Are you having any financial strains with the device, your supplies or your medication? No .  Does the patient want to be seen by Chronic Care Management for management of their diabetes?  No  Would the patient like to be referred to a Nutritionist or for Diabetic Management?  No   Diabetic Exams:  Diabetic Eye Exam: Completed No. Overdue for diabetic eye exam. Pt has been advised about the importance in completing this exam. A referral has been placed today. Message sent to referral coordinator for scheduling purposes. Advised pt to expect a call from  office referred to regarding appt.  Diabetic Foot Exam: Completed No. Pt has been advised about the importance in completing this exam. Pt is scheduled for diabetic foot exam on Followed by PCP Pre-visit preparation completed: No  Diabetic?  Yes  Interpreter Needed?: No Activities of Daily Living    04/25/2022    1:04 PM  In your present state of health, do you have any difficulty performing the following activities:  Hearing? 0  Vision? 0  Difficulty concentrating or making decisions? 0  Walking or climbing stairs? 1  Comment Uses w/c and walker  Dressing or bathing? 1  Comment Family Assist  Doing errands, shopping? [place] and eating ? Y  Comment Family Assist  Using the Toilet? Y  Comment Family Assist  In the past six months, have you accidently leaked urine? N  Do you have problems with loss of bowel control? N  Managing your Medications? N  Managing your Finances? N  Housekeeping or managing your Housekeeping? N    Patient Care Team: [name], MD as PCP - General (Family Medicine) Debara Pickett Nadean Corwin, MD as PCP - Cardiology (Cardiology) Harriett Sine, MD as Consulting Physician (Dermatology)  Indicate any recent Medical Services you may have received from other than [facility] providers in the past year (date may be approximate).     Assessment:   This is a routine wellness examination for [place].  Hearing/Vision screen Hearing Screening - Comments:: No hearing difficulty Vision Screening - Comments:: Wears glasses. Followed by Dr Edilia Bo  Dietary issues and exercise activities discussed: Exercise limited by: orthopedic condition(s)   Goals Addressed               This Visit's Progress     Patient stated (pt-stated)        I would like to stand and walk better       Depression Screen    04/25/2022    1:02 PM 01/09/2022    2:46 PM 11/30/2021   10:58 AM 10/31/2021    2:03 PM 03/02/2020    8:13 AM 12/31/2017   10:45  AM 05/16/2015    2:00 PM  PHQ 2/9 Scores  PHQ -  2 Score 0 0 1 0 0 0 0  PHQ- 9 Score  3 4        Fall Risk    04/25/2022    1:09 PM 01/09/2022    2:46 PM 11/30/2021   10:57 AM 08/17/2021   11:17 AM 03/02/2020    8:13 AM  Fall Risk   Falls in the past year? 0 0 0 1 0  Number falls in past yr: 0 0  0   Injury with Fall? 0 0 0 1   Risk for fall due to : No Fall Risks No Fall Risks     Follow up  Falls evaluation completed       FALL RISK PREVENTION PERTAINING TO THE HOME:  Any stairs in or around the home? Yes  If so, are there any without handrails? Yes  Home free of loose throw rugs in walkways, pet beds, electrical cords, etc? Yes  Adequate lighting in your home to reduce risk of falls? Yes   ASSISTIVE DEVICES UTILIZED TO PREVENT FALLS:  Life alert? No  Use of a cane, walker or w/c? Yes  Grab bars in the bathroom? Yes  Shower chair or bench in shower? No  Elevated toilet seat or a handicapped toilet? No   TIMED UP AND GO:  Was the test performed? No . Audio Visit   Cognitive Function:        04/25/2022    1:11 PM  6CIT Screen  What Year? 0 points  What month? 0 points  What time? 0 points  Count back from 20 0 points  Months in reverse 0 points  Repeat phrase 0 points  Total Score 0 points    Immunizations Immunization History  Administered Date(s) Administered   PFIZER Comirnaty(Gray Top)Covid-19 Tri-Sucrose Vaccine 11/30/2019, 12/29/2019   Pfizer Covid-19 Vaccine Bivalent Booster 68yr & up 07/06/2021     Flu Vaccine status: Declined, Education has been provided regarding the importance of this vaccine but patient still declined. Advised may receive this vaccine at local pharmacy or Health Dept. Aware to provide a copy of the vaccination record if obtained from local pharmacy or Health Dept. Verbalized acceptance and understanding.  Pneumococcal vaccine status: Declined,  Education has been provided regarding the importance of this vaccine but patient  still declined. Advised may receive this vaccine at local pharmacy or Health Dept. Aware to provide a copy of the vaccination record if obtained from local pharmacy or Health Dept. Verbalized acceptance and understanding.   Covid-19 vaccine status: Completed vaccines  Qualifies for Shingles Vaccine? Yes   Zostavax completed No   Shingrix Completed?: No.    Education has been provided regarding the importance of this vaccine. Patient has been advised to call insurance company to determine out of pocket expense if they have not yet received this vaccine. Advised may also receive vaccine at local pharmacy or Health Dept. Verbalized acceptance and understanding.  Screening Tests Health Maintenance  Topic Date Due   OPHTHALMOLOGY EXAM  05/02/2016   HEMOGLOBIN A1C  11/05/2021   COVID-19 Vaccine (4 - Booster for Pfizer series) 05/11/2022 (Originally 08/31/2021)   Zoster Vaccines- Shingrix (1 of 2) 07/26/2022 (Originally 03/12/1960)   Pneumonia Vaccine 79 Years old (1 - PCV) 04/26/2023 (Originally 03/12/2008)   COLONOSCOPY (Pts 45-483yrInsurance coverage will need to be confirmed)  04/26/2023 (Originally 12/04/2021)   TETANUS/TDAP  05/24/2025 (Originally 03/12/1960)   INFLUENZA VACCINE  04/30/2022   FOOT EXAM  09/12/2022   Hepatitis C Screening  Completed  HPV VACCINES  Aged Out    Health Maintenance  Health Maintenance Due  Topic Date Due   OPHTHALMOLOGY EXAM  05/02/2016   HEMOGLOBIN A1C  11/05/2021    Colorectal cancer screening: Referral to GI placed Patient deferred. Pt aware the office will call re: appt.  Lung Cancer Screening: (Low Dose CT Chest recommended if Age 28-80 years, 30 pack-year currently smoking OR have quit w/in 15years.) does not qualify.     Additional Screening:  Hepatitis C Screening: does qualify; Completed 12/31/17  Vision Screening: Recommended annual ophthalmology exams for early detection of glaucoma and other disorders of the eye. Is the patient up to date  with their annual eye exam?  Yes  Who is the provider or what is the name of the office in which the patient attends annual eye exams? Dr Edilia Bo If pt is not established with a provider, would they like to be referred to a provider to establish care? No .   Dental Screening: Recommended annual dental exams for proper oral hygiene  Community Resource Referral / Chronic Care Management:  CRR required this visit?  No   CCM required this visit?  No      Plan:     I have personally reviewed and noted the following in the patient's chart:   Medical and social history Use of alcohol, tobacco or illicit drugs  Current medications and supplements including opioid prescriptions. Patient is not currently taking opioid prescriptions. Functional ability and status Nutritional status Physical activity Advanced directives List of other physicians Hospitalizations, surgeries, and ER visits in previous 12 months Vitals Screenings to include cognitive, depression, and falls Referrals and appointments  In addition, I have reviewed and discussed with patient certain preventive protocols, quality metrics, and best practice recommendations. A written personalized care plan for preventive services as well as general preventive health recommendations were provided to patient.     Criselda Peaches, LPN   9/77/[id number]   Nurse Notes: Patient due Hemoglobin A1C

## 2022-04-25 NOTE — Patient Instructions (Addendum)
Jimmy Carr , Thank you for taking time to come for your Medicare Wellness Visit. I appreciate your ongoing commitment to your health goals. Please review the following plan we discussed and let me know if I can assist you in the future.   These are the goals we discussed:  Goals       Patient stated (pt-stated)      I would like to stand and walk better        This is a list of the screening recommended for you and due dates:  Health Maintenance  Topic Date Due   Eye exam for diabetics  05/02/2016   Hemoglobin A1C  11/05/2021   COVID-19 Vaccine (4 - Booster for Pfizer series) 05/11/2022*   Zoster (Shingles) Vaccine (1 of 2) 07/26/2022*   Pneumonia Vaccine (1 - PCV) 04/26/2023*   Colon Cancer Screening  04/26/2023*   Tetanus Vaccine  05/24/2025*   Flu Shot  04/30/2022   Complete foot exam   09/12/2022   Hepatitis C Screening: USPSTF Recommendation to screen - Ages 18-79 yo.  Completed   HPV Vaccine  Aged Out  *Topic was postponed. The date shown is not the original due date.   Advanced directives: Yes   Conditions/risks identified: None  Next appointment: Follow up in one year for your annual wellness visit.   Preventive Care 2 Years and Older, Male Preventive care refers to lifestyle choices and visits with your health care provider that can promote health and wellness. What does preventive care include? A yearly physical exam. This is also called an annual well check. Dental exams once or twice a year. Routine eye exams. Ask your health care provider how often you should have your eyes checked. Personal lifestyle choices, including: Daily care of your teeth and gums. Regular physical activity. Eating a healthy diet. Avoiding tobacco and drug use. Limiting alcohol use. Practicing safe sex. Taking low doses of aspirin every day. Taking vitamin and mineral supplements as recommended by your health care provider. What happens during an annual well check? The services  and screenings done by your health care provider during your annual well check will depend on your age, overall health, lifestyle risk factors, and family history of disease. Counseling  Your health care provider may ask you questions about your: Alcohol use. Tobacco use. Drug use. Emotional well-being. Home and relationship well-being. Sexual activity. Eating habits. History of falls. Memory and ability to understand (cognition). Work and work Statistician. Screening  You may have the following tests or measurements: Height, weight, and BMI. Blood pressure. Lipid and cholesterol levels. These may be checked every 5 years, or more frequently if you are over 63 years old. Skin check. Lung cancer screening. You may have this screening every year starting at age 7 if you have a 30-pack-year history of smoking and currently smoke or have quit within the past 15 years. Fecal occult blood test (FOBT) of the stool. You may have this test every year starting at age 68. Flexible sigmoidoscopy or colonoscopy. You may have a sigmoidoscopy every 5 years or a colonoscopy every 10 years starting at age 48. Prostate cancer screening. Recommendations will vary depending on your family history and other risks. Hepatitis C blood test. Hepatitis B blood test. Sexually transmitted disease (STD) testing. Diabetes screening. This is done by checking your blood sugar (glucose) after you have not eaten for a while (fasting). You may have this done every 1-3 years. Abdominal aortic aneurysm (AAA) screening. You may need  this if you are a current or former smoker. Osteoporosis. You may be screened starting at age 78 if you are at high risk. Talk with your health care provider about your test results, treatment options, and if necessary, the need for more tests. Vaccines  Your health care provider may recommend certain vaccines, such as: Influenza vaccine. This is recommended every year. Tetanus, diphtheria,  and acellular pertussis (Tdap, Td) vaccine. You may need a Td booster every 10 years. Zoster vaccine. You may need this after age 86. Pneumococcal 13-valent conjugate (PCV13) vaccine. One dose is recommended after age 33. Pneumococcal polysaccharide (PPSV23) vaccine. One dose is recommended after age 58. Talk to your health care provider about which screenings and vaccines you need and how often you need them. This information is not intended to replace advice given to you by your health care provider. Make sure you discuss any questions you have with your health care provider. Document Released: 10/13/2015 Document Revised: 06/05/2016 Document Reviewed: 07/18/2015 Elsevier Interactive Patient Education  2017 [place] Prevention in the Home Falls can cause injuries. They can happen to people of all ages. There are many things you can do to make your home safe and to help prevent falls. What can I do on the outside of my home? Regularly fix the edges of walkways and driveways and fix any cracks. Remove anything that might make you trip as you walk through a door, such as a raised step or threshold. Trim any bushes or trees on the path to your home. Use bright outdoor lighting. Clear any walking paths of anything that might make someone trip, such as rocks or tools. Regularly check to see if handrails are loose or broken. Make sure that both sides of any steps have handrails. Any raised decks and porches should have guardrails on the edges. Have any leaves, snow, or ice cleared regularly. Use sand or salt on walking paths during winter. Clean up any spills in your garage right away. This includes oil or grease spills. What can I do in the bathroom? Use night lights. Install grab bars by the toilet and in the tub and shower. Do not use towel bars as grab bars. Use non-skid mats or decals in the tub or shower. If you need to sit down in the shower, use a plastic, non-slip  stool. Keep the floor dry. Clean up any water that spills on the floor as soon as it happens. Remove soap buildup in the tub or shower regularly. Attach bath mats securely with double-sided non-slip rug tape. Do not have throw rugs and other things on the floor that can make you trip. What can I do in the bedroom? Use night lights. Make sure that you have a light by your bed that is easy to reach. Do not use any sheets or blankets that are too big for your bed. They should not hang down onto the floor. Have a firm chair that has side arms. You can use this for support while you get dressed. Do not have throw rugs and other things on the floor that can make you trip. What can I do in the kitchen? Clean up any spills right away. Avoid walking on wet floors. Keep items that you use a lot in easy-to-reach places. If you need to reach something above you, use a strong step stool that has a grab bar. Keep electrical cords out of the way. Do not use floor polish or wax that makes floors  slippery. If you must use wax, use non-skid floor wax. Do not have throw rugs and other things on the floor that can make you trip. What can I do with my stairs? Do not leave any items on the stairs. Make sure that there are handrails on both sides of the stairs and use them. Fix handrails that are broken or loose. Make sure that handrails are as long as the stairways. Check any carpeting to make sure that it is firmly attached to the stairs. Fix any carpet that is loose or worn. Avoid having throw rugs at the top or bottom of the stairs. If you do have throw rugs, attach them to the floor with carpet tape. Make sure that you have a light switch at the top of the stairs and the bottom of the stairs. If you do not have them, ask someone to add them for you. What else can I do to help prevent falls? Wear shoes that: Do not have high heels. Have rubber bottoms. Are comfortable and fit you well. Are closed at the  toe. Do not wear sandals. If you use a stepladder: Make sure that it is fully opened. Do not climb a closed stepladder. Make sure that both sides of the stepladder are locked into place. Ask someone to hold it for you, if possible. Clearly mark and make sure that you can see: Any grab bars or handrails. First and last steps. Where the edge of each step is. Use tools that help you move around (mobility aids) if they are needed. These include: Canes. Walkers. Scooters. Crutches. Turn on the lights when you go into a dark area. Replace any light bulbs as soon as they burn out. Set up your furniture so you have a clear path. Avoid moving your furniture around. If any of your floors are uneven, fix them. If there are any pets around you, be aware of where they are. Review your medicines with your doctor. Some medicines can make you feel dizzy. This can increase your chance of falling. Ask your doctor what other things that you can do to help prevent falls. This information is not intended to replace advice given to you by your health care provider. Make sure you discuss any questions you have with your health care provider. Document Released: 07/13/2009 Document Revised: 02/22/2016 Document Reviewed: 10/21/2014 Elsevier Interactive Patient Education  2017 [employer].

## 2022-04-25 NOTE — Telephone Encounter (Signed)
Jimmy Carr with [facility] is calling checking on the status of VO

## 2022-04-26 NOTE — Telephone Encounter (Signed)
Spoke to [place] and give her " OK" per Dr. Volanda Napoleon

## 2022-04-27 DIAGNOSIS — Z7901 Long term (current) use of anticoagulants: Secondary | ICD-10-CM | POA: Diagnosis not present

## 2022-04-27 DIAGNOSIS — E1151 Type 2 diabetes mellitus with diabetic peripheral angiopathy without gangrene: Secondary | ICD-10-CM | POA: Diagnosis not present

## 2022-04-27 DIAGNOSIS — I69354 Hemiplegia and hemiparesis following cerebral infarction affecting left non-dominant side: Secondary | ICD-10-CM | POA: Diagnosis not present

## 2022-04-27 DIAGNOSIS — I480 Paroxysmal atrial fibrillation: Secondary | ICD-10-CM | POA: Diagnosis not present

## 2022-04-27 DIAGNOSIS — I872 Venous insufficiency (chronic) (peripheral): Secondary | ICD-10-CM | POA: Diagnosis not present

## 2022-04-27 DIAGNOSIS — I890 Lymphedema, not elsewhere classified: Secondary | ICD-10-CM | POA: Diagnosis not present

## 2022-04-27 DIAGNOSIS — Z7984 Long term (current) use of oral hypoglycemic drugs: Secondary | ICD-10-CM | POA: Diagnosis not present

## 2022-04-27 DIAGNOSIS — Z794 Long term (current) use of insulin: Secondary | ICD-10-CM | POA: Diagnosis not present

## 2022-04-27 DIAGNOSIS — I2510 Atherosclerotic heart disease of native coronary artery without angina pectoris: Secondary | ICD-10-CM | POA: Diagnosis not present

## 2022-04-27 DIAGNOSIS — E1142 Type 2 diabetes mellitus with diabetic polyneuropathy: Secondary | ICD-10-CM | POA: Diagnosis not present

## 2022-04-27 DIAGNOSIS — E782 Mixed hyperlipidemia: Secondary | ICD-10-CM | POA: Diagnosis not present

## 2022-04-27 DIAGNOSIS — I10 Essential (primary) hypertension: Secondary | ICD-10-CM | POA: Diagnosis not present

## 2022-04-27 DIAGNOSIS — H409 Unspecified glaucoma: Secondary | ICD-10-CM | POA: Diagnosis not present

## 2022-04-27 DIAGNOSIS — I471 Supraventricular tachycardia: Secondary | ICD-10-CM | POA: Diagnosis not present

## 2022-04-27 DIAGNOSIS — Z9181 History of falling: Secondary | ICD-10-CM | POA: Diagnosis not present

## 2022-04-29 ENCOUNTER — Telehealth: Payer: Self-pay | Admitting: Family Medicine

## 2022-04-29 NOTE — Telephone Encounter (Signed)
Verbal orders needed-  Wound care skilled nursing 1 week x 8  Okay to leave a voice mail message.

## 2022-04-30 NOTE — Telephone Encounter (Signed)
Calling back to check progress of this verbal order, stating they cannot go forward without approval.

## 2022-05-01 ENCOUNTER — Telehealth: Payer: Self-pay | Admitting: Family Medicine

## 2022-05-01 NOTE — Telephone Encounter (Signed)
Verbal orders needed-   Wound care skilled nursing 1 week x 8   Okay to leave a voice mail message.

## 2022-05-02 NOTE — Telephone Encounter (Signed)
Left detail message "OK" for verbal order per Dr. Volanda Napoleon for the patient.

## 2022-05-02 NOTE — Telephone Encounter (Signed)
Ok for VO. 

## 2022-05-02 NOTE — Telephone Encounter (Signed)
Ok

## 2022-05-06 DIAGNOSIS — H401133 Primary open-angle glaucoma, bilateral, severe stage: Secondary | ICD-10-CM | POA: Diagnosis not present

## 2022-05-08 ENCOUNTER — Other Ambulatory Visit: Payer: Self-pay | Admitting: Family Medicine

## 2022-05-08 DIAGNOSIS — E1151 Type 2 diabetes mellitus with diabetic peripheral angiopathy without gangrene: Secondary | ICD-10-CM | POA: Diagnosis not present

## 2022-05-08 DIAGNOSIS — I890 Lymphedema, not elsewhere classified: Secondary | ICD-10-CM | POA: Diagnosis not present

## 2022-05-08 DIAGNOSIS — I872 Venous insufficiency (chronic) (peripheral): Secondary | ICD-10-CM | POA: Diagnosis not present

## 2022-05-08 DIAGNOSIS — I480 Paroxysmal atrial fibrillation: Secondary | ICD-10-CM | POA: Diagnosis not present

## 2022-05-08 DIAGNOSIS — I10 Essential (primary) hypertension: Secondary | ICD-10-CM | POA: Diagnosis not present

## 2022-05-08 DIAGNOSIS — I69354 Hemiplegia and hemiparesis following cerebral infarction affecting left non-dominant side: Secondary | ICD-10-CM | POA: Diagnosis not present

## 2022-05-09 NOTE — Telephone Encounter (Signed)
Spoke to [facility]. Inform her "ok" for verbal order.

## 2022-05-15 DIAGNOSIS — I872 Venous insufficiency (chronic) (peripheral): Secondary | ICD-10-CM | POA: Diagnosis not present

## 2022-05-15 DIAGNOSIS — E1151 Type 2 diabetes mellitus with diabetic peripheral angiopathy without gangrene: Secondary | ICD-10-CM | POA: Diagnosis not present

## 2022-05-15 DIAGNOSIS — I890 Lymphedema, not elsewhere classified: Secondary | ICD-10-CM | POA: Diagnosis not present

## 2022-05-15 DIAGNOSIS — I69354 Hemiplegia and hemiparesis following cerebral infarction affecting left non-dominant side: Secondary | ICD-10-CM | POA: Diagnosis not present

## 2022-05-15 DIAGNOSIS — I480 Paroxysmal atrial fibrillation: Secondary | ICD-10-CM | POA: Diagnosis not present

## 2022-05-15 DIAGNOSIS — I10 Essential (primary) hypertension: Secondary | ICD-10-CM | POA: Diagnosis not present

## 2022-05-19 ENCOUNTER — Other Ambulatory Visit: Payer: Self-pay | Admitting: Student

## 2022-05-21 DIAGNOSIS — I480 Paroxysmal atrial fibrillation: Secondary | ICD-10-CM | POA: Diagnosis not present

## 2022-05-21 DIAGNOSIS — I10 Essential (primary) hypertension: Secondary | ICD-10-CM | POA: Diagnosis not present

## 2022-05-21 DIAGNOSIS — I69354 Hemiplegia and hemiparesis following cerebral infarction affecting left non-dominant side: Secondary | ICD-10-CM | POA: Diagnosis not present

## 2022-05-21 DIAGNOSIS — I872 Venous insufficiency (chronic) (peripheral): Secondary | ICD-10-CM | POA: Diagnosis not present

## 2022-05-21 DIAGNOSIS — I890 Lymphedema, not elsewhere classified: Secondary | ICD-10-CM | POA: Diagnosis not present

## 2022-05-21 DIAGNOSIS — E1151 Type 2 diabetes mellitus with diabetic peripheral angiopathy without gangrene: Secondary | ICD-10-CM | POA: Diagnosis not present

## 2022-05-22 DIAGNOSIS — E1151 Type 2 diabetes mellitus with diabetic peripheral angiopathy without gangrene: Secondary | ICD-10-CM | POA: Diagnosis not present

## 2022-05-22 DIAGNOSIS — I872 Venous insufficiency (chronic) (peripheral): Secondary | ICD-10-CM | POA: Diagnosis not present

## 2022-05-22 DIAGNOSIS — I69354 Hemiplegia and hemiparesis following cerebral infarction affecting left non-dominant side: Secondary | ICD-10-CM | POA: Diagnosis not present

## 2022-05-22 DIAGNOSIS — I890 Lymphedema, not elsewhere classified: Secondary | ICD-10-CM | POA: Diagnosis not present

## 2022-05-22 DIAGNOSIS — I480 Paroxysmal atrial fibrillation: Secondary | ICD-10-CM | POA: Diagnosis not present

## 2022-05-22 DIAGNOSIS — I10 Essential (primary) hypertension: Secondary | ICD-10-CM | POA: Diagnosis not present

## 2022-05-27 DIAGNOSIS — E1151 Type 2 diabetes mellitus with diabetic peripheral angiopathy without gangrene: Secondary | ICD-10-CM | POA: Diagnosis not present

## 2022-05-27 DIAGNOSIS — I890 Lymphedema, not elsewhere classified: Secondary | ICD-10-CM | POA: Diagnosis not present

## 2022-05-27 DIAGNOSIS — I872 Venous insufficiency (chronic) (peripheral): Secondary | ICD-10-CM | POA: Diagnosis not present

## 2022-05-27 DIAGNOSIS — E782 Mixed hyperlipidemia: Secondary | ICD-10-CM | POA: Diagnosis not present

## 2022-05-27 DIAGNOSIS — I69354 Hemiplegia and hemiparesis following cerebral infarction affecting left non-dominant side: Secondary | ICD-10-CM | POA: Diagnosis not present

## 2022-05-27 DIAGNOSIS — I480 Paroxysmal atrial fibrillation: Secondary | ICD-10-CM | POA: Diagnosis not present

## 2022-05-27 DIAGNOSIS — Z7984 Long term (current) use of oral hypoglycemic drugs: Secondary | ICD-10-CM | POA: Diagnosis not present

## 2022-05-27 DIAGNOSIS — I471 Supraventricular tachycardia: Secondary | ICD-10-CM | POA: Diagnosis not present

## 2022-05-27 DIAGNOSIS — E1142 Type 2 diabetes mellitus with diabetic polyneuropathy: Secondary | ICD-10-CM | POA: Diagnosis not present

## 2022-05-27 DIAGNOSIS — H409 Unspecified glaucoma: Secondary | ICD-10-CM | POA: Diagnosis not present

## 2022-05-27 DIAGNOSIS — I2510 Atherosclerotic heart disease of native coronary artery without angina pectoris: Secondary | ICD-10-CM | POA: Diagnosis not present

## 2022-05-27 DIAGNOSIS — Z7901 Long term (current) use of anticoagulants: Secondary | ICD-10-CM | POA: Diagnosis not present

## 2022-05-27 DIAGNOSIS — Z9181 History of falling: Secondary | ICD-10-CM | POA: Diagnosis not present

## 2022-05-27 DIAGNOSIS — Z794 Long term (current) use of insulin: Secondary | ICD-10-CM | POA: Diagnosis not present

## 2022-05-27 DIAGNOSIS — I10 Essential (primary) hypertension: Secondary | ICD-10-CM | POA: Diagnosis not present

## 2022-05-28 DIAGNOSIS — I890 Lymphedema, not elsewhere classified: Secondary | ICD-10-CM | POA: Diagnosis not present

## 2022-05-28 DIAGNOSIS — I872 Venous insufficiency (chronic) (peripheral): Secondary | ICD-10-CM | POA: Diagnosis not present

## 2022-05-28 DIAGNOSIS — I480 Paroxysmal atrial fibrillation: Secondary | ICD-10-CM | POA: Diagnosis not present

## 2022-05-28 DIAGNOSIS — I10 Essential (primary) hypertension: Secondary | ICD-10-CM | POA: Diagnosis not present

## 2022-05-28 DIAGNOSIS — I69354 Hemiplegia and hemiparesis following cerebral infarction affecting left non-dominant side: Secondary | ICD-10-CM | POA: Diagnosis not present

## 2022-05-28 DIAGNOSIS — E1151 Type 2 diabetes mellitus with diabetic peripheral angiopathy without gangrene: Secondary | ICD-10-CM | POA: Diagnosis not present

## 2022-05-30 ENCOUNTER — Telehealth: Payer: Self-pay | Admitting: Family Medicine

## 2022-05-30 NOTE — Telephone Encounter (Signed)
[place] with [facility] [phone]  Reporting: Pt is missing PT this week due to caregiver saying he twisted his right knee, irritation in toe and drainage/weeping edema in left leg  Stepping up PT next week, but maxing out   Nursing will still stay a little longer with Patient

## 2022-05-31 NOTE — Telephone Encounter (Signed)
Noted  

## 2022-06-04 DIAGNOSIS — E1151 Type 2 diabetes mellitus with diabetic peripheral angiopathy without gangrene: Secondary | ICD-10-CM | POA: Diagnosis not present

## 2022-06-04 DIAGNOSIS — I480 Paroxysmal atrial fibrillation: Secondary | ICD-10-CM | POA: Diagnosis not present

## 2022-06-04 DIAGNOSIS — I10 Essential (primary) hypertension: Secondary | ICD-10-CM | POA: Diagnosis not present

## 2022-06-04 DIAGNOSIS — I872 Venous insufficiency (chronic) (peripheral): Secondary | ICD-10-CM | POA: Diagnosis not present

## 2022-06-04 DIAGNOSIS — I69354 Hemiplegia and hemiparesis following cerebral infarction affecting left non-dominant side: Secondary | ICD-10-CM | POA: Diagnosis not present

## 2022-06-04 DIAGNOSIS — I890 Lymphedema, not elsewhere classified: Secondary | ICD-10-CM | POA: Diagnosis not present

## 2022-06-05 DIAGNOSIS — E1151 Type 2 diabetes mellitus with diabetic peripheral angiopathy without gangrene: Secondary | ICD-10-CM | POA: Diagnosis not present

## 2022-06-05 DIAGNOSIS — I890 Lymphedema, not elsewhere classified: Secondary | ICD-10-CM | POA: Diagnosis not present

## 2022-06-05 DIAGNOSIS — I872 Venous insufficiency (chronic) (peripheral): Secondary | ICD-10-CM | POA: Diagnosis not present

## 2022-06-05 DIAGNOSIS — I69354 Hemiplegia and hemiparesis following cerebral infarction affecting left non-dominant side: Secondary | ICD-10-CM | POA: Diagnosis not present

## 2022-06-05 DIAGNOSIS — I10 Essential (primary) hypertension: Secondary | ICD-10-CM | POA: Diagnosis not present

## 2022-06-05 DIAGNOSIS — I480 Paroxysmal atrial fibrillation: Secondary | ICD-10-CM | POA: Diagnosis not present

## 2022-06-11 ENCOUNTER — Telehealth: Payer: Self-pay

## 2022-06-11 ENCOUNTER — Other Ambulatory Visit: Payer: Self-pay | Admitting: Family Medicine

## 2022-06-11 DIAGNOSIS — I69354 Hemiplegia and hemiparesis following cerebral infarction affecting left non-dominant side: Secondary | ICD-10-CM

## 2022-06-11 NOTE — Telephone Encounter (Signed)
-  Caller states that he is having some spasms in his legs, the contractions are painful and has been going on since about 1900 last night.  06/07/2022 9:26:23 AM SEE PCP WITHIN 3 DAYS Donadeo, RN, Kristin  Referrals REFERRED TO PCP OFFICE  Pt had appt to see PCP on 06/12/22 & called this morning (06/11/22) to cancel.

## 2022-06-12 ENCOUNTER — Ambulatory Visit: Payer: Medicare Other | Admitting: Family Medicine

## 2022-06-12 DIAGNOSIS — I872 Venous insufficiency (chronic) (peripheral): Secondary | ICD-10-CM | POA: Diagnosis not present

## 2022-06-12 DIAGNOSIS — I480 Paroxysmal atrial fibrillation: Secondary | ICD-10-CM | POA: Diagnosis not present

## 2022-06-12 DIAGNOSIS — I890 Lymphedema, not elsewhere classified: Secondary | ICD-10-CM | POA: Diagnosis not present

## 2022-06-12 DIAGNOSIS — I69354 Hemiplegia and hemiparesis following cerebral infarction affecting left non-dominant side: Secondary | ICD-10-CM | POA: Diagnosis not present

## 2022-06-12 DIAGNOSIS — I10 Essential (primary) hypertension: Secondary | ICD-10-CM | POA: Diagnosis not present

## 2022-06-12 DIAGNOSIS — E1151 Type 2 diabetes mellitus with diabetic peripheral angiopathy without gangrene: Secondary | ICD-10-CM | POA: Diagnosis not present

## 2022-06-21 DIAGNOSIS — I10 Essential (primary) hypertension: Secondary | ICD-10-CM | POA: Diagnosis not present

## 2022-06-21 DIAGNOSIS — I69354 Hemiplegia and hemiparesis following cerebral infarction affecting left non-dominant side: Secondary | ICD-10-CM | POA: Diagnosis not present

## 2022-06-21 DIAGNOSIS — I480 Paroxysmal atrial fibrillation: Secondary | ICD-10-CM | POA: Diagnosis not present

## 2022-06-21 DIAGNOSIS — I872 Venous insufficiency (chronic) (peripheral): Secondary | ICD-10-CM | POA: Diagnosis not present

## 2022-06-21 DIAGNOSIS — E1151 Type 2 diabetes mellitus with diabetic peripheral angiopathy without gangrene: Secondary | ICD-10-CM | POA: Diagnosis not present

## 2022-06-21 DIAGNOSIS — I890 Lymphedema, not elsewhere classified: Secondary | ICD-10-CM | POA: Diagnosis not present

## 2022-06-26 DIAGNOSIS — E782 Mixed hyperlipidemia: Secondary | ICD-10-CM | POA: Diagnosis not present

## 2022-06-26 DIAGNOSIS — E1142 Type 2 diabetes mellitus with diabetic polyneuropathy: Secondary | ICD-10-CM | POA: Diagnosis not present

## 2022-06-26 DIAGNOSIS — I2510 Atherosclerotic heart disease of native coronary artery without angina pectoris: Secondary | ICD-10-CM | POA: Diagnosis not present

## 2022-06-26 DIAGNOSIS — E1151 Type 2 diabetes mellitus with diabetic peripheral angiopathy without gangrene: Secondary | ICD-10-CM | POA: Diagnosis not present

## 2022-06-26 DIAGNOSIS — I872 Venous insufficiency (chronic) (peripheral): Secondary | ICD-10-CM | POA: Diagnosis not present

## 2022-06-26 DIAGNOSIS — Z7901 Long term (current) use of anticoagulants: Secondary | ICD-10-CM | POA: Diagnosis not present

## 2022-06-26 DIAGNOSIS — I480 Paroxysmal atrial fibrillation: Secondary | ICD-10-CM | POA: Diagnosis not present

## 2022-06-26 DIAGNOSIS — I471 Supraventricular tachycardia, unspecified: Secondary | ICD-10-CM | POA: Diagnosis not present

## 2022-06-26 DIAGNOSIS — I10 Essential (primary) hypertension: Secondary | ICD-10-CM | POA: Diagnosis not present

## 2022-06-26 DIAGNOSIS — I69354 Hemiplegia and hemiparesis following cerebral infarction affecting left non-dominant side: Secondary | ICD-10-CM | POA: Diagnosis not present

## 2022-06-26 DIAGNOSIS — Z7984 Long term (current) use of oral hypoglycemic drugs: Secondary | ICD-10-CM | POA: Diagnosis not present

## 2022-06-26 DIAGNOSIS — H409 Unspecified glaucoma: Secondary | ICD-10-CM | POA: Diagnosis not present

## 2022-06-26 DIAGNOSIS — I890 Lymphedema, not elsewhere classified: Secondary | ICD-10-CM | POA: Diagnosis not present

## 2022-06-26 DIAGNOSIS — Z794 Long term (current) use of insulin: Secondary | ICD-10-CM | POA: Diagnosis not present

## 2022-06-26 DIAGNOSIS — Z9181 History of falling: Secondary | ICD-10-CM | POA: Diagnosis not present

## 2022-06-26 DIAGNOSIS — S80822D Blister (nonthermal), left lower leg, subsequent encounter: Secondary | ICD-10-CM | POA: Diagnosis not present

## 2022-06-26 DIAGNOSIS — Z4800 Encounter for change or removal of nonsurgical wound dressing: Secondary | ICD-10-CM | POA: Diagnosis not present

## 2022-06-27 ENCOUNTER — Other Ambulatory Visit: Payer: Self-pay | Admitting: Family Medicine

## 2022-06-27 ENCOUNTER — Telehealth: Payer: Self-pay | Admitting: Family Medicine

## 2022-06-27 DIAGNOSIS — I69354 Hemiplegia and hemiparesis following cerebral infarction affecting left non-dominant side: Secondary | ICD-10-CM

## 2022-06-27 NOTE — Telephone Encounter (Signed)
[name] from [facility] call and stated she need verbal order for Nursing once a wk for 8 wk's also stated you can leave a message on her voice mail.

## 2022-06-28 NOTE — Telephone Encounter (Signed)
[name] Wooten called again, following up on request for verbal orders - ok to leave a detailed message at [phone].

## 2022-07-02 ENCOUNTER — Telehealth: Payer: Self-pay | Admitting: Family Medicine

## 2022-07-02 DIAGNOSIS — S80822D Blister (nonthermal), left lower leg, subsequent encounter: Secondary | ICD-10-CM | POA: Diagnosis not present

## 2022-07-02 DIAGNOSIS — E1151 Type 2 diabetes mellitus with diabetic peripheral angiopathy without gangrene: Secondary | ICD-10-CM | POA: Diagnosis not present

## 2022-07-02 DIAGNOSIS — I69354 Hemiplegia and hemiparesis following cerebral infarction affecting left non-dominant side: Secondary | ICD-10-CM | POA: Diagnosis not present

## 2022-07-02 DIAGNOSIS — I890 Lymphedema, not elsewhere classified: Secondary | ICD-10-CM | POA: Diagnosis not present

## 2022-07-02 DIAGNOSIS — I10 Essential (primary) hypertension: Secondary | ICD-10-CM | POA: Diagnosis not present

## 2022-07-02 DIAGNOSIS — I872 Venous insufficiency (chronic) (peripheral): Secondary | ICD-10-CM | POA: Diagnosis not present

## 2022-07-02 NOTE — Telephone Encounter (Signed)
Adonis Brook rn with [facility] is calling and needs VO for SN 1X8

## 2022-07-04 NOTE — Telephone Encounter (Signed)
Calling back to check on progress of this request.

## 2022-07-08 ENCOUNTER — Telehealth: Payer: Self-pay | Admitting: *Deleted

## 2022-07-08 NOTE — Patient Outreach (Signed)
  Care Coordination   07/08/2022 Name: Jimmy Carr MRN: [id number] DOB: [birth year]   Care Coordination Outreach Attempts:  An unsuccessful telephone outreach was attempted today to offer the patient information about available care coordination services as a benefit of their health plan.   Follow Up Plan:  Additional outreach attempts will be made to offer the patient care coordination information and services.   Encounter Outcome:  No Answer  Care Coordination Interventions Activated:  No   Care Coordination Interventions:  No, not indicated    Raina Mina, RN Care Management Coordinator [place] Office [phone]

## 2022-07-09 ENCOUNTER — Other Ambulatory Visit: Payer: Self-pay | Admitting: Family Medicine

## 2022-07-09 DIAGNOSIS — E1140 Type 2 diabetes mellitus with diabetic neuropathy, unspecified: Secondary | ICD-10-CM

## 2022-07-10 DIAGNOSIS — S80822D Blister (nonthermal), left lower leg, subsequent encounter: Secondary | ICD-10-CM | POA: Diagnosis not present

## 2022-07-10 DIAGNOSIS — E1151 Type 2 diabetes mellitus with diabetic peripheral angiopathy without gangrene: Secondary | ICD-10-CM | POA: Diagnosis not present

## 2022-07-10 DIAGNOSIS — I890 Lymphedema, not elsewhere classified: Secondary | ICD-10-CM | POA: Diagnosis not present

## 2022-07-10 DIAGNOSIS — I69354 Hemiplegia and hemiparesis following cerebral infarction affecting left non-dominant side: Secondary | ICD-10-CM | POA: Diagnosis not present

## 2022-07-10 DIAGNOSIS — I872 Venous insufficiency (chronic) (peripheral): Secondary | ICD-10-CM | POA: Diagnosis not present

## 2022-07-10 DIAGNOSIS — I10 Essential (primary) hypertension: Secondary | ICD-10-CM | POA: Diagnosis not present

## 2022-07-10 NOTE — Telephone Encounter (Signed)
Still waiting for approval for wound care:  1x8 and 2PRNs Okay to leave detailed message Jimmy Carr [phone]

## 2022-07-10 NOTE — Telephone Encounter (Signed)
Ok

## 2022-07-10 NOTE — Telephone Encounter (Signed)
ok 

## 2022-07-11 ENCOUNTER — Telehealth: Payer: Self-pay | Admitting: *Deleted

## 2022-07-11 ENCOUNTER — Encounter: Payer: Self-pay | Admitting: *Deleted

## 2022-07-11 NOTE — Telephone Encounter (Signed)
Let VM for [name], given okay for VO, per Dr Volanda Napoleon.

## 2022-07-11 NOTE — Telephone Encounter (Signed)
Lelon Huh, no answer, did leave detailed VM that DR Volanda Napoleon okay'd the VO for wound care.

## 2022-07-11 NOTE — Patient Outreach (Signed)
  Care Coordination   Initial Visit Note   07/11/2022 Name: Jimmy Carr MRN: [id number] DOB: 1941-08-12  GAGANDEEP PETTET is a 81 y.o. year old male who sees [name], MD for primary care. I spoke with  Tressia Danas by phone today.  What matters to the patients health and wellness today?  No needs    Goals Addressed               This Visit's Progress     COMPLETED: "doing well with no needs" (pt-stated)        Care Coordination Interventions: Reviewed medications with patient and discussed adherence with no needed refills or issues. Reviewed scheduled/upcoming provider appointments including pending appointments and verified completed AWV for this year. Screening for signs and symptoms of depression related to chronic disease state  Assessed social determinant of health barriers          SDOH assessments and interventions completed:  Yes  SDOH Interventions Today    Flowsheet Row Most Recent Value  SDOH Interventions   Food Insecurity Interventions Intervention Not Indicated  Housing Interventions Intervention Not Indicated  Transportation Interventions Intervention Not Indicated  Utilities Interventions Intervention Not Indicated        Care Coordination Interventions Activated:  Yes  Care Coordination Interventions:  Yes, provided   Follow up plan: No further intervention required.   Encounter Outcome:  Pt. Visit Completed   Raina Mina, RN Care Management Coordinator [place] Office [phone]

## 2022-07-11 NOTE — Patient Instructions (Signed)
Visit Information  Thank you for taking time to visit with me today. Please don't hesitate to contact me if I can be of assistance to you.   Following are the goals we discussed today:   Goals Addressed               This Visit's Progress     COMPLETED: "doing well with no needs" (pt-stated)        Care Coordination Interventions: Reviewed medications with patient and discussed adherence with no needed refills or issues. Reviewed scheduled/upcoming provider appointments including pending appointments and verified completed AWV for this year. Screening for signs and symptoms of depression related to chronic disease state  Assessed social determinant of health barriers         Please call the care guide team at [phone] if you need to cancel or reschedule your appointment.   If you are experiencing a Mental Health or [place] or need someone to talk to, please call the Suicide and Crisis Lifeline: 988  Patient verbalizes understanding of instructions and care plan provided today and agrees to view in [place]. Active MyChart status and patient understanding of how to access instructions and care plan via MyChart confirmed with patient.     No further follow up required: No needs  Raina Mina, RN Care Management Coordinator [place] Office [phone]

## 2022-07-19 DIAGNOSIS — I10 Essential (primary) hypertension: Secondary | ICD-10-CM | POA: Diagnosis not present

## 2022-07-19 DIAGNOSIS — E1151 Type 2 diabetes mellitus with diabetic peripheral angiopathy without gangrene: Secondary | ICD-10-CM | POA: Diagnosis not present

## 2022-07-19 DIAGNOSIS — I872 Venous insufficiency (chronic) (peripheral): Secondary | ICD-10-CM | POA: Diagnosis not present

## 2022-07-19 DIAGNOSIS — I890 Lymphedema, not elsewhere classified: Secondary | ICD-10-CM | POA: Diagnosis not present

## 2022-07-19 DIAGNOSIS — I69354 Hemiplegia and hemiparesis following cerebral infarction affecting left non-dominant side: Secondary | ICD-10-CM | POA: Diagnosis not present

## 2022-07-19 DIAGNOSIS — S80822D Blister (nonthermal), left lower leg, subsequent encounter: Secondary | ICD-10-CM | POA: Diagnosis not present

## 2022-07-22 ENCOUNTER — Other Ambulatory Visit: Payer: Self-pay | Admitting: Family Medicine

## 2022-07-24 DIAGNOSIS — S80822D Blister (nonthermal), left lower leg, subsequent encounter: Secondary | ICD-10-CM | POA: Diagnosis not present

## 2022-07-24 DIAGNOSIS — E1151 Type 2 diabetes mellitus with diabetic peripheral angiopathy without gangrene: Secondary | ICD-10-CM | POA: Diagnosis not present

## 2022-07-24 DIAGNOSIS — I872 Venous insufficiency (chronic) (peripheral): Secondary | ICD-10-CM | POA: Diagnosis not present

## 2022-07-24 DIAGNOSIS — I890 Lymphedema, not elsewhere classified: Secondary | ICD-10-CM | POA: Diagnosis not present

## 2022-07-24 DIAGNOSIS — I10 Essential (primary) hypertension: Secondary | ICD-10-CM | POA: Diagnosis not present

## 2022-07-24 DIAGNOSIS — I69354 Hemiplegia and hemiparesis following cerebral infarction affecting left non-dominant side: Secondary | ICD-10-CM | POA: Diagnosis not present

## 2022-07-26 DIAGNOSIS — E1142 Type 2 diabetes mellitus with diabetic polyneuropathy: Secondary | ICD-10-CM | POA: Diagnosis not present

## 2022-07-26 DIAGNOSIS — I471 Supraventricular tachycardia, unspecified: Secondary | ICD-10-CM | POA: Diagnosis not present

## 2022-07-26 DIAGNOSIS — I10 Essential (primary) hypertension: Secondary | ICD-10-CM | POA: Diagnosis not present

## 2022-07-26 DIAGNOSIS — I890 Lymphedema, not elsewhere classified: Secondary | ICD-10-CM | POA: Diagnosis not present

## 2022-07-26 DIAGNOSIS — E782 Mixed hyperlipidemia: Secondary | ICD-10-CM | POA: Diagnosis not present

## 2022-07-26 DIAGNOSIS — Z9181 History of falling: Secondary | ICD-10-CM | POA: Diagnosis not present

## 2022-07-26 DIAGNOSIS — I69354 Hemiplegia and hemiparesis following cerebral infarction affecting left non-dominant side: Secondary | ICD-10-CM | POA: Diagnosis not present

## 2022-07-26 DIAGNOSIS — I480 Paroxysmal atrial fibrillation: Secondary | ICD-10-CM | POA: Diagnosis not present

## 2022-07-26 DIAGNOSIS — S80822D Blister (nonthermal), left lower leg, subsequent encounter: Secondary | ICD-10-CM | POA: Diagnosis not present

## 2022-07-26 DIAGNOSIS — Z4800 Encounter for change or removal of nonsurgical wound dressing: Secondary | ICD-10-CM | POA: Diagnosis not present

## 2022-07-26 DIAGNOSIS — Z794 Long term (current) use of insulin: Secondary | ICD-10-CM | POA: Diagnosis not present

## 2022-07-26 DIAGNOSIS — I2510 Atherosclerotic heart disease of native coronary artery without angina pectoris: Secondary | ICD-10-CM | POA: Diagnosis not present

## 2022-07-26 DIAGNOSIS — H409 Unspecified glaucoma: Secondary | ICD-10-CM | POA: Diagnosis not present

## 2022-07-26 DIAGNOSIS — Z7901 Long term (current) use of anticoagulants: Secondary | ICD-10-CM | POA: Diagnosis not present

## 2022-07-26 DIAGNOSIS — I872 Venous insufficiency (chronic) (peripheral): Secondary | ICD-10-CM | POA: Diagnosis not present

## 2022-07-26 DIAGNOSIS — Z7984 Long term (current) use of oral hypoglycemic drugs: Secondary | ICD-10-CM | POA: Diagnosis not present

## 2022-07-26 DIAGNOSIS — E1151 Type 2 diabetes mellitus with diabetic peripheral angiopathy without gangrene: Secondary | ICD-10-CM | POA: Diagnosis not present

## 2022-07-30 ENCOUNTER — Telehealth: Payer: Self-pay

## 2022-07-30 NOTE — Telephone Encounter (Signed)
PA started for insulin Lispro,   Key: [id number].

## 2022-07-31 DIAGNOSIS — I10 Essential (primary) hypertension: Secondary | ICD-10-CM | POA: Diagnosis not present

## 2022-07-31 DIAGNOSIS — I890 Lymphedema, not elsewhere classified: Secondary | ICD-10-CM | POA: Diagnosis not present

## 2022-07-31 DIAGNOSIS — E1151 Type 2 diabetes mellitus with diabetic peripheral angiopathy without gangrene: Secondary | ICD-10-CM | POA: Diagnosis not present

## 2022-07-31 DIAGNOSIS — I69354 Hemiplegia and hemiparesis following cerebral infarction affecting left non-dominant side: Secondary | ICD-10-CM | POA: Diagnosis not present

## 2022-07-31 DIAGNOSIS — I872 Venous insufficiency (chronic) (peripheral): Secondary | ICD-10-CM | POA: Diagnosis not present

## 2022-07-31 DIAGNOSIS — S80822D Blister (nonthermal), left lower leg, subsequent encounter: Secondary | ICD-10-CM | POA: Diagnosis not present

## 2022-08-01 ENCOUNTER — Ambulatory Visit (INDEPENDENT_AMBULATORY_CARE_PROVIDER_SITE_OTHER): Payer: Medicare Other | Admitting: Family Medicine

## 2022-08-01 VITALS — BP 138/78 | HR 95 | Temp 98.0°F

## 2022-08-01 DIAGNOSIS — E1140 Type 2 diabetes mellitus with diabetic neuropathy, unspecified: Secondary | ICD-10-CM

## 2022-08-01 DIAGNOSIS — J029 Acute pharyngitis, unspecified: Secondary | ICD-10-CM

## 2022-08-01 DIAGNOSIS — Z794 Long term (current) use of insulin: Secondary | ICD-10-CM

## 2022-08-01 DIAGNOSIS — R0982 Postnasal drip: Secondary | ICD-10-CM

## 2022-08-01 DIAGNOSIS — I10 Essential (primary) hypertension: Secondary | ICD-10-CM | POA: Diagnosis not present

## 2022-08-01 DIAGNOSIS — I890 Lymphedema, not elsewhere classified: Secondary | ICD-10-CM

## 2022-08-01 DIAGNOSIS — I2510 Atherosclerotic heart disease of native coronary artery without angina pectoris: Secondary | ICD-10-CM

## 2022-08-01 DIAGNOSIS — Z8673 Personal history of transient ischemic attack (TIA), and cerebral infarction without residual deficits: Secondary | ICD-10-CM

## 2022-08-01 NOTE — Progress Notes (Signed)
Subjective:    Patient ID: Jimmy Carr, male    DOB: [birth year], 82 y.o.   MRN: [id number]  Chief Complaint  Patient presents with   Leg Swelling    Not having weeping, would like to see about getting juxtalites, long term leg care. Still left foot and knee swelling, even while trying to elevate.  Pt accompanied by his wife and daughter.  HPI Patient is a 81 year old male with pmh sig for h/o CVA with residual L hemiparesis, afib, CAD, HTN, DM 2, peripheral neuropathy, nonproliferative diabetic retinopathy, HLD, bilateral primary open-angle glaucoma, was seen today for f/u.  HH has been really helpful for pt and family.  B/l LEs improving.  Skin without open sores, occassional weeping.  Family inquires about JuxtaLite wraps for her legs to help with swelling.  Taking Lasix 20 mg daily.  Wrapping LEs.  Patient endorses sore throat and postnasal drainage x several days.  States it feels funny when he swallowing.   Past Medical History:  Diagnosis Date   AF (atrial fibrillation) (HCC)    2D ECHO, 03/01/1987 - normal; NUCLEAR STRESS TEST, 04/10/2006 - EF 65%, no ischemia   Bruit    CAROTID DOPPLER, 09/20/2008 - normal, no evidence of diameter reduction, significant tortuousity or any other vascular abnormality   Cerebrovascular accident [facility]) 2003   history of small vessal lacunar stroke 2003   Diabetes mellitus type II    Glaucoma    Hyperlipidemia    Hypertension    Other specified cardiac dysrhythmias(427.89)    PSVT   Peripheral neuropathy     Allergies  Allergen Reactions   Cheratussin Ac [Guaifenesin-Codeine] Nausea And Vomiting   Verapamil     ROS General: Denies fever, chills, night sweats, changes in weight, changes in appetite HEENT: Denies headaches, ear pain, changes in vision, rhinorrhea, +sore throat, postnasal drainage CV: Denies CP, palpitations, SOB, orthopnea  +LE edema Pulm: Denies SOB, cough, wheezing GI: Denies abdominal pain, nausea, vomiting, diarrhea,  constipation GU: Denies dysuria, hematuria, frequency Msk: Denies muscle cramps, joint pains Neuro: Denies weakness, numbness, tingling Skin: Denies rashes, bruising Psych: Denies depression, anxiety, hallucinations     Objective:    Blood pressure 138/78, pulse 95, temperature 98 F (36.7 C), temperature source Oral, SpO2 96 %.  Gen. Pleasant, well-nourished, in no distress, normal affect   HEENT: [place]/AT, face symmetric, conjunctiva clear, no scleral icterus, PERRLA, EOMI, nares patent without drainage Lungs: no accessory muscle use, CTAB, no wheezes or rales Cardiovascular: RRR, no m/r/g, bilateral pitting edema left> right, 1-2+ of feet, ankles, 1+ at shin and trace at knees Musculoskeletal: No deformities, no cyanosis or clubbing, normal tone Neuro:  A&Ox3, CN II-XII intact, gait not assessed as sitting in transport wheelchair Skin:  Warm, dry.  Wrapping of bilateral LEs removed.  Without drainage, hyperpigmented venous stasis changes bilateral shins.  Legs rewrapped.  Wt Readings from Last 3 Encounters:  04/25/22 250 lb (113.4 kg)  01/04/22 250 lb (113.4 kg)  01/02/22 250 lb (113.4 kg)    Lab Results  Component Value Date   WBC 8.1 02/22/2022   HGB 15.2 02/22/2022   HCT 46.9 02/22/2022   PLT 157.0 02/22/2022   GLUCOSE 299 (H) 02/22/2022   CHOL 136 03/02/2020   TRIG 147.0 03/02/2020   HDL 36.00 (L) 03/02/2020   LDLDIRECT 67.0 05/16/2015   LDLCALC 70 03/02/2020   ALT 24 02/22/2022   AST 19 02/22/2022   NA 137 02/22/2022   K 3.8 02/22/2022   CL  100 02/22/2022   CREATININE 1.15 02/22/2022   BUN 21 02/22/2022   CO2 29 02/22/2022   TSH 1.86 12/31/2017   PSA 1.92 03/02/2020   HGBA1C 7.9 (H) 03/02/2020   MICROALBUR 2.0 (H) 12/31/2017    Assessment/Plan:  Chronic acquired lymphedema -Likely due to history of CVA with left hemiparesis, decreased physical activity. -legs rewrapped by CMA -Discussed the importance of elevating LEs -Previously on low-dose HCTZ.  Pt  initially hesitant due to expected increase in urination but eventually switched to Lasix -Discussed increasing Lasix from 20 mg to 40 mg for the next few days with an extra dose of K-dur -continue H/H   Type 2 diabetes mellitus with diabetic neuropathy, with long-term current use of insulin (HCC)  -Continue Humalog 50 units with breakfast, 35 units with lunch, 15 units with dinner, metformin 1000 mg twice daily -Continue ACE I and statin -Exam due December 2023 -Encouraged to continue regular eye exams - Plan: POC HgB A1c  History of CVA (cerebrovascular accident) -BP control -Continue statin -Continue PT/home health  Post-nasal drainage -likely causing throat irritation -start OTC antihistamine Allegra  Essential hypertension -controlled -Continue lifestyle modifications -Continue current medications including lisinopril 40 mg daily, Coreg 3.125 mg twice daily -We will increase Lasix from 20 to 40 mg for the next few days for LE edema. -Monitor renal function -continue f/u with Cards  Sore throat -strep test negative. -symptoms likely d/t post nasal drainage vs Gerd. -start OTC antihistamine allegra. -Plan: POC Rapid strep  F/u prn in the next 2-4 wks  Grier Mitts, MD

## 2022-08-01 NOTE — Patient Instructions (Addendum)
Strep test was negative.  You can try the Allegra to see if you notice a difference in the throat symptoms.

## 2022-08-02 ENCOUNTER — Ambulatory Visit: Payer: Medicare Other | Attending: Internal Medicine | Admitting: Internal Medicine

## 2022-08-02 ENCOUNTER — Encounter: Payer: Self-pay | Admitting: Internal Medicine

## 2022-08-02 VITALS — BP 122/100 | HR 87 | Ht 72.0 in | Wt 265.0 lb

## 2022-08-02 DIAGNOSIS — E118 Type 2 diabetes mellitus with unspecified complications: Secondary | ICD-10-CM | POA: Insufficient documentation

## 2022-08-02 DIAGNOSIS — I2510 Atherosclerotic heart disease of native coronary artery without angina pectoris: Secondary | ICD-10-CM | POA: Diagnosis not present

## 2022-08-02 DIAGNOSIS — Z794 Long term (current) use of insulin: Secondary | ICD-10-CM | POA: Diagnosis not present

## 2022-08-02 DIAGNOSIS — I4819 Other persistent atrial fibrillation: Secondary | ICD-10-CM | POA: Diagnosis not present

## 2022-08-02 DIAGNOSIS — I10 Essential (primary) hypertension: Secondary | ICD-10-CM | POA: Diagnosis not present

## 2022-08-02 DIAGNOSIS — R600 Localized edema: Secondary | ICD-10-CM | POA: Insufficient documentation

## 2022-08-02 MED ORDER — FUROSEMIDE 40 MG PO TABS: 40.0000 mg | ORAL_TABLET | Freq: Every day | ORAL | 3 refills | Status: DC

## 2022-08-02 NOTE — Patient Instructions (Signed)
Medication Instructions:  INCREASE furosemide to '40mg'$  daily  *If you need a refill on your cardiac medications before your next appointment, please call your pharmacy*   Lab Work: Non-Fasting BMET and BNP in 1-2 weeks  If you have labs (blood work) drawn today and your tests are completely normal, you will receive your results only by: [place] (if you have MyChart) OR A paper copy in the mail If you have any lab test that is abnormal or we need to change your treatment, we will call you to review the results.   Testing/Procedures: NONE   Follow-Up: At [facility], you and your health needs are our priority.  As part of our continuing mission to provide you with exceptional heart care, we have created designated Provider Care Teams.  These Care Teams include your primary Cardiologist (physician) and Advanced Practice Providers (APPs -  Physician Assistants and Nurse Practitioners) who all work together to provide you with the care you need, when you need it.  We recommend signing up for the patient portal called "MyChart".  Sign up information is provided on this After Visit Summary.  MyChart is used to connect with patients for Virtual Visits (Telemedicine).  Patients are able to view lab/test results, encounter notes, upcoming appointments, etc.  Non-urgent messages can be sent to your provider as well.   To learn more about what you can do with MyChart, go to [email or web address].    Your next appointment:    1 month with Dr. Debara Pickett or [place] PA

## 2022-08-02 NOTE — Progress Notes (Signed)
OFFICE NOTE  Chief Complaint:  Follow-up  Primary Care Physician: [name], MD  HPI:  Jimmy Carr is a 81 year-old male with a history of PAF. His atrial fib actually started in the mid 80s. He was on Coumadin, beta blockers, and Lanoxin, and over the years, we have been able to wean him off his beta blockers. He is still on a low dose of Lanoxin and several years ago, we stopped the Coumadin, and his CHADS score at that time was 2, but with no atrial fibrillation in over 10 years, we stopped the Coumadin. He is on aspirin. He has had no recurrence of any atrial fib in over 15 years. He has no palpitations. No unusual shortness of breath. No chest pain. No exertional dyspnea. Minimal puffiness of his ankles at the end of the day. No dizziness, slurred speech, or blurred vision.  He is diabetic and has been so for over 30 years. His last hemoglobin A1c was 7.2. He now has a peripheral neuropathy that involves his feet, much greater than his hands. He had been tried on Neurontin with no real help. He checks his blood pressure at home. It has been under good control. He denies arthralgias or myalgias. He walks daily for at least 30 minutes, five to seven days a week. With this, he has no claudication, no unusual breathlessness. He has had no angina. He has diffuse T wave inversion on his EKG. He was catheterized in December 2009 that showed minimal coronary disease and normal LV function. He has no real complaints today, other than the increased cost of digoxin. Exactly unclear to me why he is on digoxin, as this medicine is unlikely to keep him out of atrial fibrillation. He does have a high CHADS2 score which is 4, despite in frequent atrial fibrillation. He continues on aspirin.  There are no new complaints of chest pain or worsening shortness of breath.  Jimmy Carr recently saw [name] in the office for what he thought was atypical chest pain. He recommended a nonsteroidal. He is  also reported more recent palpitations. He's been under significant stress recently. Today he called the office several times and spoke with our triage nurse about concern for atrial fibrillation. I did advise him that he may need to come in for an EKG to see if is actually in atrial fibrillation. Ultimately he did decide to do that.  Jimmy Carr returns today and is doing well. He is asymptomatic and has no more palpitations. We discussed to monitor his last visit if his symptoms did not improve but they seem to have improved. Overall he feels like he did well. He denies any chest pain. He reports good blood sugar control with his diabetes.  I saw Jimmy Carr back today in the office for follow-up. Overall he seems to be doing fairly well. He had recent laboratory work which shows an improvement in his lipid profile, however triglycerides remain elevated over 250. This is likely related to poorly controlled diabetes with an A1c of 7.8. He needs to continue to work on weight loss, exercise and reducing carbohydrates. He has not had any recurrent atrial fibrillation in over 15 years. Despite an elevated CHADSVASC score due to his low burden of A. fib he is only on aspirin therapy.  03/20/2017  Jimmy Carr returns today for follow-up. He denies any recurrent palpitations. He occasionally gets some lightheadedness. Recently he's had a feeling of discomfort in his stomach that  goes up to his chest. He says he feels hollow. He gets some lightheadedness and pain in the back of his neck. He describes it not like a headache but more of an aching sensation. He denies any exertional chest pain but has noticed more fatigue and dyspnea. EKG does not show atrial fibrillation today. He denies any hypoglycemia.  04/21/2018  Jimmy Carr returns today for follow-up.  Overall he is without complaints.  When I last saw him he was having some upper mid epigastric to lower central chest pain.  Ordered a stress test and a repeat  monitor for concern for possible A. fib.  He says his symptoms resolved spontaneously and he had neither test performed.  He says he is done well since that without any recurrent palpitations or chest pain.  In general he has had really good control over his blood sugars.  Recent hemoglobin A1c is 7 however on insulin and metformin.  Blood pressure is also well controlled 122/72.  LDL-C less than 70 on lovastatin.  EKG personally reviewed today shows sinus rhythm with nonspecific T wave changes at 76.  08/18/2019  Jimmy Carr is seen today as an add-on.  He was seen 2 days ago at [facility] in [place].  He had presented with symptoms concerning for TIA.  Cerebral imaging was performed including an echo which showed normal EF and negative bubble study.  He was noted on monitoring to have some brief episodes of irregular rhythm concerning for A. fib.  He does have a remote history of A. fib in the past.  It was felt this is the likely cause of his symptoms and the recommendation was to switch him to Eliquis.  In addition, he did have a lipid profile which showed a total cholesterol 114 and LDL 54.  A high potency statin was recommended and it was advised to switch to a atorvastatin from lovastatin.  04/06/2020  Jimmy Carr is seen today in follow-up.  He denies any recurrent TIAs.  Unfortunately after demonstrating what was felt to be atrial fibrillation on imaging studies I had recommended starting anticoagulation with Eliquis.  He decided that he was concerned too much about the risks of bleeding on that and is not currently taking it.  He remains only on low-dose aspirin.  He understands that I feel he is at increased risk for stroke and that his TIA may have been related to A. fib.  EKG today however does not show any atrial fibrillation.  08/08/2020  Jimmy Carr is seen today in follow-up.  He was just seen in the emergency department [facility] 2 days ago with paroxysmal atrial fibrillation.  He noted when  checking blood pressure and heart rate that his heart rate was elevated and irregular.  Otherwise he was not aware of any symptomatic A. fib.  According to the notes on presentation heart rate was in the 80s.  Blood pressure was also elevated at 282.  He was instructed to follow-up with me to discuss anticoagulation again.  Today we had a long discussion about anticoagulation and the risks and benefits of it.  He has concerns about the bleeding risks however understands that given his prior TIA, diabetes, hypertension and other risk factors that he has at high risk for stroke with atrial fibrillation.  He is again noted to be in A. fib today.  His CHA2DS2-VASc score is at least 6.  We again discussed anticoagulation with Eliquis and he is agreeable to proceed.  08/02/2022  Mr.  Carr returns today for follow-up.  He still struggling with significant lower extremity edema.  He been transition from hydrochlorothiazide over to Lasix 20 mg daily however he has had significant swelling.  He has bilateral Ace wraps in place however they started his ankle up to his shins and he has notable pedal edema.  He has a open wound that was bleeding on the left dorsum of his foot.  He was previously seen by Sande Rives who had stopped his metoprolol and switch him to carvedilol.  He is only on 3.125 twice a day.  Blood pressure was notable for diastolic hypertension with a blood pressure of 233 on the diastolic side.  He recently saw his PCP who recommended a pulse treatment with diuretics however I do not think that is going to solve a long-term swelling issue.  He really needs to be on higher doses of diuretic all the time.  PMHx:  Past Medical History:  Diagnosis Date   AF (atrial fibrillation) ([place])    2D ECHO, 03/01/1987 - normal; NUCLEAR STRESS TEST, 04/10/2006 - EF 65%, no ischemia   Bruit    CAROTID DOPPLER, 09/20/2008 - normal, no evidence of diameter reduction, significant tortuousity or any other vascular  abnormality   Cerebrovascular accident [facility]) 2003   history of small vessal lacunar stroke 2003   Diabetes mellitus type II    Glaucoma    Hyperlipidemia    Hypertension    Other specified cardiac dysrhythmias(427.89)    PSVT   Peripheral neuropathy     Past Surgical History:  Procedure Laterality Date   ANAL FISSURE REPAIR     CARDIAC CATHETERIZATION  02/02/2001   Normal LV systolic function, questionable mitral valve prolapse without mitral regurgitation, no evidence of CAD   CARDIAC CATHETERIZATION  09/13/2008   Minimal CAD, normal LV systolic function, no evidence of renal artery stenosis, distal aortic disease, or iliac disease    FAMHx:  Family History  Problem Relation Age of Onset   Diabetes Neg Hx    Colon cancer Neg Hx     SOCHx:   reports that he has never smoked. He has never used smokeless tobacco. He reports that he does not drink alcohol and does not use drugs.  ALLERGIES:  Allergies  Allergen Reactions   Cheratussin Ac [Guaifenesin-Codeine] Nausea And Vomiting   Verapamil     ROS: Pertinent items noted in HPI and remainder of comprehensive ROS otherwise negative.  HOME MEDS: Current Outpatient Medications  Medication Sig Dispense Refill   B-D ULTRAFINE III SHORT PEN 31G X 8 MM MISC USE FOUR TIMES DAILY BEFORE MEALS AND AT BEDTIME 200 each 5   carvedilol (COREG) 3.125 MG tablet Take 1 tablet (3.125 mg total) by mouth 2 (two) times daily. 180 tablet 3   furosemide (LASIX) 20 MG tablet TAKE 1 TABLET(20 MG) BY MOUTH DAILY 90 tablet 3   gabapentin (NEURONTIN) 100 MG capsule TAKE 1 CAPSULE(100 MG) BY MOUTH AT BEDTIME 90 capsule 1   glucose blood (PRODIGY NO CODING BLOOD GLUC) test strip USE AS DIRECTED THREE TIMES DAILY 100 strip 2   insulin lispro (HUMALOG) 100 UNIT/ML KwikPen INJECT 50 UNITS UNDER THE SKIN AT BREAKFAST, 35 UNITS WITH LUNCH, AND 50 UNITS WITH SUPPER 123 mL 0   latanoprost (XALATAN) 0.005 % ophthalmic solution 1 drop daily.      lisinopril  (ZESTRIL) 40 MG tablet TAKE 1 TABLET(40 MG) BY MOUTH DAILY 90 tablet 2   lovastatin (MEVACOR) 40 MG tablet TAKE  1 TABLET(40 MG) BY MOUTH AT BEDTIME 90 tablet 3   metFORMIN (GLUCOPHAGE) 1000 MG tablet TAKE 1 TABLET(1000 MG) BY MOUTH TWICE DAILY WITH A MEAL 180 tablet 1   methocarbamol (ROBAXIN) 500 MG tablet TAKE 1 TABLET(500 MG) BY MOUTH FOUR TIMES DAILY 360 tablet 1   potassium chloride SA (KLOR-CON M) 20 MEQ tablet TAKE 1 TABLET BY MOUTH AS NEEDED WHEN LASIX IS TAKEN 30 tablet 3   Prodigy Twist Top Lancets 28G MISC USE TO TEST BLOOD SUGAR THREE TIMES DAILY AS DIRECTED 100 each 2   rOPINIRole (REQUIP) 1 MG tablet TAKE 1 TABLET BY MOUTH THREE TIMES DAILY 270 tablet 1   timolol (TIMOPTIC-XR) 0.5 % ophthalmic gel-forming Place 1 drop into both eyes daily.      XARELTO 20 MG TABS tablet TAKE 1 TABLET BY MOUTH EVERY EVENING 90 tablet 1   Blood Glucose Monitoring Suppl (ACCU-CHEK AVIVA PLUS) w/Device KIT USED TO CHECK BLOOD SUGAR 3 TIMES DAILY OR PRN. (Patient not taking: Reported on 08/02/2022) 1 kit 0   hydrochlorothiazide (HYDRODIURIL) 25 MG tablet Take 1 tablet (25 mg total) by mouth daily. (Patient not taking: Reported on 08/02/2022) 90 tablet 3   LANTUS SOLOSTAR 100 UNIT/ML Solostar Pen ADMINISTER 60 UNITS UNDER THE SKIN AT 10 PM (Patient not taking: Reported on 08/02/2022) 15 mL 2   NYSTATIN powder APPLY TOPICALLY THREE TIMES DAILY (Patient not taking: Reported on 08/02/2022) 15 g 0   Current Facility-Administered Medications  Medication Dose Route Frequency Provider Last Rate Last Admin   0.9 %  sodium chloride infusion  500 mL Intravenous Continuous Ladene Artist, MD        LABS/IMAGING: No results found for this or any previous visit (from the past 48 hour(s)). No results found.  VITALS: BP (!) 122/100 (BP Location: Right Arm, Patient Position: Sitting, Cuff Size: Large)   Pulse 87   Ht 6' (1.829 m)   Wt 265 lb (120.2 kg) Comment: Approx  SpO2 98%   BMI 35.94 kg/m   EXAM: General  appearance: alert, no distress, and moderately obese Neck: no carotid bruit, no JVD, and thyroid not enlarged, symmetric, no tenderness/mass/nodules Lungs: clear to auscultation bilaterally Heart: irregularly irregular rhythm Abdomen: soft, non-tender; bowel sounds normal; no masses,  no organomegaly and obese Extremities: 2-3+ edema, ace wraps from ankle to shin, L foot small bleeding wound over dorsum Pulses: 2+ and symmetric Skin: Skin color, texture, turgor normal. No rashes or lesions Neurologic: Grossly normal  EKG: Deferred  ASSESSMENT: Persistent atrial fibrillation, CHADSVASC score of 6 Recent TIA Hypertension History of stroke, with right central vision loss Type 2 diabetes on insulin Dyslipidemia Sphenoid mass   PLAN: 1.   Mr. Caetano seems to have had worsening lower extremity edema.  Today he is wrapped incorrectly from his ankle up to the calves.  He has significant pedal edema because of this.  He was advised as he has been apparently in the past to wrap from the area at the base of the toes up to the upper shin area.  He needs more diuretic.  We will increase his Lasix to 40 mg daily.  Check a metabolic profile and BNP in about 2 weeks.  He will need follow-up in about a month.  He has not had a prior echocardiogram that I am aware of.  I think he would benefit from this as he may have some significant right heart failure symptoms or this could be purely issue of venous insufficiency and  neuropathy that might be causing his swelling. Would consider the study at his next visit.  Follow-up in 1 month or sooner as necessary.  Pixie Casino, MD, [facility], [place] Director of the [facility]  [facility] Diplomate of the American Board of Clinical Lipidology Attending Cardiologist  Direct Dial: [phone]  Fax: [phone]  Website:  [email or web address] 08/02/2022, 11:06 AM

## 2022-08-07 ENCOUNTER — Telehealth: Payer: Self-pay | Admitting: Family Medicine

## 2022-08-07 DIAGNOSIS — E1151 Type 2 diabetes mellitus with diabetic peripheral angiopathy without gangrene: Secondary | ICD-10-CM | POA: Diagnosis not present

## 2022-08-07 DIAGNOSIS — I69354 Hemiplegia and hemiparesis following cerebral infarction affecting left non-dominant side: Secondary | ICD-10-CM | POA: Diagnosis not present

## 2022-08-07 DIAGNOSIS — I890 Lymphedema, not elsewhere classified: Secondary | ICD-10-CM | POA: Diagnosis not present

## 2022-08-07 DIAGNOSIS — S80822D Blister (nonthermal), left lower leg, subsequent encounter: Secondary | ICD-10-CM | POA: Diagnosis not present

## 2022-08-07 DIAGNOSIS — I872 Venous insufficiency (chronic) (peripheral): Secondary | ICD-10-CM | POA: Diagnosis not present

## 2022-08-07 DIAGNOSIS — I10 Essential (primary) hypertension: Secondary | ICD-10-CM | POA: Diagnosis not present

## 2022-08-07 NOTE — Telephone Encounter (Signed)
Pt called to say [facility] is saying they still have not received the order for the leg wraps (Juxtalite)  Please advise.

## 2022-08-08 NOTE — Telephone Encounter (Signed)
Okay for leg wrap.

## 2022-08-09 ENCOUNTER — Telehealth: Payer: Self-pay | Admitting: Family Medicine

## 2022-08-09 NOTE — Telephone Encounter (Signed)
Jimmy Carr call from [facility] and stated she need a verbal order for compression wrap [name]'s # is [phone]

## 2022-08-12 NOTE — Telephone Encounter (Signed)
Called tina, no answer, left message on VM giving okay for VO per Dr Volanda Napoleon.

## 2022-08-13 ENCOUNTER — Encounter: Payer: Self-pay | Admitting: Family Medicine

## 2022-08-13 DIAGNOSIS — I890 Lymphedema, not elsewhere classified: Secondary | ICD-10-CM | POA: Insufficient documentation

## 2022-08-13 DIAGNOSIS — R600 Localized edema: Secondary | ICD-10-CM | POA: Diagnosis not present

## 2022-08-13 LAB — POCT RAPID STREP A (OFFICE): Rapid Strep A Screen: NEGATIVE

## 2022-08-15 DIAGNOSIS — I69354 Hemiplegia and hemiparesis following cerebral infarction affecting left non-dominant side: Secondary | ICD-10-CM | POA: Diagnosis not present

## 2022-08-15 DIAGNOSIS — I10 Essential (primary) hypertension: Secondary | ICD-10-CM | POA: Diagnosis not present

## 2022-08-15 DIAGNOSIS — I890 Lymphedema, not elsewhere classified: Secondary | ICD-10-CM | POA: Diagnosis not present

## 2022-08-15 DIAGNOSIS — I872 Venous insufficiency (chronic) (peripheral): Secondary | ICD-10-CM | POA: Diagnosis not present

## 2022-08-15 DIAGNOSIS — E1151 Type 2 diabetes mellitus with diabetic peripheral angiopathy without gangrene: Secondary | ICD-10-CM | POA: Diagnosis not present

## 2022-08-15 DIAGNOSIS — S80822D Blister (nonthermal), left lower leg, subsequent encounter: Secondary | ICD-10-CM | POA: Diagnosis not present

## 2022-08-16 LAB — BASIC METABOLIC PANEL
BUN/Creatinine Ratio: 19 (ref 10–24)
BUN: 23 mg/dL (ref 8–27)
CO2: 25 mmol/L (ref 20–29)
Calcium: 9.4 mg/dL (ref 8.6–10.2)
Chloride: 97 mmol/L (ref 96–106)
Creatinine, Ser: 1.19 mg/dL (ref 0.76–1.27)
Glucose: 149 mg/dL — ABNORMAL HIGH (ref 70–99)
Potassium: 4 mmol/L (ref 3.5–5.2)
Sodium: 137 mmol/L (ref 134–144)
eGFR: 62 mL/min/{1.73_m2} (ref >59)

## 2022-08-16 LAB — BRAIN NATRIURETIC PEPTIDE: BNP: 105.9 pg/mL — ABNORMAL HIGH (ref 0.0–100.0)

## 2022-08-17 ENCOUNTER — Encounter: Payer: Self-pay | Admitting: Internal Medicine

## 2022-08-19 ENCOUNTER — Other Ambulatory Visit: Payer: Self-pay | Admitting: Family Medicine

## 2022-08-21 ENCOUNTER — Telehealth (INDEPENDENT_AMBULATORY_CARE_PROVIDER_SITE_OTHER): Payer: Medicare Other | Admitting: Family Medicine

## 2022-08-21 ENCOUNTER — Telehealth: Payer: Self-pay | Admitting: Family Medicine

## 2022-08-21 DIAGNOSIS — Z794 Long term (current) use of insulin: Secondary | ICD-10-CM | POA: Diagnosis not present

## 2022-08-21 DIAGNOSIS — E1140 Type 2 diabetes mellitus with diabetic neuropathy, unspecified: Secondary | ICD-10-CM

## 2022-08-21 LAB — POCT GLYCOSYLATED HEMOGLOBIN (HGB A1C): Hemoglobin A1C: 6.7 % — AB (ref 4.0–5.6)

## 2022-08-21 NOTE — Telephone Encounter (Signed)
Refill was sent in this morning, called and spoke with pharmacy, refill was received and processed.

## 2022-08-21 NOTE — Telephone Encounter (Signed)
Pt called and stated Pharm won't refill glucose blood (PRODIGY NO CODING BLOOD GLUC) test strip without Dr. Christie Beckers. Pt wanted to see if Dr. can call in the refill this morning so he can receive them since he is completely out.   Also, informed pt that he had a visit with Dr. today and that he needed to mentioned that refill during that visit as well.   Please advise

## 2022-08-21 NOTE — Progress Notes (Unsigned)
Virtual Visit via Telephone Note  I connected with Tressia Danas on 08/21/22 at  3:30 PM EST by telephone and verified that I am speaking with the correct person using two identifiers.   I discussed the limitations, risks, security and privacy concerns of performing an evaluation and management service by telephone and the availability of in person appointments. I also discussed with the patient that there may be a patient responsible charge related to this service. The patient expressed understanding and agreed to proceed.  Location patient: home Location provider: work or home office Participants present for the call: patient, provider Patient did not have a visit in the prior 7 days to address this/these issue(s).  Chief Complaint  Patient presents with   Medication Reaction    Lasix doubled to 40 mg daily, blood sugar spiked some, over 200. Worsening pain with spasms in legs Stopped lantus a year ago, wants to know if he can restart it at bed if sugars are still spiked .  Robaxin is not helping as much as it was      History of Present Illness: Pt with elevated bs since Cardiologist increased lasix to 40 mg.  BS has been around 200s.  Taking humalog 50 units in am, 35 units with lunch, and 50 units with dinner, metformin 1000 mg BID. The 200 bs were fasting first thing in am.  This am bs was 120.  Pt notes some nights he has a snack of nab crackers with peanut butter at midnight.  Does not recall if those are the same mornings that he noticed the elevated blood sugars.  Pt inquires if he needs to restart lantus 60 units.  Pt with increased muscle cramps, 8-9/10 pain.  Hgb A1C 6.7% last visit, 08/01/22.   Observations/Objective: Patient sounds cheerful and well on the phone. I do not appreciate any SOB. Speech and thought processing are grossly intact. Patient reported vitals:  Assessment and Plan: Type 2 diabetes mellitus with diabetic neuropathy, with long-term current use  of insulin ([place]) -hgb A1C 6.7% on 08/01/2022 -Patient advised to take note if elevation in a.m. blood sugars is on mornings he ate crackers as a late night snack. -Less likely Lasix is causing elevation in blood sugars as previously on diuretics without issue. -For continued elevation  Follow Up Instructions:  DIAGMED@   [id number] 5-10 [id number] 11-20 [id number] 21-30 I did not refer this patient for an OV in the next 24 hours for this/these issue(s).  I discussed the assessment and treatment plan with the patient. The patient was provided an opportunity to ask questions and all were answered. The patient agreed with the plan and demonstrated an understanding of the instructions.   The patient was advised to call back or seek an in-person evaluation if the symptoms worsen or if the condition fails to improve as anticipated.  I provided 17:14 minutes of non-face-to-face time during this encounter.   [name], MD

## 2022-08-23 DIAGNOSIS — I10 Essential (primary) hypertension: Secondary | ICD-10-CM | POA: Diagnosis not present

## 2022-08-23 DIAGNOSIS — I872 Venous insufficiency (chronic) (peripheral): Secondary | ICD-10-CM | POA: Diagnosis not present

## 2022-08-23 DIAGNOSIS — E1151 Type 2 diabetes mellitus with diabetic peripheral angiopathy without gangrene: Secondary | ICD-10-CM | POA: Diagnosis not present

## 2022-08-23 DIAGNOSIS — I890 Lymphedema, not elsewhere classified: Secondary | ICD-10-CM | POA: Diagnosis not present

## 2022-08-23 DIAGNOSIS — S80822D Blister (nonthermal), left lower leg, subsequent encounter: Secondary | ICD-10-CM | POA: Diagnosis not present

## 2022-08-23 DIAGNOSIS — I69354 Hemiplegia and hemiparesis following cerebral infarction affecting left non-dominant side: Secondary | ICD-10-CM | POA: Diagnosis not present

## 2022-08-29 ENCOUNTER — Telehealth: Payer: Self-pay | Admitting: Family Medicine

## 2022-08-29 NOTE — Telephone Encounter (Signed)
Pt called to request the following medical supplies:  Medical gauze (11.4 by 4.1) Abdominal pads (5x9)  For swollen legs - Edema  Preferably from [place]  [place], [place]

## 2022-09-01 IMAGING — DX DG KNEE COMPLETE 4+V*L*
4 series · 4 of 4 positions shown · non-contrast
Comparison: None.

CLINICAL DATA: Fall, left knee pain

EXAM:
LEFT KNEE - COMPLETE 4+ VIEW

[knee ap]
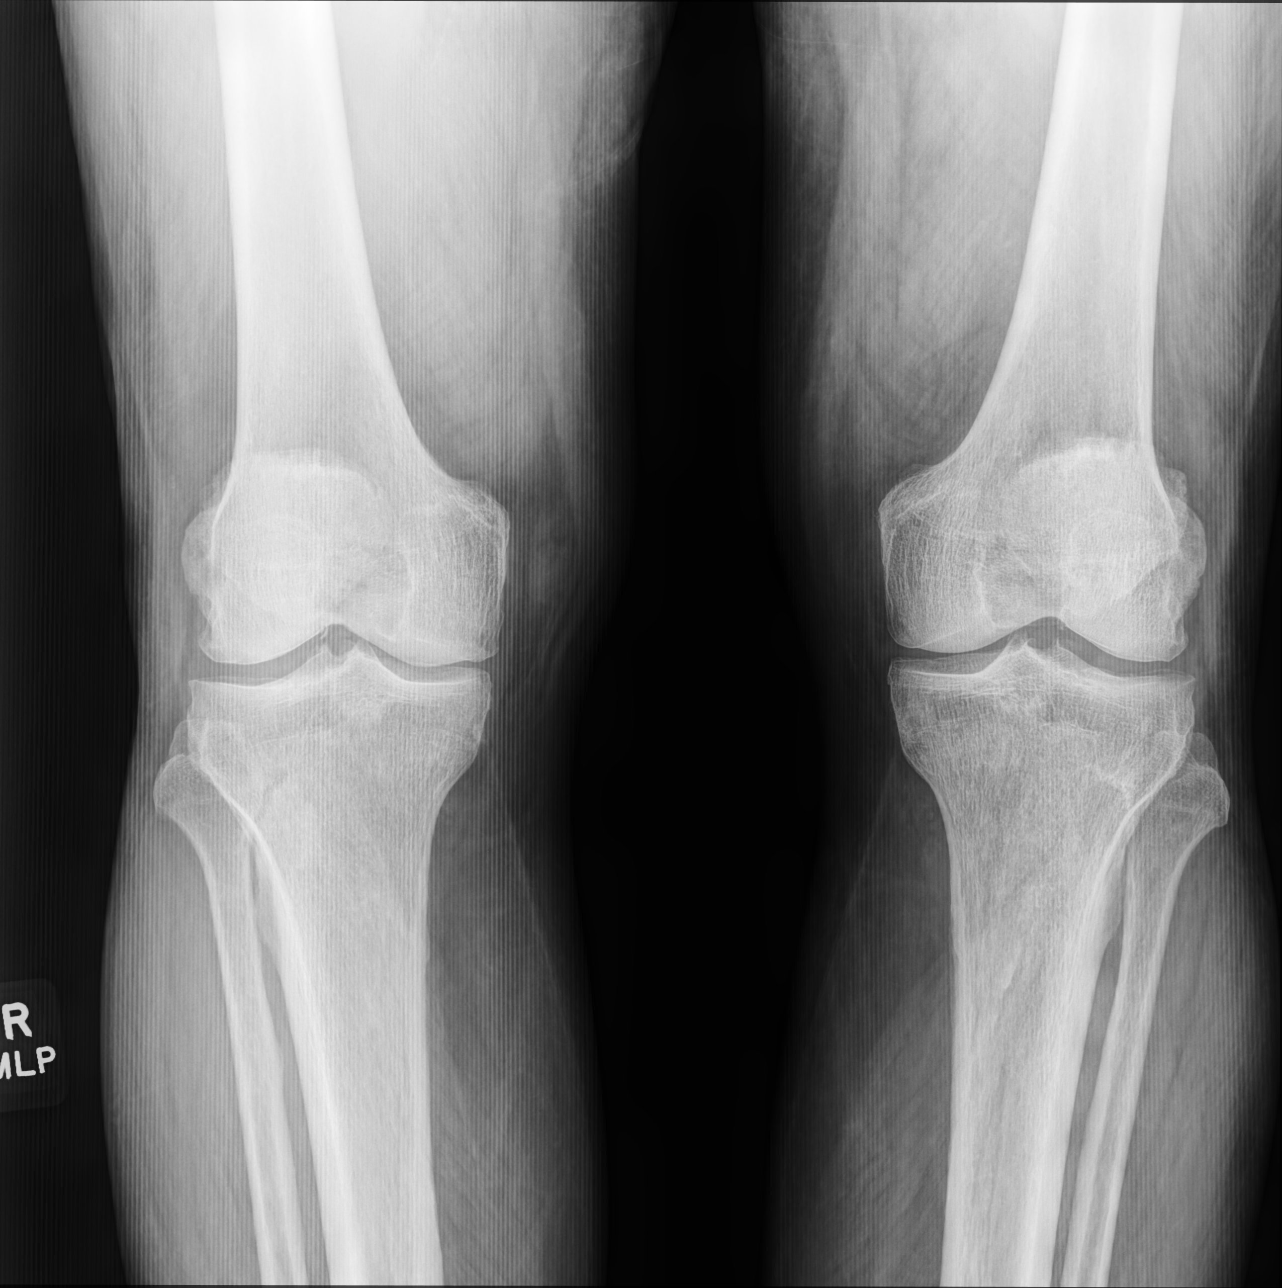

[knee [person_name] view pa]
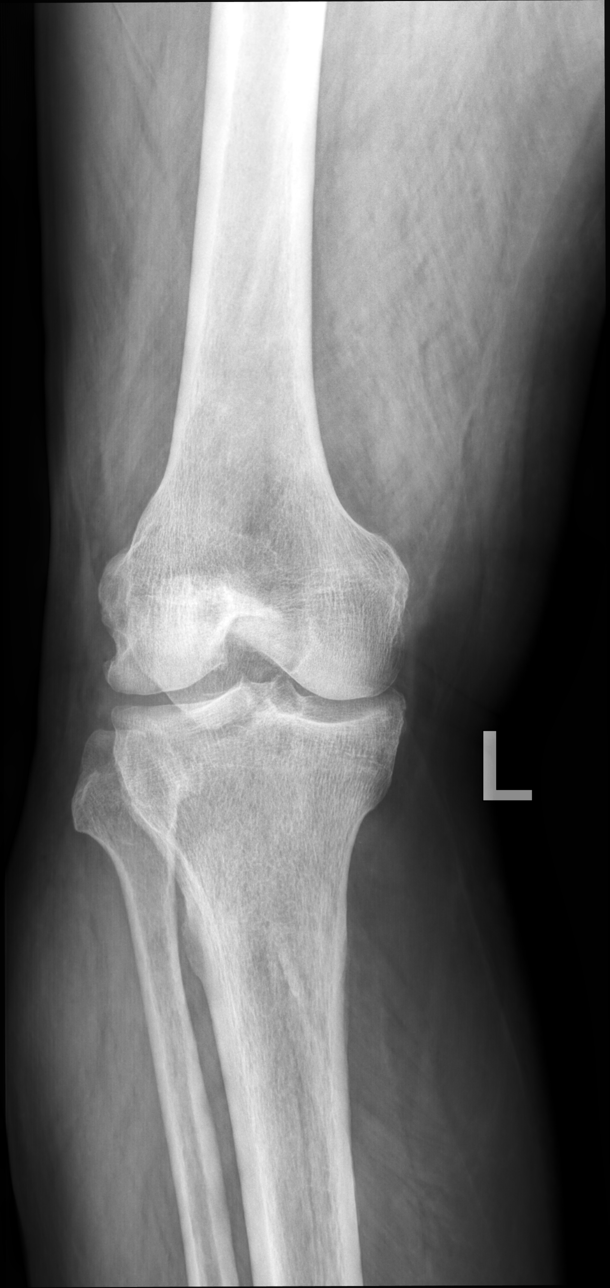

[patella (sunrise) tan]
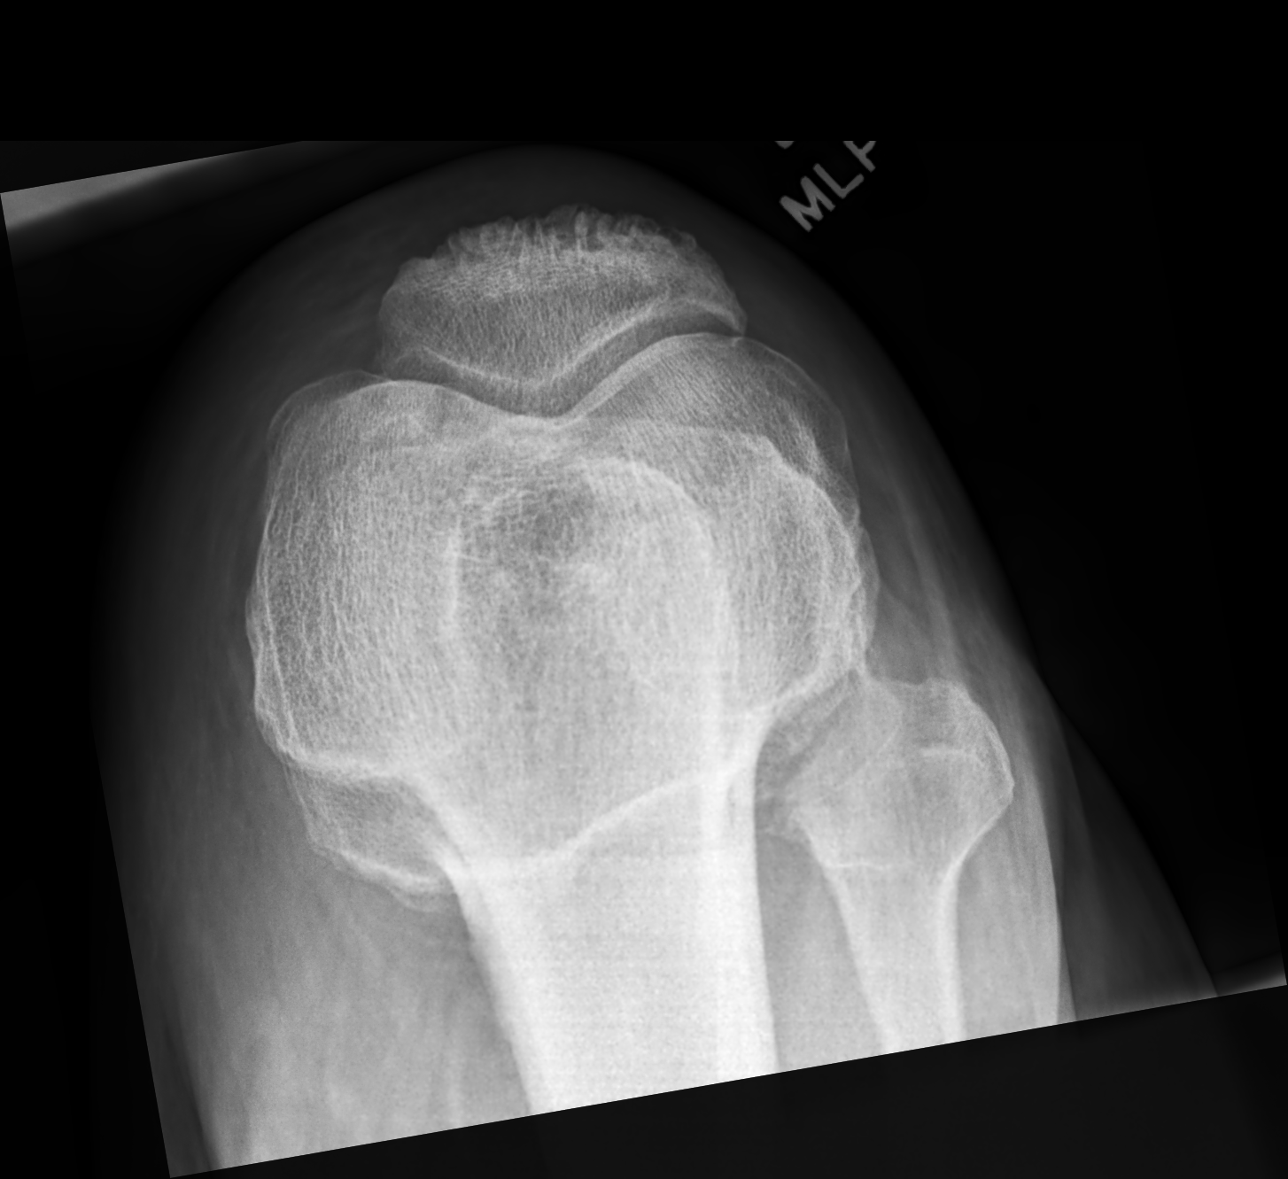

[knee lat]
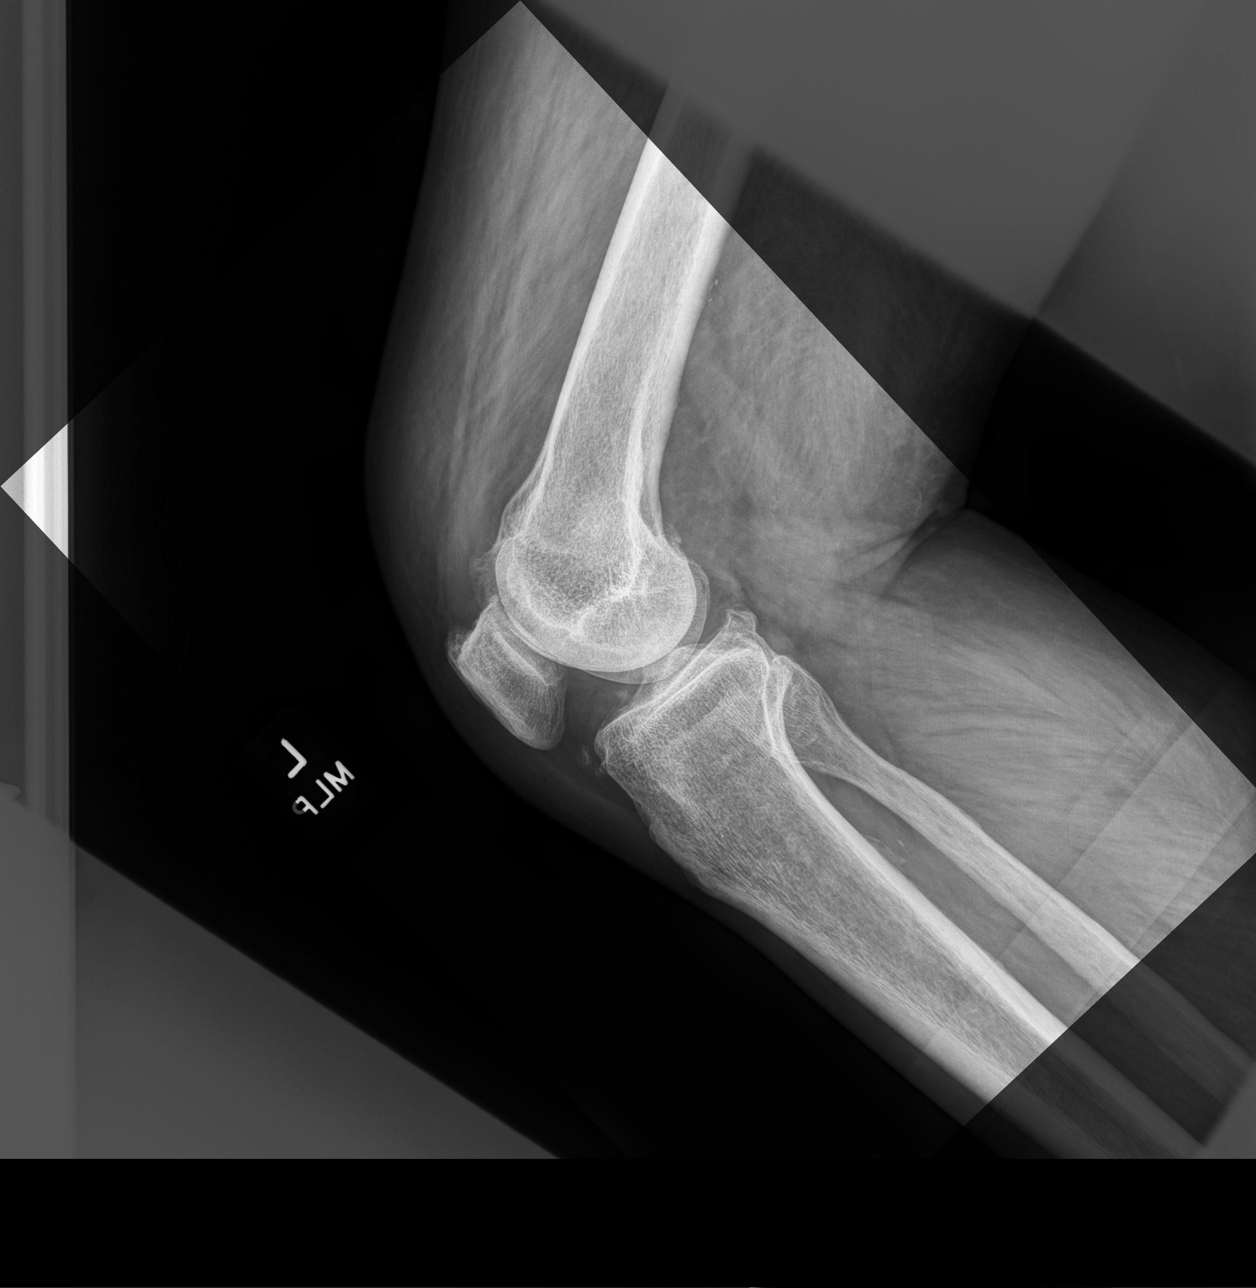

[4 of 4 positions shown; findings below may reference images not displayed]

FINDINGS: No fracture or dislocation is seen.

Mild degenerative changes with sharpening of the tibial spines
bilaterally and patellofemoral joint space narrowing on the left.

Visualized soft tissues are within normal limits.

No suprapatellar knee joint effusion.
IMPRESSION: No fracture or dislocation is seen.

Mild degenerative changes.

## 2022-09-05 ENCOUNTER — Encounter: Payer: Self-pay | Admitting: Family Medicine

## 2022-09-05 NOTE — Telephone Encounter (Signed)
Ok

## 2022-09-06 ENCOUNTER — Encounter: Payer: Self-pay | Admitting: Internal Medicine

## 2022-09-06 ENCOUNTER — Ambulatory Visit: Payer: Medicare Other | Attending: Internal Medicine | Admitting: Internal Medicine

## 2022-09-06 VITALS — BP 124/79 | HR 87 | Ht 72.0 in | Wt 256.0 lb

## 2022-09-06 DIAGNOSIS — I2510 Atherosclerotic heart disease of native coronary artery without angina pectoris: Secondary | ICD-10-CM

## 2022-09-06 DIAGNOSIS — R600 Localized edema: Secondary | ICD-10-CM | POA: Insufficient documentation

## 2022-09-06 DIAGNOSIS — I10 Essential (primary) hypertension: Secondary | ICD-10-CM | POA: Diagnosis not present

## 2022-09-06 NOTE — Progress Notes (Signed)
OFFICE NOTE  Chief Complaint:  Follow-up  Primary Care Physician: [name], MD  HPI:  Jimmy Carr is a 81 year-old male with a history of PAF. His atrial fib actually started in the mid 80s. He was on Coumadin, beta blockers, and Lanoxin, and over the years, we have been able to wean him off his beta blockers. He is still on a low dose of Lanoxin and several years ago, we stopped the Coumadin, and his CHADS score at that time was 2, but with no atrial fibrillation in over 10 years, we stopped the Coumadin. He is on aspirin. He has had no recurrence of any atrial fib in over 15 years. He has no palpitations. No unusual shortness of breath. No chest pain. No exertional dyspnea. Minimal puffiness of his ankles at the end of the day. No dizziness, slurred speech, or blurred vision.  He is diabetic and has been so for over 30 years. His last hemoglobin A1c was 7.2. He now has a peripheral neuropathy that involves his feet, much greater than his hands. He had been tried on Neurontin with no real help. He checks his blood pressure at home. It has been under good control. He denies arthralgias or myalgias. He walks daily for at least 30 minutes, five to seven days a week. With this, he has no claudication, no unusual breathlessness. He has had no angina. He has diffuse T wave inversion on his EKG. He was catheterized in December 2009 that showed minimal coronary disease and normal LV function. He has no real complaints today, other than the increased cost of digoxin. Exactly unclear to me why he is on digoxin, as this medicine is unlikely to keep him out of atrial fibrillation. He does have a high CHADS2 score which is 4, despite in frequent atrial fibrillation. He continues on aspirin.  There are no new complaints of chest pain or worsening shortness of breath.  Jimmy Carr recently saw [name] in the office for what he thought was atypical chest pain. He recommended a nonsteroidal. He is  also reported more recent palpitations. He's been under significant stress recently. Today he called the office several times and spoke with our triage nurse about concern for atrial fibrillation. I did advise him that he may need to come in for an EKG to see if is actually in atrial fibrillation. Ultimately he did decide to do that.  Jimmy Carr returns today and is doing well. He is asymptomatic and has no more palpitations. We discussed to monitor his last visit if his symptoms did not improve but they seem to have improved. Overall he feels like he did well. He denies any chest pain. He reports good blood sugar control with his diabetes.  I saw Jimmy Carr back today in the office for follow-up. Overall he seems to be doing fairly well. He had recent laboratory work which shows an improvement in his lipid profile, however triglycerides remain elevated over 250. This is likely related to poorly controlled diabetes with an A1c of 7.8. He needs to continue to work on weight loss, exercise and reducing carbohydrates. He has not had any recurrent atrial fibrillation in over 15 years. Despite an elevated CHADSVASC score due to his low burden of A. fib he is only on aspirin therapy.  03/20/2017  Jimmy Carr returns today for follow-up. He denies any recurrent palpitations. He occasionally gets some lightheadedness. Recently he's had a feeling of discomfort in his stomach that  goes up to his chest. He says he feels hollow. He gets some lightheadedness and pain in the back of his neck. He describes it not like a headache but more of an aching sensation. He denies any exertional chest pain but has noticed more fatigue and dyspnea. EKG does not show atrial fibrillation today. He denies any hypoglycemia.  04/21/2018  Jimmy Carr returns today for follow-up.  Overall he is without complaints.  When I last saw him he was having some upper mid epigastric to lower central chest pain.  Ordered a stress test and a repeat  monitor for concern for possible A. fib.  He says his symptoms resolved spontaneously and he had neither test performed.  He says he is done well since that without any recurrent palpitations or chest pain.  In general he has had really good control over his blood sugars.  Recent hemoglobin A1c is 7 however on insulin and metformin.  Blood pressure is also well controlled 122/72.  LDL-C less than 70 on lovastatin.  EKG personally reviewed today shows sinus rhythm with nonspecific T wave changes at 76.  08/18/2019  Jimmy Carr is seen today as an add-on.  He was seen 2 days ago at [facility] in [place].  He had presented with symptoms concerning for TIA.  Cerebral imaging was performed including an echo which showed normal EF and negative bubble study.  He was noted on monitoring to have some brief episodes of irregular rhythm concerning for A. fib.  He does have a remote history of A. fib in the past.  It was felt this is the likely cause of his symptoms and the recommendation was to switch him to Eliquis.  In addition, he did have a lipid profile which showed a total cholesterol 114 and LDL 54.  A high potency statin was recommended and it was advised to switch to a atorvastatin from lovastatin.  04/06/2020  Jimmy Carr is seen today in follow-up.  He denies any recurrent TIAs.  Unfortunately after demonstrating what was felt to be atrial fibrillation on imaging studies I had recommended starting anticoagulation with Eliquis.  He decided that he was concerned too much about the risks of bleeding on that and is not currently taking it.  He remains only on low-dose aspirin.  He understands that I feel he is at increased risk for stroke and that his TIA may have been related to A. fib.  EKG today however does not show any atrial fibrillation.  08/08/2020  Jimmy Carr is seen today in follow-up.  He was just seen in the emergency department [facility] 2 days ago with paroxysmal atrial fibrillation.  He noted when  checking blood pressure and heart rate that his heart rate was elevated and irregular.  Otherwise he was not aware of any symptomatic A. fib.  According to the notes on presentation heart rate was in the 80s.  Blood pressure was also elevated at 282.  He was instructed to follow-up with me to discuss anticoagulation again.  Today we had a long discussion about anticoagulation and the risks and benefits of it.  He has concerns about the bleeding risks however understands that given his prior TIA, diabetes, hypertension and other risk factors that he has at high risk for stroke with atrial fibrillation.  He is again noted to be in A. fib today.  His CHA2DS2-VASc score is at least 6.  We again discussed anticoagulation with Eliquis and he is agreeable to proceed.  08/02/2022  Mr.  Carr returns today for follow-up.  He still struggling with significant lower extremity edema.  He been transition from hydrochlorothiazide over to Lasix 20 mg daily however he has had significant swelling.  He has bilateral Ace wraps in place however they started his ankle up to his shins and he has notable pedal edema.  He has a open wound that was bleeding on the left dorsum of his foot.  He was previously seen by Sande Rives who had stopped his metoprolol and switch him to carvedilol.  He is only on 3.125 twice a day.  Blood pressure was notable for diastolic hypertension with a blood pressure of 485 on the diastolic side.  He recently saw his PCP who recommended a pulse treatment with diuretics however I do not think that is going to solve a long-term swelling issue.  He really needs to be on higher doses of diuretic all the time.  09/06/2022  Jimmy Carr returns today for 1 month follow-up.  He has had significant improvement in his swelling but remains volume overloaded.  He is her weight has gone down from 2 65-2 56 on higher dose Lasix.  Repeat labs showed mildly elevated BNP now at 105 with a stable creatinine at 1.19.  He  continues to have lower extremity wrappings.  They are working to fit him with compression stockings when his sores are healed.  He reports his breathing is pretty good and his energy level is improved.  PMHx:  Past Medical History:  Diagnosis Date   AF (atrial fibrillation) ([place])    2D ECHO, 03/01/1987 - normal; NUCLEAR STRESS TEST, 04/10/2006 - EF 65%, no ischemia   Bruit    CAROTID DOPPLER, 09/20/2008 - normal, no evidence of diameter reduction, significant tortuousity or any other vascular abnormality   Cerebrovascular accident [facility]) 2003   history of small vessal lacunar stroke 2003   Diabetes mellitus type II    Glaucoma    Hyperlipidemia    Hypertension    Other specified cardiac dysrhythmias(427.89)    PSVT   Peripheral neuropathy     Past Surgical History:  Procedure Laterality Date   ANAL FISSURE REPAIR     CARDIAC CATHETERIZATION  02/02/2001   Normal LV systolic function, questionable mitral valve prolapse without mitral regurgitation, no evidence of CAD   CARDIAC CATHETERIZATION  09/13/2008   Minimal CAD, normal LV systolic function, no evidence of renal artery stenosis, distal aortic disease, or iliac disease    FAMHx:  Family History  Problem Relation Age of Onset   Diabetes Neg Hx    Colon cancer Neg Hx     SOCHx:   reports that he has never smoked. He has never used smokeless tobacco. He reports that he does not drink alcohol and does not use drugs.  ALLERGIES:  Allergies  Allergen Reactions   Cheratussin Ac [Guaifenesin-Codeine] Nausea And Vomiting   Verapamil     ROS: Pertinent items noted in HPI and remainder of comprehensive ROS otherwise negative.  HOME MEDS: Current Outpatient Medications  Medication Sig Dispense Refill   B-D ULTRAFINE III SHORT PEN 31G X 8 MM MISC USE FOUR TIMES DAILY BEFORE MEALS AND AT BEDTIME 200 each 5   Blood Glucose Monitoring Suppl (ACCU-CHEK AVIVA PLUS) w/Device KIT USED TO CHECK BLOOD SUGAR 3 TIMES DAILY OR PRN. 1 kit 0    carvedilol (COREG) 3.125 MG tablet Take 1 tablet (3.125 mg total) by mouth 2 (two) times daily. 180 tablet 3   furosemide (LASIX) 40  MG tablet Take 1 tablet (40 mg total) by mouth daily. 90 tablet 3   gabapentin (NEURONTIN) 100 MG capsule TAKE 1 CAPSULE(100 MG) BY MOUTH AT BEDTIME 90 capsule 1   glucose blood (PRODIGY NO CODING BLOOD GLUC) test strip USE THREE TIMES DAILY AS DIRECTED 100 strip 2   insulin lispro (HUMALOG) 100 UNIT/ML KwikPen INJECT 50 UNITS UNDER THE SKIN AT BREAKFAST, 35 UNITS WITH LUNCH, AND 50 UNITS WITH SUPPER 123 mL 0   LANTUS SOLOSTAR 100 UNIT/ML Solostar Pen ADMINISTER 60 UNITS UNDER THE SKIN AT 10 PM 15 mL 2   latanoprost (XALATAN) 0.005 % ophthalmic solution 1 drop daily.      lisinopril (ZESTRIL) 40 MG tablet TAKE 1 TABLET(40 MG) BY MOUTH DAILY 90 tablet 2   lovastatin (MEVACOR) 40 MG tablet TAKE 1 TABLET(40 MG) BY MOUTH AT BEDTIME 90 tablet 3   metFORMIN (GLUCOPHAGE) 1000 MG tablet TAKE 1 TABLET(1000 MG) BY MOUTH TWICE DAILY WITH A MEAL 180 tablet 1   methocarbamol (ROBAXIN) 500 MG tablet TAKE 1 TABLET(500 MG) BY MOUTH FOUR TIMES DAILY 360 tablet 1   NYSTATIN powder APPLY TOPICALLY THREE TIMES DAILY 15 g 0   potassium chloride SA (KLOR-CON M) 20 MEQ tablet TAKE 1 TABLET BY MOUTH AS NEEDED WHEN LASIX IS TAKEN 30 tablet 3   Prodigy Twist Top Lancets 28G MISC USE TO TEST BLOOD SUGAR THREE TIMES DAILY AS DIRECTED 100 each 2   rOPINIRole (REQUIP) 1 MG tablet TAKE 1 TABLET BY MOUTH THREE TIMES DAILY 270 tablet 1   timolol (TIMOPTIC-XR) 0.5 % ophthalmic gel-forming Place 1 drop into both eyes daily.      XARELTO 20 MG TABS tablet TAKE 1 TABLET BY MOUTH EVERY EVENING 90 tablet 1   Current Facility-Administered Medications  Medication Dose Route Frequency Provider Last Rate Last Admin   0.9 %  sodium chloride infusion  500 mL Intravenous Continuous Ladene Artist, MD        LABS/IMAGING: No results found for this or any previous visit (from the past 48 hour(s)). No  results found.  VITALS: BP 124/79   Pulse 87   Ht 6' (1.829 m)   Wt 256 lb (116.1 kg)   SpO2 93%   BMI 34.72 kg/m   EXAM: General appearance: alert, no distress, and moderately obese Neck: no carotid bruit, no JVD, and thyroid not enlarged, symmetric, no tenderness/mass/nodules Lungs: clear to auscultation bilaterally Heart: irregularly irregular rhythm Abdomen: soft, non-tender; bowel sounds normal; no masses,  no organomegaly and obese Extremities: 2+ edema, ace wraps from ankle to shin, L foot small bleeding wound over dorsum Pulses: 2+ and symmetric Skin: Skin color, texture, turgor normal. No rashes or lesions Neurologic: Grossly normal  EKG: Deferred  ASSESSMENT: Persistent atrial fibrillation, CHADSVASC score of 6 Recent TIA Hypertension History of stroke, with right central vision loss Type 2 diabetes on insulin Dyslipidemia Sphenoid mass Bilateral lower extremity edema-?  Right heart failure  PLAN: 1.   Jimmy Carr has had improvement in his lower extremity edema on a higher dose diuretic.  His BNP is only minimally elevated with stable renal function.  I will continue his current dose of diuretic.  I like to get an echo to evaluate particular the right heart as well as his filling pressures.  Would continue with lower extremity wraps or compression stockings.  Follow-up in 3 months or sooner as necessary.  Pixie Casino, MD, [facility] [place] Director of  the Advanced Lipid Disorders &  Cardiovascular Risk Reduction Clinic Diplomate of the American Board of Clinical Lipidology Attending Cardiologist  Direct Dial: [phone]  Fax: [phone]  Website:  [email or web address] 09/06/2022, 10:48 AM

## 2022-09-06 NOTE — Patient Instructions (Addendum)
Medication Instructions:   CONTINUE current medications  *If you need a refill on your cardiac medications before your next appointment, please call your pharmacy*   Testing/Procedures: Your physician has requested that you have an echocardiogram. Echocardiography is a painless test that uses sound waves to create images of your heart. It provides your doctor with information about the size and shape of your heart and how well your heart's chambers and valves are working. This procedure takes approximately one hour. There are no restrictions for this procedure. Please do NOT wear cologne, perfume, aftershave, or lotions (deodorant is allowed). Please arrive 15 minutes prior to your appointment time.    Follow-Up: At [facility], you and your health needs are our priority.  As part of our continuing mission to provide you with exceptional heart care, we have created designated Provider Care Teams.  These Care Teams include your primary Cardiologist (physician) and Advanced Practice Providers (APPs -  Physician Assistants and Nurse Practitioners) who all work together to provide you with the care you need, when you need it.  We recommend signing up for the patient portal called "MyChart".  Sign up information is provided on this After Visit Summary.  MyChart is used to connect with patients for Virtual Visits (Telemedicine).  Patients are able to view lab/test results, encounter notes, upcoming appointments, etc.  Non-urgent messages can be sent to your provider as well.   To learn more about what you can do with MyChart, go to [email or web address].    Your next appointment:    3-4 months with Dr. Debara Pickett or NP/PA

## 2022-09-09 ENCOUNTER — Telehealth: Payer: Self-pay | Admitting: Family Medicine

## 2022-09-09 DIAGNOSIS — R070 Pain in throat: Secondary | ICD-10-CM

## 2022-09-09 MED ORDER — "CURITY ABDOMINAL 5""X9"" PADS": 1.0000 | MEDICATED_PAD | 0 refills | Status: AC

## 2022-09-09 MED ORDER — "BULKEE II GAUZE 4.5""X4.1YD MISC": 1.0000 | 6 refills | Status: AC

## 2022-09-09 NOTE — Telephone Encounter (Signed)
Order was placed and faxed to [facility]. Received a confirmation.

## 2022-09-09 NOTE — Telephone Encounter (Signed)
Pt is calling and would a referral to ENT for his throat pain

## 2022-09-09 NOTE — Addendum Note (Signed)
Addended by: Encarnacion Slates on: 09/09/2022 10:48 AM   Modules accepted: Orders

## 2022-09-11 NOTE — Telephone Encounter (Signed)
[place] for referral to ENT.

## 2022-09-12 NOTE — Telephone Encounter (Signed)
A referral is sent.  

## 2022-09-18 ENCOUNTER — Other Ambulatory Visit: Payer: Self-pay | Admitting: Family Medicine

## 2022-09-22 IMAGING — DX DG CHEST 2V
2 series · 2 of 2 positions shown · non-contrast
Comparison: 01/31/2016

CLINICAL DATA: Irregular heart rate 2 days ago. History of atrial
fibrillation.

EXAM:
CHEST - 2 VIEW

[chest pa]
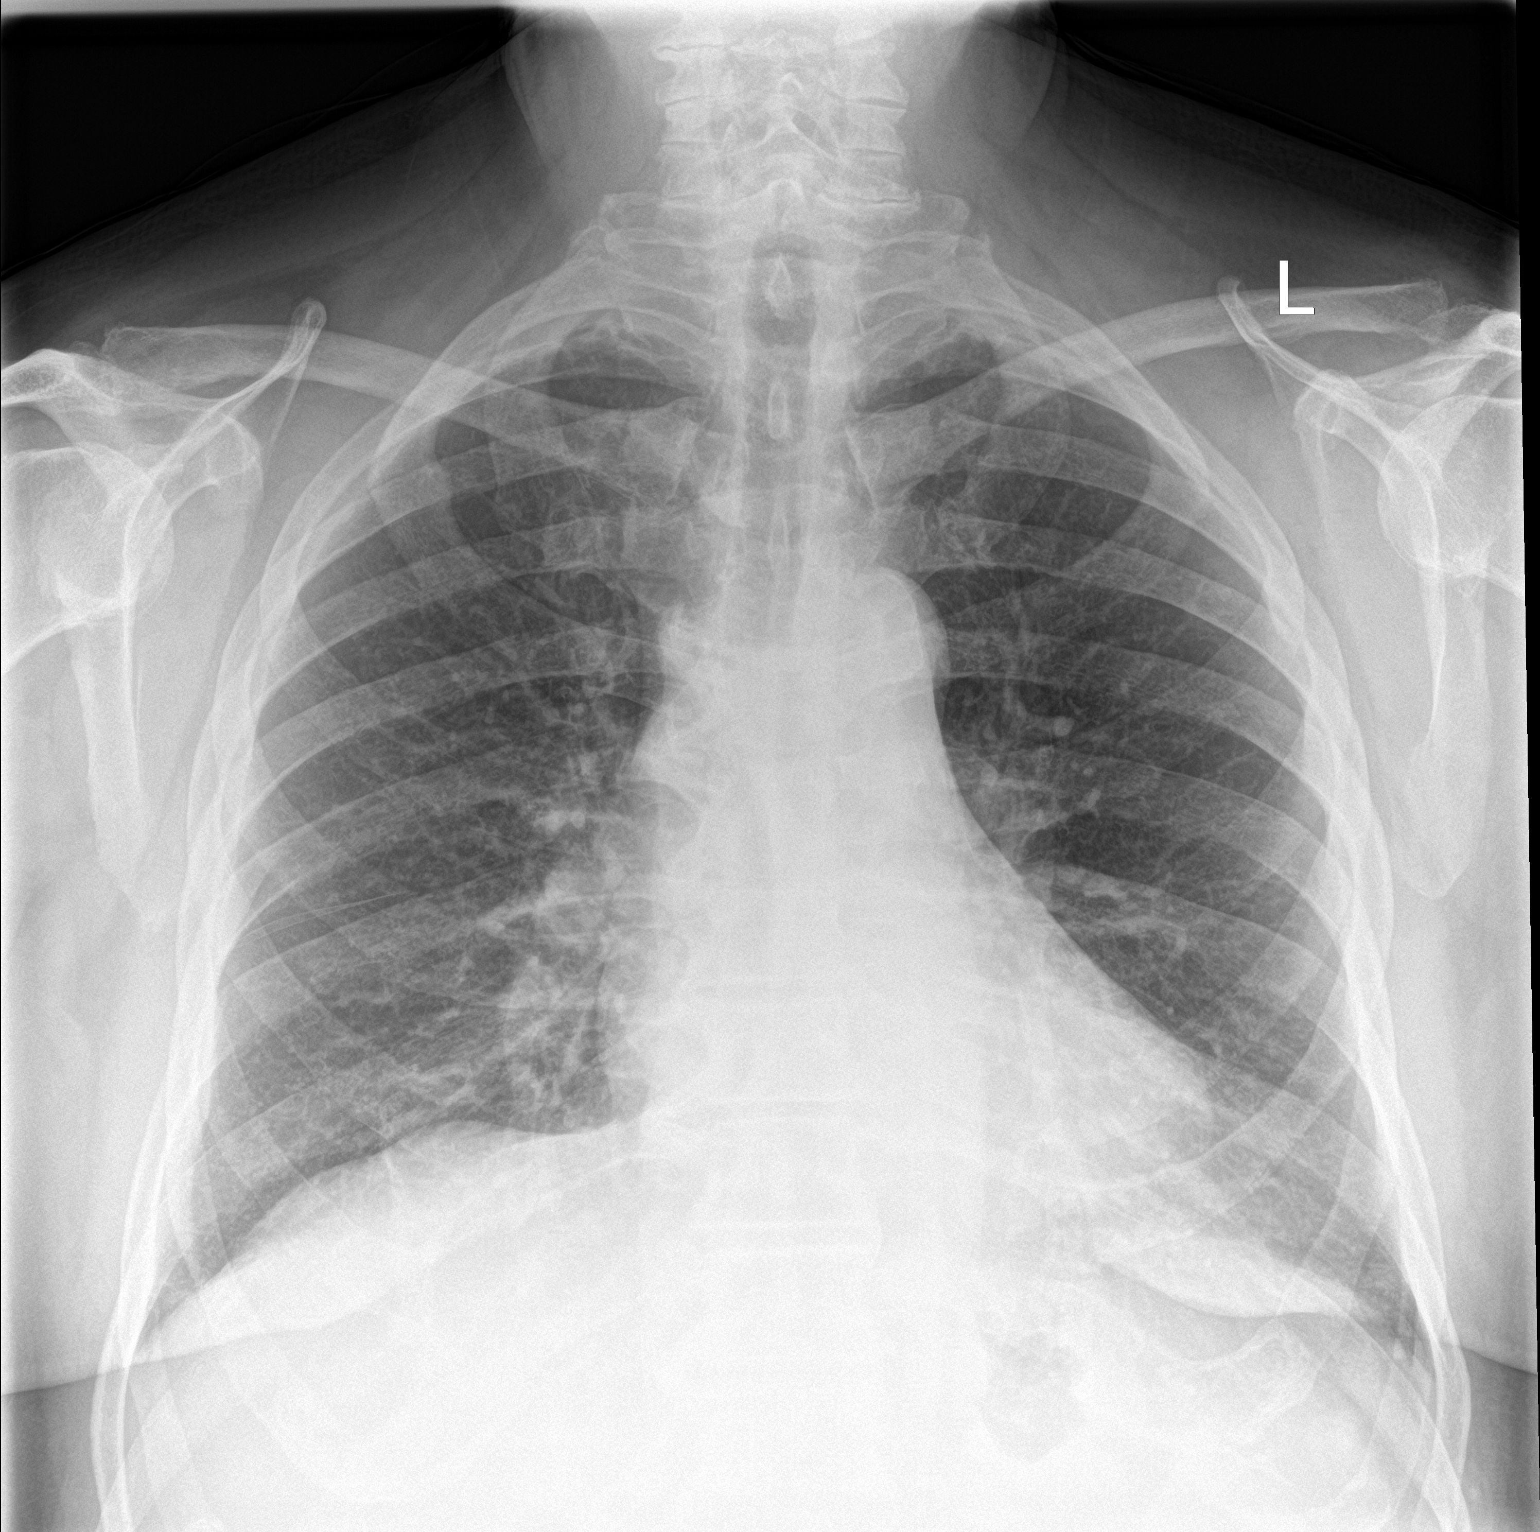

[chest lat]
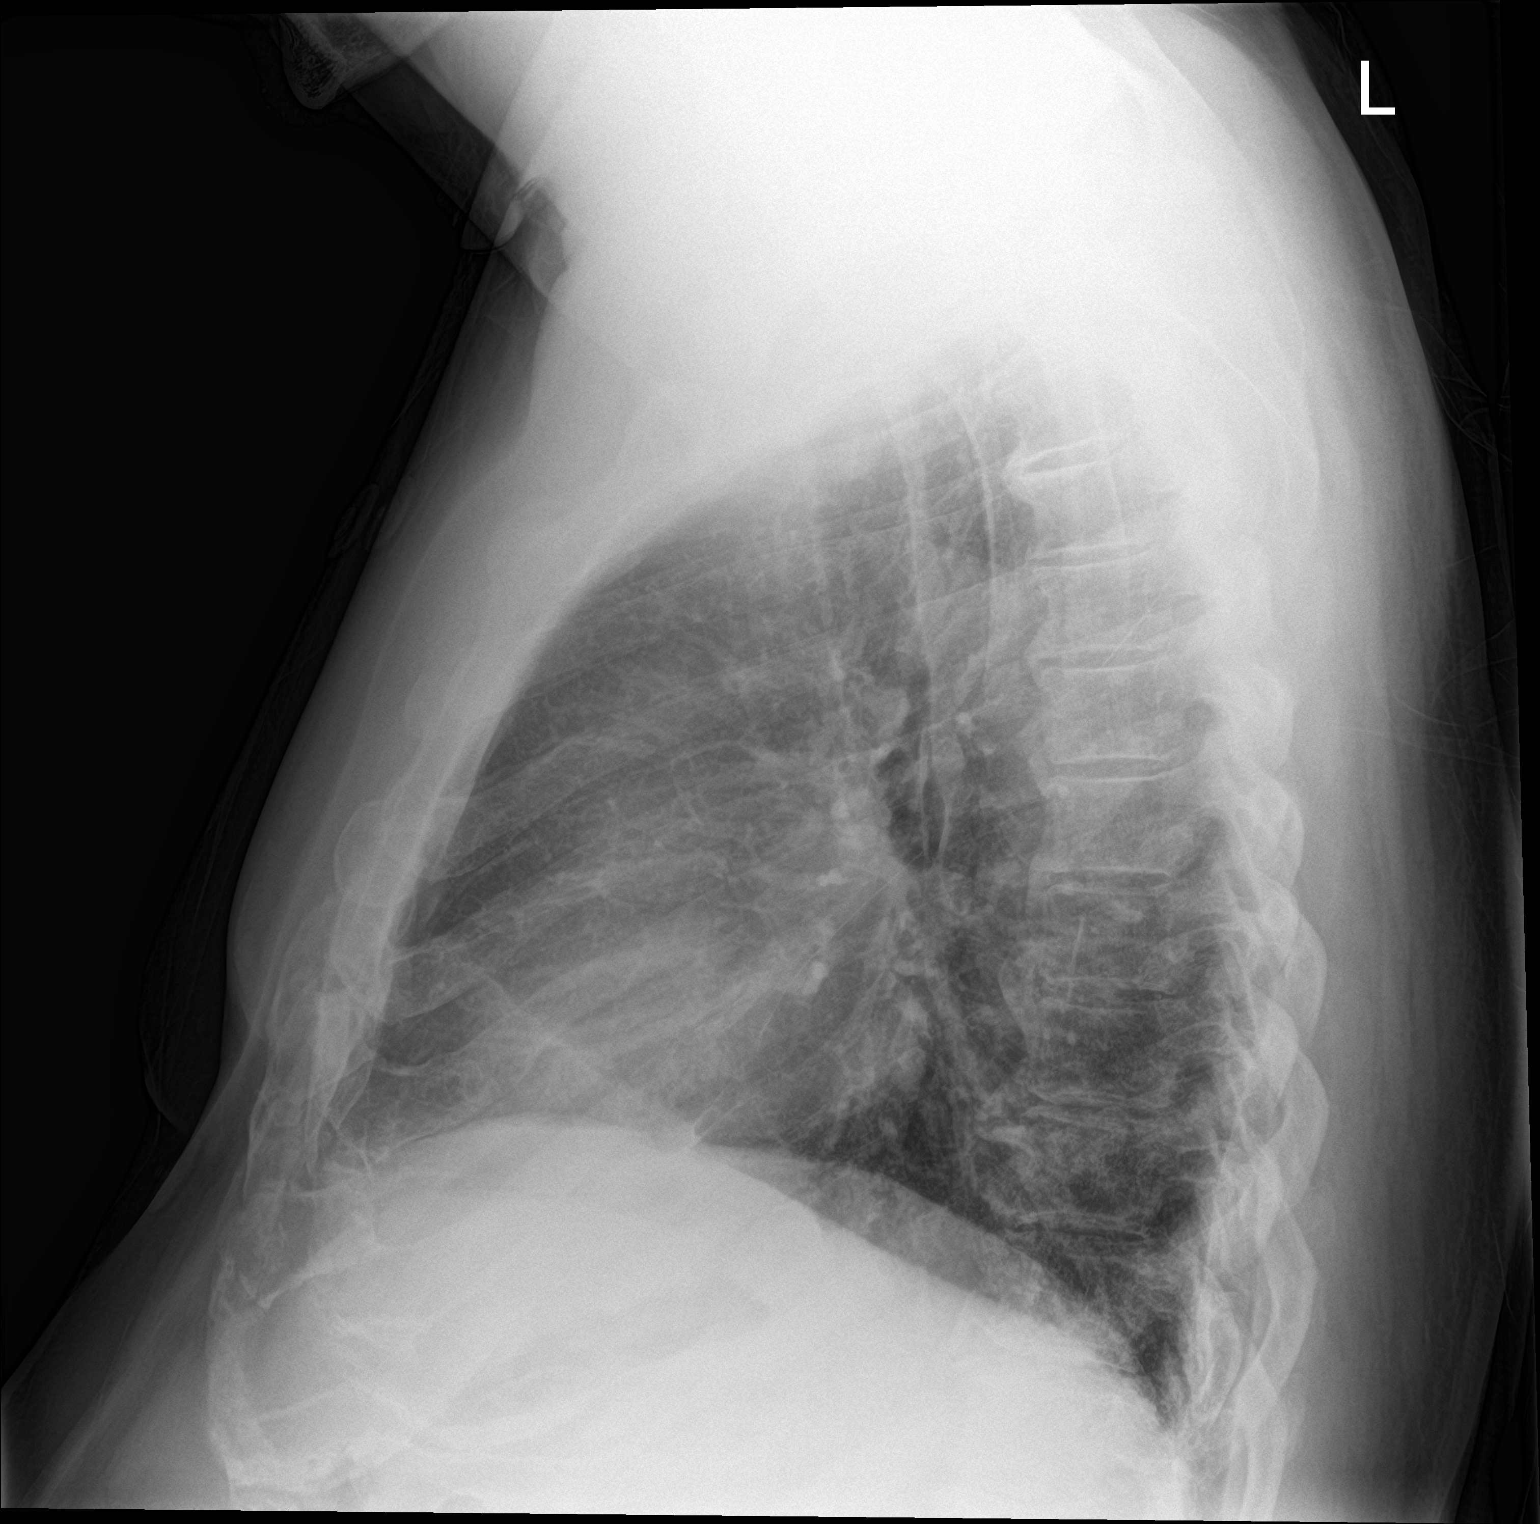

[2 of 2 positions shown; findings below may reference images not displayed]

FINDINGS: Normal sized heart. Clear lungs with normal vascularity. Thoracic
spine degenerative changes.
IMPRESSION: No acute abnormality.

## 2022-10-07 ENCOUNTER — Other Ambulatory Visit: Payer: Self-pay | Admitting: Family Medicine

## 2022-10-07 DIAGNOSIS — E7849 Other hyperlipidemia: Secondary | ICD-10-CM

## 2022-10-08 NOTE — Telephone Encounter (Signed)
error 

## 2022-10-10 ENCOUNTER — Ambulatory Visit (HOSPITAL_COMMUNITY): Payer: Medicare Other | Attending: Internal Medicine

## 2022-10-10 DIAGNOSIS — R600 Localized edema: Secondary | ICD-10-CM | POA: Diagnosis not present

## 2022-10-10 LAB — ECHOCARDIOGRAM COMPLETE
Area-P 1/2: 4.58 cm2
S' Lateral: 3.95 cm

## 2022-10-11 ENCOUNTER — Other Ambulatory Visit: Payer: Self-pay | Admitting: Family Medicine

## 2022-10-11 DIAGNOSIS — I69354 Hemiplegia and hemiparesis following cerebral infarction affecting left non-dominant side: Secondary | ICD-10-CM

## 2022-10-14 ENCOUNTER — Other Ambulatory Visit: Payer: Self-pay | Admitting: Family Medicine

## 2022-10-16 ENCOUNTER — Ambulatory Visit (INDEPENDENT_AMBULATORY_CARE_PROVIDER_SITE_OTHER): Payer: Medicare Other | Admitting: Family Medicine

## 2022-10-16 ENCOUNTER — Encounter: Payer: Self-pay | Admitting: Family Medicine

## 2022-10-16 VITALS — BP 136/88 | HR 88 | Temp 98.2°F | Ht 72.0 in | Wt 260.0 lb

## 2022-10-16 DIAGNOSIS — L304 Erythema intertrigo: Secondary | ICD-10-CM | POA: Diagnosis not present

## 2022-10-16 DIAGNOSIS — R1310 Dysphagia, unspecified: Secondary | ICD-10-CM | POA: Diagnosis not present

## 2022-10-16 DIAGNOSIS — R0982 Postnasal drip: Secondary | ICD-10-CM | POA: Diagnosis not present

## 2022-10-16 DIAGNOSIS — R600 Localized edema: Secondary | ICD-10-CM | POA: Diagnosis not present

## 2022-10-16 DIAGNOSIS — T17998A Other foreign object in respiratory tract, part unspecified causing other injury, initial encounter: Secondary | ICD-10-CM | POA: Diagnosis not present

## 2022-10-16 DIAGNOSIS — Z8673 Personal history of transient ischemic attack (TIA), and cerebral infarction without residual deficits: Secondary | ICD-10-CM

## 2022-10-16 DIAGNOSIS — K219 Gastro-esophageal reflux disease without esophagitis: Secondary | ICD-10-CM

## 2022-10-16 MED ORDER — NYSTATIN 100000 UNIT/GM EX POWD: CUTANEOUS | 1 refills | Status: DC

## 2022-10-16 MED ORDER — NYSTATIN 100000 UNIT/GM EX CREA: 1.0000 | TOPICAL_CREAM | Freq: Two times a day (BID) | CUTANEOUS | 1 refills | Status: DC

## 2022-10-16 NOTE — Progress Notes (Signed)
Acute Office Visit  Subjective:     Patient ID: Jimmy Carr, male    DOB: 06/21/1941, 82 y.o.   MRN: [id number]  Chief Complaint  Patient presents with   Dysphagia    Pt reports he feels like food got stuck in throat when swallow. Been going on for a while. He added he had a stroke last year and doctor had told him he might have dysphagia.    Cough    Pt reports of cough and postal drainage going on for about 2 months. Denied sore throat.   Patient accompanied by his wife.  HPI Patient is a 82 year old male with pmh sig for HTN, status post CVA, DM2 with neuropathy and retinopathy, HTN, CAD, paroxysmal A-fib, HLD, chronic lymphedema who is in today for acute concerns and follow-up.  Patient notes dysphagia with solids.  Feels like food gets stuck in throat.  At times liquids go along way into windpipe.  Sensation does not happen all the time, maybe every few days.  Patient endorses postnasal drainage causing throat to feel itchy and cause cough.  Not currently taking Allegra which helped in the past.  Patient notes increased GERD symptoms.  Can happen with any food.  Bilateral LE edema improving.  Patient's wife wrapping legs at home.  Keeping legs elevated and monitoring sodium intake.  Home health nurse visits complete.  Tried juxta lite wraps for a few days several months ago but had to stop due to sores developing on legs.  Rash underneath panus.  Started using leftover nystatin powder.  Cleaning area every morning.  Review of Systems  HENT:         Postnasal drainage, throat irritation, dysphagia  Respiratory:  Positive for cough.   Cardiovascular:  Positive for leg swelling.  Gastrointestinal:  Positive for heartburn.  Skin:  Positive for rash.        Objective:    BP 136/88 (BP Location: Right Arm, Patient Position: Bed low/side rails up, Cuff Size: Large)   Pulse 88   Temp 98.2 F (36.8 C) (Oral)   Ht 6' (1.829 m)   Wt 260 lb (117.9 kg)   SpO2 98%   BMI 35.26  kg/m    Physical Exam Constitutional:      Appearance: Normal appearance. He is obese.  HENT:     Head: Normocephalic and atraumatic.     Right Ear: Tympanic membrane and ear canal normal.     Left Ear: Tympanic membrane and ear canal normal.     Nose: Nose normal. No congestion or rhinorrhea.     Mouth/Throat:     Mouth: Mucous membranes are moist.     Pharynx: No oropharyngeal exudate or posterior oropharyngeal erythema.     Comments: Clear postnasal drainage Eyes:     Extraocular Movements: Extraocular movements intact.     Conjunctiva/sclera: Conjunctivae normal.     Pupils: Pupils are equal, round, and reactive to light.  Cardiovascular:     Rate and Rhythm: Normal rate and regular rhythm.     Pulses: Normal pulses.     Heart sounds: Normal heart sounds.  Pulmonary:     Breath sounds: Normal breath sounds. No wheezing, rhonchi or rales.  Abdominal:     Palpations: Abdomen is soft.  Musculoskeletal:     Cervical back: Normal range of motion and neck supple. No tenderness.     Right lower leg: Edema present.     Left lower leg: Edema present.  Comments: Bilateral LEs wrapped  Lymphadenopathy:     Cervical: No cervical adenopathy.  Skin:    General: Skin is warm and dry.     Findings: Rash present. Rash is not papular.     Comments: Erythema, satellite lesions, odor underneath pannus  Neurological:     Mental Status: He is alert and oriented to person, place, and time.     Comments: Gait not assessed 2/2 history of CVA.  Patient sitting in transport wheelchair.  Residual left-sided hemiparesis.     No results found for any visits on 10/16/22.      Assessment & Plan:   Problem List Items Addressed This Visit   None Visit Diagnoses     Dysphagia, unspecified type    -  Primary   Relevant Orders   Ambulatory referral to Gastroenterology   Aspiration of liquid, initial encounter       History of CVA (cerebrovascular accident)       Bilateral lower  extremity edema       Gastroesophageal reflux disease, unspecified whether esophagitis present       Relevant Orders   Ambulatory referral to Gastroenterology   Post-nasal drainage       Intertrigo       Relevant Medications   nystatin cream (MYCOSTATIN)   nystatin powder       Meds ordered this encounter  Medications   nystatin cream (MYCOSTATIN)    Sig: Apply 1 Application topically 2 (two) times daily.    Dispense:  30 g    Refill:  1   nystatin powder    Sig: APPLY TOPICALLY THREE TIMES DAILY    Dispense:  30 g    Refill:  1   Reviewed techniques to aid with swallowing such as chin tuck.  Start Allegra for postnasal drainage.  Nystatin for intertrigo.  Will contact referral coordinator regarding updates to ENT referral previously placed in December 2023.  Referral to GI for continuing GERD symptoms.   On day of service, 33 minutes spent caring for this patient face-to-face, reviewing the chart, counseling and/or coordinating care for plan and treatment of diagnosis below.     Return if symptoms worsen or fail to improve.  [name], MD

## 2022-10-16 NOTE — Patient Instructions (Signed)
You can start taking Allegra as needed for the postnasal drainage.

## 2022-10-23 ENCOUNTER — Other Ambulatory Visit: Payer: Self-pay | Admitting: Family Medicine

## 2022-10-25 DIAGNOSIS — R161 Splenomegaly, not elsewhere classified: Secondary | ICD-10-CM | POA: Diagnosis not present

## 2022-10-25 DIAGNOSIS — E785 Hyperlipidemia, unspecified: Secondary | ICD-10-CM | POA: Diagnosis not present

## 2022-10-25 DIAGNOSIS — D62 Acute posthemorrhagic anemia: Secondary | ICD-10-CM | POA: Diagnosis not present

## 2022-10-25 DIAGNOSIS — K766 Portal hypertension: Secondary | ICD-10-CM | POA: Diagnosis not present

## 2022-10-25 DIAGNOSIS — I480 Paroxysmal atrial fibrillation: Secondary | ICD-10-CM | POA: Diagnosis not present

## 2022-10-25 DIAGNOSIS — R9389 Abnormal findings on diagnostic imaging of other specified body structures: Secondary | ICD-10-CM | POA: Diagnosis not present

## 2022-10-25 DIAGNOSIS — Z7901 Long term (current) use of anticoagulants: Secondary | ICD-10-CM | POA: Diagnosis not present

## 2022-10-25 DIAGNOSIS — I8510 Secondary esophageal varices without bleeding: Secondary | ICD-10-CM | POA: Diagnosis not present

## 2022-10-25 DIAGNOSIS — R58 Hemorrhage, not elsewhere classified: Secondary | ICD-10-CM | POA: Diagnosis not present

## 2022-10-25 DIAGNOSIS — R9431 Abnormal electrocardiogram [ECG] [EKG]: Secondary | ICD-10-CM | POA: Diagnosis not present

## 2022-10-25 DIAGNOSIS — S81802S Unspecified open wound, left lower leg, sequela: Secondary | ICD-10-CM | POA: Diagnosis not present

## 2022-10-25 DIAGNOSIS — I517 Cardiomegaly: Secondary | ICD-10-CM | POA: Diagnosis not present

## 2022-10-25 DIAGNOSIS — K219 Gastro-esophageal reflux disease without esophagitis: Secondary | ICD-10-CM | POA: Diagnosis not present

## 2022-10-25 DIAGNOSIS — K922 Gastrointestinal hemorrhage, unspecified: Secondary | ICD-10-CM | POA: Insufficient documentation

## 2022-10-25 DIAGNOSIS — E8770 Fluid overload, unspecified: Secondary | ICD-10-CM | POA: Diagnosis not present

## 2022-10-25 DIAGNOSIS — Z8673 Personal history of transient ischemic attack (TIA), and cerebral infarction without residual deficits: Secondary | ICD-10-CM | POA: Diagnosis not present

## 2022-10-25 DIAGNOSIS — E11319 Type 2 diabetes mellitus with unspecified diabetic retinopathy without macular edema: Secondary | ICD-10-CM | POA: Diagnosis not present

## 2022-10-25 DIAGNOSIS — K529 Noninfective gastroenteritis and colitis, unspecified: Secondary | ICD-10-CM | POA: Diagnosis not present

## 2022-10-25 DIAGNOSIS — S81809S Unspecified open wound, unspecified lower leg, sequela: Secondary | ICD-10-CM | POA: Insufficient documentation

## 2022-10-25 DIAGNOSIS — S81801S Unspecified open wound, right lower leg, sequela: Secondary | ICD-10-CM | POA: Diagnosis not present

## 2022-10-25 DIAGNOSIS — I8501 Esophageal varices with bleeding: Secondary | ICD-10-CM | POA: Diagnosis not present

## 2022-10-25 DIAGNOSIS — I700 Atherosclerosis of aorta: Secondary | ICD-10-CM | POA: Diagnosis not present

## 2022-10-25 DIAGNOSIS — I864 Gastric varices: Secondary | ICD-10-CM | POA: Insufficient documentation

## 2022-10-25 DIAGNOSIS — E119 Type 2 diabetes mellitus without complications: Secondary | ICD-10-CM | POA: Diagnosis not present

## 2022-10-25 DIAGNOSIS — K3189 Other diseases of stomach and duodenum: Secondary | ICD-10-CM | POA: Diagnosis not present

## 2022-10-25 DIAGNOSIS — Z6838 Body mass index (BMI) 38.0-38.9, adult: Secondary | ICD-10-CM | POA: Diagnosis not present

## 2022-10-25 DIAGNOSIS — N3289 Other specified disorders of bladder: Secondary | ICD-10-CM | POA: Diagnosis not present

## 2022-10-25 DIAGNOSIS — J849 Interstitial pulmonary disease, unspecified: Secondary | ICD-10-CM | POA: Diagnosis not present

## 2022-10-25 DIAGNOSIS — J9811 Atelectasis: Secondary | ICD-10-CM | POA: Diagnosis not present

## 2022-10-25 DIAGNOSIS — K920 Hematemesis: Secondary | ICD-10-CM | POA: Diagnosis not present

## 2022-10-25 DIAGNOSIS — R1110 Vomiting, unspecified: Secondary | ICD-10-CM | POA: Diagnosis not present

## 2022-10-25 DIAGNOSIS — I10 Essential (primary) hypertension: Secondary | ICD-10-CM | POA: Diagnosis not present

## 2022-10-25 DIAGNOSIS — I2510 Atherosclerotic heart disease of native coronary artery without angina pectoris: Secondary | ICD-10-CM | POA: Diagnosis not present

## 2022-10-25 DIAGNOSIS — K7460 Unspecified cirrhosis of liver: Secondary | ICD-10-CM | POA: Diagnosis not present

## 2022-10-25 DIAGNOSIS — K760 Fatty (change of) liver, not elsewhere classified: Secondary | ICD-10-CM | POA: Diagnosis not present

## 2022-10-25 DIAGNOSIS — E669 Obesity, unspecified: Secondary | ICD-10-CM | POA: Diagnosis not present

## 2022-10-25 DIAGNOSIS — E1142 Type 2 diabetes mellitus with diabetic polyneuropathy: Secondary | ICD-10-CM | POA: Diagnosis not present

## 2022-10-25 DIAGNOSIS — R932 Abnormal findings on diagnostic imaging of liver and biliary tract: Secondary | ICD-10-CM | POA: Diagnosis not present

## 2022-10-25 DIAGNOSIS — D696 Thrombocytopenia, unspecified: Secondary | ICD-10-CM | POA: Diagnosis not present

## 2022-10-25 DIAGNOSIS — I708 Atherosclerosis of other arteries: Secondary | ICD-10-CM | POA: Diagnosis not present

## 2022-11-01 ENCOUNTER — Inpatient Hospital Stay: Payer: Medicare Other | Admitting: Family Medicine

## 2022-11-07 DIAGNOSIS — K922 Gastrointestinal hemorrhage, unspecified: Secondary | ICD-10-CM | POA: Diagnosis not present

## 2022-11-07 DIAGNOSIS — I8501 Esophageal varices with bleeding: Secondary | ICD-10-CM | POA: Diagnosis not present

## 2022-11-13 DIAGNOSIS — K922 Gastrointestinal hemorrhage, unspecified: Secondary | ICD-10-CM | POA: Diagnosis not present

## 2022-11-13 DIAGNOSIS — I8501 Esophageal varices with bleeding: Secondary | ICD-10-CM | POA: Diagnosis not present

## 2022-11-28 ENCOUNTER — Encounter (HOSPITAL_BASED_OUTPATIENT_CLINIC_OR_DEPARTMENT_OTHER): Payer: Self-pay | Admitting: Internal Medicine

## 2022-12-05 DIAGNOSIS — I8501 Esophageal varices with bleeding: Secondary | ICD-10-CM | POA: Diagnosis not present

## 2022-12-05 DIAGNOSIS — I10 Essential (primary) hypertension: Secondary | ICD-10-CM | POA: Diagnosis not present

## 2022-12-05 DIAGNOSIS — Z885 Allergy status to narcotic agent status: Secondary | ICD-10-CM | POA: Diagnosis not present

## 2022-12-05 DIAGNOSIS — K922 Gastrointestinal hemorrhage, unspecified: Secondary | ICD-10-CM | POA: Diagnosis not present

## 2022-12-05 DIAGNOSIS — E785 Hyperlipidemia, unspecified: Secondary | ICD-10-CM | POA: Diagnosis not present

## 2022-12-05 DIAGNOSIS — Z7984 Long term (current) use of oral hypoglycemic drugs: Secondary | ICD-10-CM | POA: Diagnosis not present

## 2022-12-05 DIAGNOSIS — Z794 Long term (current) use of insulin: Secondary | ICD-10-CM | POA: Diagnosis not present

## 2022-12-05 DIAGNOSIS — E669 Obesity, unspecified: Secondary | ICD-10-CM | POA: Diagnosis not present

## 2022-12-05 DIAGNOSIS — E119 Type 2 diabetes mellitus without complications: Secondary | ICD-10-CM | POA: Diagnosis not present

## 2022-12-05 DIAGNOSIS — I4891 Unspecified atrial fibrillation: Secondary | ICD-10-CM | POA: Diagnosis not present

## 2022-12-05 DIAGNOSIS — I8510 Secondary esophageal varices without bleeding: Secondary | ICD-10-CM | POA: Diagnosis not present

## 2022-12-05 DIAGNOSIS — I480 Paroxysmal atrial fibrillation: Secondary | ICD-10-CM | POA: Diagnosis not present

## 2022-12-05 DIAGNOSIS — Z7901 Long term (current) use of anticoagulants: Secondary | ICD-10-CM | POA: Diagnosis not present

## 2022-12-05 DIAGNOSIS — K317 Polyp of stomach and duodenum: Secondary | ICD-10-CM | POA: Diagnosis not present

## 2022-12-05 DIAGNOSIS — Z79899 Other long term (current) drug therapy: Secondary | ICD-10-CM | POA: Diagnosis not present

## 2022-12-05 DIAGNOSIS — I69359 Hemiplegia and hemiparesis following cerebral infarction affecting unspecified side: Secondary | ICD-10-CM | POA: Diagnosis not present

## 2022-12-06 DIAGNOSIS — K2950 Unspecified chronic gastritis without bleeding: Secondary | ICD-10-CM | POA: Diagnosis not present

## 2022-12-17 ENCOUNTER — Other Ambulatory Visit: Payer: Self-pay | Admitting: Family Medicine

## 2022-12-17 DIAGNOSIS — I69354 Hemiplegia and hemiparesis following cerebral infarction affecting left non-dominant side: Secondary | ICD-10-CM

## 2022-12-19 ENCOUNTER — Telehealth: Payer: Self-pay

## 2022-12-19 NOTE — Patient Outreach (Signed)
  Care Coordination   12/19/2022 Name: Jimmy Carr MRN: [id number] DOB: 03/20/1941   Care Coordination Outreach Attempts:  An unsuccessful telephone outreach was attempted today to offer the patient information about available care coordination services as a benefit of their health plan.   Follow Up Plan:  Additional outreach attempts will be made to offer the patient care coordination information and services.   Encounter Outcome:  No Answer   Care Coordination Interventions:  No, not indicated    Daneen Schick, BSW, CDP Social Worker, Certified Dementia Practitioner [facility]  Care Coordination [phone]

## 2022-12-27 ENCOUNTER — Other Ambulatory Visit: Payer: Self-pay | Admitting: Family Medicine

## 2022-12-27 DIAGNOSIS — I69354 Hemiplegia and hemiparesis following cerebral infarction affecting left non-dominant side: Secondary | ICD-10-CM

## 2022-12-31 ENCOUNTER — Ambulatory Visit: Payer: Self-pay

## 2022-12-31 NOTE — Patient Instructions (Signed)
Visit Information  Thank you for taking time to visit with me today. Please don't hesitate to contact me if I can be of assistance to you.   Following are the goals we discussed today:   Goals Addressed             This Visit's Progress    COMPLETED: Care Coordination Activities       Care Coordination Interventions: [facility] screening performed - no acute resource challenges identified at this time Determined the patient does not have concerns with medication costs and/or adherence Performed chart review to note future appointments - patient does not need assistance with transportation Education provided on the role of the care coordination team - patient declines engagement at this time Encouraged the patient to contact his primary care provider as needed        If you are experiencing a Mental Health or Christine or need someone to talk to, please call 911  Patient verbalizes understanding of instructions and care plan provided today and agrees to view in [place]. Active MyChart status and patient understanding of how to access instructions and care plan via MyChart confirmed with patient.     No further follow up required: Please contact your primary care provider as needed  Daneen Schick, Arita Miss, CDP Social Worker, Certified Dementia Practitioner [place] Coordination [phone]

## 2022-12-31 NOTE — Patient Outreach (Signed)
  Care Coordination   Initial Visit Note   12/31/2022 Name: Jimmy Carr MRN: [id number] DOB: 07-02-1941  Jimmy Carr is a 82 y.o. year old male who sees [name], MD for primary care. I spoke with  Tressia Danas by phone today.  What matters to the patients health and wellness today?  No concerns, doing well at this time    Goals Addressed             This Visit's Progress    COMPLETED: Care Coordination Activities       Care Coordination Interventions: SDoH screening performed - no acute resource challenges identified at this time Determined the patient does not have concerns with medication costs and/or adherence Performed chart review to note future appointments - patient does not need assistance with transportation Education provided on the role of the care coordination team - patient declines engagement at this time Encouraged the patient to contact his primary care provider as needed         SDOH assessments and interventions completed:  Yes  SDOH Interventions Today    Flowsheet Row Most Recent Value  SDOH Interventions   Food Insecurity Interventions Intervention Not Indicated  Housing Interventions Intervention Not Indicated  Transportation Interventions Intervention Not Indicated  Utilities Interventions Intervention Not Indicated        Care Coordination Interventions:  Yes, provided   Interventions Today    Flowsheet Row Most Recent Value  Chronic Disease   Chronic disease during today's visit Hypertension (HTN), Diabetes  General Interventions   General Interventions Discussed/Reviewed General Interventions Discussed, Doctor Visits  Doctor Visits Discussed/Reviewed Doctor Visits Reviewed  Education Interventions   Education Provided Provided Education  Provided Verbal Education On Other  [Care Coordination Program]       Follow up plan: No further intervention required.   Encounter Outcome:  Pt. Visit Completed   Daneen Schick, BSW, CDP Social Worker, Certified Dementia Practitioner [facility]  Care Coordination [phone]

## 2023-01-02 ENCOUNTER — Other Ambulatory Visit: Payer: Self-pay | Admitting: Student

## 2023-01-02 MED ORDER — CARVEDILOL 3.125 MG PO TABS: 6.2500 mg | ORAL_TABLET | Freq: Two times a day (BID) | ORAL | 0 refills | Status: DC

## 2023-01-03 ENCOUNTER — Other Ambulatory Visit: Payer: Self-pay | Admitting: Family Medicine

## 2023-01-03 NOTE — Telephone Encounter (Signed)
Pt is calling and stated he need his Humalog Kwikpen /100 unit/ml kwik pen sent in today because he will be taken his last one today and want have one for tomorrow .

## 2023-01-05 ENCOUNTER — Other Ambulatory Visit: Payer: Self-pay | Admitting: Family Medicine

## 2023-01-05 DIAGNOSIS — E7849 Other hyperlipidemia: Secondary | ICD-10-CM

## 2023-01-07 ENCOUNTER — Other Ambulatory Visit: Payer: Self-pay | Admitting: Family Medicine

## 2023-01-12 ENCOUNTER — Other Ambulatory Visit: Payer: Self-pay | Admitting: Internal Medicine

## 2023-01-12 ENCOUNTER — Telehealth: Payer: Self-pay | Admitting: Cardiology

## 2023-01-12 MED ORDER — CARVEDILOL 3.125 MG PO TABS: 6.2500 mg | ORAL_TABLET | Freq: Two times a day (BID) | ORAL | 0 refills | Status: DC

## 2023-01-12 NOTE — Telephone Encounter (Signed)
Patient requested a refill of his carvedilol. He was instructed to take 6.25 mg BID. He takes two of the 3.125 mg tablets every morning and every evening. He did get a refill on 4/4, but it was only an 8 day supply.   I sent a 30 day supply to his pharmacy. He has an appointment on 4/24 that he plans to attend.   Jonita Albee, PA-C 01/12/2023 11:08 AM

## 2023-01-22 ENCOUNTER — Ambulatory Visit (INDEPENDENT_AMBULATORY_CARE_PROVIDER_SITE_OTHER): Payer: Medicare Other | Admitting: Internal Medicine

## 2023-01-22 ENCOUNTER — Encounter (HOSPITAL_BASED_OUTPATIENT_CLINIC_OR_DEPARTMENT_OTHER): Payer: Self-pay | Admitting: Internal Medicine

## 2023-01-22 VITALS — BP 135/80 | HR 69 | Ht 72.0 in | Wt 285.0 lb

## 2023-01-22 DIAGNOSIS — I8511 Secondary esophageal varices with bleeding: Secondary | ICD-10-CM | POA: Diagnosis not present

## 2023-01-22 DIAGNOSIS — I4819 Other persistent atrial fibrillation: Secondary | ICD-10-CM | POA: Diagnosis not present

## 2023-01-22 DIAGNOSIS — E118 Type 2 diabetes mellitus with unspecified complications: Secondary | ICD-10-CM

## 2023-01-22 DIAGNOSIS — I10 Essential (primary) hypertension: Secondary | ICD-10-CM

## 2023-01-22 DIAGNOSIS — I2510 Atherosclerotic heart disease of native coronary artery without angina pectoris: Secondary | ICD-10-CM

## 2023-01-22 DIAGNOSIS — R600 Localized edema: Secondary | ICD-10-CM | POA: Diagnosis not present

## 2023-01-22 DIAGNOSIS — Z794 Long term (current) use of insulin: Secondary | ICD-10-CM | POA: Diagnosis not present

## 2023-01-22 MED ORDER — CARVEDILOL 6.25 MG PO TABS: 6.2500 mg | ORAL_TABLET | Freq: Two times a day (BID) | ORAL | 3 refills | Status: AC

## 2023-01-22 NOTE — Progress Notes (Signed)
OFFICE NOTE  Chief Complaint:  Follow-up  Primary Care Physician: [name], MD  HPI:  Jimmy Carr is a 82 year-old male with a history of PAF. His atrial fib actually started in the mid 80s. He was on Coumadin, beta blockers, and Lanoxin, and over the years, we have been able to wean him off his beta blockers. He is still on a low dose of Lanoxin and several years ago, we stopped the Coumadin, and his CHADS score at that time was 2, but with no atrial fibrillation in over 10 years, we stopped the Coumadin. He is on aspirin. He has had no recurrence of any atrial fib in over 15 years. He has no palpitations. No unusual shortness of breath. No chest pain. No exertional dyspnea. Minimal puffiness of his ankles at the end of the day. No dizziness, slurred speech, or blurred vision.  He is diabetic and has been so for over 30 years. His last hemoglobin A1c was 7.2. He now has a peripheral neuropathy that involves his feet, much greater than his hands. He had been tried on Neurontin with no real help. He checks his blood pressure at home. It has been under good control. He denies arthralgias or myalgias. He walks daily for at least 30 minutes, five to seven days a week. With this, he has no claudication, no unusual breathlessness. He has had no angina. He has diffuse T wave inversion on his EKG. He was catheterized in December 2009 that showed minimal coronary disease and normal LV function. He has no real complaints today, other than the increased cost of digoxin. Exactly unclear to me why he is on digoxin, as this medicine is unlikely to keep him out of atrial fibrillation. He does have a high CHADS2 score which is 4, despite in frequent atrial fibrillation. He continues on aspirin.  There are no new complaints of chest pain or worsening shortness of breath.  Jimmy Carr recently saw Jimmy Carr in the office for what he thought was atypical chest pain. He recommended a nonsteroidal. He is  also reported more recent palpitations. He's been under significant stress recently. Today he called the office several times and spoke with our triage nurse about concern for atrial fibrillation. I did advise him that he may need to come in for an EKG to see if is actually in atrial fibrillation. Ultimately he did decide to do that.  Jimmy Carr returns today and is doing well. He is asymptomatic and has no more palpitations. We discussed to monitor his last visit if his symptoms did not improve but they seem to have improved. Overall he feels like he did well. He denies any chest pain. He reports good blood sugar control with his diabetes.  I saw Jimmy Carr back today in the office for follow-up. Overall he seems to be doing fairly well. He had recent laboratory work which shows an improvement in his lipid profile, however triglycerides remain elevated over 250. This is likely related to poorly controlled diabetes with an A1c of 7.8. He needs to continue to work on weight loss, exercise and reducing carbohydrates. He has not had any recurrent atrial fibrillation in over 15 years. Despite an elevated CHADSVASC score due to his low burden of A. fib he is only on aspirin therapy.  03/20/2017  Jimmy Carr returns today for follow-up. He denies any recurrent palpitations. He occasionally gets some lightheadedness. Recently he's had a feeling of discomfort in his stomach that goes  up to his chest. He says he feels hollow. He gets some lightheadedness and pain in the back of his neck. He describes it not like a headache but more of an aching sensation. He denies any exertional chest pain but has noticed more fatigue and dyspnea. EKG does not show atrial fibrillation today. He denies any hypoglycemia.  04/21/2018  Jimmy Carr returns today for follow-up.  Overall he is without complaints.  When I last saw him he was having some upper mid epigastric to lower central chest pain.  Ordered a stress test and a repeat  monitor for concern for possible A. fib.  He says his symptoms resolved spontaneously and he had neither test performed.  He says he is done well since that without any recurrent palpitations or chest pain.  In general he has had really good control over his blood sugars.  Recent hemoglobin A1c is 7 however on insulin and metformin.  Blood pressure is also well controlled 122/72.  LDL-C less than 70 on lovastatin.  EKG personally reviewed today shows sinus rhythm with nonspecific T wave changes at 76.  08/18/2019  Jimmy Carr is seen today as an add-on.  He was seen 2 days ago at [facility] in [place].  He had presented with symptoms concerning for TIA.  Cerebral imaging was performed including an echo which showed normal EF and negative bubble study.  He was noted on monitoring to have some brief episodes of irregular rhythm concerning for A. fib.  He does have a remote history of A. fib in the past.  It was felt this is the likely cause of his symptoms and the recommendation was to switch him to Eliquis.  In addition, he did have a lipid profile which showed a total cholesterol 114 and LDL 54.  A high potency statin was recommended and it was advised to switch to a atorvastatin from lovastatin.  04/06/2020  Jimmy Carr is seen today in follow-up.  He denies any recurrent TIAs.  Unfortunately after demonstrating what was felt to be atrial fibrillation on imaging studies I had recommended starting anticoagulation with Eliquis.  He decided that he was concerned too much about the risks of bleeding on that and is not currently taking it.  He remains only on low-dose aspirin.  He understands that I feel he is at increased risk for stroke and that his TIA may have been related to A. fib.  EKG today however does not show any atrial fibrillation.  08/08/2020  Jimmy Carr is seen today in follow-up.  He was just seen in the emergency department [facility] 2 days ago with paroxysmal atrial fibrillation.  He noted when  checking blood pressure and heart rate that his heart rate was elevated and irregular.  Otherwise he was not aware of any symptomatic A. fib.  According to the notes on presentation heart rate was in the 80s.  Blood pressure was also elevated at 282.  He was instructed to follow-up with me to discuss anticoagulation again.  Today we had a long discussion about anticoagulation and the risks and benefits of it.  He has concerns about the bleeding risks however understands that given his prior TIA, diabetes, hypertension and other risk factors that he has at high risk for stroke with atrial fibrillation.  He is again noted to be in A. fib today.  His CHA2DS2-VASc score is at least 6.  We again discussed anticoagulation with Eliquis and he is agreeable to proceed.  08/02/2022  Jimmy Carr  returns today for follow-up.  He still struggling with significant lower extremity edema.  He been transition from hydrochlorothiazide over to Lasix 20 mg daily however he has had significant swelling.  He has bilateral Ace wraps in place however they started his ankle up to his shins and he has notable pedal edema.  He has a open wound that was bleeding on the left dorsum of his foot.  He was previously seen by Marjie Skiff who had stopped his metoprolol and switch him to carvedilol.  He is only on 3.125 twice a day.  Blood pressure was notable for diastolic hypertension with a blood pressure of 100 on the diastolic side.  He recently saw his PCP who recommended a pulse treatment with diuretics however I do not think that is going to solve a long-term swelling issue.  He really needs to be on higher doses of diuretic all the time.  09/06/2022  Jimmy Carr returns today for 1 month follow-up.  He has had significant improvement in his swelling but remains volume overloaded.  He is her weight has gone down from 2 65-2 56 on higher dose Lasix.  Repeat labs showed mildly elevated BNP now at 105 with a stable creatinine at 1.19.  He  continues to have lower extremity wrappings.  They are working to fit him with compression stockings when his sores are healed.  He reports his breathing is pretty good and his energy level is improved.  01/22/2023  Jimmy Carr is seen today for follow-up.  He says he is feeling fairly well.  He still has residual defects from his prior stroke.  Recently he had upper GI bleeding and underwent esophageal banding of varices.  His beta-blocker was increased.  He says in general the higher dose of Lasix is keeping the fluid off.  That being said he still has significant swelling and has both of his legs wrapped today.  His left arm is in contracture.  Blood pressure is reasonably good today.  EKG shows a A-fib which is persistent.  He is on Xarelto.  PMHx:  Past Medical History:  Diagnosis Date   AF (atrial fibrillation)    2D ECHO, 03/01/1987 - normal; NUCLEAR STRESS TEST, 04/10/2006 - EF 65%, no ischemia   Bruit    CAROTID DOPPLER, 09/20/2008 - normal, no evidence of diameter reduction, significant tortuousity or any other vascular abnormality   Cerebrovascular accident 2003   history of small vessal lacunar stroke 2003   Diabetes mellitus type II    Glaucoma    Hyperlipidemia    Hypertension    Other specified cardiac dysrhythmias(427.89)    PSVT   Peripheral neuropathy     Past Surgical History:  Procedure Laterality Date   ANAL FISSURE REPAIR     CARDIAC CATHETERIZATION  02/02/2001   Normal LV systolic function, questionable mitral valve prolapse without mitral regurgitation, no evidence of CAD   CARDIAC CATHETERIZATION  09/13/2008   Minimal CAD, normal LV systolic function, no evidence of renal artery stenosis, distal aortic disease, or iliac disease    FAMHx:  Family History  Problem Relation Age of Onset   Diabetes Neg Hx    Colon cancer Neg Hx     SOCHx:   reports that he has never smoked. He has never used smokeless tobacco. He reports that he does not drink alcohol and does  not use drugs.  ALLERGIES:  Allergies  Allergen Reactions   Cheratussin Ac [Guaifenesin-Codeine] Nausea And Vomiting   Verapamil  ROS: Pertinent items noted in HPI and remainder of comprehensive ROS otherwise negative.  HOME MEDS: Current Outpatient Medications  Medication Sig Dispense Refill   B-D ULTRAFINE III SHORT PEN 31G X 8 MM MISC USE FOUR TIMES DAILY BEFORE MEALS AND AT BEDTIME 200 each 5   Blood Glucose Monitoring Suppl (ACCU-CHEK AVIVA PLUS) w/Device KIT USED TO CHECK BLOOD SUGAR 3 TIMES DAILY OR PRN. 1 kit 0   furosemide (LASIX) 40 MG tablet Take 1 tablet (40 mg total) by mouth daily. 90 tablet 3   gabapentin (NEURONTIN) 100 MG capsule TAKE 1 CAPSULE(100 MG) BY MOUTH AT BEDTIME 90 capsule 1   Gauze Bandages (BULKEE II GAUZE 4.5"X4.1YD) MISC 1 Box by Does not apply route 3 (three) times a week. 1 each 6   Gauze Pads & Dressings (CURITY ABDOMINAL) 5"X9" PADS Apply 1 Box topically 3 (three) times a week. 432 each 0   glucose blood (PRODIGY NO CODING BLOOD GLUC) test strip USE THREE TIMES DAILY 100 strip 2   insulin lispro (HUMALOG) 100 UNIT/ML KwikPen INJECT 50 UNITS UNDER THE SKIN AT BREAKFAST, 35 UNITS WITH LUNCH, AND 50 UNITS WITH SUPPER 123 mL 0   LANTUS SOLOSTAR 100 UNIT/ML Solostar Pen ADMINISTER 60 UNITS UNDER THE SKIN AT 10 PM 15 mL 2   latanoprost (XALATAN) 0.005 % ophthalmic solution 1 drop daily.      lisinopril (ZESTRIL) 40 MG tablet TAKE 1 TABLET(40 MG) BY MOUTH DAILY 90 tablet 2   lovastatin (MEVACOR) 40 MG tablet TAKE 1 TABLET(40 MG) BY MOUTH AT BEDTIME 90 tablet 0   metFORMIN (GLUCOPHAGE) 1000 MG tablet TAKE 1 TABLET(1000 MG) BY MOUTH TWICE DAILY WITH A MEAL 180 tablet 1   methocarbamol (ROBAXIN) 500 MG tablet TAKE 1 TABLET(500 MG) BY MOUTH FOUR TIMES DAILY 360 tablet 1   nystatin cream (MYCOSTATIN) Apply 1 Application topically 2 (two) times daily. 30 g 1   nystatin powder APPLY TOPICALLY THREE TIMES DAILY 30 g 1   potassium chloride SA (KLOR-CON M) 20 MEQ  tablet TAKE 1 TABLET BY MOUTH DAILY. TO BE TAKEN DAILY WITH HYDROCHLOROTHIAZIDE TO PREVENT LOW POTASSIUM 90 tablet 1   Prodigy Twist Top Lancets 28G MISC USE TO TEST BLOOD SUGAR THREE TIMES DAILY AS DIRECTED 100 each 2   rOPINIRole (REQUIP) 1 MG tablet TAKE 1 TABLET BY MOUTH THREE TIMES DAILY 270 tablet 1   timolol (TIMOPTIC-XR) 0.5 % ophthalmic gel-forming Place 1 drop into both eyes daily.      XARELTO 20 MG TABS tablet TAKE 1 TABLET BY MOUTH EVERY EVENING 90 tablet 0   carvedilol (COREG) 6.25 MG tablet Take 1 tablet (6.25 mg total) by mouth 2 (two) times daily with a meal. 180 tablet 3   Current Facility-Administered Medications  Medication Dose Route Frequency Provider Last Rate Last Admin   0.9 %  sodium chloride infusion  500 mL Intravenous Continuous Meryl Dare, MD        LABS/IMAGING: No results found for this or any previous visit (from the past 48 hour(s)). No results found.  VITALS: BP 135/80 (BP Location: Right Arm, Patient Position: Sitting, Cuff Size: Large)   Pulse 69   Ht 6' (1.829 m)   Wt 285 lb (129.3 kg)   BMI 38.65 kg/m   EXAM: General appearance: alert, no distress, and moderately obese Neck: no carotid bruit, no JVD, and thyroid not enlarged, symmetric, no tenderness/mass/nodules Lungs: clear to auscultation bilaterally Heart: irregularly irregular rhythm Abdomen: soft, non-tender; bowel sounds normal; no  masses,  no organomegaly and obese Extremities: 2+ edema, ace wraps from ankle to shin, left arm contracture Pulses: 2+ and symmetric Skin: Skin color, texture, turgor normal. 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  Right heart failure  PLAN: 1.   Jimmy Carr had recent GI bleeding and was found to have variceal bleeding that required banding.  He has been on Xarelto.  He has persistent A-fib with a high CHA2DS2-VASc score.  We discussed the possibility of a Watchman procedure today.  This might be a good option for him as there is high risk of potential recurrent bleeding.  I provided information for that and he will consider it.  Blood pressure appears to be controlled.  Swelling is fairly stable.  He did have an echo in January which showed no significant heart failure with normal LV function although the RV was not well-visualized but to my eye there was no evidence of any significant RV dysfunction.  [facility] Referral for Left Atrial Appendage Closure with Non-Valvular Atrial Fibrillation   Jimmy Carr is a 82 y.o. male is being referred to the [facility] Team for evaluation for Left Atrial Appendage Closure with Watchman device for the management of stroke risk resulting form non-valvular atrial fibrillation.    Base upon Jimmy Carr history, he is felt to be a poor candidate for long-term anticoagulation because of a history of bleeding (e.g. intracerebral, subdural, GI, retro-peritoneal).  The patient has a HAS-BLED score of 6 indicating a Yearly Major Bleeding Risk of 12.5%.      His CHADS2-VASc Score is 6 with an unadjusted Ischemic Stroke Rate (% per year) of 9.7%.    His stroke risk necessitates a strategy of stroke prevention with either long-term oral anticoagulation or left atrial appendage occlusion therapy. We have discussed their bleeding risk in the context of their comorbid medical problems, as well as the rationale for referral for evaluation of Watchman left atrial appendage occlusion therapy. While the patient is at high long-term bleeding risk, they may be  appropriate for short-term anticoagulation. Based on this individual patient's stroke and bleeding risk, a shared decision has been made to refer the patient for consideration of Watchman left atrial appendage closure utilizing the [employer] shared decision tool.   Chrystie Nose, MD, [facility], FACP  [place]  [facility]  Medical Director of the [facility] Lipid Disorders &  Cardiovascular Risk Reduction Clinic Diplomate of the American Board of Clinical Lipidology Attending Cardiologist  Direct Dial: [phone]  Fax: [phone]  Website:  [email or web address]  Lisette Abu Zethan Alfieri 01/22/2023, 2:25 PM

## 2023-01-22 NOTE — Patient Instructions (Addendum)
Medication Instructions:  Your physician recommends that you continue on your current medications as directed. Please refer to the Current Medication list given to you today.  *If you need a refill on your cardiac medications before your next appointment, please call your pharmacy*   Follow-Up: At [facility], you and your health needs are our priority.  As part of our continuing mission to provide you with exceptional heart care, we have created designated Provider Care Teams.  These Care Teams include your primary Cardiologist (physician) and Advanced Practice Providers (APPs -  Physician Assistants and Nurse Practitioners) who all work together to provide you with the care you need, when you need it.  We recommend signing up for the patient portal called "MyChart".  Sign up information is provided on this After Visit Summary.  MyChart is used to connect with patients for Virtual Visits (Telemedicine).  Patients are able to view lab/test results, encounter notes, upcoming appointments, etc.  Non-urgent messages can be sent to your provider as well.   To learn more about what you can do with MyChart, go to [email or web address].    Your next appointment:    12 months with Dr. Rennis Golden  Other Instructions Please contact our office if you would like a referral for WATCHMAN eval  [email or web address]  Left Atrial Appendage Closure Device Implantation  Left atrial appendage (LAA) closure device implantation is a procedure to put a small device in the LAA of the heart. The LAA is a small sac in the wall of the heart's top left chamber. Blood clots can form in the LAA in people with atrial fibrillation (AFib). AFib is a type of irregular or rapid heartbeat (arrhythmia). The device closes the LAA to help prevent blood clots from forming and a potential stroke. Tell a health care provider about: Any allergies you have. All medicines you are taking, including vitamins,  herbs, eye drops, creams, and over-the-counter medicines. Any problems you or family members have had with anesthesia. Any bleeding problems you have. Any surgeries you have had. Any medical conditions you have. Whether you are pregnant or may be pregnant. What are the risks? Your health care provider will talk with you about risks. These may include: Infection. Bleeding. Allergic reactions to medicines or dyes. Damage to nearby structures or organs. Blood clots. Device failure. Other risks include: Heart attack. Changes in heart rhythm. Stroke. Medicines Ask your provider about: Changing or stopping your regular medicines. These include any diabetes medicines or blood thinners you take. Taking medicines such as aspirin and ibuprofen. These medicines can thin your blood. Do not take them unless your provider tells you to. Taking over-the-counter medicines, vitamins, herbs, and supplements. Tests You may have a physical exam. You may have other tests, including: Blood tests. An electrocardiogram (ECG). This checks your heart's electrical patterns and rhythms. An echocardiogram. This looks at how well the heart is pumping. General instructions Do not use any products that contain nicotine or tobacco. These products include cigarettes, chewing tobacco, and vaping devices, such as e-cigarettes. If you need help quitting, ask your provider. Ask your provider what steps will be taken to help prevent infection. These steps may include: Removing hair at the surgery site. Washing skin with a soap that kills germs. Taking antibiotics. If you will be going home right after the procedure, plan to have a responsible adult: Take you home from the hospital or clinic. You will not be allowed to drive. Care for you for the time you are told.  What happens during the procedure? An IV will be inserted into one of your veins. You will be given: A sedative. This helps you relax. Anesthesia. This  keeps you from feeling pain. It will make you fall asleep for surgery. An incision will be made in a large blood vessel in your groin. A soft tube (catheter) will be put through the incision and moved up through the blood vessel to reach your heart. Dye may be injected so X-rays (fluoroscopy) can be used to guide the catheter through the blood vessel. The closure device will be moved through the catheter and into your heart. The device will be placed so that it closes the LAA. X-rays will be done to make sure the device is in the right place. The catheter will be removed. The closure device will stay in your heart. After pressure is applied over the catheter insertion site to prevent bleeding, a bandage (dressing) will be placed over the area. The procedure may vary among providers and hospitals. What happens after the procedure? Your blood pressure, heart rate, breathing rate, and blood oxygen level will be monitored until you leave the hospital or clinic. You will need to lie flat for a few hours or as told by your provider. Keep your legs straight. Do not bend or cross your legs. You may be given pain medicine. You may need to drink more fluids to flush the dye out of your body. Drink enough fluid to keep your pee (urine) pale yellow. If you were given a sedative during the procedure, it can affect you for several hours. Do not drive or operate machinery until your provider says that it is safe. This information is not intended to replace advice given to you by your health care provider. Make sure you discuss any questions you have with your health care provider. Document Revised: 04/24/2022 Document Reviewed: 04/24/2022 Elsevier Patient Education  2023 [employer].

## 2023-02-06 DIAGNOSIS — K648 Other hemorrhoids: Secondary | ICD-10-CM | POA: Diagnosis not present

## 2023-02-06 DIAGNOSIS — K625 Hemorrhage of anus and rectum: Secondary | ICD-10-CM | POA: Diagnosis not present

## 2023-02-06 DIAGNOSIS — I8510 Secondary esophageal varices without bleeding: Secondary | ICD-10-CM | POA: Diagnosis not present

## 2023-02-11 DIAGNOSIS — K625 Hemorrhage of anus and rectum: Secondary | ICD-10-CM | POA: Diagnosis not present

## 2023-02-11 DIAGNOSIS — Z6838 Body mass index (BMI) 38.0-38.9, adult: Secondary | ICD-10-CM | POA: Diagnosis not present

## 2023-02-11 DIAGNOSIS — Z79899 Other long term (current) drug therapy: Secondary | ICD-10-CM | POA: Diagnosis not present

## 2023-02-11 DIAGNOSIS — Z794 Long term (current) use of insulin: Secondary | ICD-10-CM | POA: Diagnosis not present

## 2023-02-11 DIAGNOSIS — K31819 Angiodysplasia of stomach and duodenum without bleeding: Secondary | ICD-10-CM | POA: Diagnosis not present

## 2023-02-11 DIAGNOSIS — E785 Hyperlipidemia, unspecified: Secondary | ICD-10-CM | POA: Diagnosis not present

## 2023-02-11 DIAGNOSIS — E669 Obesity, unspecified: Secondary | ICD-10-CM | POA: Diagnosis not present

## 2023-02-11 DIAGNOSIS — E1169 Type 2 diabetes mellitus with other specified complication: Secondary | ICD-10-CM | POA: Diagnosis not present

## 2023-02-11 DIAGNOSIS — Z7901 Long term (current) use of anticoagulants: Secondary | ICD-10-CM | POA: Diagnosis not present

## 2023-02-11 DIAGNOSIS — I8500 Esophageal varices without bleeding: Secondary | ICD-10-CM | POA: Diagnosis not present

## 2023-02-11 DIAGNOSIS — K5730 Diverticulosis of large intestine without perforation or abscess without bleeding: Secondary | ICD-10-CM | POA: Diagnosis not present

## 2023-02-11 DIAGNOSIS — G8190 Hemiplegia, unspecified affecting unspecified side: Secondary | ICD-10-CM | POA: Diagnosis not present

## 2023-02-11 DIAGNOSIS — K649 Unspecified hemorrhoids: Secondary | ICD-10-CM | POA: Diagnosis not present

## 2023-02-11 DIAGNOSIS — I69864 Other paralytic syndrome following other cerebrovascular disease affecting left non-dominant side: Secondary | ICD-10-CM | POA: Diagnosis not present

## 2023-02-11 DIAGNOSIS — K219 Gastro-esophageal reflux disease without esophagitis: Secondary | ICD-10-CM | POA: Diagnosis not present

## 2023-02-12 DIAGNOSIS — K5521 Angiodysplasia of colon with hemorrhage: Secondary | ICD-10-CM | POA: Diagnosis not present

## 2023-02-12 DIAGNOSIS — K31819 Angiodysplasia of stomach and duodenum without bleeding: Secondary | ICD-10-CM | POA: Diagnosis not present

## 2023-02-12 DIAGNOSIS — K3189 Other diseases of stomach and duodenum: Secondary | ICD-10-CM | POA: Diagnosis not present

## 2023-02-12 DIAGNOSIS — E1169 Type 2 diabetes mellitus with other specified complication: Secondary | ICD-10-CM | POA: Diagnosis not present

## 2023-02-12 DIAGNOSIS — I10 Essential (primary) hypertension: Secondary | ICD-10-CM | POA: Diagnosis not present

## 2023-02-12 DIAGNOSIS — K625 Hemorrhage of anus and rectum: Secondary | ICD-10-CM | POA: Diagnosis not present

## 2023-02-12 DIAGNOSIS — E669 Obesity, unspecified: Secondary | ICD-10-CM | POA: Diagnosis not present

## 2023-02-12 DIAGNOSIS — Z794 Long term (current) use of insulin: Secondary | ICD-10-CM | POA: Diagnosis not present

## 2023-02-12 DIAGNOSIS — K5520 Angiodysplasia of colon without hemorrhage: Secondary | ICD-10-CM | POA: Diagnosis not present

## 2023-02-12 DIAGNOSIS — I480 Paroxysmal atrial fibrillation: Secondary | ICD-10-CM | POA: Diagnosis not present

## 2023-02-12 DIAGNOSIS — G8190 Hemiplegia, unspecified affecting unspecified side: Secondary | ICD-10-CM | POA: Diagnosis not present

## 2023-02-12 DIAGNOSIS — I8500 Esophageal varices without bleeding: Secondary | ICD-10-CM | POA: Diagnosis not present

## 2023-02-12 DIAGNOSIS — E119 Type 2 diabetes mellitus without complications: Secondary | ICD-10-CM | POA: Diagnosis not present

## 2023-02-17 ENCOUNTER — Other Ambulatory Visit: Payer: Self-pay | Admitting: Cardiology

## 2023-03-12 ENCOUNTER — Other Ambulatory Visit: Payer: Self-pay | Admitting: Family Medicine

## 2023-03-21 ENCOUNTER — Telehealth: Payer: Self-pay | Admitting: Family Medicine

## 2023-03-21 NOTE — Telephone Encounter (Signed)
Pt called to ask MD if she would be willing to amend the Rx for the gabapentin gabapentin (NEURONTIN) 100 MG capsule   Pt states he is currently taking only 1 pill at night (before bedtime, for RLS), but does not feel it is enough for the pain in his legs during the day.   Please advise.

## 2023-03-25 ENCOUNTER — Other Ambulatory Visit: Payer: Self-pay | Admitting: Family Medicine

## 2023-03-25 DIAGNOSIS — E669 Obesity, unspecified: Secondary | ICD-10-CM | POA: Diagnosis not present

## 2023-03-25 DIAGNOSIS — Z7901 Long term (current) use of anticoagulants: Secondary | ICD-10-CM | POA: Diagnosis not present

## 2023-03-25 DIAGNOSIS — I480 Paroxysmal atrial fibrillation: Secondary | ICD-10-CM | POA: Diagnosis not present

## 2023-03-25 DIAGNOSIS — Z79899 Other long term (current) drug therapy: Secondary | ICD-10-CM | POA: Diagnosis not present

## 2023-03-25 DIAGNOSIS — I8510 Secondary esophageal varices without bleeding: Secondary | ICD-10-CM | POA: Diagnosis not present

## 2023-03-25 DIAGNOSIS — E119 Type 2 diabetes mellitus without complications: Secondary | ICD-10-CM | POA: Diagnosis not present

## 2023-03-25 DIAGNOSIS — Z7984 Long term (current) use of oral hypoglycemic drugs: Secondary | ICD-10-CM | POA: Diagnosis not present

## 2023-03-25 DIAGNOSIS — K648 Other hemorrhoids: Secondary | ICD-10-CM | POA: Diagnosis not present

## 2023-03-25 DIAGNOSIS — I69354 Hemiplegia and hemiparesis following cerebral infarction affecting left non-dominant side: Secondary | ICD-10-CM | POA: Diagnosis not present

## 2023-03-25 DIAGNOSIS — K219 Gastro-esophageal reflux disease without esophagitis: Secondary | ICD-10-CM | POA: Diagnosis not present

## 2023-03-25 DIAGNOSIS — Z794 Long term (current) use of insulin: Secondary | ICD-10-CM | POA: Diagnosis not present

## 2023-03-25 DIAGNOSIS — E1140 Type 2 diabetes mellitus with diabetic neuropathy, unspecified: Secondary | ICD-10-CM

## 2023-03-25 DIAGNOSIS — I8500 Esophageal varices without bleeding: Secondary | ICD-10-CM | POA: Diagnosis not present

## 2023-03-25 DIAGNOSIS — E785 Hyperlipidemia, unspecified: Secondary | ICD-10-CM | POA: Diagnosis not present

## 2023-03-25 DIAGNOSIS — I10 Essential (primary) hypertension: Secondary | ICD-10-CM | POA: Diagnosis not present

## 2023-03-27 NOTE — Telephone Encounter (Signed)
Increasing gabapentin would increase patient's fall risk.  Can place referral to [facility] for additional recommendations/therapies for knee pain.

## 2023-03-27 NOTE — Telephone Encounter (Signed)
Spoke with pt, informed him of previous note from PCP. States he will call back if wants the referral placed.

## 2023-03-31 ENCOUNTER — Other Ambulatory Visit: Payer: Self-pay | Admitting: Family Medicine

## 2023-03-31 DIAGNOSIS — E7849 Other hyperlipidemia: Secondary | ICD-10-CM

## 2023-04-04 ENCOUNTER — Other Ambulatory Visit: Payer: Self-pay | Admitting: Family Medicine

## 2023-04-04 DIAGNOSIS — I69354 Hemiplegia and hemiparesis following cerebral infarction affecting left non-dominant side: Secondary | ICD-10-CM

## 2023-04-05 ENCOUNTER — Other Ambulatory Visit: Payer: Self-pay | Admitting: Family Medicine

## 2023-04-05 DIAGNOSIS — Z794 Long term (current) use of insulin: Secondary | ICD-10-CM

## 2023-04-07 MED ORDER — BD PEN NEEDLE SHORT U/F 31G X 8 MM MISC
5 refills | Status: AC
Start: 2023-04-07 — End: ?

## 2023-04-07 NOTE — Addendum Note (Signed)
Addended by: Elwin Mocha on: 04/07/2023 12:21 PM   Modules accepted: Orders

## 2023-04-16 ENCOUNTER — Other Ambulatory Visit: Payer: Self-pay | Admitting: Family Medicine

## 2023-04-16 DIAGNOSIS — I10 Essential (primary) hypertension: Secondary | ICD-10-CM

## 2023-04-28 ENCOUNTER — Other Ambulatory Visit: Payer: Self-pay | Admitting: Family Medicine

## 2023-04-29 ENCOUNTER — Encounter: Payer: Self-pay | Admitting: Family Medicine

## 2023-04-29 ENCOUNTER — Telehealth (INDEPENDENT_AMBULATORY_CARE_PROVIDER_SITE_OTHER): Payer: Medicare Other | Admitting: Family Medicine

## 2023-04-29 VITALS — Ht 72.0 in | Wt 285.0 lb

## 2023-04-29 DIAGNOSIS — Z0000 Encounter for general adult medical examination without abnormal findings: Secondary | ICD-10-CM

## 2023-04-29 NOTE — Patient Instructions (Signed)
I really enjoyed getting to talk with you today! I am available on Tuesdays and Thursdays for virtual visits if you have any questions or concerns, or if I can be of any further assistance.   CHECKLIST FROM ANNUAL WELLNESS VISIT:  -Follow up (please call to schedule if not scheduled after visit):   -yearly for annual wellness visit with primary care office  Here is a list of your preventive care/health maintenance measures and the plan for each if any are due:  PLAN For any measures below that may be due:  -please call the office for the inperson visit details to get labs and foot exam -can get the tetanus booster at the office or at the pharmacy -can get the covid boosters at the pharmacy  Health Maintenance  Topic Date Due   DTaP/Tdap/Td (1 - Tdap) Never done   OPHTHALMOLOGY EXAM  05/02/2016   Diabetic kidney evaluation - Urine ACR  01/01/2019   COVID-19 Vaccine (4 - 2023-24 season) 05/31/2022   FOOT EXAM  09/12/2022   HEMOGLOBIN A1C  01/30/2023   Zoster Vaccines- Shingrix (1 of 2) 07/30/2023 (Originally 03/12/1960)   Pneumonia Vaccine 30+ Years old (1 of 1 - PCV) 04/28/2024 (Originally 03/12/2008)   INFLUENZA VACCINE  05/01/2023   Diabetic kidney evaluation - eGFR measurement  08/14/2023   Medicare Annual Wellness (AWV)  04/28/2024   Colonoscopy  02/12/2028   HPV VACCINES  Aged Out   Hepatitis C Screening  Discontinued    -See a dentist at least yearly  -Get your eyes checked and then per your eye specialist's recommendations  -Other issues addressed today:   -I have included below further information regarding a healthy whole foods based diet, physical activity guidelines for adults, stress management and opportunities for social connections. I hope you find this information useful.    -----------------------------------------------------------------------------------------------------------------------------------------------------------------------------------------------------------------------------------------------------------  NUTRITION: -eat real food: lots of colorful vegetables (half the plate) and fruits -5-7 servings of vegetables and fruits per day (fresh or steamed is best), exp. 2 servings of vegetables with lunch and dinner and 2 servings of fruit per day. Berries and greens such as kale and collards are great choices.  -consume on a regular basis: whole grains (make sure first ingredient on label contains the word "whole"), fresh fruits, fish, nuts, seeds, healthy oils (such as olive oil, avocado oil, grape seed oil) -may eat small amounts of dairy and lean meat on occasion, but avoid processed meats such as ham, bacon, lunch meat, etc. -drink water -try to avoid fast food and pre-packaged foods, processed meat -most experts advise limiting sodium to < 2300mg  per day, should limit further is any chronic conditions such as high blood pressure, heart disease, diabetes, etc. The American Heart Association advised that < 1500mg  is is ideal -try to avoid foods that contain any ingredients with names you do not recognize  -try to avoid sugar/sweets (except for the natural sugar that occurs in fresh fruit) -try to avoid sweet drinks -try to avoid white rice, white bread, pasta (unless whole grain), white or yellow potatoes  EXERCISE GUIDELINES FOR ADULTS: -if you wish to increase your physical activity, do so gradually and with the approval of your doctor -STOP and seek medical care immediately if you have any chest pain, chest discomfort or trouble breathing when starting or increasing exercise  -move and stretch your body, legs, feet and arms when sitting for long periods -Physical activity guidelines for optimal health in adults: -least 150 minutes per week of  aerobic  exercise (can talk, but not sing) once approved by your doctor, 20-30 minutes of sustained activity or two 10 minute episodes of sustained activity every day.  -resistance training at least 2 days per week if approved by your doctor

## 2023-04-29 NOTE — Progress Notes (Signed)
PATIENT CHECK-IN and HEALTH RISK ASSESSMENT QUESTIONNAIRE:  -completed by phone/video for upcoming Medicare Preventive Visit  Pre-Visit Check-in: 1)Vitals (height, wt, BP, etc) - record in vitals section for visit on day of visit 2)Review and Update Medications, Allergies PMH, Surgeries, Social history in Epic 3)Hospitalizations in the last year with date/reason?   none 4)Review and Update Care Team (patient's specialists) in Epic 5) Complete PHQ9 in Epic  6) Complete Fall Screening in Epic 7)Review all Health Maintenance Due and order under PCP if not done.  Medicare Wellness Patient Questionnaire:  Answer theses question about your habits: Do you drink alcohol? No If yes, how many drinks do you have a day?Nonw Have you ever smoked ?No Quit date if applicable?   How many packs a day do/did you smoke? None Do you use smokeless tobacco?No Do you use an illicit drugs?No Do you exercises? Yes IF so, what type and how many days/minutes per week? Walk around while sitting in wheel chair and does arm exercises - once per day, walks 15 minutes, then muscle building exercise for 15 minute Are you sexually active? No Number of partners? Typical breakfast: Fast food ham or sausage biscuits, Hash browns, Grits, Oatmeal, cereal Typical lunch: eats late breakfast Typical dinner: [employer], Spaghetti, Chicken. Vegetables, Pizza Typical snacks: bologna sandwich, peanut butter and jelly sandwich, low sugar ice cream sandwich, non sugar popsicle  Beverages:  water mostly some tea and diet drinks  Answer theses question about you: Can you perform most household chores?No Do you find it hard to follow a conversation in a noisy room?No Do you often ask people to speak up or repeat themselves?No Do you feel that you have a problem with memory?No Do you balance your checkbook and or bank acounts?No Do you feel safe at home?Yes Last dentist visit? 3-4 years ago Do you need assistance with any of the  following: Please note if so yes  Driving? Does not drive  Feeding yourself? No  Getting from bed to chair? Yes  Getting to the toilet? Yes  Bathing or showering? Does sponge baths  Dressing yourself? Yes  Managing money?Wife handles this  Climbing a flight of stairs? Does not do this, Can not do this  Preparing meals? Yes    Do you have Advanced Directives in place (Living Will, Healthcare Power or Attorney)? Yes   Last eye Exam and location? 2022, due for his exam and agrees to call and schedule   Do you currently use prescribed or non-prescribed narcotic or opioid pain medications?No  Do you have a history or close family history of breast, ovarian, tubal or peritoneal cancer or a family member with BRCA (breast cancer susceptibility 1 and 2) gene mutations? No  Nurse/Assistant Credentials/time stamp:BMS/CMA   ----------------------------------------------------------------------------------------------------------------------------------------------------------------------------------------------------------------------    MEDICARE ANNUAL PREVENTIVE CARE VISIT WITH PROVIDER (Welcome to Medicare, initial annual wellness or annual wellness exam)  Virtual Visit via Phone Note  I connected with Jimmy Carr on 04/29/23  by phone and verified that I am speaking with the correct person using two identifiers.  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 Does ok for the most part.    See HM section in Epic for other details of completed HM.    ROS: negative for report of fevers, unintentional weight loss, vision changes, vision loss, hearing loss or change, chest pain, sob, hemoptysis, melena, hematochezia, hematuria,  falls, bleeding or bruising, thoughts of suicide or self harm, memory loss  Patient-completed  extensive health risk assessment - reviewed and discussed with the patient: See Health Risk Assessment completed with patient prior to the visit either above or in recent phone note. This was reviewed in detailed with the patient today and appropriate recommendations, orders and referrals were placed as needed per Summary below and patient instructions.   Review of Medical History: -PMH, PSH, Family History and current specialty and care providers reviewed and updated and listed below   Patient Care Team: Deeann Saint, MD as PCP - General (Family Medicine) Rennis Golden Lisette Abu, MD as PCP - Cardiology (Cardiology) Aris Lot, MD as Consulting Physician (Dermatology)   Past Medical History:  Diagnosis Date   AF (atrial fibrillation) (HCC)    2D ECHO, 03/01/1987 - normal; NUCLEAR STRESS TEST, 04/10/2006 - EF 65%, no ischemia   Bruit    CAROTID DOPPLER, 09/20/2008 - normal, no evidence of diameter reduction, significant tortuousity or any other vascular abnormality   Cerebrovascular accident [facility]) 2003   history of small vessal lacunar stroke 2003   Diabetes mellitus type II    Glaucoma    Hyperlipidemia    Hypertension    Other specified cardiac dysrhythmias(427.89)    PSVT   Peripheral neuropathy     Past Surgical History:  Procedure Laterality Date   ANAL FISSURE REPAIR     CARDIAC CATHETERIZATION  02/02/2001   Normal LV systolic function, questionable mitral valve prolapse without mitral regurgitation, no evidence of CAD   CARDIAC CATHETERIZATION  09/13/2008   Minimal CAD, normal LV systolic function, no evidence of renal artery stenosis, distal aortic disease, or iliac disease    Social History   Socioeconomic History   Marital status: Married    Spouse name: Not on file   Number of children: Not on file   Years of education: Not on file   Highest education level: Master's degree (e.g., MA, MS, MEng, MEd, MSW, MBA)  Occupational History   Not on file  Tobacco Use    Smoking status: Never   Smokeless tobacco: Never  Vaping Use   Vaping status: Never Used  Substance and Sexual Activity   Alcohol use: No   Drug use: No   Sexual activity: Not on file  Other Topics Concern   Not on file  Social History Narrative   Non smoker    On disability   Social Determinants of Health   Financial Resource Strain: Low Risk  (11/04/2022)   Received [facility], [facility]   Overall Financial Resource Strain (CARDIA)    Difficulty of Paying Living Expenses: Not hard at all  Food Insecurity: No Food Insecurity (02/11/2023)   Received from [facility], [facility]   Hunger Vital Sign    Worried About Running Out of Food in the Last Year: Never true    Ran Out of Food in the Last Year: Never true  Transportation Needs: No Transportation Needs (02/12/2023)   Received from [employer], [facility]   PRAPARE - Transportation    Lack of Transportation (Medical): No    Lack of Transportation (Non-Medical): No  Physical Activity: Unknown (11/04/2022)   Received from [facility], [facility]   Exercise Vital Sign    Days of Exercise per Week: 0 days    Minutes of Exercise per Session: Not on file  Recent Concern: Physical Activity - Inactive (10/14/2022)   Exercise Vital Sign    Days  of Exercise per Week: 0 days    Minutes of Exercise per Session: 50 min  Stress: No Stress Concern Present (02/11/2023)   Received from [facility], [facility] - Occupational Stress Questionnaire    Feeling of Stress : Not at all  Social Connections: Somewhat Isolated (11/04/2022)   Received from [facility], [facility]   Social Network    How would you rate your social network (family, work, friends)?: Restricted participation with some degree of social isolation  Intimate Partner Violence: Not At Risk (02/11/2023)   Received from [facility], [facility]   HITS    Over the last 12 months how often did your  partner physically hurt you?: 1    Over the last 12 months how often did your partner insult you or talk down to you?: 1    Over the last 12 months how often did your partner threaten you with physical harm?: 1    Over the last 12 months how often did your partner scream or curse at you?: 1    Family History  Problem Relation Age of Onset   Diabetes Neg Hx    Colon cancer Neg Hx     Current Outpatient Medications on File Prior to Visit  Medication Sig Dispense Refill   Blood Glucose Monitoring Suppl (ACCU-CHEK AVIVA PLUS) w/Device KIT USED TO CHECK BLOOD SUGAR 3 TIMES DAILY OR PRN. 1 kit 0   carvedilol (COREG) 6.25 MG tablet Take 1 tablet (6.25 mg total) by mouth 2 (two) times daily with a meal. 180 tablet 3   diclofenac Sodium (VOLTAREN) 1 % GEL Apply 2 g topically as needed.     furosemide (LASIX) 40 MG tablet Take 1 tablet (40 mg total) by mouth daily. 90 tablet 3   gabapentin (NEURONTIN) 100 MG capsule TAKE 1 CAPSULE(100 MG) BY MOUTH AT BEDTIME 90 capsule 1   Gauze Bandages (BULKEE II GAUZE 4.5"X4.1YD) MISC 1 Box by Does not apply route 3 (three) times a week. 1 each 6   Gauze Pads & Dressings (CURITY ABDOMINAL) 5"X9" PADS Apply 1 Box topically 3 (three) times a week. 432 each 0   glucose blood (PRODIGY NO CODING BLOOD GLUC) test strip USE THREE TIMES DAILY 100 strip 2   insulin lispro (HUMALOG) 100 UNIT/ML KwikPen INJECT 50 UNITS UNDER THE SKIN AT BREAKFAST, 35 UNITS WITH LUNCH, AND 50 UNITS WITH SUPPER 123 mL 0   Insulin Pen Needle (B-D ULTRAFINE III SHORT PEN) 31G X 8 MM MISC USE FOUR TIMES DAILY BEFORE MEALS AND AT BEDTIME 200 each 5   LANTUS SOLOSTAR 100 UNIT/ML Solostar Pen ADMINISTER 60 UNITS UNDER THE SKIN AT 10 PM 15 mL 2   latanoprost (XALATAN) 0.005 % ophthalmic solution 1 drop daily.      lisinopril (ZESTRIL) 40 MG tablet TAKE 1 TABLET(40 MG) BY MOUTH DAILY 90 tablet 1   lovastatin (MEVACOR) 40 MG tablet TAKE 1 TABLET(40 MG) BY MOUTH AT BEDTIME 90 tablet 1   metFORMIN  (GLUCOPHAGE) 1000 MG tablet TAKE 1 TABLET(1000 MG) BY MOUTH TWICE DAILY WITH A MEAL 180 tablet 1   methocarbamol (ROBAXIN) 500 MG tablet TAKE 1 TABLET(500 MG) BY MOUTH FOUR TIMES DAILY 360 tablet 1   nystatin cream (MYCOSTATIN) Apply 1 Application topically 2 (two) times daily. 30 g 1   nystatin powder APPLY TOPICALLY THREE TIMES DAILY 30 g 1   potassium chloride SA (KLOR-CON M) 20 MEQ tablet TAKE 1 TABLET BY  MOUTH DAILY. TO BE TAKEN DAILY WITH HYDROCHLOROTHIAZIDE TO PREVENT LOW POTASSIUM 90 tablet 1   Prodigy Twist Top Lancets 28G MISC USE AS DIRECTED TO TEST THREE TIMES DAILY 300 each 3   rOPINIRole (REQUIP) 1 MG tablet TAKE 1 TABLET BY MOUTH THREE TIMES DAILY 270 tablet 1   timolol (TIMOPTIC-XR) 0.5 % ophthalmic gel-forming Place 1 drop into both eyes daily.      XARELTO 20 MG TABS tablet TAKE 1 TABLET BY MOUTH EVERY EVENING 90 tablet 0   acetaminophen (TYLENOL) 500 MG tablet Take 500 mg by mouth every 4 (four) hours as needed.     pantoprazole (PROTONIX) 40 MG tablet Take 40 mg by mouth daily.     Current Facility-Administered Medications on File Prior to Visit  Medication Dose Route Frequency Provider Last Rate Last Admin   0.9 %  sodium chloride infusion  500 mL Intravenous Continuous Meryl Dare, MD        Allergies  Allergen Reactions   Cheratussin Ac [Guaifenesin-Codeine] Nausea And Vomiting   Verapamil        Physical Exam Vitals requested from patient and listed below if patient had equipment and was able to obtain at home for this virtual visit: There were no vitals filed for this visit. Estimated body mass index is 38.65 kg/m as calculated from the following:   Height as of this encounter: 6' (1.829 m).   Weight as of this encounter: 285 lb (129.3 kg).  EKG (optional): deferred due to virtual visit  GENERAL: alert, oriented, no acute distress detected; full vision exam deferred due to pandemic and/or virtual encounter  PSYCH/NEURO: pleasant and cooperative, no  obvious depression or anxiety, speech and thought processing grossly intact, Cognitive function grossly intact  Flowsheet Row Video Visit from 04/29/2023 in [facility] [place]  PHQ-9 Total Score 0           04/29/2023    3:03 PM 10/16/2022   11:30 AM 07/11/2022   11:21 AM 07/11/2022   11:20 AM 04/25/2022    1:02 PM  Depression screen PHQ 2/9  Decreased Interest 0 0 0 0 0  Down, Depressed, Hopeless 0 1 0 0 0  PHQ - 2 Score 0 1 0 0 0  Altered sleeping 0 0     Tired, decreased energy 0 1     Change in appetite 0 0     Feeling bad or failure about yourself  0 0     Trouble concentrating 0 0     Moving slowly or fidgety/restless 0 0     Suicidal thoughts 0 0     PHQ-9 Score 0 2     Difficult doing work/chores Not difficult at all Not difficult at all          01/09/2022    2:46 PM 04/25/2022    1:09 PM 10/14/2022   10:27 AM 10/16/2022   11:30 AM 04/29/2023    3:03 PM  Fall Risk  Falls in the past year? 0 0 0 0 0  Was there an injury with Fall? 0 0  0 0  Fall Risk Category Calculator 0 0  0 0  Fall Risk Category (Retired) Low Low     (RETIRED) Patient Fall Risk Level Low fall risk Low fall risk     Patient at Risk for Falls Due to No Fall Risks No Fall Risks  Other (Comment) No Fall Risks  Fall risk Follow up Falls evaluation completed  Falls evaluation completed Falls evaluation completed     SUMMARY AND PLAN:  Encounter for Medicare annual wellness exam   Discussed applicable health maintenance/preventive health measures and advised and referred or ordered per patient preferences: -he agrees to get labs ad foot exam due at inperson visit with PCP, sent message to schedulers to assist -he agrees to call and schedule eye exam himself -he declines several of the vaccines -discussed other vaccines due and he says he will get  Health Maintenance  Topic Date Due   DTaP/Tdap/Td (1 - Tdap) Never done   OPHTHALMOLOGY EXAM  05/02/2016   Diabetic  kidney evaluation - Urine ACR  01/01/2019   COVID-19 Vaccine (4 - 2023-24 season) 05/31/2022   FOOT EXAM  09/12/2022   HEMOGLOBIN A1C  01/30/2023   Zoster Vaccines- Shingrix (1 of 2) 07/30/2023 (Originally 03/12/1960)   Pneumonia Vaccine 87+ Years old (1 of 1 - PCV) 04/28/2024 (Originally 03/12/2008)   INFLUENZA VACCINE  05/01/2023   Diabetic kidney evaluation - eGFR measurement  08/14/2023   Medicare Annual Wellness (AWV)  04/28/2024   Colonoscopy  02/12/2028   HPV VACCINES  Aged Out   Hepatitis C Screening  Discontinued   Education and counseling on the following was provided based on the above review of health and a plan/checklist for the patient, along with additional information discussed, was provided for the patient in the patient instructions :   -Advised and counseled on a healthy lifestyle - including the importance of a healthy diet, regular physical activity, social connections and stress management. -Reviewed patient's current diet. Advised and counseled on a whole foods based healthy diet. A summary of a healthy diet was provided in the Patient Instructions.  -reviewed patient's current physical activity level and discussed exercise guidelines for adults. Congratulated him on exercising daily from his wheelchair and encouraged to continue -Advise yearly dental visits at minimum and regular eye exams  Follow up: see patient instructions   Patient Instructions  I really enjoyed getting to talk with you today! I am available on Tuesdays and Thursdays for virtual visits if you have any questions or concerns, or if I can be of any further assistance.   CHECKLIST FROM ANNUAL WELLNESS VISIT:  -Follow up (please call to schedule if not scheduled after visit):   -yearly for annual wellness visit with primary care office  Here is a list of your preventive care/health maintenance measures and the plan for each if any are due:  PLAN For any measures below that may be due:  -please  call the office for the inperson visit details to get labs and foot exam -can get the tetanus booster at the office or at the pharmacy -can get the covid boosters at the pharmacy  Health Maintenance  Topic Date Due   DTaP/Tdap/Td (1 - Tdap) Never done   OPHTHALMOLOGY EXAM  05/02/2016   Diabetic kidney evaluation - Urine ACR  01/01/2019   COVID-19 Vaccine (4 - 2023-24 season) 05/31/2022   FOOT EXAM  09/12/2022   HEMOGLOBIN A1C  01/30/2023   Zoster Vaccines- Shingrix (1 of 2) 07/30/2023 (Originally 03/12/1960)   Pneumonia Vaccine 45+ Years old (1 of 1 - PCV) 04/28/2024 (Originally 03/12/2008)   INFLUENZA VACCINE  05/01/2023   Diabetic kidney evaluation - eGFR measurement  08/14/2023   Medicare Annual Wellness (AWV)  04/28/2024   Colonoscopy  02/12/2028   HPV VACCINES  Aged Out   Hepatitis C Screening  Discontinued    -See a dentist at least  yearly  -Get your eyes checked and then per your eye specialist's recommendations  -Other issues addressed today:   -I have included below further information regarding a healthy whole foods based diet, physical activity guidelines for adults, stress management and opportunities for social connections. I hope you find this information useful.   -----------------------------------------------------------------------------------------------------------------------------------------------------------------------------------------------------------------------------------------------------------  NUTRITION: -eat real food: lots of colorful vegetables (half the plate) and fruits -5-7 servings of vegetables and fruits per day (fresh or steamed is best), exp. 2 servings of vegetables with lunch and dinner and 2 servings of fruit per day. Berries and greens such as kale and collards are great choices.  -consume on a regular basis: whole grains (make sure first ingredient on label contains the word "whole"), fresh fruits, fish, nuts, seeds, healthy oils  (such as olive oil, avocado oil, grape seed oil) -may eat small amounts of dairy and lean meat on occasion, but avoid processed meats such as ham, bacon, lunch meat, etc. -drink water -try to avoid fast food and pre-packaged foods, processed meat -most experts advise limiting sodium to < 2300mg  per day, should limit further is any chronic conditions such as high blood pressure, heart disease, diabetes, etc. The American Heart Association advised that < 1500mg  is is ideal -try to avoid foods that contain any ingredients with names you do not recognize  -try to avoid sugar/sweets (except for the natural sugar that occurs in fresh fruit) -try to avoid sweet drinks -try to avoid white rice, white bread, pasta (unless whole grain), white or yellow potatoes  EXERCISE GUIDELINES FOR ADULTS: -if you wish to increase your physical activity, do so gradually and with the approval of your doctor -STOP and seek medical care immediately if you have any chest pain, chest discomfort or trouble breathing when starting or increasing exercise  -move and stretch your body, legs, feet and arms when sitting for long periods -Physical activity guidelines for optimal health in adults: -least 150 minutes per week of aerobic exercise (can talk, but not sing) once approved by your doctor, 20-30 minutes of sustained activity or two 10 minute episodes of sustained activity every day.  -resistance training at least 2 days per week if approved by your doctor  Jimmy Koyanagi, DO

## 2023-05-09 ENCOUNTER — Ambulatory Visit: Payer: Medicare Other | Admitting: Family Medicine

## 2023-05-14 ENCOUNTER — Encounter: Payer: Self-pay | Admitting: Family Medicine

## 2023-05-14 ENCOUNTER — Ambulatory Visit (INDEPENDENT_AMBULATORY_CARE_PROVIDER_SITE_OTHER): Payer: Medicare Other | Admitting: Family Medicine

## 2023-05-14 VITALS — BP 146/82 | HR 81 | Temp 98.3°F | Ht 72.0 in | Wt 285.0 lb

## 2023-05-14 DIAGNOSIS — I10 Essential (primary) hypertension: Secondary | ICD-10-CM | POA: Diagnosis not present

## 2023-05-14 DIAGNOSIS — I4819 Other persistent atrial fibrillation: Secondary | ICD-10-CM

## 2023-05-14 DIAGNOSIS — E1140 Type 2 diabetes mellitus with diabetic neuropathy, unspecified: Secondary | ICD-10-CM | POA: Diagnosis not present

## 2023-05-14 DIAGNOSIS — Z794 Long term (current) use of insulin: Secondary | ICD-10-CM

## 2023-05-14 DIAGNOSIS — G2581 Restless legs syndrome: Secondary | ICD-10-CM

## 2023-05-14 DIAGNOSIS — I8510 Secondary esophageal varices without bleeding: Secondary | ICD-10-CM | POA: Diagnosis not present

## 2023-05-14 DIAGNOSIS — I890 Lymphedema, not elsewhere classified: Secondary | ICD-10-CM | POA: Diagnosis not present

## 2023-05-14 DIAGNOSIS — E782 Mixed hyperlipidemia: Secondary | ICD-10-CM

## 2023-05-14 DIAGNOSIS — I69354 Hemiplegia and hemiparesis following cerebral infarction affecting left non-dominant side: Secondary | ICD-10-CM

## 2023-05-14 LAB — CBC WITH DIFFERENTIAL/PLATELET
Basophils Absolute: 0 10*3/uL (ref 0.0–0.1)
Basophils Relative: 0.5 % (ref 0.0–3.0)
Eosinophils Absolute: 0.2 10*3/uL (ref 0.0–0.7)
Eosinophils Relative: 3.2 % (ref 0.0–5.0)
HCT: 39.6 % (ref 39.0–52.0)
Hemoglobin: 12.7 g/dL — ABNORMAL LOW (ref 13.0–17.0)
Lymphocytes Relative: 18.4 % (ref 12.0–46.0)
Lymphs Abs: 1.1 10*3/uL (ref 0.7–4.0)
MCHC: 32.1 g/dL (ref 30.0–36.0)
MCV: 80.5 fl (ref 78.0–100.0)
Monocytes Absolute: 0.6 10*3/uL (ref 0.1–1.0)
Monocytes Relative: 9.3 % (ref 3.0–12.0)
Neutro Abs: 4.3 10*3/uL (ref 1.4–7.7)
Neutrophils Relative %: 68.6 % (ref 43.0–77.0)
Platelets: 140 10*3/uL — ABNORMAL LOW (ref 150.0–400.0)
RBC: 4.92 Mil/uL (ref 4.22–5.81)
RDW: 16.6 % — ABNORMAL HIGH (ref 11.5–15.5)
WBC: 6.2 10*3/uL (ref 4.0–10.5)

## 2023-05-14 LAB — LIPID PANEL
Cholesterol: 110 mg/dL (ref 0–200)
HDL: 42.3 mg/dL (ref >39.00)
LDL Cholesterol: 56 mg/dL (ref 0–99)
NonHDL: 67.6
Total CHOL/HDL Ratio: 3
Triglycerides: 57 mg/dL (ref 0.0–149.0)
VLDL: 11.4 mg/dL (ref 0.0–40.0)

## 2023-05-14 LAB — COMPREHENSIVE METABOLIC PANEL
ALT: 19 U/L (ref 0–53)
AST: 22 U/L (ref 0–37)
Albumin: 3.8 g/dL (ref 3.5–5.2)
Alkaline Phosphatase: 98 U/L (ref 39–117)
BUN: 20 mg/dL (ref 6–23)
CO2: 27 mEq/L (ref 19–32)
Calcium: 8.9 mg/dL (ref 8.4–10.5)
Chloride: 101 mEq/L (ref 96–112)
Creatinine, Ser: 1.15 mg/dL (ref 0.40–1.50)
GFR: 60.25 mL/min (ref >60.00)
Glucose, Bld: 129 mg/dL — ABNORMAL HIGH (ref 70–99)
Potassium: 3.9 mEq/L (ref 3.5–5.1)
Sodium: 139 mEq/L (ref 135–145)
Total Bilirubin: 1.2 mg/dL (ref 0.2–1.2)
Total Protein: 6.8 g/dL (ref 6.0–8.3)

## 2023-05-14 LAB — TSH: TSH: 1.28 u[IU]/mL (ref 0.35–5.50)

## 2023-05-14 LAB — MAGNESIUM: Magnesium: 1.8 mg/dL (ref 1.5–2.5)

## 2023-05-14 LAB — MICROALBUMIN / CREATININE URINE RATIO
Creatinine,U: 117 mg/dL
Microalb Creat Ratio: 6.5 mg/g (ref 0.0–30.0)
Microalb, Ur: 7.6 mg/dL — ABNORMAL HIGH (ref 0.0–1.9)

## 2023-05-14 LAB — HEMOGLOBIN A1C: Hgb A1c MFr Bld: 6.6 % — ABNORMAL HIGH (ref 4.6–6.5)

## 2023-05-14 LAB — T4, FREE: Free T4: 1.05 ng/dL (ref 0.60–1.60)

## 2023-05-14 MED ORDER — ROPINIROLE HCL 1 MG PO TABS
1.0000 mg | ORAL_TABLET | Freq: Three times a day (TID) | ORAL | 1 refills | Status: DC
Start: 2023-05-14 — End: 2024-01-19

## 2023-05-14 NOTE — Progress Notes (Signed)
Established Patient Office Visit   Subjective  Patient ID: Jimmy Carr, male    DOB: 08-01-1941  Age: 82 y.o. MRN: [id number]  Chief Complaint  Patient presents with   Medical Management of Chronic Issues    Labs, leg wounds   Pt accompanied by his wife.  Patient is an 82 year old male seen for follow-up on chronic conditions.  Patient had AWV a few weeks.  Was advised to schedule follow-up for labs.  Patient states he is doing somewhat better.  Still having chronic lymphedema causing skin changes and draining wounds on lower extremities.  Patient's wife wrapping legs regularly.  Patient may elevate legs for 30 minutes in the morning while in his recliner.  Patient has started moving from the recliner to the couch.  Exercises legs while sitting in recliner.  Patient still unable to walk or stand.  Lasix increased by cardiology to 40 mg daily.  Seen GI regularly since January for history of esophageal varices requiring banding x 3.  Patient had endoscopy without banding at last GI follow-up on 6/25.  Plan is to follow-up in 6 months.  No recent GIB noted.  No changes in stool color or frequency.  Requesting refill on ropinirole.  Previously wanted to increase gabapentin but states has been doing well regards to neuropathy.   Past Medical History:  Diagnosis Date   AF (atrial fibrillation) (HCC)    2D ECHO, 03/01/1987 - normal; NUCLEAR STRESS TEST, 04/10/2006 - EF 65%, no ischemia   Bruit    CAROTID DOPPLER, 09/20/2008 - normal, no evidence of diameter reduction, significant tortuousity or any other vascular abnormality   Cerebrovascular accident [facility]) 2003   history of small vessal lacunar stroke 2003   Diabetes mellitus type II    Glaucoma    Hyperlipidemia    Hypertension    Other specified cardiac dysrhythmias(427.89)    PSVT   Peripheral neuropathy    Past Surgical History:  Procedure Laterality Date   ANAL FISSURE REPAIR     CARDIAC CATHETERIZATION  02/02/2001   Normal LV  systolic function, questionable mitral valve prolapse without mitral regurgitation, no evidence of CAD   CARDIAC CATHETERIZATION  09/13/2008   Minimal CAD, normal LV systolic function, no evidence of renal artery stenosis, distal aortic disease, or iliac disease   Social History   Tobacco Use   Smoking status: Never   Smokeless tobacco: Never  Vaping Use   Vaping status: Never Used  Substance Use Topics   Alcohol use: No   Drug use: No   Family History  Problem Relation Age of Onset   Diabetes Neg Hx    Colon cancer Neg Hx    Allergies  Allergen Reactions   Cheratussin Ac [Guaifenesin-Codeine] Nausea And Vomiting   Verapamil       ROS Negative unless stated above    Objective:     BP (!) 146/82 (BP Location: Right Arm, Patient Position: Sitting, Cuff Size: Large)   Pulse 81   Temp 98.3 F (36.8 C) (Oral)   Ht 6' (1.829 m)   Wt 285 lb (129.3 kg) Comment: patient was able to stand this is a weight from home  SpO2 97%   BMI 38.65 kg/m    Physical Exam Constitutional:      General: He is not in acute distress.    Appearance: Normal appearance.  HENT:     Head: Normocephalic and atraumatic.     Nose: Nose normal.     Mouth/Throat:  Mouth: Mucous membranes are moist.  Cardiovascular:     Rate and Rhythm: Normal rate and regular rhythm.     Heart sounds: Normal heart sounds. No murmur heard.    No gallop.  Pulmonary:     Effort: Pulmonary effort is normal. No respiratory distress.     Breath sounds: Normal breath sounds. No wheezing, rhonchi or rales.  Abdominal:     General: Bowel sounds are normal.  Musculoskeletal:     Right lower leg: 3+ Edema present.     Left lower leg: 4+ Edema present.     Comments: L hemiparesis.  L UE partially flexed at elbow  Skin:    General: Skin is warm and dry.     Comments: Pedal edema L>R.  Bilateral LE edema L>>R.  Neurological:     Mental Status: He is alert and oriented to person, place, and time. Mental status  is at baseline.     Comments: Gait not assessed as sitting in transport wheelchair.  LUE contracture.    Diabetic Foot Exam - Simple   Simple Foot Form Diabetic Foot exam was performed with the following findings: Yes 05/14/2023  9:44 AM  Visual Inspection See comments: Yes Sensation Testing See comments: Yes Pulse Check See comments: Yes Comments Bilateral LE edema LLE> RLE, chronic venous stasis/lymphedema changes.  Weeping wounds and blisters on bilateral LEs.  Unable to feel monofilament or vibratory sensation.    No results found for any visits on 05/14/23.    Assessment & Plan:  Type 2 diabetes mellitus with diabetic neuropathy, with long-term current use of insulin (HCC) -Hemoglobin A1c 6.7% on 08/01/2022 -Continue metformin 1000 mg twice daily, Humalog 50 units with breakfast, 35 units with lunch, 15 units with dinner, and Lantus 60 units nightly -Continue gabapentin 100 mg nightly -foot exam done this visit -continue statin and ACE I -Eye exam due. -     Hemoglobin A1c -     Microalbumin / creatinine urine ratio  Essential hypertension -Controlled -Continue lisinopril 40 mg daily, Coreg 6.25 mg twice daily, Lasix 40 mg -     Comprehensive metabolic panel -     TSH -     T4, free -     Lipid panel -     Magnesium  Chronic acquired lymphedema -chronic venous stasis changes of b/l LEs -Continue Lasix 40 mg daily -Continue elevating LEs and wrapping them from toes up -     CBC with Differential/Platelet -     Magnesium  Secondary esophageal varices without bleeding (HCC) -Stable -EGD without banding on 03/25/2023 -Continue follow-up with GI -Given strict precautions for return symptoms. -     CBC with Differential/Platelet -     Iron, TIBC and Ferritin Panel  Mixed hyperlipidemia -Lovastatin 40 mg daily -     Comprehensive metabolic panel -     Lipid panel  Restless leg -     rOPINIRole HCl; Take 1 tablet (1 mg total) by mouth 3 (three) times daily.   Dispense: 270 tablet; Refill: 1  Hemiparesis affecting left side as late effect of cerebrovascular accident (HCC) -Continue gabapentin 100 mg nightly -Continue Requip 1 mg 3 times daily -Continue Robaxin  Persistent atrial fibrillation (HCC) -controlled -continue current meds including Xarelto, Coreg 6.25 mg twice daily -Given strict precautions due to recent episodes of esophageal varices.   Return in about 3 months (around 08/14/2023), or if symptoms worsen or fail to improve.   Deeann Saint, MD

## 2023-05-15 LAB — IRON,TIBC AND FERRITIN PANEL
%SAT: 21 % (ref 20–48)
Ferritin: 21 ng/mL — ABNORMAL LOW (ref 24–380)
Iron: 84 ug/dL (ref 50–180)
TIBC: 393 ug/dL (ref 250–425)

## 2023-06-03 ENCOUNTER — Other Ambulatory Visit: Payer: Self-pay | Admitting: Family Medicine

## 2023-06-03 DIAGNOSIS — I69354 Hemiplegia and hemiparesis following cerebral infarction affecting left non-dominant side: Secondary | ICD-10-CM

## 2023-06-06 ENCOUNTER — Other Ambulatory Visit: Payer: Self-pay | Admitting: Family Medicine

## 2023-06-11 ENCOUNTER — Other Ambulatory Visit: Payer: Self-pay

## 2023-06-11 NOTE — Progress Notes (Unsigned)
Spoke with patient about test strips, pharmacy is sending a form to fill out.

## 2023-06-16 ENCOUNTER — Other Ambulatory Visit: Payer: Self-pay | Admitting: Family Medicine

## 2023-06-16 DIAGNOSIS — I69354 Hemiplegia and hemiparesis following cerebral infarction affecting left non-dominant side: Secondary | ICD-10-CM

## 2023-06-30 ENCOUNTER — Other Ambulatory Visit: Payer: Self-pay | Admitting: Family Medicine

## 2023-06-30 DIAGNOSIS — I69354 Hemiplegia and hemiparesis following cerebral infarction affecting left non-dominant side: Secondary | ICD-10-CM

## 2023-07-23 ENCOUNTER — Other Ambulatory Visit: Payer: Self-pay | Admitting: Family Medicine

## 2023-07-23 DIAGNOSIS — L304 Erythema intertrigo: Secondary | ICD-10-CM

## 2023-07-24 DIAGNOSIS — H401133 Primary open-angle glaucoma, bilateral, severe stage: Secondary | ICD-10-CM | POA: Diagnosis not present

## 2023-07-28 ENCOUNTER — Telehealth: Payer: Self-pay | Admitting: Family Medicine

## 2023-07-28 ENCOUNTER — Telehealth: Payer: Self-pay

## 2023-07-28 DIAGNOSIS — Z794 Long term (current) use of insulin: Secondary | ICD-10-CM

## 2023-07-28 DIAGNOSIS — E1140 Type 2 diabetes mellitus with diabetic neuropathy, unspecified: Secondary | ICD-10-CM

## 2023-07-28 MED ORDER — PRODIGY NO CODING BLOOD GLUC VI STRP: ORAL_STRIP | 2 refills | Status: DC

## 2023-07-28 NOTE — Telephone Encounter (Signed)
Pt states he has been unable to get a refill on his glucose blood (Prodigy no coding blood gluc) test strips. He states his pharmacy told him there is a form that he needs to fill out in order for them to fill the prescription, but he states the form is for a blood glucose monitor and he is not on a blood glucose monitor. He states he also spoke to Medicare who told him they don't know why his prescription is not being filled. Pt would like a call back to discuss further. He states he's not sure if a new prescription needs to be called in or not.

## 2023-07-28 NOTE — Telephone Encounter (Signed)
Spoke with pharmacy. Patient insurance will not pay for the test-strips, called patient and left a message to return call, will speak with pharmacy rep here in the office to see if there is something she can help with.

## 2023-07-28 NOTE — Telephone Encounter (Signed)
Spoke with patient and he wants to try filling the [facility] at [facility] at [address] in [place], [place]. For some reason the rx is not going through when I try to send the order, can you try? Verbal cannot be called in on the rx since it needs to go through Med Part B.

## 2023-07-28 NOTE — Progress Notes (Signed)
   07/28/2023  Patient ID: Agapito Games, male   DOB: [birth year], 82 y.o.   MRN: [id number]  Contacted patient's pharmacy to determine the issue with filling the Prodigy test strips. Per their pharmacist, Medicare Part D will not cover the strips. Medicare Part B is requiring a form be filled out by the provider but they cannot fax [place] the form. They have a third party system that will send [place] the form. They have requested a rush on this request but time is TBD.   Contacted patient to inform him. [facility]s [facility] [address] in [place] to see if this will be quicker. Rx sent.  Offered a sample of sensors to patient but he declines. States he still has some strips on hand to test.  Sherrill Raring, PharmD Clinical Pharmacist [phone]

## 2023-07-29 MED ORDER — PRODIGY NO CODING BLOOD GLUC VI STRP: ORAL_STRIP | 2 refills | Status: DC

## 2023-07-29 NOTE — Addendum Note (Signed)
Addended by: Philipp Deputy A on: 07/29/2023 02:55 PM   Modules accepted: Orders

## 2023-07-31 ENCOUNTER — Telehealth: Payer: Self-pay

## 2023-07-31 MED ORDER — PRODIGY NO CODING BLOOD GLUC VI STRP: ORAL_STRIP | 3 refills | Status: AC

## 2023-07-31 NOTE — Telephone Encounter (Signed)
If patient is still unable to obtain test strips we can try sending in a prescription for a new meter and supplies or he can obtain a ReliOn meter from [facility] which is around $20.

## 2023-07-31 NOTE — Progress Notes (Signed)
   07/31/2023  Patient ID: Agapito Games, male   DOB: August 23, 1941, 82 y.o.   MRN: [id number]  Contacted [facility] to clarify issue with filling patient's test strip prescription. They did not have patient's Med Part B card on file, provided that information.  Prescription is missing an ICD-10 code required for Med Part B processing, will resend rx.  Sherrill Raring, PharmD Clinical Pharmacist [phone]

## 2023-07-31 NOTE — Progress Notes (Signed)
   07/31/2023  Patient ID: Jimmy Carr, male   DOB: 08/31/1941, 82 y.o.   MRN: [id number]  Duplicate encounter error

## 2023-07-31 NOTE — Progress Notes (Signed)
   07/31/2023  Patient ID: Jimmy Carr, male   DOB: 1940/12/11, 82 y.o.   MRN: [id number]  Contacted [facility] to make sure there are no further issues with the prescription. Was able to process the prescription but are now getting a rejection that OneTouch Ultra is preferred.   Contacted patient to notify him and see if he is okay with making this change, he states he will think about it and let me know with a call back.  Sherrill Raring, PharmD Clinical Pharmacist [phone]

## 2023-07-31 NOTE — Telephone Encounter (Signed)
His insurance covers OneTouch but I called the patient and he said he does not want to change right now and will continue to purchase OTC without insurance. Will let us know if he changes his mind.

## 2023-09-15 ENCOUNTER — Other Ambulatory Visit: Payer: Self-pay | Admitting: Internal Medicine

## 2023-09-15 ENCOUNTER — Other Ambulatory Visit: Payer: Self-pay | Admitting: Family Medicine

## 2023-09-15 DIAGNOSIS — E7849 Other hyperlipidemia: Secondary | ICD-10-CM

## 2023-09-17 ENCOUNTER — Ambulatory Visit (INDEPENDENT_AMBULATORY_CARE_PROVIDER_SITE_OTHER): Payer: Medicare Other | Admitting: Family Medicine

## 2023-09-17 ENCOUNTER — Encounter: Payer: Self-pay | Admitting: Family Medicine

## 2023-09-17 VITALS — BP 138/82 | HR 58 | Temp 97.5°F

## 2023-09-17 DIAGNOSIS — I10 Essential (primary) hypertension: Secondary | ICD-10-CM | POA: Diagnosis not present

## 2023-09-17 DIAGNOSIS — Z794 Long term (current) use of insulin: Secondary | ICD-10-CM | POA: Diagnosis not present

## 2023-09-17 DIAGNOSIS — N481 Balanitis: Secondary | ICD-10-CM

## 2023-09-17 DIAGNOSIS — Z8673 Personal history of transient ischemic attack (TIA), and cerebral infarction without residual deficits: Secondary | ICD-10-CM

## 2023-09-17 DIAGNOSIS — E1140 Type 2 diabetes mellitus with diabetic neuropathy, unspecified: Secondary | ICD-10-CM | POA: Diagnosis not present

## 2023-09-17 MED ORDER — INSULIN LISPRO (1 UNIT DIAL) 100 UNIT/ML (KWIKPEN)
PEN_INJECTOR | SUBCUTANEOUS | 5 refills | Status: AC
Start: 2023-09-17 — End: ?

## 2023-09-17 MED ORDER — CLOTRIMAZOLE 1 % EX CREA: 1.0000 | TOPICAL_CREAM | Freq: Two times a day (BID) | CUTANEOUS | 0 refills | Status: AC

## 2023-09-17 MED ORDER — FLUCONAZOLE 150 MG PO TABS: 150.0000 mg | ORAL_TABLET | Freq: Once | ORAL | 0 refills | Status: AC

## 2023-09-17 NOTE — Progress Notes (Signed)
Established Patient Office Visit   Subjective  Patient ID: Jimmy Carr, male    DOB: 05/17/1941  Age: 82 y.o. MRN: [id number]  Chief Complaint  Patient presents with   Penis Pain    Patient came in today for swollen foreskin, sore swollen and some redness, started 5 days ago. 2nd time this has happened   Pt accompanied by his wife.  Pt's daughter also in clinic for visit, but not present in room.  Pt is an 82 yo male seen for acute concern.  Pt with edema of foreskin x 5 days.  At times painful and unable to retract.  Endorses similar episode a few years ago.  Using urinal regulalrly.  Unable to stand to urinate or ambulate due to history of CVA with residual left-sided weakness.  Pt's wife is his caregiver.  Patient endorses doing leg exercises a few times per week.  DM II:  bs this am was 100.  Lowest has been 80.  Needs refill on humalog.  Recent BP 140/90.  Taking Coreg 6.25 mg twice daily, Lasix 40 mg daily, lisinopril 40 mg daily.    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) 05/11/2015   Nuclear cataract of both eyes 10/04/2014   Primary open angle glaucoma of both eyes, severe stage 02/14/2014   Chest pain 11/02/2013   CAD in native artery 11/02/2013   PAF (paroxysmal atrial fibrillation) (HCC) 03/16/2013   Erectile dysfunction 03/11/2011   SHINGLES 03/28/2010   URINARY FREQUENCY 11/07/2009   RUQ PAIN 01/24/2009   Diabetes mellitus with neuropathy (HCC) 02/24/2007   Hyperlipidemia 02/24/2007   PERIPHERAL NEUROPATHY 02/24/2007   Essential  hypertension 02/24/2007   History of cardiovascular disorder 02/24/2007   Past Medical History:  Diagnosis Date   AF (atrial fibrillation) (HCC)    2D ECHO, 03/01/1987 - normal; NUCLEAR STRESS TEST, 04/10/2006 - EF 65%, no ischemia   Bruit    CAROTID DOPPLER, 09/20/2008 - normal, no evidence of diameter reduction, significant tortuousity or any other vascular abnormality   Cerebrovascular accident [facility]) 2003   history of small vessal lacunar stroke 2003   Diabetes mellitus type II    Glaucoma    Hyperlipidemia    Hypertension    Other specified cardiac dysrhythmias(427.89)    PSVT   Peripheral neuropathy    Past Surgical History:  Procedure Laterality Date   ANAL FISSURE REPAIR     CARDIAC CATHETERIZATION  02/02/2001   Normal LV systolic function, questionable mitral valve prolapse without mitral regurgitation, no evidence of CAD   CARDIAC CATHETERIZATION  09/13/2008   Minimal CAD, normal LV systolic function, no evidence of renal artery stenosis, distal aortic disease, or iliac disease   Social History   Tobacco Use   Smoking status: Never   Smokeless tobacco: Never  Vaping Use   Vaping status: Never Used  Substance Use Topics   Alcohol use: No   Drug use: No   Family History  Problem Relation Age of Onset   Diabetes Neg Hx    Colon cancer Neg Hx    Allergies  Allergen Reactions  Cheratussin Ac [Guaifenesin-Codeine] Nausea And Vomiting   Verapamil       ROS Negative unless stated above    Objective:     BP 138/82 (BP Location: Right Arm, Patient Position: Sitting, Cuff Size: Large)   Pulse (!) 58   Temp (!) 97.5 F (36.4 C) (Oral)   SpO2 97%  BP Readings from Last 3 Encounters:  09/17/23 138/82  05/14/23 (!) 146/82  01/22/23 135/80   Wt Readings from Last 3 Encounters:  05/14/23 285 lb (129.3 kg)  04/29/23 285 lb (129.3 kg)  01/22/23 285 lb (129.3 kg)      Physical Exam Constitutional:      General: He is not in acute distress.    Appearance:  He is obese. He is ill-appearing.  HENT:     Head: Normocephalic and atraumatic.     Nose: Nose normal.  Eyes:     Extraocular Movements: Extraocular movements intact.     Conjunctiva/sclera: Conjunctivae normal.  Cardiovascular:     Rate and Rhythm: Normal rate and regular rhythm.     Heart sounds: Normal heart sounds.  Pulmonary:     Effort: Pulmonary effort is normal.     Breath sounds: Normal breath sounds.  Genitourinary:    Penis: Uncircumcised. Erythema, tenderness and swelling present. No discharge.      Comments: Pt's wife present.  Significant edema of foreskin without erythema. Pt's wife able to retract foreskin.  Mild degree of smegma.  Moderate erythema and satellite lesions at base of glans penis.  No drainage noted. Musculoskeletal:     Comments: Decreased ROM, residual paresis of LUE.  Skin:    General: Skin is warm and dry.     Comments: Sitting in transport wheelchair.  Mild LE edema, left greater than right.  Neurological:     Mental Status: He is alert and oriented to person, place, and time. Mental status is at baseline.    No results found for any visits on 09/17/23.    Assessment & Plan:  Balanitis -     Fluconazole; Take 1 tablet (150 mg total) by mouth once for 1 dose.  Dispense: 1 tablet; Refill: 0 -     Clotrimazole; Apply 1 Application topically 2 (two) times daily for 14 days.  Dispense: 30 g; Refill: 0  Type 2 diabetes mellitus with diabetic neuropathy, with long-term current use of insulin (HCC) -Controlled -Hemoglobin A1c 6.6% on 05/14/2023 -Continue metformin 1000 mg twice daily, lispro 50 units in a.m., 35 at lunch, and 50 units with dinner. -Not currently taking Lantus 60 units nightly -Foot exam up-to-date done 05/14/2023 -Eye exam encouraged -     Insulin Lispro (1 Unit Dial); INJECT 50 UNITS UNDER THE SKIN AT BREAKFAST, 35 UNITS WITH LUNCH, AND 50 UNITS WITH SUPPER  Dispense: 123 mL; Refill: 5  Essential  hypertension -Controlled -Continue lisinopril 40 mg daily, Coreg 6.25 mg twice daily -Continue lifestyle modifications including low-sodium diet -Continue follow-up cardiology  History of CVA (cerebrovascular accident) -Discussed the importance of decreasing risk factors -Continue lisinopril 40 mg daily, carvedilol 6.25 mg twice daily, lovastatin 40 mg daily. -Continued glycemic control encouraged.   Return in about 4 months (around 01/16/2024), or if symptoms worsen or fail to improve.   Deeann Saint, MD

## 2023-09-25 ENCOUNTER — Ambulatory Visit: Payer: Self-pay | Admitting: Family Medicine

## 2023-09-25 NOTE — Telephone Encounter (Signed)
Copied from CRM [id number]. Topic: Clinical - Red Word Triage >> Sep 25, 2023  3:42 PM Isabell A wrote: Red Word that prompted transfer to Nurse Triage: Patient experiencing pain & spasms in left leg due to sore.   Chief Complaint: Sore on leg, pain Symptoms: Sores on leg, weeping edema for 1 month, acute pain Frequency: Intermittent Pertinent Negatives: Patient denies constant severe pain, chest pain, SOB, bleeding, colored discharge from sores Disposition: [] ED /[] Urgent Care (no appt availability in office) / [x] Appointment(In office/virtual)/ []  [facility]/ [] Home Care/ [] Refused Recommended Disposition /[] [facility]/ []  Follow-up with PCP Additional Notes: Pt reporting that he has a sore on left leg that is "largest ever seen since been struggling with" sores and ongoing edema. Pt reporting that his legs "swell and bleeds all the time" to "not blood but" fluid. Pt reporting that this "sore's come up in the last 4-5 days, really raw and pretty big, getting as big as my hand, about the palm of my hand right now, 2-3 other small places that have also opened up, plus blister that came up this morning." Pt reporting that his legs were "pretty painful this morning, burning right bad, spasms." Pt reporting that he has been experiencing "pretty bad spasms ever since" his "stroke a year and a half ago." Pt reporting that his pain is "right at the sore, to the touch," worse "when wraps legs, irritates it, so took the wraps off, feels better now." Pt reporting that he is "more worried about it getting infected because it's pretty raw, really raw, worst sore I've had." Pt is wheelchair-bound, "right leg pretty strong but left leg not functioning very well at all," pt reporting he has neuropathy in both legs but "doesn't seem to ease the pain much," left leg is weaker as baseline. Pt reporting that is pain is more localized to the left side of his left calf. Pt reporting he pain was "10/10  last night because couldn't sleep, just so painful, almost went to ER it was so bad," took some "robaxin and ropinirole for leg spasms" and tylenol for pain, reporting that the pain "finally eased off a little bit, was able to get couple hours of sleep this morning, have had several nights like that" lately. Pt reporting that he is just having that pain "here and there, right now not so bad at all, but when it comes on, comes on pretty strong sometimes." Pt reporting that he thinks the cause of the sores is "bad circulation" and edema problems, reporting that his legs having been "pouring out" worsening in the past month or so. Pt reporting "not so much swelling, left ankle is a little more swollen than it usually is," but confirms no fever, no colored drainage, bleeding, redness, rash, or red streaks. Pt reporting the sore is "just kind of a raw place." Advised pt be examined within next 24 hours. Pt reporting he has "no transportation for today, so need tomorrow." Offered appt at [facility] office for early morning tomorrow, pt reporting he will have to call back to schedule.  Reason for Disposition  [1] Localized rash is very painful AND [2] no fever    No rash, but raw sore  Answer Assessment - Initial Assessment Questions 1. ONSET: "When did the pain start?"      Pt reporting that the pain has been since stroke a year and a half ago, pretty bad spasms ever since the stroke. Pt reporting sore in leg  is "largest ever seen since been struggling" with edema and poor circulation, showed up "4-5 days" ago. 2. LOCATION: "Where is the pain located?"      Pt reporting biggest sore is on left side of left calf 3. PAIN: "How bad is the pain?"    (Scale 1-10; or mild, moderate, severe)   -  MILD (1-3): doesn't interfere with normal activities    -  MODERATE (4-7): interferes with normal activities (e.g., work or school) or awakens from sleep, limping    -  SEVERE (8-10): excruciating pain, unable to do  any normal activities, unable to walk     Pt reporting he pain was "10/10 last night because couldn't sleep, just so painful, almost went to ER it was so bad," took some "robaxin and ropinirole for leg spasms" and tylenol for pain, reporting that the pain "finally eased off a little bit, was able to get couple hours of sleep this morning, have had several nights like that" lately. Pt reporting that he is just having that pain "here and there, right now not so bad at all, but when it comes on, comes on pretty strong sometimes." 5. CAUSE: "What do you think is causing the leg pain?"    Pt reporting that he thinks the cause of the sores is "bad circulation" and edema problems, reporting that his legs having been "pouring out" worsening in the past month or so.  6. OTHER SYMPTOMS: "Do you have any other symptoms?" (e.g., chest pain, back pain, breathing difficulty, swelling, rash, fever, numbness, weakness)     Pt reporting "not so much swelling, left ankle is a little more swollen than it usually is," but confirms no fever, no colored drainage, bleeding, redness, rash, or red streaks. Pt reporting the sore is "just kind of a raw place."  Protocols used: Leg Pain-A-AH

## 2023-09-26 ENCOUNTER — Other Ambulatory Visit: Payer: Self-pay | Admitting: Family Medicine

## 2023-09-26 DIAGNOSIS — Z794 Long term (current) use of insulin: Secondary | ICD-10-CM

## 2023-09-29 ENCOUNTER — Ambulatory Visit: Payer: Self-pay | Admitting: Family Medicine

## 2023-09-29 NOTE — Telephone Encounter (Signed)
Called and spoke with patient, he has an appointment for 10/09/2023 with Dr. Salomon Fick, patient is to call the office if and changes.

## 2023-09-29 NOTE — Telephone Encounter (Signed)
Called patient and left a message to return call to be scheduled to see a provider in the office

## 2023-09-29 NOTE — Telephone Encounter (Signed)
Copied from CRM [id number]. Topic: Clinical - Red Word Triage >> Sep 29, 2023 12:35 PM Denese Killings wrote: Red Word that prompted transfer to Nurse Triage: Patient has swelling of the foreskin area on penis. Patient's legs are draining and has sores.   Chief Complaint: Leg Swelling and Drainage Symptoms: Sores on the Right Leg Frequency: Chronic Pertinent Negatives: Patient denies fever, warmth, or acute symptoms.  Disposition: [] ED /[] Urgent Care (no appt availability in office) / [] Appointment(In office/virtual)/ []  [place] [facility]/ [x] Home Care/ [] Refused Recommended Disposition /[] [place] [facility]/ [x]  Follow-up with PCP Additional Notes: ML is a 82 year old male being triaged today for lower extremity swelling and drainage that is localized to his ankles. The left ankle is more edematous than the right. Reported sores to the right leg that require dressings.The patient questioned the need for changing the dressings on his sores and how often. Provided education on wound hygiene and infection precautions. Instructed patient to keep scheduled appointment and to call back if symptoms arise or worsened.    Reason for Disposition  [1] MILD swelling of both ankles (i.e., pedal edema) AND [2] caused by hot weather  Answer Assessment - Initial Assessment Questions 1. ONSET: "When did the swelling start?" (e.g., minutes, hours, days)     Chronic  2. LOCATION: "What part of the leg is swollen?"  "Are both legs swollen or just one leg?"     Left Ankle and Foot, Right Ankle  3. SEVERITY: "How bad is the swelling?" (e.g., localized; mild, moderate, severe)   - Localized: Small area of swelling localized to one leg.   - MILD pedal edema: Swelling limited to foot and ankle, pitting edema < 1/4 inch (6 mm) deep, rest and elevation eliminate most or all swelling.   - MODERATE edema: Swelling of lower leg to knee, pitting edema > 1/4 inch (6 mm) deep, rest and elevation only partially reduce  swelling.   - SEVERE edema: Swelling extends above knee, facial or hand swelling present.      Moderate   4. REDNESS: "Does the swelling look red or infected?"     Redness in the leg  5. PAIN: "Is the swelling painful to touch?" If Yes, ask: "How painful is it?"   (Scale 1-10; mild, moderate or severe)     Sores tender to touch  6. FEVER: "Do you have a fever?" If Yes, ask: "What is it, how was it measured, and when did it start?"      No  7. CAUSE: "What do you think is causing the leg swelling?"     "Stroke"  8. MEDICAL HISTORY: "Do you have a history of blood clots (e.g., DVT), cancer, heart failure, kidney disease, or liver failure?"     CVA  9. RECURRENT SYMPTOM: "Have you had leg swelling before?" If Yes, ask: "When was the last time?" "What happened that time?"      Yes  10. OTHER SYMPTOMS: "Do you have any other symptoms?" (e.g., chest pain, difficulty breathing)       No other symptoms.  Protocols used: Leg Swelling and Edema-A-AH

## 2023-10-02 ENCOUNTER — Ambulatory Visit: Payer: Self-pay | Admitting: Family Medicine

## 2023-10-02 DIAGNOSIS — S81801A Unspecified open wound, right lower leg, initial encounter: Secondary | ICD-10-CM | POA: Diagnosis not present

## 2023-10-02 DIAGNOSIS — E785 Hyperlipidemia, unspecified: Secondary | ICD-10-CM | POA: Diagnosis not present

## 2023-10-02 DIAGNOSIS — Z7984 Long term (current) use of oral hypoglycemic drugs: Secondary | ICD-10-CM | POA: Diagnosis not present

## 2023-10-02 DIAGNOSIS — Z888 Allergy status to other drugs, medicaments and biological substances status: Secondary | ICD-10-CM | POA: Diagnosis not present

## 2023-10-02 DIAGNOSIS — N471 Phimosis: Secondary | ICD-10-CM | POA: Diagnosis not present

## 2023-10-02 DIAGNOSIS — I10 Essential (primary) hypertension: Secondary | ICD-10-CM | POA: Diagnosis not present

## 2023-10-02 DIAGNOSIS — Z8673 Personal history of transient ischemic attack (TIA), and cerebral infarction without residual deficits: Secondary | ICD-10-CM | POA: Diagnosis not present

## 2023-10-02 DIAGNOSIS — Z794 Long term (current) use of insulin: Secondary | ICD-10-CM | POA: Diagnosis not present

## 2023-10-02 DIAGNOSIS — I480 Paroxysmal atrial fibrillation: Secondary | ICD-10-CM | POA: Diagnosis not present

## 2023-10-02 DIAGNOSIS — Z23 Encounter for immunization: Secondary | ICD-10-CM | POA: Diagnosis not present

## 2023-10-02 DIAGNOSIS — I890 Lymphedema, not elsewhere classified: Secondary | ICD-10-CM | POA: Diagnosis not present

## 2023-10-02 DIAGNOSIS — S81802A Unspecified open wound, left lower leg, initial encounter: Secondary | ICD-10-CM | POA: Diagnosis not present

## 2023-10-02 DIAGNOSIS — E11628 Type 2 diabetes mellitus with other skin complications: Secondary | ICD-10-CM | POA: Diagnosis not present

## 2023-10-02 DIAGNOSIS — Z4800 Encounter for change or removal of nonsurgical wound dressing: Secondary | ICD-10-CM | POA: Diagnosis not present

## 2023-10-02 NOTE — Telephone Encounter (Signed)
  Chief Complaint: redness spreading on inside of leg, balanitis getting worse Symptoms: increased redness, pain, weeping area to leg Frequency: 2 weeks Pertinent Negatives: Patient denies fever, denies hx of staph infection, denies hx of cellulitis. Disposition: [x] ED /[] Urgent Care (no appt availability in office) / [] Appointment(In office/virtual)/ []  [place] [facility]/ [] Home Care/ [] Refused Recommended Disposition /[] [place] [facility]/ []  Follow-up with PCP Additional Notes: cannot apply cream as the tip of the penis is too painful at this time. Increased redness to L leg, weeping at this time. Hx of DM II.  Pt hx of stroke. Pt states pain at times is 10.  Copied from CRM [id number]. Topic: Clinical - Red Word Triage >> Oct 02, 2023  9:21 AM Rosina BIRCH wrote: Red Word that prompted transfer to Nurse Triage:Pt called stating he saw the provider a couple weeks ago for the foreskin on his penis and he was instructed to call back if he was worse. Pt stated his left leg has been sore and larger in the last couple of days and his foreskin on penis is swollen and sore to touch Reason for Disposition  [1] Looks infected (spreading redness, pus) AND [2] large red area (> 2 in. or 5 cm)  Answer Assessment - Initial Assessment Questions 1. APPEARANCE of RASH: Describe the rash.      raw 2. LOCATION: Where is the rash located?      L leg knee to ankle, tip of penis also 3. NUMBER: How many spots are there?      At least 2 4. SIZE: How big are the spots? (Inches, centimeters or compare to size of a coin)      Knee to ankle and entire tip of penis 5. ONSET: When did the rash start?      2 weeks total 6. ITCHING: Does the rash itch? If Yes, ask: How bad is the itch?  (Scale 0-10; or none, mild, moderate, severe)     denies 7. PAIN: Does the rash hurt? If Yes, ask: How bad is the pain?  (Scale 0-10; or none, mild, moderate, severe)    - NONE (0): no pain    - MILD (1-3):  doesn't interfere with normal activities     - MODERATE (4-7): interferes with normal activities or awakens from sleep     - SEVERE (8-10): excruciating pain, unable to do any normal activities     Sometimes 5 sometimes 10 8. OTHER SYMPTOMS: Do you have any other symptoms? (e.g., fever)     Denies fever. Known balanitis  Protocols used: Rash or Redness - Localized-A-AH

## 2023-10-03 NOTE — Telephone Encounter (Signed)
Spoke with patient, patient is aware.

## 2023-10-06 ENCOUNTER — Ambulatory Visit: Payer: Self-pay | Admitting: Family Medicine

## 2023-10-06 NOTE — Telephone Encounter (Signed)
 Copied from CRM [id number]. Topic: Clinical - Red Word Triage >> Oct 06, 2023  9:05 AM Tonda B wrote: [facility] that prompted transfer to Nurse Triage: pt is calling in with severe pain in left knee   Chief Complaint: left knee pain Symptoms: 11/10 pain radiates to calf Frequency: ongoing but worsened in the last 6 months;  Pertinent Negatives: Patient denies redness or swelling  Disposition: [] ED /[x] Urgent Care (no appt availability in office) / [] Appointment(In office/virtual)/ []  [place] [facility]/ [] Home Care/ [] Refused Recommended Disposition /[] [place] [facility]/ []  Follow-up with PCP Additional Notes: The patient reported 11/10 left knee pain that radiates to his calf.  He stated that the pain is recurring but it has worsened in the last 6 months and is worse than it has ever been in the last 3-4 hours.  He reported that the pain is unrelieved by Tylenol  and requested a stronger pain reliever.  He denied swelling or redness.  Advised the patient to go to urgent care as there was no availability in the office.  He declined stating that he does not have transportation and he will just wait until his appointment Thursday.  Strongly urged the patient to be seen today for further evaluation.  Routed to pcp for awareness and further recommendations.   Reason for Disposition  [1] SEVERE pain (e.g., excruciating, unable to walk) AND [2] not improved after 2 hours of pain medicine  Answer Assessment - Initial Assessment Questions 1. LOCATION and RADIATION: Where is the pain located?      Left Knee radiates to calf  2. QUALITY: What does the pain feel like?  (e.g., sharp, dull, aching, burning)     Knee jerks and spasms and sharp pain like someone is stabbing  3. SEVERITY: How bad is the pain? What does it keep you from doing?   (Scale 1-10; or mild, moderate, severe)   -  MILD (1-3): doesn't interfere with normal activities    -  MODERATE (4-7): interferes with normal activities  (e.g., work or school) or awakens from sleep, limping    -  SEVERE (8-10): excruciating pain, unable to do any normal activities, unable to walk     11/10; confined to wheelchair; stroke 2 years ago  4. ONSET: When did the pain start? Does it come and go, or is it there all the time?     Worsened this morning  5. RECURRENT: Have you had this pain before? If Yes, ask: When, and what happened then?     Recurring Last 6 months the pain has intensified  6. SETTING: Has there been any recent work, exercise or other activity that involved that part of the body?      Bound to wheelchair   8. ASSOCIATED SYMPTOMS: Is there any swelling or redness of the knee?     None  9. OTHER SYMPTOMS: Do you have any other symptoms? (e.g., chest pain, difficulty breathing, fever, calf pain)     Calf pain  Protocols used: Knee Pain-A-AH

## 2023-10-09 ENCOUNTER — Encounter: Payer: Self-pay | Admitting: Family Medicine

## 2023-10-09 ENCOUNTER — Ambulatory Visit (INDEPENDENT_AMBULATORY_CARE_PROVIDER_SITE_OTHER): Payer: Medicare Other | Admitting: Family Medicine

## 2023-10-09 VITALS — BP 132/84 | HR 84 | Temp 97.6°F | Ht 72.0 in

## 2023-10-09 DIAGNOSIS — R600 Localized edema: Secondary | ICD-10-CM | POA: Diagnosis not present

## 2023-10-09 DIAGNOSIS — R748 Abnormal levels of other serum enzymes: Secondary | ICD-10-CM

## 2023-10-09 DIAGNOSIS — Z8673 Personal history of transient ischemic attack (TIA), and cerebral infarction without residual deficits: Secondary | ICD-10-CM | POA: Diagnosis not present

## 2023-10-09 DIAGNOSIS — N471 Phimosis: Secondary | ICD-10-CM

## 2023-10-09 DIAGNOSIS — M62838 Other muscle spasm: Secondary | ICD-10-CM

## 2023-10-09 NOTE — Progress Notes (Signed)
 Established Patient Office Visit   Subjective  Patient ID: Jimmy Carr, male    DOB: October 10, 1940  Age: 83 y.o. MRN: [id number]  Chief Complaint  Patient presents with   Wound Check    Bilateral leg weeping and drainage, left Knee pain, phimosis of penis   Pt accompanied by his wife and daughter.  Patient is an 83 year old male seen for follow-up on several ongoing concerns.  Patient last seen in office on 09/17/2023 balanitis.  Given Rx for antifungal cream.  Was unable to apply cream as directed due to inability to retract foreskin.  Patient seen in ED on 10/02/2023 for phimosis.  Urology contacted.  Advised to start bacitracin cream and follow-up in clinic.  Per patient's wife the urology office patient was advised to follow-up that was in [place].  Still having intermittent difficulty retracting foreskin.  Scrotal edema difficulty urinating, fever, chills.  Per patient's wife, LE edema with drainage also noted in ED however it was not addressed.  Wrapping legs daily sometimes twice a day due to soaked dressings.  Legs developing blisters that burst and cause open sores.  Patient sedentary/unable to ambulate status post CVA.  Elevating LEs some while sitting during the day.  Sleeping in recliner at night.  Spasms in left knee typically at night.  Taking gabapentin  100 mg nightly and Robaxin  500 mg.  Patient thinks an extra dose of medication may help with symptoms.    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) 05/11/2015   Nuclear cataract of both eyes  10/04/2014   Primary open angle glaucoma of both eyes, severe stage 02/14/2014   Chest pain 11/02/2013   CAD in native artery 11/02/2013   PAF (paroxysmal atrial fibrillation) (HCC) 03/16/2013   Erectile dysfunction 03/11/2011   SHINGLES 03/28/2010   URINARY FREQUENCY 11/07/2009   RUQ PAIN 01/24/2009   Diabetes mellitus with neuropathy (HCC) 02/24/2007   Hyperlipidemia 02/24/2007   PERIPHERAL NEUROPATHY 02/24/2007   Essential hypertension 02/24/2007   History of cardiovascular disorder 02/24/2007   Past Medical History:  Diagnosis Date   AF (atrial fibrillation) (HCC)    2D ECHO, 03/01/1987 - normal; NUCLEAR STRESS TEST, 04/10/2006 - EF 65%, no ischemia   Bruit    CAROTID DOPPLER, 09/20/2008 - normal, no evidence of diameter reduction, significant tortuousity or any other vascular abnormality   Cerebrovascular accident [facility]) 2003   history of small vessal lacunar stroke 2003   Diabetes mellitus type II    Glaucoma    Hyperlipidemia    Hypertension    Other specified cardiac dysrhythmias(427.89)    PSVT   Peripheral neuropathy    Past Surgical History:  Procedure Laterality Date   ANAL FISSURE REPAIR     CARDIAC CATHETERIZATION  02/02/2001   Normal LV systolic function, questionable mitral valve prolapse without mitral regurgitation, no evidence of CAD   CARDIAC CATHETERIZATION  09/13/2008   Minimal CAD, normal LV systolic function, no evidence of renal artery stenosis, distal aortic disease, or iliac disease   Social History   Tobacco Use   Smoking status: Never   Smokeless tobacco: Never  Vaping Use   Vaping status: Never Used  Substance Use Topics   Alcohol use: No   Drug use: No   Family History  Problem Relation Age of Onset   Diabetes Neg Hx    Colon cancer Neg Hx    Allergies  Allergen Reactions   Cheratussin Ac [Guaifenesin -Codeine ] Nausea And Vomiting   Verapamil       ROS Negative unless stated above    Objective:     BP 132/84 (BP Location:  Right Wrist, Patient Position: Sitting, Cuff Size: Normal)   Pulse 84   Temp 97.6 F (36.4 C) (Oral)   Ht 6' (1.829 m)   SpO2 (!) 86%   BMI 38.65 kg/m  BP Readings from Last 3 Encounters:  10/09/23 132/84  09/17/23 138/82  05/14/23 (!) 146/82   Wt Readings from Last 3 Encounters:  05/14/23 285 lb (129.3 kg)  04/29/23 285 lb (129.3 kg)  01/22/23 285 lb (129.3 kg)      Physical Exam Constitutional:      General: He is not in acute distress.    Appearance: Normal appearance.  HENT:     Head: Normocephalic and atraumatic.     Nose: Nose normal.     Mouth/Throat:     Mouth: Mucous membranes are moist.  Cardiovascular:     Rate and Rhythm: Normal rate and regular rhythm.     Heart sounds: Normal heart sounds. No murmur heard.    No gallop.     Comments: 3+ pedal edema to left knee.  Trace edema of left thigh.  2+ pedal edema to right knee. Pulmonary:     Effort: Pulmonary effort is normal.     Breath sounds: Normal breath sounds.  Musculoskeletal:     Right lower leg: Edema present.     Left lower leg: 3+ Edema present.  Skin:    General: Skin is warm and dry.          Comments: Edematous foreskin of penis with moderate difficulty retracting.  No erythema, induration, drainage, lesions noted.  No scrotal edema.  Bilateral LEs with chronic venous stasis changes. L>R.  Blisters and open sores of bilateral LEs.  LEs weeping serous fluid.  Neurological:     Mental Status: He is alert and oriented to person, place, and time. Mental status is at baseline.     Comments: Sitting in transport wheelchair.  Gait not assessed.  Decreased ROM and weakness in LUE.      No results found for any visits on 10/09/23.    Assessment & Plan:  Phimosis -Mild improvement since OFV on 09/17/2023 -Advised to start antibiotic ointment antifungal treatment in affected. -Stat referral to urology placed. -Given strict precautions worsening symptoms. -     Ambulatory referral to  Urology  Bilateral lower extremity edema -2/2 chronic lymphedema status post CVA -No signs of cellulitis currently noted.  Per chart review CBC from ED visit 10/02/2023:  WBC 8, hemoglobin 12.1, HCT 38.3 MCV 79, platelets 198.  Albumin 3.2 -Discussed the importance of elevation, compression, decreasing sodium intake, etc. -Will increase dose of Lasix  to 40 mg twice daily x 3 days along with an extra dose of potassium supplement.  Return to regular dosing of Lasix  40 mg after the 3 days.   -Continue daily dressing changes -Bilateral LEs wrapped in clinic. -Referral to home health for wound care placed. -     Ambulatory referral to Home Health  Muscle spasm -Patient encouraged to try adjusting timing  of Robaxin  doses -Continue Requip  -Can take an additional gabapentin  100 mg tablet if needed in the middle of the night -Labs from recent ED visit reviewed.  Sodium 139, potassium 3.9 normal.  Elevated alkaline phosphatase level -Alk phos 173 during ED visit on 10/02/2023.  LFTs were normal. -Recheck in the next few weeks.  For continued elevation obtain imaging.  History of CVA (cardiovascular accident) -Blood pressure and cholesterol control -Lovastatin  40 mg daily, lisinopril  40 mg daily, Coreg  6.25 mg twice daily  Return if symptoms worsen or fail to improve.   Clotilda JONELLE Single, MD

## 2023-10-09 NOTE — Patient Instructions (Addendum)
 Take an extra dose of Lasix  around 1 PM for the next 3 days.  You will also need to take an extra dose of potassium with this.  Remember to elevate your legs when sitting to help reduce the swelling.  A referral to the urologist was placed.  You can use the bacitracin cream.  A referral for home health wound care was also placed.  They will contact you about setting this up.

## 2023-10-09 NOTE — Telephone Encounter (Signed)
 Previously made aware of below information.  Patient has appointment today for concerns.

## 2023-10-10 ENCOUNTER — Other Ambulatory Visit: Payer: Self-pay | Admitting: Family Medicine

## 2023-10-16 DIAGNOSIS — H401133 Primary open-angle glaucoma, bilateral, severe stage: Secondary | ICD-10-CM | POA: Diagnosis not present

## 2023-10-16 DIAGNOSIS — Z993 Dependence on wheelchair: Secondary | ICD-10-CM | POA: Diagnosis not present

## 2023-10-16 DIAGNOSIS — E1142 Type 2 diabetes mellitus with diabetic polyneuropathy: Secondary | ICD-10-CM | POA: Diagnosis not present

## 2023-10-16 DIAGNOSIS — I10 Essential (primary) hypertension: Secondary | ICD-10-CM | POA: Diagnosis not present

## 2023-10-16 DIAGNOSIS — E113299 Type 2 diabetes mellitus with mild nonproliferative diabetic retinopathy without macular edema, unspecified eye: Secondary | ICD-10-CM | POA: Diagnosis not present

## 2023-10-16 DIAGNOSIS — I2510 Atherosclerotic heart disease of native coronary artery without angina pectoris: Secondary | ICD-10-CM | POA: Diagnosis not present

## 2023-10-16 DIAGNOSIS — K769 Liver disease, unspecified: Secondary | ICD-10-CM | POA: Diagnosis not present

## 2023-10-16 DIAGNOSIS — I480 Paroxysmal atrial fibrillation: Secondary | ICD-10-CM | POA: Diagnosis not present

## 2023-10-16 DIAGNOSIS — I69354 Hemiplegia and hemiparesis following cerebral infarction affecting left non-dominant side: Secondary | ICD-10-CM | POA: Diagnosis not present

## 2023-10-16 DIAGNOSIS — I890 Lymphedema, not elsewhere classified: Secondary | ICD-10-CM | POA: Diagnosis not present

## 2023-10-20 ENCOUNTER — Telehealth: Payer: Self-pay | Admitting: Family Medicine

## 2023-10-20 NOTE — Telephone Encounter (Signed)
Crystal travel rn with [facility] is calling with plan of care for wound care. Pt has  lymphedema extremity the swell has cause water blisters and would like to clean it with normal saline, pat dry and apply xeroform  and then esu-dry wrap with kerlix follow by ace wraps from bottom of toes to bend of his knee and skilled nurse to change once a wk for 8 wk's wife is to change daily and wife has been changing daily and she is competent to do the task also will add 2 prn visit

## 2023-10-21 ENCOUNTER — Other Ambulatory Visit: Payer: Self-pay | Admitting: Family Medicine

## 2023-10-21 DIAGNOSIS — I10 Essential (primary) hypertension: Secondary | ICD-10-CM

## 2023-10-23 ENCOUNTER — Encounter: Payer: Self-pay | Admitting: Urology

## 2023-10-23 ENCOUNTER — Ambulatory Visit (INDEPENDENT_AMBULATORY_CARE_PROVIDER_SITE_OTHER): Payer: Medicare Other | Admitting: Urology

## 2023-10-23 VITALS — BP 133/77 | HR 87 | Ht 72.0 in | Wt 285.0 lb

## 2023-10-23 DIAGNOSIS — I890 Lymphedema, not elsewhere classified: Secondary | ICD-10-CM

## 2023-10-23 DIAGNOSIS — N471 Phimosis: Secondary | ICD-10-CM

## 2023-10-23 NOTE — Telephone Encounter (Signed)
Ok

## 2023-10-23 NOTE — Progress Notes (Signed)
Assessment: 1. Lymphedema of penis   2. Phimosis     Plan: I personally reviewed the patient's chart including provider notes. The primary issue appears to be lymphedema of the penis and foreskin.  This is likely secondary to his chronic lower extremity lymphedema.  I do not see any obvious signs of balanitis.  The recommended treatment is continued management of his chronic lymphedema.  I recommended that they contact Dr. Salomon Fick to discuss further management.  I do not recommend any surgical intervention as a circumcision would not treat the underlying problem. Continue local care with cleaning and application of antimicrobial ointment as needed.  I did not recommend trying to retract the foreskin due to the significant edema and the risk of causing a paraphimosis. Return to office prn  Chief Complaint:  Chief Complaint  Patient presents with   Phimosis    History of Present Illness:  Jimmy Carr is a 83 y.o. male who is seen in consultation from Deeann Saint, MD for evaluation of phimosis.   He has noted swelling of the foreskin for approximately 6 weeks.  Prior to this, he reports no problems retracting the foreskin.  He has had some discomfort associated with the swelling.  He is voiding without difficulty.  He has had an increase in his lower extremity edema for the past several months.  His dose of Lasix was increased but he did not tolerate this due to a low blood pressure.  He has been using antifungal and antimicrobial ointments on the foreskin.  No dysuria or gross hematuria.   Past Medical History:  Past Medical History:  Diagnosis Date   AF (atrial fibrillation) (HCC)    2D ECHO, 03/01/1987 - normal; NUCLEAR STRESS TEST, 04/10/2006 - EF 65%, no ischemia   Bruit    CAROTID DOPPLER, 09/20/2008 - normal, no evidence of diameter reduction, significant tortuousity or any other vascular abnormality   Cerebrovascular accident [facility]) 2003   history of small vessal lacunar  stroke 2003   Diabetes mellitus type II    Glaucoma    Hyperlipidemia    Hypertension    Other specified cardiac dysrhythmias(427.89)    PSVT   Peripheral neuropathy     Past Surgical History:  Past Surgical History:  Procedure Laterality Date   ANAL FISSURE REPAIR     CARDIAC CATHETERIZATION  02/02/2001   Normal LV systolic function, questionable mitral valve prolapse without mitral regurgitation, no evidence of CAD   CARDIAC CATHETERIZATION  09/13/2008   Minimal CAD, normal LV systolic function, no evidence of renal artery stenosis, distal aortic disease, or iliac disease    Allergies:  Allergies  Allergen Reactions   Cheratussin Ac [Guaifenesin-Codeine] Nausea And Vomiting   Verapamil     Family History:  Family History  Problem Relation Age of Onset   Diabetes Neg Hx    Colon cancer Neg Hx     Social History:  Social History   Tobacco Use   Smoking status: Never   Smokeless tobacco: Never  Vaping Use   Vaping status: Never Used  Substance Use Topics   Alcohol use: No   Drug use: No    Review of symptoms:  Constitutional:  Negative for unexplained weight loss, night sweats, fever, chills ENT:  Negative for nose bleeds, sinus pain, painful swallowing CV:  Negative for chest pain, shortness of breath, exercise intolerance, palpitations, loss of consciousness Resp:  Negative for cough, wheezing, shortness of breath GI:  Negative for nausea, vomiting,  diarrhea, bloody stools GU:  Positives noted in HPI; otherwise negative for gross hematuria, dysuria, urinary incontinence Neuro:  Negative for seizures, slurred speech Psych:  Negative for lack of energy, depression, anxiety Endocrine:  Negative for polydipsia, polyuria, symptoms of hypoglycemia (dizziness, hunger, sweating) Hematologic:  Negative for anemia, purpura, petechia, prolonged or excessive bleeding, use of anticoagulants  Allergic:  Negative for difficulty breathing or choking as a result of exposure to  anything; no shellfish allergy; no allergic response (rash/itch) to materials, foods  Physical exam: BP 133/77   Pulse 87   Ht 6' (1.829 m)   Wt 285 lb (129.3 kg)   BMI 38.65 kg/m  GENERAL APPEARANCE:  Well appearing, well developed, well nourished, NAD HEENT:  Atraumatic, normocephalic, oropharynx clear NECK:  Supple without lymphadenopathy or thyromegaly ABDOMEN:  Soft, non-tender, no masses EXTREMITIES:  Significant LE edema, bilateral LE wrapped NEUROLOGIC:  Alert and oriented x 3, in wheelchair, CN II-XII grossly intact MENTAL STATUS:  appropriate BACK:  Non-tender to palpation, No CVAT SKIN:  Warm, dry, and intact GU: Exam was limited due to patient inability to stand or transfer Penis:  redundant foreskin with significant edema of foreskin; no erythema or signs of infection Meatus: unable to visualize due to edema of foreskin   Results: U/A: 6-10 WBC, 0-2 RBC

## 2023-10-24 ENCOUNTER — Telehealth: Payer: Self-pay | Admitting: Family Medicine

## 2023-10-24 DIAGNOSIS — I890 Lymphedema, not elsewhere classified: Secondary | ICD-10-CM | POA: Diagnosis not present

## 2023-10-24 DIAGNOSIS — M545 Low back pain, unspecified: Secondary | ICD-10-CM

## 2023-10-24 DIAGNOSIS — I69354 Hemiplegia and hemiparesis following cerebral infarction affecting left non-dominant side: Secondary | ICD-10-CM | POA: Diagnosis not present

## 2023-10-24 DIAGNOSIS — E113299 Type 2 diabetes mellitus with mild nonproliferative diabetic retinopathy without macular edema, unspecified eye: Secondary | ICD-10-CM | POA: Diagnosis not present

## 2023-10-24 DIAGNOSIS — G8929 Other chronic pain: Secondary | ICD-10-CM

## 2023-10-24 DIAGNOSIS — E1142 Type 2 diabetes mellitus with diabetic polyneuropathy: Secondary | ICD-10-CM | POA: Diagnosis not present

## 2023-10-24 DIAGNOSIS — I480 Paroxysmal atrial fibrillation: Secondary | ICD-10-CM | POA: Diagnosis not present

## 2023-10-24 DIAGNOSIS — K769 Liver disease, unspecified: Secondary | ICD-10-CM | POA: Diagnosis not present

## 2023-10-24 NOTE — Telephone Encounter (Signed)
Called and spoke with crystal RN with [facility], VO given

## 2023-10-24 NOTE — Telephone Encounter (Signed)
Copied from CRM [id number]. Topic: Referral - Request for Referral >> Oct 24, 2023 10:42 AM Ernst Spell wrote: Did the patient discuss referral with their provider in the last year? Yes   Appointment offered? YES  Type of order/referral and detailed reason for visit: PAIN MANAGEMENT  Preference of office, provider, location: pt stated the location should be in his records  If referral order, have you been seen by this specialty before? No  Can we respond through MyChart? Yes

## 2023-10-27 LAB — URINALYSIS, ROUTINE W REFLEX MICROSCOPIC
Bilirubin, UA: NEGATIVE
Glucose, UA: NEGATIVE
Ketones, UA: NEGATIVE
Nitrite, UA: NEGATIVE
RBC, UA: NEGATIVE
Specific Gravity, UA: 1.025 (ref 1.005–1.030)
Urobilinogen, Ur: 0.2 mg/dL (ref 0.2–1.0)
pH, UA: 5.5 (ref 5.0–7.5)

## 2023-10-27 LAB — MICROSCOPIC EXAMINATION

## 2023-10-27 NOTE — Telephone Encounter (Signed)
Ok for referral?

## 2023-10-27 NOTE — Telephone Encounter (Signed)
Referral has been placed patient is aware

## 2023-10-29 ENCOUNTER — Ambulatory Visit: Payer: Self-pay | Admitting: Family Medicine

## 2023-10-29 NOTE — Telephone Encounter (Signed)
Copied from CRM [id number]. Topic: Clinical - Medication Question >> Oct 28, 2023  1:15 PM Larwance Sachs wrote: Reason for CRM: Patient is calling in to clarify if it would be okay to take aleve over the counter medication with prescibed XARELTO 20 MG TABS tablet Please follow up with patient regarding this at [phone] (H)   Chief Complaint: left knee pain Symptoms: 10/10 intermittent spasm pain Frequency: worsened over the past 6 months Pertinent Negatives: Patient denies redness or swelling in knee Disposition: [] ED /[] Urgent Care (no appt availability in office) / [] Appointment(In office/virtual)/ []  [facility]/ [] Home Care/ [x] Refused Recommended Disposition /[] [facility]/ []  Follow-up with PCP Additional Notes: The patient has a history of a stroke 2 years ago.  Since then his left knee spasms.  Over the past 6 months the pain has intensified and is unrelieved by over the counter medicine such as Tylenol.  The pain comes and goes and is 10/10.  He inquired if he could take Aleve but was told that it could increase his risk of bleeding since he is also taking Xarelto. He inquired if there are any prescription pain management options because he is "miserable sitting there in pain."  He was advised to be seen in the office today for further evaluation.  He stated that since Dr. Salomon Fick is so familiar with his history, he would feel more comfortable seeing her.  Her earliest available is Monday.  Routed to pcp to further advise.     Reason for Disposition  [1] SEVERE pain (e.g., excruciating, unable to walk) AND [2] not improved after 2 hours of pain medicine  Answer Assessment - Initial Assessment Questions 1. LOCATION and RADIATION: "Where is the pain located?"      Left knee 2. QUALITY: "What does the pain feel like?"  (e.g., sharp, dull, aching, burning)     spasm 3. SEVERITY: "How bad is the pain?" "What does it keep you from doing?"   (Scale 1-10; or mild, moderate,  severe)   -  MILD (1-3): doesn't interfere with normal activities    -  MODERATE (4-7): interferes with normal activities (e.g., work or school) or awakens from sleep, limping    -  SEVERE (8-10): excruciating pain, unable to do any normal activities, unable to walk     10/10 intermittently  4. ONSET: "When did the pain start?" "Does it come and go, or is it there all the time?"     Comes and goes last up to 3 hours  5. RECURRENT: "Have you had this pain before?" If Yes, ask: "When, and what happened then?"     2 years 6. SETTING: "Has there been any recent work, exercise or other activity that involved that part of the body?"      No  8. ASSOCIATED SYMPTOMS: "Is there any swelling or redness of the knee?"     None  9. OTHER SYMPTOMS: "Do you have any other symptoms?" (e.g., chest pain, difficulty breathing, fever, calf pain)     Edema of left leg and blisters unrelated to knee pain  Protocols used: Knee Pain-A-AH

## 2023-10-30 ENCOUNTER — Ambulatory Visit: Payer: Self-pay | Admitting: Family Medicine

## 2023-10-30 NOTE — Telephone Encounter (Signed)
   Chief Complaint: sores Symptoms: open sores on left leg, severe left knee pain Frequency: "at least the past 2 months" Pertinent Negatives: Patient denies fever, sob,  Disposition: [] ED /[] Urgent Care (no appt availability in office) / [x] Appointment(In office/virtual)/ []  [facility]/ [] Home Care/ [x] Refused Recommended Disposition /[] [facility]/ []  Follow-up with PCP Additional Notes: Patient reports he has 10/10 severe left knee pain, as well as two palm size open wounds that are painful and red on his left leg. Patient denies fever and sob. Patient reports this has been ongoing for at least the past 2 months, but the pain has been much worse this last week. Per protocol, this RN attempted to schedule in office appt. Pt reports he will not see anyone other than Dr. Salomon Fick. No availability for Dr. Salomon Fick until next week. Patient requesting to speak with Dr.Banks directly to get him an appt with her. This RN advised she would forward information to appropriate person but advised patient that if his symptoms worsen in the meantime he should call back for sooner appt with other provider or go to UC. Patient verbalized understanding but states he is still only willing to see Dr. Salomon Fick.     Copied from CRM [id number]. Topic: Clinical - Red Word Triage >> Oct 30, 2023  3:57 PM Larwance Sachs wrote: Patient/patient representative is calling to schedule an appointment. Patient states he was called to get scheduled and he believes it was by triage nurse, patient states having open sores on left side of left leg from knee to ankles- are burning, having knee pain on left knee. Having pain in side and did want to confirm if it was regarding gallbladder Reason for Disposition  Large sore (> 1 inch or 2.5 cm across)  Answer Assessment - Initial Assessment Questions 1. APPEARANCE of SORES: "What do the sores look like?"     Open sores 2. NUMBER: "How many sores are there?"     2 3. SIZE:  "How big is the largest sore?"     Palm of your hand 4. LOCATION: "Where are the sores located?"     Left side of leg 5. ONSET: "When did the sores begin?"     "They've been there for a while, maybe 2 months" 6. TENDER: "Does it hurt when you touch it?"  (Scale 1-10; or mild, moderate, severe)      moderate 7. CAUSE: "What do you think is causing the sores?"     Blisters that popped 8. OTHER SYMPTOMS: "Do you have any other symptoms?" (e.g., fever, new weakness)     Left knee pain 10/10  Protocols used: Sores-A-AH

## 2023-10-31 DIAGNOSIS — Z8673 Personal history of transient ischemic attack (TIA), and cerebral infarction without residual deficits: Secondary | ICD-10-CM | POA: Diagnosis not present

## 2023-10-31 DIAGNOSIS — R188 Other ascites: Secondary | ICD-10-CM | POA: Diagnosis not present

## 2023-10-31 DIAGNOSIS — I10 Essential (primary) hypertension: Secondary | ICD-10-CM | POA: Diagnosis not present

## 2023-10-31 DIAGNOSIS — K766 Portal hypertension: Secondary | ICD-10-CM | POA: Diagnosis not present

## 2023-10-31 DIAGNOSIS — I480 Paroxysmal atrial fibrillation: Secondary | ICD-10-CM | POA: Diagnosis not present

## 2023-10-31 DIAGNOSIS — K625 Hemorrhage of anus and rectum: Secondary | ICD-10-CM | POA: Diagnosis not present

## 2023-10-31 DIAGNOSIS — Z794 Long term (current) use of insulin: Secondary | ICD-10-CM | POA: Diagnosis not present

## 2023-10-31 DIAGNOSIS — R319 Hematuria, unspecified: Secondary | ICD-10-CM | POA: Diagnosis not present

## 2023-10-31 DIAGNOSIS — Z7901 Long term (current) use of anticoagulants: Secondary | ICD-10-CM | POA: Diagnosis not present

## 2023-10-31 DIAGNOSIS — R58 Hemorrhage, not elsewhere classified: Secondary | ICD-10-CM | POA: Diagnosis not present

## 2023-10-31 DIAGNOSIS — K7031 Alcoholic cirrhosis of liver with ascites: Secondary | ICD-10-CM | POA: Diagnosis not present

## 2023-10-31 DIAGNOSIS — R161 Splenomegaly, not elsewhere classified: Secondary | ICD-10-CM | POA: Diagnosis not present

## 2023-10-31 DIAGNOSIS — K7460 Unspecified cirrhosis of liver: Secondary | ICD-10-CM | POA: Diagnosis not present

## 2023-10-31 DIAGNOSIS — K649 Unspecified hemorrhoids: Secondary | ICD-10-CM | POA: Diagnosis not present

## 2023-10-31 DIAGNOSIS — E119 Type 2 diabetes mellitus without complications: Secondary | ICD-10-CM | POA: Diagnosis not present

## 2023-11-03 ENCOUNTER — Encounter: Payer: Self-pay | Admitting: Physical Medicine & Rehabilitation

## 2023-11-04 ENCOUNTER — Encounter: Payer: Medicare Other | Attending: Physical Medicine & Rehabilitation | Admitting: Physical Medicine & Rehabilitation

## 2023-11-04 ENCOUNTER — Telehealth: Payer: Self-pay

## 2023-11-04 ENCOUNTER — Encounter: Payer: Self-pay | Admitting: Physical Medicine & Rehabilitation

## 2023-11-04 VITALS — BP 116/79 | HR 85 | Ht 72.0 in

## 2023-11-04 DIAGNOSIS — M545 Low back pain, unspecified: Secondary | ICD-10-CM | POA: Diagnosis not present

## 2023-11-04 DIAGNOSIS — G8929 Other chronic pain: Secondary | ICD-10-CM | POA: Insufficient documentation

## 2023-11-04 DIAGNOSIS — R609 Edema, unspecified: Secondary | ICD-10-CM | POA: Insufficient documentation

## 2023-11-04 DIAGNOSIS — I69354 Hemiplegia and hemiparesis following cerebral infarction affecting left non-dominant side: Secondary | ICD-10-CM | POA: Insufficient documentation

## 2023-11-04 DIAGNOSIS — R252 Cramp and spasm: Secondary | ICD-10-CM | POA: Diagnosis not present

## 2023-11-04 DIAGNOSIS — M2450 Contracture, unspecified joint: Secondary | ICD-10-CM | POA: Insufficient documentation

## 2023-11-04 DIAGNOSIS — M25562 Pain in left knee: Secondary | ICD-10-CM | POA: Diagnosis not present

## 2023-11-04 MED ORDER — BACLOFEN 10 MG PO TABS: 5.0000 mg | ORAL_TABLET | Freq: Three times a day (TID) | ORAL | 0 refills | Status: DC

## 2023-11-04 MED ORDER — GABAPENTIN 100 MG PO CAPS: 100.0000 mg | ORAL_CAPSULE | Freq: Two times a day (BID) | ORAL | 1 refills | Status: AC

## 2023-11-04 NOTE — Telephone Encounter (Signed)
There are 2 openings on schedule for 4:30 pm on 2/5 amd 2/6 this wk.  If this works for pt can schedule then.

## 2023-11-04 NOTE — Transitions of Care (Post Inpatient/ED Visit) (Signed)
 11/04/2023  Name: Jimmy Carr MRN: [id number] DOB: January 25, 1941  Today's TOC FU Call Status: Today's TOC FU Call Status:: Successful TOC FU Call Completed TOC FU Call Complete Date: 11/04/23 Patient's Name and Date of Birth confirmed.  Transition Care Management Follow-up Telephone Call Date of Discharge: 10/31/23 Discharge Facility: Other ([facility]) Name of Other ([facility]) Discharge Facility: [facility] Type of Discharge: Emergency Department Reason for ED Visit: Other: (rectal bleeding) How have you been since you were released from the hospital?: Better Any questions or concerns?: No  Items Reviewed: Did you receive and understand the discharge instructions provided?: Yes Medications obtained,verified, and reconciled?: Yes (Medications Reviewed) Dietary orders reviewed?: NA Do you have support at home?: Yes People in Home: spouse  Medications Reviewed Today: Medications Reviewed Today     Reviewed by Lynzi Meulemans, [place] LABOR, CMA (Certified Medical Assistant) on 11/04/23 at 1453  Med List Status: <None>   Medication Order Taking? Sig Documenting Provider Last Dose Status Informant  0.9 %  sodium chloride  infusion [id number]   Aneita Gwendlyn ONEIDA, MD  Active   acetaminophen  (TYLENOL ) 500 MG tablet [id number] No Take 500 mg by mouth every 4 (four) hours as needed. [provider] Taking Active   baclofen  (LIORESAL ) 10 MG tablet [id number]  Take 0.5 tablets (5 mg total) by mouth 3 (three) times daily. Urbano Albright, MD  Active   Blood Glucose Monitoring Suppl (ACCU-CHEK AVIVA PLUS) w/Device KIT [id number] No USED TO CHECK BLOOD SUGAR 3 TIMES DAILY OR PRN. Jame Maude FALCON, MD Taking Active   carvedilol  (COREG ) 6.25 MG tablet [id number] No Take 1 tablet (6.25 mg total) by mouth 2 (two) times daily with a meal. Hilty, Vinie BROCKS, MD Taking Active   diclofenac  Sodium (VOLTAREN ) 1 % GEL [id number] No Apply 2 g topically as needed. [provider]  Taking Active   furosemide  (LASIX ) 40 MG tablet [id number] No TAKE 1 TABLET(40 MG) BY MOUTH DAILY Hilty, Vinie BROCKS, MD Taking Active   gabapentin  (NEURONTIN ) 100 MG capsule [id number]  Take 1-2 capsules (100-200 mg total) by mouth 2 (two) times daily. Urbano Albright, MD  Active   Gauze Bandages [facility] II GAUZE 4.5X4.1YD) MISC [id number] No 1 Box by Does not apply route 3 (three) times a week. Mercer Clotilda SAUNDERS, MD Taking Active   Gauze Pads & Dressings [facility] ABDOMINAL) 5X9 PADS [id number] No Apply 1 Box topically 3 (three) times a week. Mercer Clotilda SAUNDERS, MD Taking Active   glucose blood (PRODIGY NO CODING BLOOD GLUC) test strip [id number] No Use as instructed to check blood sugars three times daily E11.65, Z79.4 Mercer Clotilda SAUNDERS, MD Taking Active   insulin  lispro (HUMALOG ) 100 UNIT/ML KwikPen [id number] No INJECT 50 UNITS UNDER THE SKIN AT BREAKFAST, 35 UNITS WITH LUNCH, AND 50 UNITS WITH SUPPER Mercer Clotilda SAUNDERS, MD Taking Active   Insulin  Pen Needle (B-D ULTRAFINE III SHORT PEN) 31G X 8 MM MISC [id number] No USE FOUR TIMES DAILY BEFORE MEALS AND AT BEDTIME Mercer Clotilda SAUNDERS, MD Taking Active   LANTUS  SOLOSTAR 100 UNIT/ML Solostar Pen [id number] No ADMINISTER 60 UNITS UNDER THE SKIN AT 10 PM Mercer Clotilda SAUNDERS, MD Taking Active   latanoprost (XALATAN) 0.005 % ophthalmic solution [id number] No 1 drop daily.  [provider] Taking Active   lisinopril  (ZESTRIL ) 40 MG tablet [id number] No TAKE 1 TABLET(40 MG) BY MOUTH DAILY Mercer Clotilda SAUNDERS, MD Taking Active   lovastatin  (MEVACOR ) 40 MG tablet [id number] No TAKE 1 TABLET(40 MG) BY MOUTH AT  BEDTIME Mercer Clotilda SAUNDERS, MD Taking Active   metFORMIN  (GLUCOPHAGE ) 1000 MG tablet [id number] No TAKE 1 TABLET(1000 MG) BY MOUTH TWICE DAILY WITH A MEAL Mercer Clotilda SAUNDERS, MD Taking Active   nystatin  cream (MYCOSTATIN ) [id number] No APPLY TOPICALLY TO THE AFFECTED AREA TWICE DAILY Mercer Clotilda SAUNDERS, MD Taking Active   nystatin  powder [id number] No APPLY TOPICALLY THREE  TIMES DAILY Mercer Clotilda SAUNDERS, MD Taking Active   pantoprazole (PROTONIX) 40 MG tablet [id number] No Take 40 mg by mouth daily. [provider] Taking Active   potassium chloride  SA (KLOR-CON  M) 20 MEQ tablet [id number] No TAKE 1 TABLET BY MOUTH DAILY WITH HYDROCHLOROTHIAZIDE  TO PREVENT LOW POTASSIUM Mercer Clotilda SAUNDERS, MD Taking Active   Prodigy Twist Top Lancets 28G MISC [id number] No USE AS DIRECTED TO TEST THREE TIMES DAILY Mercer Clotilda SAUNDERS, MD Taking Active   rOPINIRole  (REQUIP ) 1 MG tablet [id number] No Take 1 tablet (1 mg total) by mouth 3 (three) times daily. Mercer Clotilda SAUNDERS, MD Taking Active   timolol  (TIMOPTIC -XR) 0.5 % ophthalmic gel-forming [id number] No Place 1 drop into both eyes daily.  [provider] Taking Active   XARELTO  20 MG TABS tablet [id number] No TAKE 1 TABLET BY MOUTH EVERY EVENING Mercer Clotilda SAUNDERS, MD Taking Active             Home Care and Equipment/Supplies: Were Home Health Services Ordered?: NA Any new equipment or medical supplies ordered?: NA  Functional Questionnaire: Do you need assistance with bathing/showering or dressing?: Yes (pt states he is confined to a wheelchair wife and daughter help him with ADL's) Do you need assistance with meal preparation?: Yes Do you need assistance with eating?: No Do you have difficulty maintaining continence: No Do you need assistance with getting out of bed/getting out of a chair/moving?: Yes Do you have difficulty managing or taking your medications?: No  Follow up appointments reviewed: PCP Follow-up appointment confirmed?: No MD Provider Line Number:[phone] Given: Yes Specialist Hospital Follow-up appointment confirmed?: No Reason Specialist Follow-Up Not Confirmed: Patient has Specialist Provider Number and will Call for Appointment Do you need transportation to your follow-up appointment?: No Do you understand care options if your condition(s) worsen?: Yes-patient verbalized  understanding    Darianne Muralles, CMA  [facility] AWV Team Direct Dial : [phone]

## 2023-11-04 NOTE — Telephone Encounter (Signed)
Would advised against taking NSAIDs such as Aleve.

## 2023-11-04 NOTE — Telephone Encounter (Signed)
Called and spoke with patient, he seen pain Mang today and they have changed meds, patient would like to wait a week or 2 to see how meds are working

## 2023-11-04 NOTE — Telephone Encounter (Signed)
 Noted

## 2023-11-04 NOTE — Progress Notes (Signed)
 Subjective:    Patient ID: Jimmy Carr, male    DOB: Feb 26, 1941, 83 y.o.   MRN: [id number]  HPI   HPI  Jimmy Carr is a 83 y.o. year old male  who  has a past medical history of AF (atrial fibrillation) (HCC), Bruit, Cerebrovascular accident (HCC) (2003), Diabetes mellitus type II, Glaucoma, Hyperlipidemia, Hypertension, Other specified cardiac dysrhythmias(427.89), and Peripheral neuropathy.   They are presenting to PM&R clinic as a new patient for pain management evaluation. They were referred by Dr. Mercer for treatment of left knee and lower back pain.  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  Patient reports he was able to walk with a walker for short distances initially after his stroke but over time his function worsened to now where he uses a wheelchair primarily for his mobility.  He requires help from his wife to transfer.  Patient reports left knee pain has been present since this time but has worsened about 6 months ago.  Pain feels like a vice around his knee, stabbing, and sometimes with muscle spasms.  Reports pain primarily occurs at 6 AM and improves a few hours later.  It will occasionally occur throughout the day.  He takes his Robaxin  around 8 AM.  Patient says he recently injured his back when he was being transferred between beds staff at a [facility].  This pain is gradually improving.  Pain is across his lower back.  He has previously but orthopedics for back pain, treated with short course of oral steroids with improvement.  Patient reports his back pain was under control overall until recent incident.  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  Red flag symptoms: No red flags for back pain endorsed in Hx or ROS  Medications tried: Topical medications - Voltaren  gel helps knee pain  Nsaids - Unable to take due to medical history Tylenol   - Helps a little   Opiates - Denies recent use other then at the hospital  Gabapentin - 100mg  QHS- some benefit  TCAs  - Denies  SNRIs - Denies  Other  Prednisone  helped for short time  Robaxin - helps a little  Ropinirole - helps a little   Other treatments: PT- After CVA TENs unit - Denies    Prior UDS results: No results found for: LABOPIA, COCAINSCRNUR, LABBENZ, AMPHETMU, THCU, LABBARB    Pain Inventory Average Pain 10 Pain Right Now 8 My pain is intermittent, sharp, burning, dull, stabbing, tingling, and aching  In the last 24 hours, has pain interfered with the following? General activity 10 Relation with others 0 Enjoyment of life 10 What TIME of day is your pain at its worst? morning  Sleep (in general) Poor  Pain is worse with: unsure Pain improves with: medication Relief from Meds: 4  use a wheelchair needs help with transfers Do you have any goals in this area?  yes  disabled: date disabled 2010? I need assistance with the following:  dressing, bathing, toileting, meal prep, household duties, and shopping Do you have any goals in this area?  yes  weakness numbness trouble walking spasms anxiety  Any changes since last visit?  no  Any changes since last visit?  no    Family History  Problem Relation Age of Onset   Diabetes Neg Hx    Colon cancer Neg Hx    Social History   Socioeconomic History  Marital status: Married    Spouse name: Not on file   Number of children: Not on file   Years of education: Not on file   Highest education level: Master's degree (e.g., MA, MS, MEng, MEd, MSW, MBA)  Occupational History   Not on file  Tobacco Use   Smoking status: Never   Smokeless tobacco: Never  Vaping Use   Vaping status: Never Used  Substance and Sexual Activity   Alcohol use: No   Drug use: No   Sexual activity: Not on file  Other Topics Concern   Not on file  Social History Narrative   Non smoker    On disability   Social Drivers of  Health   Financial Resource Strain: Low Risk  (09/16/2023)   Overall Financial Resource Strain (CARDIA)    Difficulty of Paying Living Expenses: Not very hard  Food Insecurity: No Food Insecurity (09/16/2023)   Hunger Vital Sign    Worried About Running Out of Food in the Last Year: Never true    Ran Out of Food in the Last Year: Never true  Transportation Needs: No Transportation Needs (09/16/2023)   PRAPARE - Administrator, Civil Service (Medical): No    Lack of Transportation (Non-Medical): No  Physical Activity: Unknown (09/16/2023)   Exercise Vital Sign    Days of Exercise per Week: 0 days    Minutes of Exercise per Session: Not on file  Stress: No Stress Concern Present (09/16/2023)   [employer] of Occupational Health - Occupational Stress Questionnaire    Feeling of Stress : Only a little  Social Connections: Socially Isolated (09/16/2023)   Social Connection and Isolation Panel [NHANES]    Frequency of Communication with Friends and Family: Once a week    Frequency of Social Gatherings with Friends and Family: Once a week    Attends Religious Services: Never    Database Administrator or Organizations: No    Attends Engineer, Structural: Not on file    Marital Status: Married   Past Surgical History:  Procedure Laterality Date   ANAL FISSURE REPAIR     CARDIAC CATHETERIZATION  02/02/2001   Normal LV systolic function, questionable mitral valve prolapse without mitral regurgitation, no evidence of CAD   CARDIAC CATHETERIZATION  09/13/2008   Minimal CAD, normal LV systolic function, no evidence of renal artery stenosis, distal aortic disease, or iliac disease   Past Medical History:  Diagnosis Date   AF (atrial fibrillation) (HCC)    2D ECHO, 03/01/1987 - normal; NUCLEAR STRESS TEST, 04/10/2006 - EF 65%, no ischemia   Bruit    CAROTID DOPPLER, 09/20/2008 - normal, no evidence of diameter reduction, significant tortuousity or any other vascular  abnormality   Cerebrovascular accident [facility]) 2003   history of small vessal lacunar stroke 2003   Diabetes mellitus type II    Glaucoma    Hyperlipidemia    Hypertension    Other specified cardiac dysrhythmias(427.89)    PSVT   Peripheral neuropathy    There were no vitals taken for this visit.  Opioid Risk Score:   Fall Risk Score:  `1  Depression screen [facility] 2/9     11/04/2023   10:48 AM 05/14/2023    9:09 AM 04/29/2023    3:03 PM 10/16/2022   11:30 AM 07/11/2022   11:21 AM 07/11/2022   11:20 AM 04/25/2022    1:02 PM  Depression screen PHQ 2/9  Decreased Interest 0 0 0 0  0 0 0  Down, Depressed, Hopeless 0 0 0 1 0 0 0  PHQ - 2 Score 0 0 0 1 0 0 0  Altered sleeping 0 0 0 0     Tired, decreased energy 0 0 0 1     Change in appetite 0 0 0 0     Feeling bad or failure about yourself  0 0 0 0     Trouble concentrating 0 0 0 0     Moving slowly or fidgety/restless 0 0 0 0     Suicidal thoughts 0 0 0 0     PHQ-9 Score 0 0 0 2     Difficult doing work/chores   Not difficult at all Not difficult at all       Review of Systems  Musculoskeletal:        Pain in both knees Spasms Pain in both feet  Skin:  Positive for wound.       Bilateral lower leg wounds  Neurological:  Positive for weakness and numbness.  Psychiatric/Behavioral:         Anxiety  All other systems reviewed and are negative.      Objective:   Physical Exam  Gen: no distress, normal appearing HEENT: oral mucosa pink and moist, NCAT Chest: normal effort, normal rate of breathing Abd: soft, non-distended Ext: B/L legs wrapped and dressing underneath, b/l LE edema  Psych: pleasant, normal affect Skin: intact Neuro: Alert and awake, follows commands, cranial nerves II through XII grossly intact, normal speech and language RUE: 5/5 Deltoid, 5/5 Biceps, 5/5 Triceps, 5/5 Wrist Ext, 5/5 Grip LUE: 0/5 LLE: HF 4-/5, KE 1-2/5, ADF 4-/5, APF 4-/5 RLE: HF 4-/5, KE 4-/5, ADF 4/5, APF 4/5 Sensory exam normal for  light touch and pain in all 4 limbs.   Musculoskeletal:  Contractures b/l knee- unable to get full extension Increased tone in LUE MAS 2-3 Wrist flexion contracture LUE Contracture R knee No L knee tenderness, no pain with ROM noted Mild TTP L spine  Xray L knee 2021 :IMPRESSION: No fracture or dislocation is seen.   Mild degenerative changes.    MRI L knee 2022 IMPRESSION:  1. Severe tendinosis and apparent partial tears of the distal quadriceps tendon with associated edema within the superior lateral aspect of the patella. Mild patellar tendinosis with prepatellar edema.  2.  Acute versus chronic sprain or previous partial tear of the ACL and proximal PCL with acute versus chronic sprain or degenerative signal of the proximal MCL and LCL.  3.  Small knee joint effusion with mild synovial proliferation throughout the joint and possible early medial plica formation.  4.  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  5.  Degenerative fraying and mild medial subluxation of the medial meniscus.   MRI L spine 2023  T12-L1 (sagittal images only): No spinal canal or neural foraminal stenosis.   L1-2 (sagittal images only): No spinal canal or neural foraminal stenosis.   L2-3: Mild-to-moderate DDD. Mild bilateral facet arthropathy. Mild spinal canal stenosis. No significant neural foraminal stenosis.   L3-4: Mild degenerative changes. No significant spinal canal or neural foraminal stenosis.   L4-5: Mild DDD, including a mild circumferential disc bulge. Mild-to-moderate bilateral facet arthropathy. Mild bilateral ligamentum flavum infolding. Moderate central zone stenosis. Moderate bilateral subarticular zone stenosis (left greater than right). Mild bilateral neural foraminal stenosis.   L5-S1: Mild DDD. Mild bilateral  facet arthropathy (left greater than right). Mild left subarticular  zone stenosis. Mild left neural foraminal stenosis. No significant central zone, right subarticular zone, or right neural foraminal stenosis.     Assessment & Plan:   1) Chronic Left knee pain.  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  May also be neuropathic component. -pain back pain reported to be after recent injury, improving.  Could consider repeat short-term steroid treatment -Continue gabapentin , adjust to 100-200 BID -Will DC robaxin , start baclofen  5mg  TID -TENS unit, Zynex Nexwave ordered

## 2023-11-05 NOTE — Telephone Encounter (Signed)
Called and spoke with patient he is aware.

## 2023-11-07 DIAGNOSIS — I69354 Hemiplegia and hemiparesis following cerebral infarction affecting left non-dominant side: Secondary | ICD-10-CM | POA: Diagnosis not present

## 2023-11-07 DIAGNOSIS — E113299 Type 2 diabetes mellitus with mild nonproliferative diabetic retinopathy without macular edema, unspecified eye: Secondary | ICD-10-CM | POA: Diagnosis not present

## 2023-11-07 DIAGNOSIS — E1142 Type 2 diabetes mellitus with diabetic polyneuropathy: Secondary | ICD-10-CM | POA: Diagnosis not present

## 2023-11-07 DIAGNOSIS — I890 Lymphedema, not elsewhere classified: Secondary | ICD-10-CM | POA: Diagnosis not present

## 2023-11-07 DIAGNOSIS — I480 Paroxysmal atrial fibrillation: Secondary | ICD-10-CM | POA: Diagnosis not present

## 2023-11-07 DIAGNOSIS — K769 Liver disease, unspecified: Secondary | ICD-10-CM | POA: Diagnosis not present

## 2023-11-11 DIAGNOSIS — I8500 Esophageal varices without bleeding: Secondary | ICD-10-CM | POA: Diagnosis not present

## 2023-11-11 DIAGNOSIS — R188 Other ascites: Secondary | ICD-10-CM | POA: Diagnosis not present

## 2023-11-11 DIAGNOSIS — K7460 Unspecified cirrhosis of liver: Secondary | ICD-10-CM | POA: Diagnosis not present

## 2023-11-14 DIAGNOSIS — K769 Liver disease, unspecified: Secondary | ICD-10-CM | POA: Diagnosis not present

## 2023-11-14 DIAGNOSIS — I69354 Hemiplegia and hemiparesis following cerebral infarction affecting left non-dominant side: Secondary | ICD-10-CM | POA: Diagnosis not present

## 2023-11-14 DIAGNOSIS — E113299 Type 2 diabetes mellitus with mild nonproliferative diabetic retinopathy without macular edema, unspecified eye: Secondary | ICD-10-CM | POA: Diagnosis not present

## 2023-11-14 DIAGNOSIS — I480 Paroxysmal atrial fibrillation: Secondary | ICD-10-CM | POA: Diagnosis not present

## 2023-11-14 DIAGNOSIS — I890 Lymphedema, not elsewhere classified: Secondary | ICD-10-CM | POA: Diagnosis not present

## 2023-11-14 DIAGNOSIS — E1142 Type 2 diabetes mellitus with diabetic polyneuropathy: Secondary | ICD-10-CM | POA: Diagnosis not present

## 2023-11-15 DIAGNOSIS — E1142 Type 2 diabetes mellitus with diabetic polyneuropathy: Secondary | ICD-10-CM | POA: Diagnosis not present

## 2023-11-15 DIAGNOSIS — I2510 Atherosclerotic heart disease of native coronary artery without angina pectoris: Secondary | ICD-10-CM | POA: Diagnosis not present

## 2023-11-15 DIAGNOSIS — K769 Liver disease, unspecified: Secondary | ICD-10-CM | POA: Diagnosis not present

## 2023-11-15 DIAGNOSIS — I10 Essential (primary) hypertension: Secondary | ICD-10-CM | POA: Diagnosis not present

## 2023-11-15 DIAGNOSIS — I890 Lymphedema, not elsewhere classified: Secondary | ICD-10-CM | POA: Diagnosis not present

## 2023-11-15 DIAGNOSIS — I480 Paroxysmal atrial fibrillation: Secondary | ICD-10-CM | POA: Diagnosis not present

## 2023-11-15 DIAGNOSIS — I69354 Hemiplegia and hemiparesis following cerebral infarction affecting left non-dominant side: Secondary | ICD-10-CM | POA: Diagnosis not present

## 2023-11-15 DIAGNOSIS — Z993 Dependence on wheelchair: Secondary | ICD-10-CM | POA: Diagnosis not present

## 2023-11-15 DIAGNOSIS — H401133 Primary open-angle glaucoma, bilateral, severe stage: Secondary | ICD-10-CM | POA: Diagnosis not present

## 2023-11-15 DIAGNOSIS — E113299 Type 2 diabetes mellitus with mild nonproliferative diabetic retinopathy without macular edema, unspecified eye: Secondary | ICD-10-CM | POA: Diagnosis not present

## 2023-11-21 DIAGNOSIS — E1142 Type 2 diabetes mellitus with diabetic polyneuropathy: Secondary | ICD-10-CM | POA: Diagnosis not present

## 2023-11-21 DIAGNOSIS — I480 Paroxysmal atrial fibrillation: Secondary | ICD-10-CM | POA: Diagnosis not present

## 2023-11-21 DIAGNOSIS — K769 Liver disease, unspecified: Secondary | ICD-10-CM | POA: Diagnosis not present

## 2023-11-21 DIAGNOSIS — I69354 Hemiplegia and hemiparesis following cerebral infarction affecting left non-dominant side: Secondary | ICD-10-CM | POA: Diagnosis not present

## 2023-11-21 DIAGNOSIS — I890 Lymphedema, not elsewhere classified: Secondary | ICD-10-CM | POA: Diagnosis not present

## 2023-11-21 DIAGNOSIS — E113299 Type 2 diabetes mellitus with mild nonproliferative diabetic retinopathy without macular edema, unspecified eye: Secondary | ICD-10-CM | POA: Diagnosis not present

## 2023-11-24 ENCOUNTER — Telehealth: Payer: Self-pay

## 2023-11-24 DIAGNOSIS — R188 Other ascites: Secondary | ICD-10-CM | POA: Diagnosis not present

## 2023-11-24 DIAGNOSIS — K7460 Unspecified cirrhosis of liver: Secondary | ICD-10-CM | POA: Diagnosis not present

## 2023-11-24 NOTE — Telephone Encounter (Signed)
 Spoke with Crystal RN OV given per Dr. Salomon Fick for Silver Alginate.

## 2023-11-24 NOTE — Telephone Encounter (Signed)
 Copied from CRM [id number]. Topic: Clinical - Medication Question >> Nov 24, 2023  9:00 AM Almira Coaster wrote: Reason for CRM: Crystal RN case manager with [facility] home health is calling because they are using alginate zero for patient lower extremity;however, she would like to know if it's okay to change to alginate silver. Best call back number is [phone]

## 2023-11-27 DIAGNOSIS — R188 Other ascites: Secondary | ICD-10-CM | POA: Diagnosis not present

## 2023-11-27 DIAGNOSIS — K828 Other specified diseases of gallbladder: Secondary | ICD-10-CM | POA: Diagnosis not present

## 2023-11-27 DIAGNOSIS — K7460 Unspecified cirrhosis of liver: Secondary | ICD-10-CM | POA: Diagnosis not present

## 2023-11-27 DIAGNOSIS — K760 Fatty (change of) liver, not elsewhere classified: Secondary | ICD-10-CM | POA: Diagnosis not present

## 2023-11-28 DIAGNOSIS — E1142 Type 2 diabetes mellitus with diabetic polyneuropathy: Secondary | ICD-10-CM | POA: Diagnosis not present

## 2023-11-28 DIAGNOSIS — K769 Liver disease, unspecified: Secondary | ICD-10-CM | POA: Diagnosis not present

## 2023-11-28 DIAGNOSIS — I480 Paroxysmal atrial fibrillation: Secondary | ICD-10-CM | POA: Diagnosis not present

## 2023-11-28 DIAGNOSIS — I69354 Hemiplegia and hemiparesis following cerebral infarction affecting left non-dominant side: Secondary | ICD-10-CM | POA: Diagnosis not present

## 2023-11-28 DIAGNOSIS — E113299 Type 2 diabetes mellitus with mild nonproliferative diabetic retinopathy without macular edema, unspecified eye: Secondary | ICD-10-CM | POA: Diagnosis not present

## 2023-11-28 DIAGNOSIS — I890 Lymphedema, not elsewhere classified: Secondary | ICD-10-CM | POA: Diagnosis not present

## 2023-12-04 ENCOUNTER — Ambulatory Visit (INDEPENDENT_AMBULATORY_CARE_PROVIDER_SITE_OTHER): Admitting: Family Medicine

## 2023-12-04 ENCOUNTER — Encounter: Payer: Self-pay | Admitting: Family Medicine

## 2023-12-04 VITALS — BP 124/76 | HR 92 | Temp 97.7°F | Ht 72.0 in

## 2023-12-04 DIAGNOSIS — J302 Other seasonal allergic rhinitis: Secondary | ICD-10-CM

## 2023-12-04 DIAGNOSIS — R600 Localized edema: Secondary | ICD-10-CM | POA: Diagnosis not present

## 2023-12-04 DIAGNOSIS — K7460 Unspecified cirrhosis of liver: Secondary | ICD-10-CM

## 2023-12-04 DIAGNOSIS — I890 Lymphedema, not elsewhere classified: Secondary | ICD-10-CM | POA: Diagnosis not present

## 2023-12-04 DIAGNOSIS — Z794 Long term (current) use of insulin: Secondary | ICD-10-CM | POA: Diagnosis not present

## 2023-12-04 DIAGNOSIS — I69354 Hemiplegia and hemiparesis following cerebral infarction affecting left non-dominant side: Secondary | ICD-10-CM

## 2023-12-04 DIAGNOSIS — E1140 Type 2 diabetes mellitus with diabetic neuropathy, unspecified: Secondary | ICD-10-CM | POA: Diagnosis not present

## 2023-12-04 DIAGNOSIS — R0982 Postnasal drip: Secondary | ICD-10-CM

## 2023-12-04 DIAGNOSIS — R051 Acute cough: Secondary | ICD-10-CM | POA: Diagnosis not present

## 2023-12-04 DIAGNOSIS — R252 Cramp and spasm: Secondary | ICD-10-CM | POA: Diagnosis not present

## 2023-12-04 DIAGNOSIS — Z8673 Personal history of transient ischemic attack (TIA), and cerebral infarction without residual deficits: Secondary | ICD-10-CM

## 2023-12-04 LAB — POCT GLYCOSYLATED HEMOGLOBIN (HGB A1C): Hemoglobin A1C: 5.7 % — AB (ref 4.0–5.6)

## 2023-12-05 DIAGNOSIS — E1142 Type 2 diabetes mellitus with diabetic polyneuropathy: Secondary | ICD-10-CM | POA: Diagnosis not present

## 2023-12-05 DIAGNOSIS — I480 Paroxysmal atrial fibrillation: Secondary | ICD-10-CM | POA: Diagnosis not present

## 2023-12-05 DIAGNOSIS — E113299 Type 2 diabetes mellitus with mild nonproliferative diabetic retinopathy without macular edema, unspecified eye: Secondary | ICD-10-CM | POA: Diagnosis not present

## 2023-12-05 DIAGNOSIS — I890 Lymphedema, not elsewhere classified: Secondary | ICD-10-CM | POA: Diagnosis not present

## 2023-12-05 DIAGNOSIS — I69354 Hemiplegia and hemiparesis following cerebral infarction affecting left non-dominant side: Secondary | ICD-10-CM | POA: Diagnosis not present

## 2023-12-05 DIAGNOSIS — K769 Liver disease, unspecified: Secondary | ICD-10-CM | POA: Diagnosis not present

## 2023-12-08 ENCOUNTER — Ambulatory Visit: Payer: Self-pay | Admitting: Family Medicine

## 2023-12-08 ENCOUNTER — Other Ambulatory Visit: Payer: Self-pay | Admitting: Internal Medicine

## 2023-12-08 ENCOUNTER — Telehealth (INDEPENDENT_AMBULATORY_CARE_PROVIDER_SITE_OTHER): Admitting: Family Medicine

## 2023-12-08 ENCOUNTER — Encounter: Payer: Self-pay | Admitting: Family Medicine

## 2023-12-08 DIAGNOSIS — R188 Other ascites: Secondary | ICD-10-CM | POA: Diagnosis not present

## 2023-12-08 DIAGNOSIS — I69354 Hemiplegia and hemiparesis following cerebral infarction affecting left non-dominant side: Secondary | ICD-10-CM

## 2023-12-08 DIAGNOSIS — I69398 Other sequelae of cerebral infarction: Secondary | ICD-10-CM | POA: Diagnosis not present

## 2023-12-08 DIAGNOSIS — M79602 Pain in left arm: Secondary | ICD-10-CM | POA: Diagnosis not present

## 2023-12-08 DIAGNOSIS — K7460 Unspecified cirrhosis of liver: Secondary | ICD-10-CM | POA: Diagnosis not present

## 2023-12-08 DIAGNOSIS — R252 Cramp and spasm: Secondary | ICD-10-CM

## 2023-12-08 MED ORDER — BACLOFEN 10 MG PO TABS
5.0000 mg | ORAL_TABLET | Freq: Three times a day (TID) | ORAL | 4 refills | Status: AC
Start: 2023-12-08 — End: ?

## 2023-12-08 NOTE — Progress Notes (Signed)
 Established Patient Office Visit   Subjective  Patient ID: Jimmy Carr, male    DOB: 02/04/1941  Age: 83 y.o. MRN: [id number]  Chief Complaint  Patient presents with   mucus    Sinus drainage, mucus and wheezing, started a 1 1/2 weeks ago   Pt accompanied by his wife.  Patient is an 83 year old male seen for acute concern.  Patient with increased sinus drainage starting 1.5 weeks ago.  Endorses productive cough worse at night with wheezing.  Noticed some improvement with allergy medicine but was not sure if could be taken regularly.  Patient notes improvement in left knee pain with baclofen 10 mg half tab 3 times daily.  Patient's wife inquires if this provider can take over prescription.  Patient notes improvement in LE edema.  Continues to have edema of penile shaft.  Seen by urology.  Symptoms thought 2/2 lymphedema.  Since last OFV patient seen in ED for rectal bleeding.  Also seen by GI and found to have cirrhosis of the liver.  Has upcoming labs to monitor renal function to see if Lasix can be adjusted or switched.    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 (HCC) 05/11/2015   Cataract, nuclear sclerotic senile, bilateral 10/04/2014   Primary open angle glaucoma of both eyes, severe stage 02/14/2014   Chest pain 11/02/2013   CAD in native artery 11/02/2013   PAF (paroxysmal atrial fibrillation) (HCC) 03/16/2013    Erectile dysfunction 03/11/2011   SHINGLES 03/28/2010   URINARY FREQUENCY 11/07/2009   RUQ PAIN 01/24/2009   Diabetes mellitus with neuropathy (HCC) 02/24/2007   Hyperlipidemia 02/24/2007   PERIPHERAL NEUROPATHY 02/24/2007   Essential hypertension 02/24/2007   History of cardiovascular disorder 02/24/2007   Past Medical History:  Diagnosis Date   AF (atrial fibrillation) (HCC)    2D ECHO, 03/01/1987 - normal; NUCLEAR STRESS TEST, 04/10/2006 - EF 65%, no ischemia   Bruit    CAROTID DOPPLER, 09/20/2008 - normal, no evidence of diameter reduction, significant tortuousity or any other vascular abnormality   Cerebrovascular accident [facility]) 2003   history of small vessal lacunar stroke 2003   Diabetes mellitus type II    Glaucoma    Hyperlipidemia    Hypertension    Other specified cardiac dysrhythmias(427.89)    PSVT   Peripheral neuropathy    Past Surgical History:  Procedure Laterality Date   ANAL FISSURE REPAIR     CARDIAC CATHETERIZATION  02/02/2001   Normal LV systolic function, questionable mitral valve prolapse without mitral regurgitation, no evidence of CAD   CARDIAC CATHETERIZATION  09/13/2008   Minimal CAD, normal LV systolic function, no evidence of renal artery stenosis, distal aortic disease, or iliac disease   Social History   Tobacco Use   Smoking status: Never   Smokeless tobacco: Never  Vaping Use   Vaping status: Never Used  Substance Use Topics   Alcohol use: No   Drug use: No   Family  History  Problem Relation Age of Onset   Diabetes Neg Hx    Colon cancer Neg Hx    Allergies  Allergen Reactions   Cheratussin Ac [Guaifenesin-Codeine] Nausea And Vomiting   Verapamil       ROS Negative unless stated above    Objective:     BP 124/76 (BP Location: Left Arm, Patient Position: Sitting, Cuff Size: Normal)   Pulse 92   Temp 97.7 F (36.5 C) (Oral)   Ht 6' (1.829 m)   SpO2 98%   BMI 38.65 kg/m  BP Readings from Last 3 Encounters:  12/04/23  124/76  11/04/23 116/79  10/23/23 133/77   Wt Readings from Last 3 Encounters:  10/23/23 285 lb (129.3 kg)  05/14/23 285 lb (129.3 kg)  04/29/23 285 lb (129.3 kg)    Physical Exam Constitutional:      General: He is not in acute distress.    Appearance: Normal appearance.  HENT:     Head: Normocephalic and atraumatic.     Nose: Nose normal.     Mouth/Throat:     Mouth: Mucous membranes are moist.  Cardiovascular:     Rate and Rhythm: Normal rate and regular rhythm.     Heart sounds: Normal heart sounds. No murmur heard.    No gallop.     Comments: Bilateral LEs wrapped. Pulmonary:     Effort: Pulmonary effort is normal. No respiratory distress.     Breath sounds: Normal breath sounds. No wheezing, rhonchi or rales.  Abdominal:     General: Abdomen is protuberant. There is distension.     Palpations: There is fluid wave.     Comments: Abdomen distended, taut.  Musculoskeletal:     Right lower leg: Edema present.     Left lower leg: Edema present.  Skin:    General: Skin is warm and dry.  Neurological:     Mental Status: He is alert and oriented to person, place, and time. Mental status is at baseline.     Cranial Nerves: Cranial nerve deficit present.     Motor: Weakness present.     Comments: Left-sided hemiparesis.  Sitting in transport wheelchair.  Gait not assessed.      Results for orders placed or performed in visit on 12/04/23  POC HgB A1c  Result Value Ref Range   Hemoglobin A1C 5.7 (A) 4.0 - 5.6 %   HbA1c POC (<> result, manual entry)     HbA1c, POC (prediabetic range)     HbA1c, POC (controlled diabetic range)        Assessment & Plan:  Seasonal allergies  Lymphedema of penis  Cirrhosis of liver with ascites, unspecified hepatic cirrhosis type (HCC) -Continue follow-up with GI at [facility]  Acute cough -Likely 2/2 postnasal drainage due to allergy symptoms. Post-nasal drainage  Type 2 diabetes mellitus with diabetic neuropathy, with long-term  current use of insulin (HCC) -POC hemoglobin A1c 5.7% this visit -Continue metformin 1000 mg twice daily, Lantus 60 units in evening and lispro 50 units at breakfast, 35 units with lunch, 50 units with dinner. -Discussed switching metformin to [place] or Jardiance for renal and cardioprotection if covered by insurance. -Continue statin -Pt set up eye exam. -     POCT glycosylated hemoglobin (Hb A1C)  Bilateral lower extremity edema -Elevate LEs, continue compression and other supportive care -Lasix 40 mg daily.  May need adjustments.  History of CVA (cerebrovascular accident)  Hemiparesis affecting left side as late effect of cerebrovascular accident (HCC) -  Baclofen; Take 0.5 tablets (5 mg total) by mouth 3 (three) times daily.  Dispense: 90 each; Refill: 4  Spasticity as late effect of cerebrovascular accident (CVA) -     Baclofen; Take 0.5 tablets (5 mg total) by mouth 3 (three) times daily.  Dispense: 90 each; Refill: 4  Discussed taking Allegra daily for allergic rhinitis.  Continue wrapping LEs for lymphedema.  Discussed elevating scrotum given continued lymphedema.  Advised Lasix may no longer be as effective.  Awaiting upcoming labs with GI to assess renal function.  Discussed cirrhosis and ascites.  Will likely need diagnostic paracentesis.  Abdominal distention likely also causing increased cough.  Return if symptoms worsen or fail to improve.   Deeann Saint, MD

## 2023-12-08 NOTE — Telephone Encounter (Signed)
 Patient has a my chart video visit 3/10

## 2023-12-08 NOTE — Telephone Encounter (Signed)
 Ongoing left arm pain after stroke 2 years ago. Pain is more frequent and intense for the past 6 months, arm will "jerk up". Really bothering him today. Flairs 2-3 times per day for 2-3 hours at a time. Tylenol ineffective. Would like something stronger than Tylenol. Reports he has been having a flair today for 2 hours and having spasms with it. Virtual visit with Dr. Salomon Fick scheduled today.   Copied from CRM [id number]. Topic: Clinical - Red Word Triage >> Dec 08, 2023 10:43 AM [name] wrote: Red Word that prompted transfer to Nurse Triage: Severe pain in left arm, started about 2 hours ago Reason for Disposition  [1] SEVERE pain AND [2] not improved 2 hours after pain medicine  Answer Assessment - Initial Assessment Questions 1. ONSET: "When did the pain start?"     2 years ago after stroke.  2. LOCATION: "Where is the pain located?"     Left arm 3. PAIN: "How bad is the pain?" (Scale 1-10; or mild, moderate, severe)   - MILD (1-3): Doesn't interfere with normal activities.   - MODERATE (4-7): Interferes with normal activities (e.g., work or school) or awakens from sleep.   - SEVERE (8-10): Excruciating pain, unable to do any normal activities, unable to hold a cup of water.     Severe  4. CAUSE: "What do you think is causing the arm pain?"     Post stroke 5. OTHER SYMPTOMS: "Do you have any other symptoms?" (e.g., neck pain, swelling, rash, fever, numbness, weakness)     "Jerking", spasms  Protocols used: Arm Pain-A-AH

## 2023-12-08 NOTE — Progress Notes (Signed)
 Virtual Visit via Video Note  I connected with Jimmy Carr on 12/08/23 at  4:30 PM EDT by a video enabled telemedicine application and verified that I am speaking with the correct person using two identifiers.  Location patient: home Location provider:work or home office Persons participating in the virtual visit: patient, provider, pt's wife.  I discussed the limitations of evaluation and management by telemedicine and the availability of in person appointments. The patient expressed understanding and agreed to proceed. Chief Complaint  Patient presents with   Arm Pain    Left arm 6 mth, rate of pain 10 out of 10, 2 x day,      HPI: Pt is an 83 yo male with pmh sig for h/o CVA with residual left hemiparesis, spasticity, cirrhosis of liver, ascites, A-fib, DM2, glaucoma, HLD, peripheral neuropathy who presents for ongoing concern. Pt endorses pain in L arm.  Arm "jerks up"/spasms from L elbow, L bicep, up to shoulder.  Has become more intense in the last few months.  Last 3-4 hrs a few times per day.  Tylenol and voltaren don't help.  Pain is a 10/10.  Pt inquires about morphine/how it would works.  Ropinerol and gabapentin not helping.  Taking ropinirole and baclofen at 7am, 2pm, then 11 pm.  Per patient's wife she was afraid to give him gabapentin at the same time as other medications so he has not been taking it.  Patient inquires about fentanyl.  PM&R d/c'd robaxin.  Patient inquires about hospice/palliative care.  Has EGD scheduled Wednesday with GI.  Inquires if paracentesis will be done.  ROS: See pertinent positives and negatives per HPI.  Past Medical History:  Diagnosis Date   AF (atrial fibrillation) (HCC)    2D ECHO, 03/01/1987 - normal; NUCLEAR STRESS TEST, 04/10/2006 - EF 65%, no ischemia   Bruit    CAROTID DOPPLER, 09/20/2008 - normal, no evidence of diameter reduction, significant tortuousity or any other vascular abnormality   Cerebrovascular accident [facility]) 2003    history of small vessal lacunar stroke 2003   Diabetes mellitus type II    Glaucoma    Hyperlipidemia    Hypertension    Other specified cardiac dysrhythmias(427.89)    PSVT   Peripheral neuropathy     Past Surgical History:  Procedure Laterality Date   ANAL FISSURE REPAIR     CARDIAC CATHETERIZATION  02/02/2001   Normal LV systolic function, questionable mitral valve prolapse without mitral regurgitation, no evidence of CAD   CARDIAC CATHETERIZATION  09/13/2008   Minimal CAD, normal LV systolic function, no evidence of renal artery stenosis, distal aortic disease, or iliac disease    Family History  Problem Relation Age of Onset   Diabetes Neg Hx    Colon cancer Neg Hx      Current Outpatient Medications:    acetaminophen (TYLENOL) 500 MG tablet, Take 500 mg by mouth every 4 (four) hours as needed., Disp: , Rfl:    baclofen (LIORESAL) 10 MG tablet, Take 0.5 tablets (5 mg total) by mouth 3 (three) times daily., Disp: 90 each, Rfl: 4   Blood Glucose Monitoring Suppl (ACCU-CHEK AVIVA PLUS) w/Device KIT, USED TO CHECK BLOOD SUGAR 3 TIMES DAILY OR PRN., Disp: 1 kit, Rfl: 0   carvedilol (COREG) 6.25 MG tablet, Take 1 tablet (6.25 mg total) by mouth 2 (two) times daily with a meal., Disp: 180 tablet, Rfl: 3   diclofenac Sodium (VOLTAREN) 1 % GEL, Apply 2 g topically as needed., Disp: , Rfl:  furosemide (LASIX) 40 MG tablet, TAKE 1 TABLET(40 MG) BY MOUTH DAILY, Disp: 90 tablet, Rfl: 0   gabapentin (NEURONTIN) 100 MG capsule, Take 1-2 capsules (100-200 mg total) by mouth 2 (two) times daily., Disp: 120 capsule, Rfl: 1   Gauze Bandages (BULKEE II GAUZE 4.5"X4.1YD) MISC, 1 Box by Does not apply route 3 (three) times a week., Disp: 1 each, Rfl: 6   Gauze Pads & Dressings (CURITY ABDOMINAL) 5"X9" PADS, Apply 1 Box topically 3 (three) times a week., Disp: 432 each, Rfl: 0   glucose blood (PRODIGY NO CODING BLOOD GLUC) test strip, Use as instructed to check blood sugars three times daily  E11.65, Z79.4, Disp: 300 each, Rfl: 3   insulin lispro (HUMALOG) 100 UNIT/ML KwikPen, INJECT 50 UNITS UNDER THE SKIN AT BREAKFAST, 35 UNITS WITH LUNCH, AND 50 UNITS WITH SUPPER, Disp: 123 mL, Rfl: 5   Insulin Pen Needle (B-D ULTRAFINE III SHORT PEN) 31G X 8 MM MISC, USE FOUR TIMES DAILY BEFORE MEALS AND AT BEDTIME, Disp: 200 each, Rfl: 5   LANTUS SOLOSTAR 100 UNIT/ML Solostar Pen, ADMINISTER 60 UNITS UNDER THE SKIN AT 10 PM, Disp: 15 mL, Rfl: 2   latanoprost (XALATAN) 0.005 % ophthalmic solution, 1 drop daily. , Disp: , Rfl:    lisinopril (ZESTRIL) 40 MG tablet, TAKE 1 TABLET(40 MG) BY MOUTH DAILY, Disp: 90 tablet, Rfl: 1   lovastatin (MEVACOR) 40 MG tablet, TAKE 1 TABLET(40 MG) BY MOUTH AT BEDTIME, Disp: 90 tablet, Rfl: 1   metFORMIN (GLUCOPHAGE) 1000 MG tablet, TAKE 1 TABLET(1000 MG) BY MOUTH TWICE DAILY WITH A MEAL, Disp: 180 tablet, Rfl: 1   nystatin cream (MYCOSTATIN), APPLY TOPICALLY TO THE AFFECTED AREA TWICE DAILY, Disp: 30 g, Rfl: 1   nystatin powder, APPLY TOPICALLY THREE TIMES DAILY, Disp: 30 g, Rfl: 1   pantoprazole (PROTONIX) 40 MG tablet, Take 40 mg by mouth daily., Disp: , Rfl:    potassium chloride SA (KLOR-CON M) 20 MEQ tablet, TAKE 1 TABLET BY MOUTH DAILY WITH HYDROCHLOROTHIAZIDE TO PREVENT LOW POTASSIUM, Disp: 90 tablet, Rfl: 3   Prodigy Twist Top Lancets 28G MISC, USE AS DIRECTED TO TEST THREE TIMES DAILY, Disp: 300 each, Rfl: 3   rOPINIRole (REQUIP) 1 MG tablet, Take 1 tablet (1 mg total) by mouth 3 (three) times daily., Disp: 270 tablet, Rfl: 1   timolol (TIMOPTIC-XR) 0.5 % ophthalmic gel-forming, Place 1 drop into both eyes daily. , Disp: , Rfl:    XARELTO 20 MG TABS tablet, TAKE 1 TABLET BY MOUTH EVERY EVENING, Disp: 90 tablet, Rfl: 3  Current Facility-Administered Medications:    0.9 %  sodium chloride infusion, 500 mL, Intravenous, Continuous, Russella Dar, Venita Lick, MD  EXAM:  VITALS per patient if applicable: RR between 12-20 bpm  GENERAL: alert, oriented, appears well  and in no acute distress  HEENT: atraumatic, conjunctiva clear, no obvious abnormalities on inspection of external nose and ears  NECK: normal movements of the head and neck  LUNGS: on inspection no signs of respiratory distress, breathing rate appears normal, no obvious gross SOB, gasping or wheezing  CV: no obvious cyanosis  MS: moves all visible extremities without noticeable abnormality  PSYCH/NEURO: pleasant and cooperative, no obvious depression or anxiety, speech and thought processing grossly intact  ASSESSMENT AND PLAN:  Discussed the following assessment and plan:  Spasticity as late effect of cerebrovascular accident (CVA)  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  Likely from spasticity due to CVA.  Due to liver cirrhosis pt advised to stop taking tylenol.  Continue Ropinirol and Baclofen at 7 am, 2 pm, and 11 pm.  Try taking Gabapentin 100 mg BID at 10 am and 5 pm.  Discussed concern for medications causing increased drowsiness.  Also discussed concerns with starting opioids at this time.  For continued symptoms in the next few days despite gabapentin use consider discontinuing gabapentin and starting low-dose tramadol.  Patient encouraged to keep EGD scheduled for Wednesday.  Anticipate further recommendations based on results.   I discussed the assessment and treatment plan with the patient. The patient was provided an opportunity to ask questions and all were answered. The patient agreed with the plan and demonstrated an understanding of the instructions.   The patient was advised to call back or seek an in-person evaluation if the symptoms worsen or if the condition fails to improve as anticipated.   Deeann Saint, MD

## 2023-12-08 NOTE — Progress Notes (Signed)
 Patient was unable to self-report due to a lack of equipment at home via telehealth

## 2023-12-10 DIAGNOSIS — I700 Atherosclerosis of aorta: Secondary | ICD-10-CM | POA: Diagnosis not present

## 2023-12-10 DIAGNOSIS — K3189 Other diseases of stomach and duodenum: Secondary | ICD-10-CM | POA: Diagnosis not present

## 2023-12-10 DIAGNOSIS — R188 Other ascites: Secondary | ICD-10-CM | POA: Diagnosis not present

## 2023-12-10 DIAGNOSIS — E669 Obesity, unspecified: Secondary | ICD-10-CM | POA: Diagnosis not present

## 2023-12-10 DIAGNOSIS — J90 Pleural effusion, not elsewhere classified: Secondary | ICD-10-CM | POA: Diagnosis not present

## 2023-12-10 DIAGNOSIS — I8500 Esophageal varices without bleeding: Secondary | ICD-10-CM | POA: Diagnosis not present

## 2023-12-10 DIAGNOSIS — Z7901 Long term (current) use of anticoagulants: Secondary | ICD-10-CM | POA: Diagnosis not present

## 2023-12-10 DIAGNOSIS — Z7984 Long term (current) use of oral hypoglycemic drugs: Secondary | ICD-10-CM | POA: Diagnosis not present

## 2023-12-10 DIAGNOSIS — I2584 Coronary atherosclerosis due to calcified coronary lesion: Secondary | ICD-10-CM | POA: Diagnosis not present

## 2023-12-10 DIAGNOSIS — E119 Type 2 diabetes mellitus without complications: Secondary | ICD-10-CM | POA: Diagnosis not present

## 2023-12-10 DIAGNOSIS — I8501 Esophageal varices with bleeding: Secondary | ICD-10-CM | POA: Diagnosis not present

## 2023-12-10 DIAGNOSIS — K5730 Diverticulosis of large intestine without perforation or abscess without bleeding: Secondary | ICD-10-CM | POA: Diagnosis not present

## 2023-12-10 DIAGNOSIS — I10 Essential (primary) hypertension: Secondary | ICD-10-CM | POA: Diagnosis not present

## 2023-12-10 DIAGNOSIS — I2510 Atherosclerotic heart disease of native coronary artery without angina pectoris: Secondary | ICD-10-CM | POA: Diagnosis not present

## 2023-12-10 DIAGNOSIS — Z6838 Body mass index (BMI) 38.0-38.9, adult: Secondary | ICD-10-CM | POA: Diagnosis not present

## 2023-12-10 DIAGNOSIS — I480 Paroxysmal atrial fibrillation: Secondary | ICD-10-CM | POA: Diagnosis not present

## 2023-12-10 DIAGNOSIS — K649 Unspecified hemorrhoids: Secondary | ICD-10-CM | POA: Diagnosis not present

## 2023-12-10 DIAGNOSIS — I8510 Secondary esophageal varices without bleeding: Secondary | ICD-10-CM | POA: Diagnosis not present

## 2023-12-10 DIAGNOSIS — K7460 Unspecified cirrhosis of liver: Secondary | ICD-10-CM | POA: Diagnosis not present

## 2023-12-10 DIAGNOSIS — K766 Portal hypertension: Secondary | ICD-10-CM | POA: Diagnosis not present

## 2023-12-11 DIAGNOSIS — E113299 Type 2 diabetes mellitus with mild nonproliferative diabetic retinopathy without macular edema, unspecified eye: Secondary | ICD-10-CM | POA: Diagnosis not present

## 2023-12-11 DIAGNOSIS — E1142 Type 2 diabetes mellitus with diabetic polyneuropathy: Secondary | ICD-10-CM | POA: Diagnosis not present

## 2023-12-11 DIAGNOSIS — I480 Paroxysmal atrial fibrillation: Secondary | ICD-10-CM | POA: Diagnosis not present

## 2023-12-11 DIAGNOSIS — I69354 Hemiplegia and hemiparesis following cerebral infarction affecting left non-dominant side: Secondary | ICD-10-CM | POA: Diagnosis not present

## 2023-12-11 DIAGNOSIS — I890 Lymphedema, not elsewhere classified: Secondary | ICD-10-CM | POA: Diagnosis not present

## 2023-12-11 DIAGNOSIS — K769 Liver disease, unspecified: Secondary | ICD-10-CM | POA: Diagnosis not present

## 2023-12-19 ENCOUNTER — Encounter: Payer: Medicare Other | Admitting: Physical Medicine & Rehabilitation

## 2023-12-19 ENCOUNTER — Telehealth (INDEPENDENT_AMBULATORY_CARE_PROVIDER_SITE_OTHER): Admitting: Family Medicine

## 2023-12-19 ENCOUNTER — Encounter: Payer: Self-pay | Admitting: Family Medicine

## 2023-12-19 DIAGNOSIS — S3131XA Laceration without foreign body of scrotum and testes, initial encounter: Secondary | ICD-10-CM | POA: Diagnosis not present

## 2023-12-19 DIAGNOSIS — E66813 Obesity, class 3: Secondary | ICD-10-CM | POA: Diagnosis not present

## 2023-12-19 DIAGNOSIS — N1831 Chronic kidney disease, stage 3a: Secondary | ICD-10-CM | POA: Diagnosis not present

## 2023-12-19 DIAGNOSIS — Z7901 Long term (current) use of anticoagulants: Secondary | ICD-10-CM | POA: Diagnosis not present

## 2023-12-19 DIAGNOSIS — E8770 Fluid overload, unspecified: Secondary | ICD-10-CM | POA: Diagnosis not present

## 2023-12-19 DIAGNOSIS — Z7401 Bed confinement status: Secondary | ICD-10-CM | POA: Diagnosis not present

## 2023-12-19 DIAGNOSIS — N5089 Other specified disorders of the male genital organs: Secondary | ICD-10-CM | POA: Diagnosis not present

## 2023-12-19 DIAGNOSIS — E871 Hypo-osmolality and hyponatremia: Secondary | ICD-10-CM | POA: Diagnosis not present

## 2023-12-19 DIAGNOSIS — H409 Unspecified glaucoma: Secondary | ICD-10-CM | POA: Diagnosis present

## 2023-12-19 DIAGNOSIS — X58XXXA Exposure to other specified factors, initial encounter: Secondary | ICD-10-CM | POA: Diagnosis not present

## 2023-12-19 DIAGNOSIS — K7581 Nonalcoholic steatohepatitis (NASH): Secondary | ICD-10-CM | POA: Diagnosis present

## 2023-12-19 DIAGNOSIS — N139 Obstructive and reflux uropathy, unspecified: Secondary | ICD-10-CM | POA: Diagnosis not present

## 2023-12-19 DIAGNOSIS — M6281 Muscle weakness (generalized): Secondary | ICD-10-CM | POA: Diagnosis not present

## 2023-12-19 DIAGNOSIS — I890 Lymphedema, not elsewhere classified: Secondary | ICD-10-CM

## 2023-12-19 DIAGNOSIS — R188 Other ascites: Secondary | ICD-10-CM

## 2023-12-19 DIAGNOSIS — R609 Edema, unspecified: Secondary | ICD-10-CM | POA: Diagnosis not present

## 2023-12-19 DIAGNOSIS — R161 Splenomegaly, not elsewhere classified: Secondary | ICD-10-CM | POA: Diagnosis not present

## 2023-12-19 DIAGNOSIS — Z8669 Personal history of other diseases of the nervous system and sense organs: Secondary | ICD-10-CM | POA: Diagnosis not present

## 2023-12-19 DIAGNOSIS — R601 Generalized edema: Secondary | ICD-10-CM | POA: Diagnosis not present

## 2023-12-19 DIAGNOSIS — S3131XD Laceration without foreign body of scrotum and testes, subsequent encounter: Secondary | ICD-10-CM | POA: Diagnosis not present

## 2023-12-19 DIAGNOSIS — K7031 Alcoholic cirrhosis of liver with ascites: Secondary | ICD-10-CM | POA: Diagnosis not present

## 2023-12-19 DIAGNOSIS — G63 Polyneuropathy in diseases classified elsewhere: Secondary | ICD-10-CM | POA: Diagnosis not present

## 2023-12-19 DIAGNOSIS — E118 Type 2 diabetes mellitus with unspecified complications: Secondary | ICD-10-CM | POA: Diagnosis not present

## 2023-12-19 DIAGNOSIS — L304 Erythema intertrigo: Secondary | ICD-10-CM | POA: Diagnosis not present

## 2023-12-19 DIAGNOSIS — Z7984 Long term (current) use of oral hypoglycemic drugs: Secondary | ICD-10-CM | POA: Diagnosis not present

## 2023-12-19 DIAGNOSIS — Z515 Encounter for palliative care: Secondary | ICD-10-CM | POA: Diagnosis not present

## 2023-12-19 DIAGNOSIS — R262 Difficulty in walking, not elsewhere classified: Secondary | ICD-10-CM | POA: Diagnosis not present

## 2023-12-19 DIAGNOSIS — R8569 Abnormal cytological findings in specimens from other digestive organs and abdominal cavity: Secondary | ICD-10-CM | POA: Diagnosis not present

## 2023-12-19 DIAGNOSIS — N401 Enlarged prostate with lower urinary tract symptoms: Secondary | ICD-10-CM | POA: Diagnosis not present

## 2023-12-19 DIAGNOSIS — H4010X0 Unspecified open-angle glaucoma, stage unspecified: Secondary | ICD-10-CM | POA: Diagnosis not present

## 2023-12-19 DIAGNOSIS — R718 Other abnormality of red blood cells: Secondary | ICD-10-CM | POA: Diagnosis not present

## 2023-12-19 DIAGNOSIS — Z6836 Body mass index (BMI) 36.0-36.9, adult: Secondary | ICD-10-CM | POA: Diagnosis not present

## 2023-12-19 DIAGNOSIS — I272 Pulmonary hypertension, unspecified: Secondary | ICD-10-CM | POA: Diagnosis not present

## 2023-12-19 DIAGNOSIS — K7460 Unspecified cirrhosis of liver: Secondary | ICD-10-CM | POA: Diagnosis present

## 2023-12-19 DIAGNOSIS — Z79899 Other long term (current) drug therapy: Secondary | ICD-10-CM | POA: Diagnosis not present

## 2023-12-19 DIAGNOSIS — R5381 Other malaise: Secondary | ICD-10-CM | POA: Diagnosis not present

## 2023-12-19 DIAGNOSIS — E1100 Type 2 diabetes mellitus with hyperosmolarity without nonketotic hyperglycemic-hyperosmolar coma (NKHHC): Secondary | ICD-10-CM | POA: Diagnosis not present

## 2023-12-19 DIAGNOSIS — E785 Hyperlipidemia, unspecified: Secondary | ICD-10-CM | POA: Diagnosis not present

## 2023-12-19 DIAGNOSIS — R1312 Dysphagia, oropharyngeal phase: Secondary | ICD-10-CM | POA: Diagnosis not present

## 2023-12-19 DIAGNOSIS — R531 Weakness: Secondary | ICD-10-CM | POA: Diagnosis not present

## 2023-12-19 DIAGNOSIS — I10 Essential (primary) hypertension: Secondary | ICD-10-CM | POA: Diagnosis not present

## 2023-12-19 DIAGNOSIS — I480 Paroxysmal atrial fibrillation: Secondary | ICD-10-CM | POA: Diagnosis present

## 2023-12-19 DIAGNOSIS — Z66 Do not resuscitate: Secondary | ICD-10-CM | POA: Diagnosis not present

## 2023-12-19 DIAGNOSIS — N5082 Scrotal pain: Secondary | ICD-10-CM | POA: Diagnosis not present

## 2023-12-19 DIAGNOSIS — E1122 Type 2 diabetes mellitus with diabetic chronic kidney disease: Secondary | ICD-10-CM | POA: Diagnosis present

## 2023-12-19 DIAGNOSIS — I129 Hypertensive chronic kidney disease with stage 1 through stage 4 chronic kidney disease, or unspecified chronic kidney disease: Secondary | ICD-10-CM | POA: Diagnosis not present

## 2023-12-19 DIAGNOSIS — Z794 Long term (current) use of insulin: Secondary | ICD-10-CM | POA: Diagnosis not present

## 2023-12-19 DIAGNOSIS — Z83511 Family history of glaucoma: Secondary | ICD-10-CM | POA: Diagnosis not present

## 2023-12-19 DIAGNOSIS — K219 Gastro-esophageal reflux disease without esophagitis: Secondary | ICD-10-CM | POA: Diagnosis not present

## 2023-12-19 DIAGNOSIS — G894 Chronic pain syndrome: Secondary | ICD-10-CM | POA: Diagnosis not present

## 2023-12-19 DIAGNOSIS — E119 Type 2 diabetes mellitus without complications: Secondary | ICD-10-CM | POA: Diagnosis not present

## 2023-12-19 DIAGNOSIS — K766 Portal hypertension: Secondary | ICD-10-CM | POA: Diagnosis not present

## 2023-12-19 DIAGNOSIS — G2581 Restless legs syndrome: Secondary | ICD-10-CM | POA: Diagnosis not present

## 2023-12-19 DIAGNOSIS — G9340 Encephalopathy, unspecified: Secondary | ICD-10-CM | POA: Diagnosis not present

## 2023-12-19 DIAGNOSIS — I69354 Hemiplegia and hemiparesis following cerebral infarction affecting left non-dominant side: Secondary | ICD-10-CM | POA: Diagnosis not present

## 2023-12-19 DIAGNOSIS — N183 Chronic kidney disease, stage 3 unspecified: Secondary | ICD-10-CM | POA: Diagnosis not present

## 2023-12-19 DIAGNOSIS — D631 Anemia in chronic kidney disease: Secondary | ICD-10-CM | POA: Diagnosis present

## 2023-12-19 MED ORDER — NYSTATIN 100000 UNIT/GM EX POWD: CUTANEOUS | 0 refills | Status: AC

## 2023-12-19 MED ORDER — BUMETANIDE 0.5 MG PO TABS: 0.5000 mg | ORAL_TABLET | Freq: Every day | ORAL | 0 refills | Status: AC

## 2023-12-19 NOTE — Progress Notes (Signed)
 Virtual Visit via Video Note  I connected with Jimmy Carr on 12/19/23 at  3:30 PM EDT by a video enabled telemedicine application and verified that I am speaking with the correct person using two identifiers.  Location patient: home Location provider:work or home office Persons participating in the virtual visit: patient, provider, patient's wife  I discussed the limitations of evaluation and management by telemedicine and the availability of in person appointments. The patient expressed understanding and agreed to proceed. Chief Complaint  Patient presents with   Testicle Pain    Fore skin is swollen, 2x in size, drainage,      HPI: Pt is 83 yo male seen for ongoing concern.  Pt with increased penile and scrotal edema.  Patient denies pain or difficulty urinating.  Unable to retract foreskin.  Patient's wife endorses red rash in left groin.  Was unsure which cream to use in area.  LE edema improving when legs since elevating legs in recliner. also with increased abdominal ascites.  Had lab appointment to check renal function.  Diagnostic Scheduled at the end of the month.   ROS: See pertinent positives and negatives per HPI.  Past Medical History:  Diagnosis Date   AF (atrial fibrillation) (HCC)    2D ECHO, 03/01/1987 - normal; NUCLEAR STRESS TEST, 04/10/2006 - EF 65%, no ischemia   Bruit    CAROTID DOPPLER, 09/20/2008 - normal, no evidence of diameter reduction, significant tortuousity or any other vascular abnormality   Cerebrovascular accident [facility]) 2003   history of small vessal lacunar stroke 2003   Diabetes mellitus type II    Glaucoma    Hyperlipidemia    Hypertension    Other specified cardiac dysrhythmias(427.89)    PSVT   Peripheral neuropathy     Past Surgical History:  Procedure Laterality Date   ANAL FISSURE REPAIR     CARDIAC CATHETERIZATION  02/02/2001   Normal LV systolic function, questionable mitral valve prolapse without mitral regurgitation, no evidence  of CAD   CARDIAC CATHETERIZATION  09/13/2008   Minimal CAD, normal LV systolic function, no evidence of renal artery stenosis, distal aortic disease, or iliac disease    Family History  Problem Relation Age of Onset   Diabetes Neg Hx    Colon cancer Neg Hx    Current Outpatient Medications:    acetaminophen (TYLENOL) 500 MG tablet, Take 500 mg by mouth every 4 (four) hours as needed., Disp: , Rfl:    baclofen (LIORESAL) 10 MG tablet, Take 0.5 tablets (5 mg total) by mouth 3 (three) times daily., Disp: 90 each, Rfl: 4   Blood Glucose Monitoring Suppl (ACCU-CHEK AVIVA PLUS) w/Device KIT, USED TO CHECK BLOOD SUGAR 3 TIMES DAILY OR PRN., Disp: 1 kit, Rfl: 0   bumetanide (BUMEX) 0.5 MG tablet, Take 1 tablet (0.5 mg total) by mouth daily., Disp: 3 tablet, Rfl: 0   carvedilol (COREG) 6.25 MG tablet, Take 1 tablet (6.25 mg total) by mouth 2 (two) times daily with a meal., Disp: 180 tablet, Rfl: 3   diclofenac Sodium (VOLTAREN) 1 % GEL, Apply 2 g topically as needed., Disp: , Rfl:    furosemide (LASIX) 40 MG tablet, TAKE 1 TABLET(40 MG) BY MOUTH DAILY, Disp: 90 tablet, Rfl: 0   gabapentin (NEURONTIN) 100 MG capsule, Take 1-2 capsules (100-200 mg total) by mouth 2 (two) times daily., Disp: 120 capsule, Rfl: 1   Gauze Bandages (BULKEE II GAUZE 4.5"X4.1YD) MISC, 1 Box by Does not apply route 3 (three) times a week.,  Disp: 1 each, Rfl: 6   Gauze Pads & Dressings (CURITY ABDOMINAL) 5"X9" PADS, Apply 1 Box topically 3 (three) times a week., Disp: 432 each, Rfl: 0   glucose blood (PRODIGY NO CODING BLOOD GLUC) test strip, Use as instructed to check blood sugars three times daily E11.65, Z79.4, Disp: 300 each, Rfl: 3   insulin lispro (HUMALOG) 100 UNIT/ML KwikPen, INJECT 50 UNITS UNDER THE SKIN AT BREAKFAST, 35 UNITS WITH LUNCH, AND 50 UNITS WITH SUPPER, Disp: 123 mL, Rfl: 5   Insulin Pen Needle (B-D ULTRAFINE III SHORT PEN) 31G X 8 MM MISC, USE FOUR TIMES DAILY BEFORE MEALS AND AT BEDTIME, Disp: 200 each,  Rfl: 5   LANTUS SOLOSTAR 100 UNIT/ML Solostar Pen, ADMINISTER 60 UNITS UNDER THE SKIN AT 10 PM, Disp: 15 mL, Rfl: 2   latanoprost (XALATAN) 0.005 % ophthalmic solution, 1 drop daily. , Disp: , Rfl:    lisinopril (ZESTRIL) 40 MG tablet, TAKE 1 TABLET(40 MG) BY MOUTH DAILY, Disp: 90 tablet, Rfl: 1   lovastatin (MEVACOR) 40 MG tablet, TAKE 1 TABLET(40 MG) BY MOUTH AT BEDTIME, Disp: 90 tablet, Rfl: 1   metFORMIN (GLUCOPHAGE) 1000 MG tablet, TAKE 1 TABLET(1000 MG) BY MOUTH TWICE DAILY WITH A MEAL, Disp: 180 tablet, Rfl: 1   nystatin cream (MYCOSTATIN), APPLY TOPICALLY TO THE AFFECTED AREA TWICE DAILY, Disp: 30 g, Rfl: 1   pantoprazole (PROTONIX) 40 MG tablet, Take 40 mg by mouth daily., Disp: , Rfl:    potassium chloride SA (KLOR-CON M) 20 MEQ tablet, TAKE 1 TABLET BY MOUTH DAILY WITH HYDROCHLOROTHIAZIDE TO PREVENT LOW POTASSIUM, Disp: 90 tablet, Rfl: 3   Prodigy Twist Top Lancets 28G MISC, USE AS DIRECTED TO TEST THREE TIMES DAILY, Disp: 300 each, Rfl: 3   rOPINIRole (REQUIP) 1 MG tablet, Take 1 tablet (1 mg total) by mouth 3 (three) times daily., Disp: 270 tablet, Rfl: 1   timolol (TIMOPTIC-XR) 0.5 % ophthalmic gel-forming, Place 1 drop into both eyes daily. , Disp: , Rfl:    XARELTO 20 MG TABS tablet, TAKE 1 TABLET BY MOUTH EVERY EVENING, Disp: 90 tablet, Rfl: 3   nystatin powder, APPLY TOPICALLY THREE TIMES DAILY, Disp: 60 g, Rfl: 0  Current Facility-Administered Medications:    0.9 %  sodium chloride infusion, 500 mL, Intravenous, Continuous, Russella Dar, Venita Lick, MD  EXAMTheodoro Kos per patient if applicable:  RR between 12-20 bpm  GENERAL: alert, oriented, appears well and in no acute distress  HEENT: atraumatic, conjunctiva clear, no obvious abnormalities on inspection of external nose and ears  NECK: normal movements of the head and neck  LUNGS: on inspection no signs of respiratory distress, breathing rate appears normal, no obvious gross SOB, gasping or wheezing  CV: no obvious  cyanosis  MS: moves all visible extremities without noticeable abnormality  PSYCH/NEURO: pleasant and cooperative, no obvious depression or anxiety, speech and thought processing grossly intact  ASSESSMENT AND PLAN:  Discussed the following assessment and plan:  Lymphedema of penis - Plan: bumetanide (BUMEX) 0.5 MG tablet  Chronic acquired lymphedema - Plan: bumetanide (BUMEX) 0.5 MG tablet  Cirrhosis of liver with ascites, unspecified hepatic cirrhosis type (HCC) - Plan: bumetanide (BUMEX) 0.5 MG tablet  Intertrigo - Plan: nystatin powder   Patient with continued lymphedema of penis.  Seen by urology however no treatment provided.  Awaiting results of recent renal function.  His Lasix no longer working as well given short trial of Bumex 0.5 mg for the weekend.  Advised if symptoms become  worse to proceed to nearest ED.  Nystatin powder for groin.  Advised to keep area clean and dry.  Unfortunately.  She is in a precarious position as elevating LEs helps the edema in legs but worsen scrotal edema.  Also with increasing ascites due to cirrhosis.  Continue follow-up with [facility].  Again given strict precautions.   I discussed the assessment and treatment plan with the patient. The patient was provided an opportunity to ask questions and all were answered. The patient agreed with the plan and demonstrated an understanding of the instructions.   The patient was advised to call back or seek an in-person evaluation if the symptoms worsen or if the condition fails to improve as anticipated.   Deeann Saint, MD

## 2023-12-19 NOTE — Progress Notes (Signed)
 Patient was unable to self-report due to a lack of equipment at home via telehealth

## 2023-12-20 DIAGNOSIS — Z794 Long term (current) use of insulin: Secondary | ICD-10-CM | POA: Diagnosis not present

## 2023-12-20 DIAGNOSIS — R531 Weakness: Secondary | ICD-10-CM | POA: Diagnosis not present

## 2023-12-20 DIAGNOSIS — M6281 Muscle weakness (generalized): Secondary | ICD-10-CM | POA: Diagnosis not present

## 2023-12-20 DIAGNOSIS — E1100 Type 2 diabetes mellitus with hyperosmolarity without nonketotic hyperglycemic-hyperosmolar coma (NKHHC): Secondary | ICD-10-CM | POA: Diagnosis not present

## 2023-12-20 DIAGNOSIS — Z515 Encounter for palliative care: Secondary | ICD-10-CM | POA: Diagnosis not present

## 2023-12-20 DIAGNOSIS — I129 Hypertensive chronic kidney disease with stage 1 through stage 4 chronic kidney disease, or unspecified chronic kidney disease: Secondary | ICD-10-CM | POA: Diagnosis present

## 2023-12-20 DIAGNOSIS — G63 Polyneuropathy in diseases classified elsewhere: Secondary | ICD-10-CM | POA: Diagnosis not present

## 2023-12-20 DIAGNOSIS — R1312 Dysphagia, oropharyngeal phase: Secondary | ICD-10-CM | POA: Diagnosis not present

## 2023-12-20 DIAGNOSIS — R601 Generalized edema: Secondary | ICD-10-CM | POA: Diagnosis not present

## 2023-12-20 DIAGNOSIS — G9340 Encephalopathy, unspecified: Secondary | ICD-10-CM | POA: Diagnosis not present

## 2023-12-20 DIAGNOSIS — E119 Type 2 diabetes mellitus without complications: Secondary | ICD-10-CM | POA: Diagnosis not present

## 2023-12-20 DIAGNOSIS — E118 Type 2 diabetes mellitus with unspecified complications: Secondary | ICD-10-CM | POA: Diagnosis not present

## 2023-12-20 DIAGNOSIS — H409 Unspecified glaucoma: Secondary | ICD-10-CM | POA: Diagnosis present

## 2023-12-20 DIAGNOSIS — I10 Essential (primary) hypertension: Secondary | ICD-10-CM | POA: Diagnosis not present

## 2023-12-20 DIAGNOSIS — H4010X0 Unspecified open-angle glaucoma, stage unspecified: Secondary | ICD-10-CM | POA: Diagnosis not present

## 2023-12-20 DIAGNOSIS — I69354 Hemiplegia and hemiparesis following cerebral infarction affecting left non-dominant side: Secondary | ICD-10-CM | POA: Diagnosis not present

## 2023-12-20 DIAGNOSIS — R262 Difficulty in walking, not elsewhere classified: Secondary | ICD-10-CM | POA: Diagnosis not present

## 2023-12-20 DIAGNOSIS — Z7984 Long term (current) use of oral hypoglycemic drugs: Secondary | ICD-10-CM | POA: Diagnosis not present

## 2023-12-20 DIAGNOSIS — E8770 Fluid overload, unspecified: Secondary | ICD-10-CM | POA: Diagnosis present

## 2023-12-20 DIAGNOSIS — G894 Chronic pain syndrome: Secondary | ICD-10-CM | POA: Diagnosis not present

## 2023-12-20 DIAGNOSIS — G2581 Restless legs syndrome: Secondary | ICD-10-CM | POA: Diagnosis present

## 2023-12-20 DIAGNOSIS — Z83511 Family history of glaucoma: Secondary | ICD-10-CM | POA: Diagnosis not present

## 2023-12-20 DIAGNOSIS — N5082 Scrotal pain: Secondary | ICD-10-CM | POA: Diagnosis not present

## 2023-12-20 DIAGNOSIS — I480 Paroxysmal atrial fibrillation: Secondary | ICD-10-CM | POA: Diagnosis present

## 2023-12-20 DIAGNOSIS — Z7901 Long term (current) use of anticoagulants: Secondary | ICD-10-CM | POA: Diagnosis not present

## 2023-12-20 DIAGNOSIS — D631 Anemia in chronic kidney disease: Secondary | ICD-10-CM | POA: Diagnosis present

## 2023-12-20 DIAGNOSIS — R5381 Other malaise: Secondary | ICD-10-CM | POA: Diagnosis not present

## 2023-12-20 DIAGNOSIS — K7581 Nonalcoholic steatohepatitis (NASH): Secondary | ICD-10-CM | POA: Diagnosis present

## 2023-12-20 DIAGNOSIS — N5089 Other specified disorders of the male genital organs: Secondary | ICD-10-CM | POA: Diagnosis present

## 2023-12-20 DIAGNOSIS — N401 Enlarged prostate with lower urinary tract symptoms: Secondary | ICD-10-CM | POA: Diagnosis not present

## 2023-12-20 DIAGNOSIS — E785 Hyperlipidemia, unspecified: Secondary | ICD-10-CM | POA: Diagnosis present

## 2023-12-20 DIAGNOSIS — K7460 Unspecified cirrhosis of liver: Secondary | ICD-10-CM | POA: Diagnosis present

## 2023-12-20 DIAGNOSIS — R718 Other abnormality of red blood cells: Secondary | ICD-10-CM | POA: Diagnosis not present

## 2023-12-20 DIAGNOSIS — E66813 Obesity, class 3: Secondary | ICD-10-CM | POA: Diagnosis not present

## 2023-12-20 DIAGNOSIS — S3131XD Laceration without foreign body of scrotum and testes, subsequent encounter: Secondary | ICD-10-CM | POA: Diagnosis not present

## 2023-12-20 DIAGNOSIS — N183 Chronic kidney disease, stage 3 unspecified: Secondary | ICD-10-CM | POA: Diagnosis not present

## 2023-12-20 DIAGNOSIS — I272 Pulmonary hypertension, unspecified: Secondary | ICD-10-CM | POA: Diagnosis not present

## 2023-12-20 DIAGNOSIS — Z79899 Other long term (current) drug therapy: Secondary | ICD-10-CM | POA: Diagnosis not present

## 2023-12-20 DIAGNOSIS — N1831 Chronic kidney disease, stage 3a: Secondary | ICD-10-CM | POA: Diagnosis present

## 2023-12-20 DIAGNOSIS — R8569 Abnormal cytological findings in specimens from other digestive organs and abdominal cavity: Secondary | ICD-10-CM | POA: Diagnosis not present

## 2023-12-20 DIAGNOSIS — R188 Other ascites: Secondary | ICD-10-CM | POA: Diagnosis present

## 2023-12-20 DIAGNOSIS — K219 Gastro-esophageal reflux disease without esophagitis: Secondary | ICD-10-CM | POA: Diagnosis not present

## 2023-12-20 DIAGNOSIS — S3131XA Laceration without foreign body of scrotum and testes, initial encounter: Secondary | ICD-10-CM | POA: Diagnosis present

## 2023-12-20 DIAGNOSIS — Z66 Do not resuscitate: Secondary | ICD-10-CM | POA: Diagnosis not present

## 2023-12-20 DIAGNOSIS — E1122 Type 2 diabetes mellitus with diabetic chronic kidney disease: Secondary | ICD-10-CM | POA: Diagnosis present

## 2023-12-20 DIAGNOSIS — Z6836 Body mass index (BMI) 36.0-36.9, adult: Secondary | ICD-10-CM | POA: Diagnosis not present

## 2023-12-20 DIAGNOSIS — E871 Hypo-osmolality and hyponatremia: Secondary | ICD-10-CM | POA: Diagnosis present

## 2023-12-20 DIAGNOSIS — Z7401 Bed confinement status: Secondary | ICD-10-CM | POA: Diagnosis not present

## 2023-12-20 DIAGNOSIS — N139 Obstructive and reflux uropathy, unspecified: Secondary | ICD-10-CM | POA: Diagnosis not present

## 2023-12-28 DIAGNOSIS — Z83511 Family history of glaucoma: Secondary | ICD-10-CM | POA: Diagnosis not present

## 2023-12-28 DIAGNOSIS — E1122 Type 2 diabetes mellitus with diabetic chronic kidney disease: Secondary | ICD-10-CM | POA: Diagnosis not present

## 2023-12-28 DIAGNOSIS — K7581 Nonalcoholic steatohepatitis (NASH): Secondary | ICD-10-CM | POA: Diagnosis not present

## 2023-12-28 DIAGNOSIS — E66813 Obesity, class 3: Secondary | ICD-10-CM | POA: Diagnosis not present

## 2023-12-28 DIAGNOSIS — R5383 Other fatigue: Secondary | ICD-10-CM | POA: Diagnosis not present

## 2023-12-28 DIAGNOSIS — R062 Wheezing: Secondary | ICD-10-CM | POA: Diagnosis not present

## 2023-12-28 DIAGNOSIS — L89313 Pressure ulcer of right buttock, stage 3: Secondary | ICD-10-CM | POA: Diagnosis not present

## 2023-12-28 DIAGNOSIS — Z7401 Bed confinement status: Secondary | ICD-10-CM | POA: Diagnosis not present

## 2023-12-28 DIAGNOSIS — E785 Hyperlipidemia, unspecified: Secondary | ICD-10-CM | POA: Diagnosis not present

## 2023-12-28 DIAGNOSIS — N139 Obstructive and reflux uropathy, unspecified: Secondary | ICD-10-CM | POA: Diagnosis not present

## 2023-12-28 DIAGNOSIS — M6281 Muscle weakness (generalized): Secondary | ICD-10-CM | POA: Diagnosis not present

## 2023-12-28 DIAGNOSIS — S3131XD Laceration without foreign body of scrotum and testes, subsequent encounter: Secondary | ICD-10-CM | POA: Diagnosis not present

## 2023-12-28 DIAGNOSIS — I480 Paroxysmal atrial fibrillation: Secondary | ICD-10-CM | POA: Diagnosis not present

## 2023-12-28 DIAGNOSIS — E1100 Type 2 diabetes mellitus with hyperosmolarity without nonketotic hyperglycemic-hyperosmolar coma (NKHHC): Secondary | ICD-10-CM | POA: Diagnosis not present

## 2023-12-28 DIAGNOSIS — G2581 Restless legs syndrome: Secondary | ICD-10-CM | POA: Diagnosis not present

## 2023-12-28 DIAGNOSIS — I10 Essential (primary) hypertension: Secondary | ICD-10-CM | POA: Diagnosis not present

## 2023-12-28 DIAGNOSIS — N1831 Chronic kidney disease, stage 3a: Secondary | ICD-10-CM | POA: Diagnosis not present

## 2023-12-28 DIAGNOSIS — E119 Type 2 diabetes mellitus without complications: Secondary | ICD-10-CM | POA: Diagnosis not present

## 2023-12-28 DIAGNOSIS — L97821 Non-pressure chronic ulcer of other part of left lower leg limited to breakdown of skin: Secondary | ICD-10-CM | POA: Diagnosis not present

## 2023-12-28 DIAGNOSIS — K7460 Unspecified cirrhosis of liver: Secondary | ICD-10-CM | POA: Diagnosis not present

## 2023-12-28 DIAGNOSIS — E669 Obesity, unspecified: Secondary | ICD-10-CM | POA: Diagnosis not present

## 2023-12-28 DIAGNOSIS — I69398 Other sequelae of cerebral infarction: Secondary | ICD-10-CM | POA: Diagnosis not present

## 2023-12-28 DIAGNOSIS — K219 Gastro-esophageal reflux disease without esophagitis: Secondary | ICD-10-CM | POA: Diagnosis not present

## 2023-12-28 DIAGNOSIS — I129 Hypertensive chronic kidney disease with stage 1 through stage 4 chronic kidney disease, or unspecified chronic kidney disease: Secondary | ICD-10-CM | POA: Diagnosis not present

## 2023-12-28 DIAGNOSIS — R718 Other abnormality of red blood cells: Secondary | ICD-10-CM | POA: Diagnosis not present

## 2023-12-28 DIAGNOSIS — G63 Polyneuropathy in diseases classified elsewhere: Secondary | ICD-10-CM | POA: Diagnosis not present

## 2023-12-28 DIAGNOSIS — L89310 Pressure ulcer of right buttock, unstageable: Secondary | ICD-10-CM | POA: Diagnosis not present

## 2023-12-28 DIAGNOSIS — N183 Chronic kidney disease, stage 3 unspecified: Secondary | ICD-10-CM | POA: Diagnosis not present

## 2023-12-28 DIAGNOSIS — I272 Pulmonary hypertension, unspecified: Secondary | ICD-10-CM | POA: Diagnosis not present

## 2023-12-28 DIAGNOSIS — R188 Other ascites: Secondary | ICD-10-CM | POA: Diagnosis not present

## 2023-12-28 DIAGNOSIS — L89150 Pressure ulcer of sacral region, unstageable: Secondary | ICD-10-CM | POA: Diagnosis not present

## 2023-12-28 DIAGNOSIS — N401 Enlarged prostate with lower urinary tract symptoms: Secondary | ICD-10-CM | POA: Diagnosis not present

## 2023-12-28 DIAGNOSIS — H4010X0 Unspecified open-angle glaucoma, stage unspecified: Secondary | ICD-10-CM | POA: Diagnosis not present

## 2023-12-28 DIAGNOSIS — R0602 Shortness of breath: Secondary | ICD-10-CM | POA: Diagnosis not present

## 2023-12-28 DIAGNOSIS — S3131XA Laceration without foreign body of scrotum and testes, initial encounter: Secondary | ICD-10-CM | POA: Diagnosis not present

## 2023-12-28 DIAGNOSIS — L97811 Non-pressure chronic ulcer of other part of right lower leg limited to breakdown of skin: Secondary | ICD-10-CM | POA: Diagnosis not present

## 2023-12-28 DIAGNOSIS — L89153 Pressure ulcer of sacral region, stage 3: Secondary | ICD-10-CM | POA: Diagnosis not present

## 2023-12-28 DIAGNOSIS — R262 Difficulty in walking, not elsewhere classified: Secondary | ICD-10-CM | POA: Diagnosis not present

## 2023-12-28 DIAGNOSIS — E118 Type 2 diabetes mellitus with unspecified complications: Secondary | ICD-10-CM | POA: Diagnosis not present

## 2023-12-28 DIAGNOSIS — R531 Weakness: Secondary | ICD-10-CM | POA: Diagnosis not present

## 2023-12-28 DIAGNOSIS — R1312 Dysphagia, oropharyngeal phase: Secondary | ICD-10-CM | POA: Diagnosis not present

## 2023-12-29 DIAGNOSIS — S3131XD Laceration without foreign body of scrotum and testes, subsequent encounter: Secondary | ICD-10-CM | POA: Diagnosis not present

## 2023-12-29 DIAGNOSIS — E66813 Obesity, class 3: Secondary | ICD-10-CM | POA: Diagnosis not present

## 2023-12-29 DIAGNOSIS — G2581 Restless legs syndrome: Secondary | ICD-10-CM | POA: Diagnosis not present

## 2023-12-29 DIAGNOSIS — E119 Type 2 diabetes mellitus without complications: Secondary | ICD-10-CM | POA: Diagnosis not present

## 2023-12-29 DIAGNOSIS — L89150 Pressure ulcer of sacral region, unstageable: Secondary | ICD-10-CM | POA: Diagnosis not present

## 2023-12-29 DIAGNOSIS — N1831 Chronic kidney disease, stage 3a: Secondary | ICD-10-CM | POA: Diagnosis not present

## 2023-12-29 DIAGNOSIS — K7460 Unspecified cirrhosis of liver: Secondary | ICD-10-CM | POA: Diagnosis not present

## 2023-12-29 DIAGNOSIS — N401 Enlarged prostate with lower urinary tract symptoms: Secondary | ICD-10-CM | POA: Diagnosis not present

## 2023-12-29 DIAGNOSIS — E118 Type 2 diabetes mellitus with unspecified complications: Secondary | ICD-10-CM | POA: Diagnosis not present

## 2023-12-29 DIAGNOSIS — N139 Obstructive and reflux uropathy, unspecified: Secondary | ICD-10-CM | POA: Diagnosis not present

## 2023-12-29 DIAGNOSIS — L97821 Non-pressure chronic ulcer of other part of left lower leg limited to breakdown of skin: Secondary | ICD-10-CM | POA: Diagnosis not present

## 2023-12-29 DIAGNOSIS — E785 Hyperlipidemia, unspecified: Secondary | ICD-10-CM | POA: Diagnosis not present

## 2023-12-29 DIAGNOSIS — K7581 Nonalcoholic steatohepatitis (NASH): Secondary | ICD-10-CM | POA: Diagnosis not present

## 2023-12-29 DIAGNOSIS — L97811 Non-pressure chronic ulcer of other part of right lower leg limited to breakdown of skin: Secondary | ICD-10-CM | POA: Diagnosis not present

## 2023-12-29 DIAGNOSIS — I480 Paroxysmal atrial fibrillation: Secondary | ICD-10-CM | POA: Diagnosis not present

## 2023-12-29 DIAGNOSIS — L89310 Pressure ulcer of right buttock, unstageable: Secondary | ICD-10-CM | POA: Diagnosis not present

## 2023-12-30 DIAGNOSIS — E785 Hyperlipidemia, unspecified: Secondary | ICD-10-CM | POA: Diagnosis not present

## 2023-12-30 DIAGNOSIS — N1831 Chronic kidney disease, stage 3a: Secondary | ICD-10-CM | POA: Diagnosis not present

## 2023-12-30 DIAGNOSIS — K7460 Unspecified cirrhosis of liver: Secondary | ICD-10-CM | POA: Diagnosis not present

## 2023-12-30 DIAGNOSIS — S3131XD Laceration without foreign body of scrotum and testes, subsequent encounter: Secondary | ICD-10-CM | POA: Diagnosis not present

## 2023-12-30 DIAGNOSIS — G2581 Restless legs syndrome: Secondary | ICD-10-CM | POA: Diagnosis not present

## 2023-12-30 DIAGNOSIS — E118 Type 2 diabetes mellitus with unspecified complications: Secondary | ICD-10-CM | POA: Diagnosis not present

## 2023-12-30 DIAGNOSIS — I480 Paroxysmal atrial fibrillation: Secondary | ICD-10-CM | POA: Diagnosis not present

## 2023-12-30 DIAGNOSIS — E119 Type 2 diabetes mellitus without complications: Secondary | ICD-10-CM | POA: Diagnosis not present

## 2023-12-30 DIAGNOSIS — E66813 Obesity, class 3: Secondary | ICD-10-CM | POA: Diagnosis not present

## 2023-12-30 DIAGNOSIS — K7581 Nonalcoholic steatohepatitis (NASH): Secondary | ICD-10-CM | POA: Diagnosis not present

## 2023-12-30 DIAGNOSIS — N139 Obstructive and reflux uropathy, unspecified: Secondary | ICD-10-CM | POA: Diagnosis not present

## 2023-12-30 DIAGNOSIS — N401 Enlarged prostate with lower urinary tract symptoms: Secondary | ICD-10-CM | POA: Diagnosis not present

## 2023-12-31 DIAGNOSIS — S3131XD Laceration without foreign body of scrotum and testes, subsequent encounter: Secondary | ICD-10-CM | POA: Diagnosis not present

## 2023-12-31 DIAGNOSIS — N139 Obstructive and reflux uropathy, unspecified: Secondary | ICD-10-CM | POA: Diagnosis not present

## 2023-12-31 DIAGNOSIS — E66813 Obesity, class 3: Secondary | ICD-10-CM | POA: Diagnosis not present

## 2023-12-31 DIAGNOSIS — K7581 Nonalcoholic steatohepatitis (NASH): Secondary | ICD-10-CM | POA: Diagnosis not present

## 2023-12-31 DIAGNOSIS — N401 Enlarged prostate with lower urinary tract symptoms: Secondary | ICD-10-CM | POA: Diagnosis not present

## 2023-12-31 DIAGNOSIS — E1122 Type 2 diabetes mellitus with diabetic chronic kidney disease: Secondary | ICD-10-CM | POA: Diagnosis not present

## 2023-12-31 DIAGNOSIS — N1831 Chronic kidney disease, stage 3a: Secondary | ICD-10-CM | POA: Diagnosis not present

## 2023-12-31 DIAGNOSIS — G2581 Restless legs syndrome: Secondary | ICD-10-CM | POA: Diagnosis not present

## 2023-12-31 DIAGNOSIS — K7460 Unspecified cirrhosis of liver: Secondary | ICD-10-CM | POA: Diagnosis not present

## 2023-12-31 DIAGNOSIS — E785 Hyperlipidemia, unspecified: Secondary | ICD-10-CM | POA: Diagnosis not present

## 2023-12-31 DIAGNOSIS — I480 Paroxysmal atrial fibrillation: Secondary | ICD-10-CM | POA: Diagnosis not present

## 2024-01-01 ENCOUNTER — Inpatient Hospital Stay: Admitting: Family Medicine

## 2024-01-02 DIAGNOSIS — N401 Enlarged prostate with lower urinary tract symptoms: Secondary | ICD-10-CM | POA: Diagnosis not present

## 2024-01-02 DIAGNOSIS — N139 Obstructive and reflux uropathy, unspecified: Secondary | ICD-10-CM | POA: Diagnosis not present

## 2024-01-02 DIAGNOSIS — E785 Hyperlipidemia, unspecified: Secondary | ICD-10-CM | POA: Diagnosis not present

## 2024-01-02 DIAGNOSIS — I480 Paroxysmal atrial fibrillation: Secondary | ICD-10-CM | POA: Diagnosis not present

## 2024-01-02 DIAGNOSIS — E66813 Obesity, class 3: Secondary | ICD-10-CM | POA: Diagnosis not present

## 2024-01-02 DIAGNOSIS — K7581 Nonalcoholic steatohepatitis (NASH): Secondary | ICD-10-CM | POA: Diagnosis not present

## 2024-01-02 DIAGNOSIS — S3131XD Laceration without foreign body of scrotum and testes, subsequent encounter: Secondary | ICD-10-CM | POA: Diagnosis not present

## 2024-01-02 DIAGNOSIS — G2581 Restless legs syndrome: Secondary | ICD-10-CM | POA: Diagnosis not present

## 2024-01-02 DIAGNOSIS — E1122 Type 2 diabetes mellitus with diabetic chronic kidney disease: Secondary | ICD-10-CM | POA: Diagnosis not present

## 2024-01-02 DIAGNOSIS — N1831 Chronic kidney disease, stage 3a: Secondary | ICD-10-CM | POA: Diagnosis not present

## 2024-01-02 DIAGNOSIS — K7460 Unspecified cirrhosis of liver: Secondary | ICD-10-CM | POA: Diagnosis not present

## 2024-01-03 DIAGNOSIS — E119 Type 2 diabetes mellitus without complications: Secondary | ICD-10-CM | POA: Diagnosis not present

## 2024-01-03 DIAGNOSIS — E66813 Obesity, class 3: Secondary | ICD-10-CM | POA: Diagnosis not present

## 2024-01-03 DIAGNOSIS — N401 Enlarged prostate with lower urinary tract symptoms: Secondary | ICD-10-CM | POA: Diagnosis not present

## 2024-01-03 DIAGNOSIS — G2581 Restless legs syndrome: Secondary | ICD-10-CM | POA: Diagnosis not present

## 2024-01-03 DIAGNOSIS — I480 Paroxysmal atrial fibrillation: Secondary | ICD-10-CM | POA: Diagnosis not present

## 2024-01-03 DIAGNOSIS — N1831 Chronic kidney disease, stage 3a: Secondary | ICD-10-CM | POA: Diagnosis not present

## 2024-01-03 DIAGNOSIS — E785 Hyperlipidemia, unspecified: Secondary | ICD-10-CM | POA: Diagnosis not present

## 2024-01-03 DIAGNOSIS — K7581 Nonalcoholic steatohepatitis (NASH): Secondary | ICD-10-CM | POA: Diagnosis not present

## 2024-01-03 DIAGNOSIS — S3131XD Laceration without foreign body of scrotum and testes, subsequent encounter: Secondary | ICD-10-CM | POA: Diagnosis not present

## 2024-01-03 DIAGNOSIS — N139 Obstructive and reflux uropathy, unspecified: Secondary | ICD-10-CM | POA: Diagnosis not present

## 2024-01-03 DIAGNOSIS — K7460 Unspecified cirrhosis of liver: Secondary | ICD-10-CM | POA: Diagnosis not present

## 2024-01-05 DIAGNOSIS — E119 Type 2 diabetes mellitus without complications: Secondary | ICD-10-CM | POA: Diagnosis not present

## 2024-01-05 DIAGNOSIS — K7581 Nonalcoholic steatohepatitis (NASH): Secondary | ICD-10-CM | POA: Diagnosis not present

## 2024-01-05 DIAGNOSIS — G2581 Restless legs syndrome: Secondary | ICD-10-CM | POA: Diagnosis not present

## 2024-01-05 DIAGNOSIS — N401 Enlarged prostate with lower urinary tract symptoms: Secondary | ICD-10-CM | POA: Diagnosis not present

## 2024-01-05 DIAGNOSIS — S3131XD Laceration without foreign body of scrotum and testes, subsequent encounter: Secondary | ICD-10-CM | POA: Diagnosis not present

## 2024-01-05 DIAGNOSIS — N1831 Chronic kidney disease, stage 3a: Secondary | ICD-10-CM | POA: Diagnosis not present

## 2024-01-05 DIAGNOSIS — N139 Obstructive and reflux uropathy, unspecified: Secondary | ICD-10-CM | POA: Diagnosis not present

## 2024-01-05 DIAGNOSIS — E785 Hyperlipidemia, unspecified: Secondary | ICD-10-CM | POA: Diagnosis not present

## 2024-01-05 DIAGNOSIS — I480 Paroxysmal atrial fibrillation: Secondary | ICD-10-CM | POA: Diagnosis not present

## 2024-01-05 DIAGNOSIS — K7460 Unspecified cirrhosis of liver: Secondary | ICD-10-CM | POA: Diagnosis not present

## 2024-01-05 DIAGNOSIS — E66813 Obesity, class 3: Secondary | ICD-10-CM | POA: Diagnosis not present

## 2024-01-06 DIAGNOSIS — E785 Hyperlipidemia, unspecified: Secondary | ICD-10-CM | POA: Diagnosis not present

## 2024-01-06 DIAGNOSIS — E1122 Type 2 diabetes mellitus with diabetic chronic kidney disease: Secondary | ICD-10-CM | POA: Diagnosis not present

## 2024-01-06 DIAGNOSIS — N1831 Chronic kidney disease, stage 3a: Secondary | ICD-10-CM | POA: Diagnosis not present

## 2024-01-06 DIAGNOSIS — I480 Paroxysmal atrial fibrillation: Secondary | ICD-10-CM | POA: Diagnosis not present

## 2024-01-06 DIAGNOSIS — K7460 Unspecified cirrhosis of liver: Secondary | ICD-10-CM | POA: Diagnosis not present

## 2024-01-06 DIAGNOSIS — N139 Obstructive and reflux uropathy, unspecified: Secondary | ICD-10-CM | POA: Diagnosis not present

## 2024-01-06 DIAGNOSIS — L89153 Pressure ulcer of sacral region, stage 3: Secondary | ICD-10-CM | POA: Diagnosis not present

## 2024-01-06 DIAGNOSIS — S3131XD Laceration without foreign body of scrotum and testes, subsequent encounter: Secondary | ICD-10-CM | POA: Diagnosis not present

## 2024-01-06 DIAGNOSIS — L97811 Non-pressure chronic ulcer of other part of right lower leg limited to breakdown of skin: Secondary | ICD-10-CM | POA: Diagnosis not present

## 2024-01-06 DIAGNOSIS — K7581 Nonalcoholic steatohepatitis (NASH): Secondary | ICD-10-CM | POA: Diagnosis not present

## 2024-01-06 DIAGNOSIS — E66813 Obesity, class 3: Secondary | ICD-10-CM | POA: Diagnosis not present

## 2024-01-06 DIAGNOSIS — L89313 Pressure ulcer of right buttock, stage 3: Secondary | ICD-10-CM | POA: Diagnosis not present

## 2024-01-06 DIAGNOSIS — L97821 Non-pressure chronic ulcer of other part of left lower leg limited to breakdown of skin: Secondary | ICD-10-CM | POA: Diagnosis not present

## 2024-01-06 DIAGNOSIS — G2581 Restless legs syndrome: Secondary | ICD-10-CM | POA: Diagnosis not present

## 2024-01-06 DIAGNOSIS — N401 Enlarged prostate with lower urinary tract symptoms: Secondary | ICD-10-CM | POA: Diagnosis not present

## 2024-01-07 DIAGNOSIS — E119 Type 2 diabetes mellitus without complications: Secondary | ICD-10-CM | POA: Diagnosis not present

## 2024-01-07 DIAGNOSIS — K7460 Unspecified cirrhosis of liver: Secondary | ICD-10-CM | POA: Diagnosis not present

## 2024-01-07 DIAGNOSIS — N1831 Chronic kidney disease, stage 3a: Secondary | ICD-10-CM | POA: Diagnosis not present

## 2024-01-07 DIAGNOSIS — E785 Hyperlipidemia, unspecified: Secondary | ICD-10-CM | POA: Diagnosis not present

## 2024-01-07 DIAGNOSIS — S3131XD Laceration without foreign body of scrotum and testes, subsequent encounter: Secondary | ICD-10-CM | POA: Diagnosis not present

## 2024-01-07 DIAGNOSIS — I480 Paroxysmal atrial fibrillation: Secondary | ICD-10-CM | POA: Diagnosis not present

## 2024-01-07 DIAGNOSIS — N139 Obstructive and reflux uropathy, unspecified: Secondary | ICD-10-CM | POA: Diagnosis not present

## 2024-01-07 DIAGNOSIS — E66813 Obesity, class 3: Secondary | ICD-10-CM | POA: Diagnosis not present

## 2024-01-07 DIAGNOSIS — N401 Enlarged prostate with lower urinary tract symptoms: Secondary | ICD-10-CM | POA: Diagnosis not present

## 2024-01-07 DIAGNOSIS — E118 Type 2 diabetes mellitus with unspecified complications: Secondary | ICD-10-CM | POA: Diagnosis not present

## 2024-01-07 DIAGNOSIS — K7581 Nonalcoholic steatohepatitis (NASH): Secondary | ICD-10-CM | POA: Diagnosis not present

## 2024-01-07 DIAGNOSIS — G2581 Restless legs syndrome: Secondary | ICD-10-CM | POA: Diagnosis not present

## 2024-01-08 DIAGNOSIS — R5383 Other fatigue: Secondary | ICD-10-CM | POA: Diagnosis not present

## 2024-01-08 DIAGNOSIS — E66813 Obesity, class 3: Secondary | ICD-10-CM | POA: Diagnosis not present

## 2024-01-08 DIAGNOSIS — N1831 Chronic kidney disease, stage 3a: Secondary | ICD-10-CM | POA: Diagnosis not present

## 2024-01-08 DIAGNOSIS — N139 Obstructive and reflux uropathy, unspecified: Secondary | ICD-10-CM | POA: Diagnosis not present

## 2024-01-08 DIAGNOSIS — K7581 Nonalcoholic steatohepatitis (NASH): Secondary | ICD-10-CM | POA: Diagnosis not present

## 2024-01-08 DIAGNOSIS — E669 Obesity, unspecified: Secondary | ICD-10-CM | POA: Diagnosis not present

## 2024-01-08 DIAGNOSIS — E785 Hyperlipidemia, unspecified: Secondary | ICD-10-CM | POA: Diagnosis not present

## 2024-01-08 DIAGNOSIS — Z7401 Bed confinement status: Secondary | ICD-10-CM | POA: Diagnosis not present

## 2024-01-08 DIAGNOSIS — R531 Weakness: Secondary | ICD-10-CM | POA: Diagnosis not present

## 2024-01-08 DIAGNOSIS — S3131XD Laceration without foreign body of scrotum and testes, subsequent encounter: Secondary | ICD-10-CM | POA: Diagnosis not present

## 2024-01-08 DIAGNOSIS — K7460 Unspecified cirrhosis of liver: Secondary | ICD-10-CM | POA: Diagnosis not present

## 2024-01-08 DIAGNOSIS — G2581 Restless legs syndrome: Secondary | ICD-10-CM | POA: Diagnosis not present

## 2024-01-08 DIAGNOSIS — N401 Enlarged prostate with lower urinary tract symptoms: Secondary | ICD-10-CM | POA: Diagnosis not present

## 2024-01-08 DIAGNOSIS — I480 Paroxysmal atrial fibrillation: Secondary | ICD-10-CM | POA: Diagnosis not present

## 2024-01-08 DIAGNOSIS — E118 Type 2 diabetes mellitus with unspecified complications: Secondary | ICD-10-CM | POA: Diagnosis not present

## 2024-01-08 DIAGNOSIS — S3131XA Laceration without foreign body of scrotum and testes, initial encounter: Secondary | ICD-10-CM | POA: Diagnosis not present

## 2024-01-09 DIAGNOSIS — K7581 Nonalcoholic steatohepatitis (NASH): Secondary | ICD-10-CM | POA: Diagnosis not present

## 2024-01-09 DIAGNOSIS — K7460 Unspecified cirrhosis of liver: Secondary | ICD-10-CM | POA: Diagnosis not present

## 2024-01-09 DIAGNOSIS — E119 Type 2 diabetes mellitus without complications: Secondary | ICD-10-CM | POA: Diagnosis not present

## 2024-01-09 DIAGNOSIS — S3131XD Laceration without foreign body of scrotum and testes, subsequent encounter: Secondary | ICD-10-CM | POA: Diagnosis not present

## 2024-01-09 DIAGNOSIS — E66813 Obesity, class 3: Secondary | ICD-10-CM | POA: Diagnosis not present

## 2024-01-09 DIAGNOSIS — N139 Obstructive and reflux uropathy, unspecified: Secondary | ICD-10-CM | POA: Diagnosis not present

## 2024-01-09 DIAGNOSIS — N1831 Chronic kidney disease, stage 3a: Secondary | ICD-10-CM | POA: Diagnosis not present

## 2024-01-09 DIAGNOSIS — N401 Enlarged prostate with lower urinary tract symptoms: Secondary | ICD-10-CM | POA: Diagnosis not present

## 2024-01-09 DIAGNOSIS — G2581 Restless legs syndrome: Secondary | ICD-10-CM | POA: Diagnosis not present

## 2024-01-09 DIAGNOSIS — E785 Hyperlipidemia, unspecified: Secondary | ICD-10-CM | POA: Diagnosis not present

## 2024-01-09 DIAGNOSIS — I480 Paroxysmal atrial fibrillation: Secondary | ICD-10-CM | POA: Diagnosis not present

## 2024-01-10 DIAGNOSIS — E119 Type 2 diabetes mellitus without complications: Secondary | ICD-10-CM | POA: Diagnosis not present

## 2024-01-10 DIAGNOSIS — E785 Hyperlipidemia, unspecified: Secondary | ICD-10-CM | POA: Diagnosis not present

## 2024-01-10 DIAGNOSIS — N401 Enlarged prostate with lower urinary tract symptoms: Secondary | ICD-10-CM | POA: Diagnosis not present

## 2024-01-10 DIAGNOSIS — G2581 Restless legs syndrome: Secondary | ICD-10-CM | POA: Diagnosis not present

## 2024-01-10 DIAGNOSIS — N1831 Chronic kidney disease, stage 3a: Secondary | ICD-10-CM | POA: Diagnosis not present

## 2024-01-10 DIAGNOSIS — N139 Obstructive and reflux uropathy, unspecified: Secondary | ICD-10-CM | POA: Diagnosis not present

## 2024-01-10 DIAGNOSIS — R062 Wheezing: Secondary | ICD-10-CM | POA: Diagnosis not present

## 2024-01-10 DIAGNOSIS — S3131XD Laceration without foreign body of scrotum and testes, subsequent encounter: Secondary | ICD-10-CM | POA: Diagnosis not present

## 2024-01-10 DIAGNOSIS — K7581 Nonalcoholic steatohepatitis (NASH): Secondary | ICD-10-CM | POA: Diagnosis not present

## 2024-01-10 DIAGNOSIS — E66813 Obesity, class 3: Secondary | ICD-10-CM | POA: Diagnosis not present

## 2024-01-10 DIAGNOSIS — K7460 Unspecified cirrhosis of liver: Secondary | ICD-10-CM | POA: Diagnosis not present

## 2024-01-10 DIAGNOSIS — I480 Paroxysmal atrial fibrillation: Secondary | ICD-10-CM | POA: Diagnosis not present

## 2024-01-10 DIAGNOSIS — R0602 Shortness of breath: Secondary | ICD-10-CM | POA: Diagnosis not present

## 2024-01-11 DIAGNOSIS — K7581 Nonalcoholic steatohepatitis (NASH): Secondary | ICD-10-CM | POA: Diagnosis not present

## 2024-01-11 DIAGNOSIS — R0602 Shortness of breath: Secondary | ICD-10-CM | POA: Diagnosis not present

## 2024-01-11 DIAGNOSIS — E1122 Type 2 diabetes mellitus with diabetic chronic kidney disease: Secondary | ICD-10-CM | POA: Diagnosis not present

## 2024-01-11 DIAGNOSIS — N401 Enlarged prostate with lower urinary tract symptoms: Secondary | ICD-10-CM | POA: Diagnosis not present

## 2024-01-11 DIAGNOSIS — S3131XD Laceration without foreign body of scrotum and testes, subsequent encounter: Secondary | ICD-10-CM | POA: Diagnosis not present

## 2024-01-11 DIAGNOSIS — G2581 Restless legs syndrome: Secondary | ICD-10-CM | POA: Diagnosis not present

## 2024-01-11 DIAGNOSIS — E66813 Obesity, class 3: Secondary | ICD-10-CM | POA: Diagnosis not present

## 2024-01-11 DIAGNOSIS — E785 Hyperlipidemia, unspecified: Secondary | ICD-10-CM | POA: Diagnosis not present

## 2024-01-11 DIAGNOSIS — N1831 Chronic kidney disease, stage 3a: Secondary | ICD-10-CM | POA: Diagnosis not present

## 2024-01-11 DIAGNOSIS — N139 Obstructive and reflux uropathy, unspecified: Secondary | ICD-10-CM | POA: Diagnosis not present

## 2024-01-11 DIAGNOSIS — K7460 Unspecified cirrhosis of liver: Secondary | ICD-10-CM | POA: Diagnosis not present

## 2024-01-11 DIAGNOSIS — I480 Paroxysmal atrial fibrillation: Secondary | ICD-10-CM | POA: Diagnosis not present

## 2024-01-12 DIAGNOSIS — G2581 Restless legs syndrome: Secondary | ICD-10-CM | POA: Diagnosis not present

## 2024-01-12 DIAGNOSIS — L89313 Pressure ulcer of right buttock, stage 3: Secondary | ICD-10-CM | POA: Diagnosis not present

## 2024-01-12 DIAGNOSIS — L97821 Non-pressure chronic ulcer of other part of left lower leg limited to breakdown of skin: Secondary | ICD-10-CM | POA: Diagnosis not present

## 2024-01-12 DIAGNOSIS — N401 Enlarged prostate with lower urinary tract symptoms: Secondary | ICD-10-CM | POA: Diagnosis not present

## 2024-01-12 DIAGNOSIS — L97811 Non-pressure chronic ulcer of other part of right lower leg limited to breakdown of skin: Secondary | ICD-10-CM | POA: Diagnosis not present

## 2024-01-12 DIAGNOSIS — K7460 Unspecified cirrhosis of liver: Secondary | ICD-10-CM | POA: Diagnosis not present

## 2024-01-12 DIAGNOSIS — K7581 Nonalcoholic steatohepatitis (NASH): Secondary | ICD-10-CM | POA: Diagnosis not present

## 2024-01-12 DIAGNOSIS — E785 Hyperlipidemia, unspecified: Secondary | ICD-10-CM | POA: Diagnosis not present

## 2024-01-12 DIAGNOSIS — S3131XD Laceration without foreign body of scrotum and testes, subsequent encounter: Secondary | ICD-10-CM | POA: Diagnosis not present

## 2024-01-12 DIAGNOSIS — I480 Paroxysmal atrial fibrillation: Secondary | ICD-10-CM | POA: Diagnosis not present

## 2024-01-12 DIAGNOSIS — N1831 Chronic kidney disease, stage 3a: Secondary | ICD-10-CM | POA: Diagnosis not present

## 2024-01-12 DIAGNOSIS — L89153 Pressure ulcer of sacral region, stage 3: Secondary | ICD-10-CM | POA: Diagnosis not present

## 2024-01-12 DIAGNOSIS — N139 Obstructive and reflux uropathy, unspecified: Secondary | ICD-10-CM | POA: Diagnosis not present

## 2024-01-12 DIAGNOSIS — E1122 Type 2 diabetes mellitus with diabetic chronic kidney disease: Secondary | ICD-10-CM | POA: Diagnosis not present

## 2024-01-12 DIAGNOSIS — E66813 Obesity, class 3: Secondary | ICD-10-CM | POA: Diagnosis not present

## 2024-01-13 DIAGNOSIS — E66813 Obesity, class 3: Secondary | ICD-10-CM | POA: Diagnosis not present

## 2024-01-13 DIAGNOSIS — I480 Paroxysmal atrial fibrillation: Secondary | ICD-10-CM | POA: Diagnosis not present

## 2024-01-13 DIAGNOSIS — K7460 Unspecified cirrhosis of liver: Secondary | ICD-10-CM | POA: Diagnosis not present

## 2024-01-13 DIAGNOSIS — E785 Hyperlipidemia, unspecified: Secondary | ICD-10-CM | POA: Diagnosis not present

## 2024-01-13 DIAGNOSIS — S3131XD Laceration without foreign body of scrotum and testes, subsequent encounter: Secondary | ICD-10-CM | POA: Diagnosis not present

## 2024-01-13 DIAGNOSIS — E118 Type 2 diabetes mellitus with unspecified complications: Secondary | ICD-10-CM | POA: Diagnosis not present

## 2024-01-13 DIAGNOSIS — N401 Enlarged prostate with lower urinary tract symptoms: Secondary | ICD-10-CM | POA: Diagnosis not present

## 2024-01-13 DIAGNOSIS — E119 Type 2 diabetes mellitus without complications: Secondary | ICD-10-CM | POA: Diagnosis not present

## 2024-01-13 DIAGNOSIS — G2581 Restless legs syndrome: Secondary | ICD-10-CM | POA: Diagnosis not present

## 2024-01-13 DIAGNOSIS — N1831 Chronic kidney disease, stage 3a: Secondary | ICD-10-CM | POA: Diagnosis not present

## 2024-01-13 DIAGNOSIS — N139 Obstructive and reflux uropathy, unspecified: Secondary | ICD-10-CM | POA: Diagnosis not present

## 2024-01-13 DIAGNOSIS — K7581 Nonalcoholic steatohepatitis (NASH): Secondary | ICD-10-CM | POA: Diagnosis not present

## 2024-01-14 DIAGNOSIS — G2581 Restless legs syndrome: Secondary | ICD-10-CM | POA: Diagnosis not present

## 2024-01-14 DIAGNOSIS — N139 Obstructive and reflux uropathy, unspecified: Secondary | ICD-10-CM | POA: Diagnosis not present

## 2024-01-14 DIAGNOSIS — S3131XD Laceration without foreign body of scrotum and testes, subsequent encounter: Secondary | ICD-10-CM | POA: Diagnosis not present

## 2024-01-14 DIAGNOSIS — E785 Hyperlipidemia, unspecified: Secondary | ICD-10-CM | POA: Diagnosis not present

## 2024-01-14 DIAGNOSIS — K7581 Nonalcoholic steatohepatitis (NASH): Secondary | ICD-10-CM | POA: Diagnosis not present

## 2024-01-14 DIAGNOSIS — E118 Type 2 diabetes mellitus with unspecified complications: Secondary | ICD-10-CM | POA: Diagnosis not present

## 2024-01-14 DIAGNOSIS — N401 Enlarged prostate with lower urinary tract symptoms: Secondary | ICD-10-CM | POA: Diagnosis not present

## 2024-01-14 DIAGNOSIS — K7460 Unspecified cirrhosis of liver: Secondary | ICD-10-CM | POA: Diagnosis not present

## 2024-01-14 DIAGNOSIS — E66813 Obesity, class 3: Secondary | ICD-10-CM | POA: Diagnosis not present

## 2024-01-14 DIAGNOSIS — I480 Paroxysmal atrial fibrillation: Secondary | ICD-10-CM | POA: Diagnosis not present

## 2024-01-14 DIAGNOSIS — N1831 Chronic kidney disease, stage 3a: Secondary | ICD-10-CM | POA: Diagnosis not present

## 2024-01-16 ENCOUNTER — Other Ambulatory Visit: Payer: Self-pay | Admitting: Family Medicine

## 2024-01-16 DIAGNOSIS — E118 Type 2 diabetes mellitus with unspecified complications: Secondary | ICD-10-CM | POA: Diagnosis not present

## 2024-01-16 DIAGNOSIS — E66813 Obesity, class 3: Secondary | ICD-10-CM | POA: Diagnosis not present

## 2024-01-16 DIAGNOSIS — I480 Paroxysmal atrial fibrillation: Secondary | ICD-10-CM | POA: Diagnosis not present

## 2024-01-16 DIAGNOSIS — G2581 Restless legs syndrome: Secondary | ICD-10-CM | POA: Diagnosis not present

## 2024-01-16 DIAGNOSIS — N1831 Chronic kidney disease, stage 3a: Secondary | ICD-10-CM | POA: Diagnosis not present

## 2024-01-16 DIAGNOSIS — K7460 Unspecified cirrhosis of liver: Secondary | ICD-10-CM | POA: Diagnosis not present

## 2024-01-16 DIAGNOSIS — E119 Type 2 diabetes mellitus without complications: Secondary | ICD-10-CM | POA: Diagnosis not present

## 2024-01-16 DIAGNOSIS — N139 Obstructive and reflux uropathy, unspecified: Secondary | ICD-10-CM | POA: Diagnosis not present

## 2024-01-16 DIAGNOSIS — E785 Hyperlipidemia, unspecified: Secondary | ICD-10-CM | POA: Diagnosis not present

## 2024-01-16 DIAGNOSIS — K7581 Nonalcoholic steatohepatitis (NASH): Secondary | ICD-10-CM | POA: Diagnosis not present

## 2024-01-16 DIAGNOSIS — S3131XD Laceration without foreign body of scrotum and testes, subsequent encounter: Secondary | ICD-10-CM | POA: Diagnosis not present

## 2024-01-16 DIAGNOSIS — N401 Enlarged prostate with lower urinary tract symptoms: Secondary | ICD-10-CM | POA: Diagnosis not present

## 2024-01-16 NOTE — Telephone Encounter (Signed)
 Copied from CRM [id number]. Topic: Clinical - Medication Question >> Jan 16, 2024 12:47 PM Shereese L wrote: Reason for CRM: patient would like to receive a call back. He's in need of a breathing machine and was given one in the er and states that he's still in need of it

## 2024-01-19 ENCOUNTER — Ambulatory Visit: Payer: Self-pay

## 2024-01-19 NOTE — Telephone Encounter (Signed)
 Chief Complaint: difficulty breathing, congestion Symptoms: see above Frequency: since 3/21 Pertinent Negatives: Patient denies fever, CP, N/V/D Disposition: [] ED /[] Urgent Care (no appt availability in office) / [] Appointment(In office/virtual)/ []  [facility]/ [] Home Care/ [] Refused Recommended Disposition /[] [facility]/ [x]  Follow-up with PCP Additional Notes: RN spoke to pt's wife. On Friday pt was discharged from a SNF. Pt has had congestion, drainage, and difficulty breathing especially while lying flat since 3/21. Pt was using O2 intermittently while in the hospital and in the SNF. Today pt's difficulty breathing is no worse than it has been. Pt reports difficulty breathing mostly while lying flat but denies any SOB at rest sitting up. Pt has a productive cough with clear sputum. Denies fever, CP, palpitations, N/V/D. Wife has questions for the office about getting orders for home O2 as well as needing assistance with PCS forms because pt is bedbound and needs stretcher transport. Wife asked for a virtual visit to speak with Dr. Arliss Lam about these issues. RN called the CAL, CAL stated HFU visits cannot be held virtually. Wife states pt is bedbound and unable to be seen in person at this time. Wife is still waiting on a Hoyer lift to be delivered. Wife states home health will be by later this week to assist with wound care.  Wife stated there is no way the pt can physically make it into the office. RN advised wife RN would relay concern and questions to the office for follow-up. Please follow-up with the wife about O2 and PCS forms. RN advised wife that if the pt experiences worsening difficulty breathing, CP, or any new or worsening symptoms that she needs to call 911. At this time, pt's symptoms are no worse than they were at the SNF. Pt is taking Mucinex . Wife does not have a pulse ox so she does not know how O2 sat at this time.   Please follow-up with the  wife.    Copied from CRM [id number]. Topic: Clinical - Red Word Triage >> Jan 19, 2024  8:41 AM Freya Jesus wrote: Red Word that prompted transfer to Nurse Triage: Patient wife stated that he was sent home from  nursing/rehab and patient is congested and having a hard time breathing. Reason for Disposition  [1] MILD longstanding difficulty breathing AND [2]  SAME as normal    Per spouse, pt has had congestion and difficulty breathing while lying flat since 3/21  Answer Assessment - Initial Assessment Questions 1. RESPIRATORY STATUS: "Describe your breathing?" (e.g., wheezing, shortness of breath, unable to speak, severe coughing)      Trouble breathing while laying flat 2. ONSET: "When did this breathing problem begin?"      Discharged from SNF on Friday, was having difficulty breathing at that time while in the SNF. Went into the hospital in [place], then went to rehab. 3/21-past Friday he was in the rehab facility. Was wearing oxygen then PRN. 3. PATTERN "Does the difficult breathing come and go, or has it been constant since it started?"      Comes and goes, worse with lying flat  4. SEVERITY: "How bad is your breathing?" (e.g., mild, moderate, severe)    - MILD: No SOB at rest, mild SOB with walking, speaks normally in sentences, can lie down, no retractions, pulse < 100.    - MODERATE: SOB at rest, SOB with minimal exertion and prefers to sit, cannot lie down flat, speaks in phrases, mild retractions, audible wheezing, pulse 100-120.    -  SEVERE: Very SOB at rest, speaks in single words, struggling to breathe, sitting hunched forward, retractions, pulse > 120      Bed bound at this time - wife states pt is not always SOB, but breathing is worse lying flat. Occasional wheezing. Denies SOB "all the time" 5. RECURRENT SYMPTOM: "Have you had difficulty breathing before?" If Yes, ask: "When was the last time?" and "What happened that time?"      Not until now 6. CARDIAC HISTORY: "Do you have  any history of heart disease?" (e.g., heart attack, angina, bypass surgery, angioplasty)      CAD, HTN, Afib 7. LUNG HISTORY: "Do you have any history of lung disease?"  (e.g., pulmonary embolus, asthma, emphysema)     No 8. CAUSE: "What do you think is causing the breathing problem?"      "Drainage" 9. OTHER SYMPTOMS: "Do you have any other symptoms? (e.g., dizziness, runny nose, cough, chest pain, fever)     Productive cough with clear sputum. Denies fever or CP. Endorses a little runny nose. Denies N/V/D. 10. O2 SATURATION MONITOR:  "Do you use an oxygen saturation monitor (pulse oximeter) at home?" If Yes, ask: "What is your reading (oxygen level) today?" "What is your usual oxygen saturation reading?" (e.g., 95%)       Do not have one  Home health care to be reinstated for wound care. Needs O2. Spouse states he had a paracentesis on 3/24, needs it again on 5/1 or 5/2. Needs stretcher transport, needs PCS form to say it was medically necessary so that insurance will help pay for it. Spouse would like Cinda Craze to assist with those forms.  Protocols used: Breathing Difficulty-A-AH

## 2024-01-19 NOTE — Telephone Encounter (Signed)
 Per ED note: #) Baseline debility: Patient being discharged to skilled nursing facility for help  Would you like an office visit?

## 2024-01-20 ENCOUNTER — Telehealth: Payer: Self-pay

## 2024-01-20 DIAGNOSIS — Z7984 Long term (current) use of oral hypoglycemic drugs: Secondary | ICD-10-CM | POA: Diagnosis not present

## 2024-01-20 DIAGNOSIS — N138 Other obstructive and reflux uropathy: Secondary | ICD-10-CM | POA: Diagnosis not present

## 2024-01-20 DIAGNOSIS — Z6834 Body mass index (BMI) 34.0-34.9, adult: Secondary | ICD-10-CM | POA: Diagnosis not present

## 2024-01-20 DIAGNOSIS — K7581 Nonalcoholic steatohepatitis (NASH): Secondary | ICD-10-CM | POA: Diagnosis not present

## 2024-01-20 DIAGNOSIS — N1831 Chronic kidney disease, stage 3a: Secondary | ICD-10-CM | POA: Diagnosis not present

## 2024-01-20 DIAGNOSIS — N401 Enlarged prostate with lower urinary tract symptoms: Secondary | ICD-10-CM | POA: Diagnosis not present

## 2024-01-20 DIAGNOSIS — E66813 Obesity, class 3: Secondary | ICD-10-CM | POA: Diagnosis not present

## 2024-01-20 DIAGNOSIS — E1122 Type 2 diabetes mellitus with diabetic chronic kidney disease: Secondary | ICD-10-CM | POA: Diagnosis not present

## 2024-01-20 DIAGNOSIS — Z7901 Long term (current) use of anticoagulants: Secondary | ICD-10-CM | POA: Diagnosis not present

## 2024-01-20 DIAGNOSIS — K7460 Unspecified cirrhosis of liver: Secondary | ICD-10-CM | POA: Diagnosis not present

## 2024-01-20 DIAGNOSIS — S3131XD Laceration without foreign body of scrotum and testes, subsequent encounter: Secondary | ICD-10-CM | POA: Diagnosis not present

## 2024-01-20 DIAGNOSIS — E1142 Type 2 diabetes mellitus with diabetic polyneuropathy: Secondary | ICD-10-CM | POA: Diagnosis not present

## 2024-01-20 DIAGNOSIS — Z794 Long term (current) use of insulin: Secondary | ICD-10-CM | POA: Diagnosis not present

## 2024-01-20 DIAGNOSIS — I480 Paroxysmal atrial fibrillation: Secondary | ICD-10-CM | POA: Diagnosis not present

## 2024-01-20 DIAGNOSIS — I129 Hypertensive chronic kidney disease with stage 1 through stage 4 chronic kidney disease, or unspecified chronic kidney disease: Secondary | ICD-10-CM | POA: Diagnosis not present

## 2024-01-20 DIAGNOSIS — I272 Pulmonary hypertension, unspecified: Secondary | ICD-10-CM | POA: Diagnosis not present

## 2024-01-20 NOTE — Telephone Encounter (Signed)
 Copied from CRM [id number]. Topic: Clinical - Home Health Verbal Orders >> Jan 20, 2024  3:15 PM Freya Jesus wrote: [facility]: [phone]  -- Office # -[phone] Service Requested: Skilled Nursing -- stage 2 on both his right and left buttocks: need wound care orders for those. Will be applying Medihoney and foam dressing, Need Foley catheter  changed monthly. Respiratory therapy referral to assist for home oxygen needs. Need prescription for hower lift. Will send a home health aide 2 times a week. (Will send orders in for signature) Frequency: 2 weeks 4/ and 1 week 5 Any new concerns about the patient? Yes, buttocks wounds, drainage in back of throat, sinus/congestion

## 2024-01-22 NOTE — Telephone Encounter (Signed)
 Spoke with Margo Pen from [facility], gave VO per Dr. Arliss Lam, for wound care

## 2024-01-22 NOTE — Telephone Encounter (Signed)
 Sch patient for a Mychart Video visit per Dr. Arliss Lam

## 2024-01-23 ENCOUNTER — Telehealth: Admitting: Family Medicine

## 2024-01-23 ENCOUNTER — Encounter: Payer: Self-pay | Admitting: Family Medicine

## 2024-01-23 DIAGNOSIS — R059 Cough, unspecified: Secondary | ICD-10-CM | POA: Diagnosis not present

## 2024-01-23 DIAGNOSIS — L89309 Pressure ulcer of unspecified buttock, unspecified stage: Secondary | ICD-10-CM

## 2024-01-23 DIAGNOSIS — R188 Other ascites: Secondary | ICD-10-CM | POA: Diagnosis not present

## 2024-01-23 DIAGNOSIS — Z66 Do not resuscitate: Secondary | ICD-10-CM

## 2024-01-23 DIAGNOSIS — K7460 Unspecified cirrhosis of liver: Secondary | ICD-10-CM | POA: Diagnosis not present

## 2024-01-23 DIAGNOSIS — S3131XD Laceration without foreign body of scrotum and testes, subsequent encounter: Secondary | ICD-10-CM

## 2024-01-23 NOTE — Progress Notes (Signed)
 Patient was unable to self-report due to a lack of equipment at home via telehealth

## 2024-01-23 NOTE — Progress Notes (Signed)
 Virtual Visit via Video Note  I connected with Jimmy Carr on 01/23/24 at  1:30 PM EDT by a video enabled telemedicine application and verified that I am speaking with the correct person using two identifiers.  Location patient: home Location provider:work or home office Persons participating in the virtual visit: patient, provider  I discussed the limitations of evaluation and management by telemedicine and the availability of in person appointments. The patient expressed understanding and agreed to proceed. Chief Complaint  Patient presents with   Hospitalization Follow-up     HPI: Pt is an 83 yo male seen for f/u s/p HFU.  Pt feels like he may need supplemental O2 prn.  pO2 93-99% at home.  Has phlegm in throat, post nasal drainage.  Allergra and mucinex  are not helping. Pt with deep productive cough.   Concern for aspiration.  Patient's wife requesting [facility] to demonstrate cleaning and changing.  Requesting provider fill lactulose and spironolactone prescriptions.  Butt wound seems worse. Pt's wife afraid she is making the area worse.  Difficult to keep wound clean due to frequent BMs with lactulose.  Pt taking lactulose one a day instead of BID due to this.  Needs DNR form completed.  Has living will stating wishes.  ROS: See pertinent positives and negatives per HPI.  Past Medical History:  Diagnosis Date   AF (atrial fibrillation) (HCC)    2D ECHO, 03/01/1987 - normal; NUCLEAR STRESS TEST, 04/10/2006 - EF 65%, no ischemia   Bruit    CAROTID DOPPLER, 09/20/2008 - normal, no evidence of diameter reduction, significant tortuousity or any other vascular abnormality   Cerebrovascular accident [facility]) 2003   history of small vessal lacunar stroke 2003   Diabetes mellitus type II    Glaucoma    Hyperlipidemia    Hypertension    Other specified cardiac dysrhythmias(427.89)    PSVT   Peripheral neuropathy     Past Surgical History:  Procedure Laterality Date   ANAL  FISSURE REPAIR     CARDIAC CATHETERIZATION  02/02/2001   Normal LV systolic function, questionable mitral valve prolapse without mitral regurgitation, no evidence of CAD   CARDIAC CATHETERIZATION  09/13/2008   Minimal CAD, normal LV systolic function, no evidence of renal artery stenosis, distal aortic disease, or iliac disease    Family History  Problem Relation Age of Onset   Diabetes Neg Hx    Colon cancer Neg Hx      Current Outpatient Medications:    acetaminophen  (TYLENOL ) 500 MG tablet, Take 500 mg by mouth every 4 (four) hours as needed., Disp: , Rfl:    baclofen  (LIORESAL ) 10 MG tablet, Take 0.5 tablets (5 mg total) by mouth 3 (three) times daily., Disp: 90 each, Rfl: 4   Blood Glucose Monitoring Suppl (ACCU-CHEK AVIVA PLUS) w/Device KIT, USED TO CHECK BLOOD SUGAR 3 TIMES DAILY OR PRN., Disp: 1 kit, Rfl: 0   bumetanide  (BUMEX ) 0.5 MG tablet, Take 1 tablet (0.5 mg total) by mouth daily., Disp: 3 tablet, Rfl: 0   carvedilol  (COREG ) 6.25 MG tablet, Take 1 tablet (6.25 mg total) by mouth 2 (two) times daily with a meal., Disp: 180 tablet, Rfl: 3   diclofenac  Sodium (VOLTAREN ) 1 % GEL, Apply 2 g topically as needed., Disp: , Rfl:    furosemide  (LASIX ) 40 MG tablet, TAKE 1 TABLET(40 MG) BY MOUTH DAILY, Disp: 90 tablet, Rfl: 0   gabapentin  (NEURONTIN ) 100 MG capsule, Take 1-2 capsules (100-200 mg total) by mouth 2 (two) times  daily., Disp: 120 capsule, Rfl: 1   Gauze Bandages (BULKEE II GAUZE 4.5"X4.1YD) MISC, 1 Box by Does not apply route 3 (three) times a week., Disp: 1 each, Rfl: 6   Gauze Pads & Dressings (CURITY ABDOMINAL) 5"X9" PADS, Apply 1 Box topically 3 (three) times a week., Disp: 432 each, Rfl: 0   glucose blood (PRODIGY NO CODING BLOOD GLUC) test strip, Use as instructed to check blood sugars three times daily E11.65, Z79.4, Disp: 300 each, Rfl: 3   guaiFENesin  (MUCINEX ) 600 MG 12 hr tablet, Take by mouth 2 (two) times daily., Disp: , Rfl:    insulin  lispro (HUMALOG ) 100  UNIT/ML KwikPen, INJECT 50 UNITS UNDER THE SKIN AT BREAKFAST, 35 UNITS WITH LUNCH, AND 50 UNITS WITH SUPPER, Disp: 123 mL, Rfl: 5   Insulin  Pen Needle (B-D ULTRAFINE III SHORT PEN) 31G X 8 MM MISC, USE FOUR TIMES DAILY BEFORE MEALS AND AT BEDTIME, Disp: 200 each, Rfl: 5   lactulose (CHRONULAC) 10 GM/15ML solution, Take 20 g by mouth 2 (two) times daily., Disp: , Rfl:    LANTUS  SOLOSTAR 100 UNIT/ML Solostar Pen, ADMINISTER 60 UNITS UNDER THE SKIN AT 10 PM, Disp: 15 mL, Rfl: 2   latanoprost (XALATAN) 0.005 % ophthalmic solution, 1 drop daily. , Disp: , Rfl:    lisinopril  (ZESTRIL ) 40 MG tablet, TAKE 1 TABLET(40 MG) BY MOUTH DAILY, Disp: 90 tablet, Rfl: 1   lovastatin  (MEVACOR ) 40 MG tablet, TAKE 1 TABLET(40 MG) BY MOUTH AT BEDTIME, Disp: 90 tablet, Rfl: 1   metFORMIN  (GLUCOPHAGE ) 1000 MG tablet, TAKE 1 TABLET(1000 MG) BY MOUTH TWICE DAILY WITH A MEAL, Disp: 180 tablet, Rfl: 1   Multiple Vitamins-Minerals (MULTIVITAMIN WITH MINERALS) tablet, Take 1 tablet by mouth daily., Disp: , Rfl:    nystatin  cream (MYCOSTATIN ), APPLY TOPICALLY TO THE AFFECTED AREA TWICE DAILY, Disp: 30 g, Rfl: 1   nystatin  powder, APPLY TOPICALLY THREE TIMES DAILY, Disp: 60 g, Rfl: 0   pantoprazole (PROTONIX) 40 MG tablet, Take 40 mg by mouth daily., Disp: , Rfl:    potassium chloride  SA (KLOR-CON  M) 20 MEQ tablet, TAKE 1 TABLET BY MOUTH DAILY WITH HYDROCHLOROTHIAZIDE  TO PREVENT LOW POTASSIUM, Disp: 90 tablet, Rfl: 3   Prodigy Twist Top Lancets 28G MISC, USE AS DIRECTED TO TEST THREE TIMES DAILY, Disp: 300 each, Rfl: 3   rOPINIRole  (REQUIP ) 1 MG tablet, TAKE 1 TABLET(1 MG) BY MOUTH THREE TIMES DAILY, Disp: 270 tablet, Rfl: 1   spironolactone (ALDACTONE) 50 MG tablet, Take 100 mg by mouth daily., Disp: , Rfl:    timolol  (TIMOPTIC -XR) 0.5 % ophthalmic gel-forming, Place 1 drop into both eyes daily. , Disp: , Rfl:    XARELTO  20 MG TABS tablet, TAKE 1 TABLET BY MOUTH EVERY EVENING, Disp: 90 tablet, Rfl: 3  Current  Facility-Administered Medications:    0.9 %  sodium chloride  infusion, 500 mL, Intravenous, Continuous, Sandrea Cruel, Ellyn Hack, MD  EXAM:  VITALS per patient if applicable: RR between 12-20 bpm.  GENERAL: pt lethargic oriented, appears well and in no acute distress  HEENT: atraumatic, conjunctiva clear, no obvious abnormalities on inspection of external nose and ears  NECK: normal movements of the head and neck  LUNGS: Productive cough.  On inspection no signs of respiratory distress, breathing rate appears normal, no obvious gross SOB, gasping or wheezing  CV: no obvious cyanosis  MS: moves all visible extremities without noticeable abnormality  PSYCH/NEURO: pleasant and cooperative, no obvious depression or anxiety, speech and thought processing grossly intact  ASSESSMENT AND PLAN:  Discussed the following assessment and plan:  Cirrhosis of liver with ascites, unspecified hepatic cirrhosis type (HCC) - Plan: Amb Referral to Palliative Care  Pressure injury of skin of buttock, unspecified injury stage, unspecified laterality - Plan: Amb Referral to Palliative Care  Cough, unspecified type  DNR (do not resuscitate)  Laceration of scrotum, subsequent encounter  In-depth discussion regarding goals of care and palliative care consult.  Concern current level of care need continues to increase.  Living will in place.  Patient wishes to be DNR.  Will provide with goldenrod.  Patient with continued deep productive cough concerning for aspiration.  Discussed various techniques to aid with swallowing.  Discussed wound care.  Patient in a difficult position as taking lactulose twice daily affecting ability to keep pressure wound(s) clean, however advised on the importance of lactulose due to history of cirrhosis with ascites.  Given strict precautions.   I discussed the assessment and treatment plan with the patient. The patient was provided an opportunity to ask questions and all were  answered. The patient agreed with the plan and demonstrated an understanding of the instructions.   The patient was advised to call back or seek an in-person evaluation if the symptoms worsen or if the condition fails to improve as anticipated.   Viola Greulich, MD

## 2024-01-27 DIAGNOSIS — J9811 Atelectasis: Secondary | ICD-10-CM | POA: Diagnosis not present

## 2024-01-27 DIAGNOSIS — E440 Moderate protein-calorie malnutrition: Secondary | ICD-10-CM | POA: Diagnosis not present

## 2024-01-27 DIAGNOSIS — R6521 Severe sepsis with septic shock: Secondary | ICD-10-CM | POA: Diagnosis present

## 2024-01-27 DIAGNOSIS — R000 Tachycardia, unspecified: Secondary | ICD-10-CM | POA: Diagnosis not present

## 2024-01-27 DIAGNOSIS — I8500 Esophageal varices without bleeding: Secondary | ICD-10-CM | POA: Diagnosis not present

## 2024-01-27 DIAGNOSIS — R918 Other nonspecific abnormal finding of lung field: Secondary | ICD-10-CM | POA: Diagnosis not present

## 2024-01-27 DIAGNOSIS — K769 Liver disease, unspecified: Secondary | ICD-10-CM | POA: Diagnosis not present

## 2024-01-27 DIAGNOSIS — A412 Sepsis due to unspecified staphylococcus: Secondary | ICD-10-CM | POA: Diagnosis present

## 2024-01-27 DIAGNOSIS — R1084 Generalized abdominal pain: Secondary | ICD-10-CM | POA: Diagnosis not present

## 2024-01-27 DIAGNOSIS — N390 Urinary tract infection, site not specified: Secondary | ICD-10-CM | POA: Diagnosis present

## 2024-01-27 DIAGNOSIS — Z8673 Personal history of transient ischemic attack (TIA), and cerebral infarction without residual deficits: Secondary | ICD-10-CM | POA: Diagnosis not present

## 2024-01-27 DIAGNOSIS — Z7189 Other specified counseling: Secondary | ICD-10-CM | POA: Diagnosis not present

## 2024-01-27 DIAGNOSIS — N1831 Chronic kidney disease, stage 3a: Secondary | ICD-10-CM | POA: Diagnosis not present

## 2024-01-27 DIAGNOSIS — Z452 Encounter for adjustment and management of vascular access device: Secondary | ICD-10-CM | POA: Diagnosis not present

## 2024-01-27 DIAGNOSIS — R188 Other ascites: Secondary | ICD-10-CM | POA: Diagnosis present

## 2024-01-27 DIAGNOSIS — F419 Anxiety disorder, unspecified: Secondary | ICD-10-CM | POA: Diagnosis not present

## 2024-01-27 DIAGNOSIS — I129 Hypertensive chronic kidney disease with stage 1 through stage 4 chronic kidney disease, or unspecified chronic kidney disease: Secondary | ICD-10-CM | POA: Diagnosis not present

## 2024-01-27 DIAGNOSIS — R0902 Hypoxemia: Secondary | ICD-10-CM | POA: Diagnosis not present

## 2024-01-27 DIAGNOSIS — K7682 Hepatic encephalopathy: Secondary | ICD-10-CM | POA: Diagnosis present

## 2024-01-27 DIAGNOSIS — R161 Splenomegaly, not elsewhere classified: Secondary | ICD-10-CM | POA: Diagnosis not present

## 2024-01-27 DIAGNOSIS — E119 Type 2 diabetes mellitus without complications: Secondary | ICD-10-CM | POA: Diagnosis not present

## 2024-01-27 DIAGNOSIS — E860 Dehydration: Secondary | ICD-10-CM | POA: Diagnosis not present

## 2024-01-27 DIAGNOSIS — K922 Gastrointestinal hemorrhage, unspecified: Secondary | ICD-10-CM | POA: Diagnosis not present

## 2024-01-27 DIAGNOSIS — N179 Acute kidney failure, unspecified: Secondary | ICD-10-CM | POA: Diagnosis not present

## 2024-01-27 DIAGNOSIS — E8729 Other acidosis: Secondary | ICD-10-CM | POA: Diagnosis not present

## 2024-01-27 DIAGNOSIS — L89159 Pressure ulcer of sacral region, unspecified stage: Secondary | ICD-10-CM | POA: Diagnosis not present

## 2024-01-27 DIAGNOSIS — I8510 Secondary esophageal varices without bleeding: Secondary | ICD-10-CM | POA: Diagnosis present

## 2024-01-27 DIAGNOSIS — I81 Portal vein thrombosis: Secondary | ICD-10-CM | POA: Diagnosis present

## 2024-01-27 DIAGNOSIS — E1122 Type 2 diabetes mellitus with diabetic chronic kidney disease: Secondary | ICD-10-CM | POA: Diagnosis not present

## 2024-01-27 DIAGNOSIS — R109 Unspecified abdominal pain: Secondary | ICD-10-CM | POA: Diagnosis not present

## 2024-01-27 DIAGNOSIS — R531 Weakness: Secondary | ICD-10-CM | POA: Diagnosis not present

## 2024-01-27 DIAGNOSIS — L89150 Pressure ulcer of sacral region, unstageable: Secondary | ICD-10-CM | POA: Diagnosis not present

## 2024-01-27 DIAGNOSIS — K7210 Chronic hepatic failure without coma: Secondary | ICD-10-CM | POA: Diagnosis not present

## 2024-01-27 DIAGNOSIS — D684 Acquired coagulation factor deficiency: Secondary | ICD-10-CM | POA: Diagnosis present

## 2024-01-27 DIAGNOSIS — R52 Pain, unspecified: Secondary | ICD-10-CM | POA: Diagnosis not present

## 2024-01-27 DIAGNOSIS — K7460 Unspecified cirrhosis of liver: Secondary | ICD-10-CM | POA: Diagnosis present

## 2024-01-27 DIAGNOSIS — E8809 Other disorders of plasma-protein metabolism, not elsewhere classified: Secondary | ICD-10-CM | POA: Diagnosis present

## 2024-01-27 DIAGNOSIS — K7290 Hepatic failure, unspecified without coma: Secondary | ICD-10-CM | POA: Diagnosis not present

## 2024-01-27 DIAGNOSIS — M549 Dorsalgia, unspecified: Secondary | ICD-10-CM | POA: Diagnosis not present

## 2024-01-27 DIAGNOSIS — R579 Shock, unspecified: Secondary | ICD-10-CM | POA: Diagnosis not present

## 2024-01-27 DIAGNOSIS — K55059 Acute (reversible) ischemia of intestine, part and extent unspecified: Secondary | ICD-10-CM | POA: Diagnosis present

## 2024-01-27 DIAGNOSIS — I2510 Atherosclerotic heart disease of native coronary artery without angina pectoris: Secondary | ICD-10-CM | POA: Diagnosis not present

## 2024-01-27 DIAGNOSIS — R627 Adult failure to thrive: Secondary | ICD-10-CM | POA: Diagnosis present

## 2024-01-27 DIAGNOSIS — Z66 Do not resuscitate: Secondary | ICD-10-CM | POA: Diagnosis present

## 2024-01-27 DIAGNOSIS — I480 Paroxysmal atrial fibrillation: Secondary | ICD-10-CM | POA: Diagnosis not present

## 2024-01-27 DIAGNOSIS — J90 Pleural effusion, not elsewhere classified: Secondary | ICD-10-CM | POA: Diagnosis not present

## 2024-01-27 DIAGNOSIS — E669 Obesity, unspecified: Secondary | ICD-10-CM | POA: Diagnosis not present

## 2024-01-27 DIAGNOSIS — I69354 Hemiplegia and hemiparesis following cerebral infarction affecting left non-dominant side: Secondary | ICD-10-CM | POA: Diagnosis not present

## 2024-01-27 DIAGNOSIS — R0600 Dyspnea, unspecified: Secondary | ICD-10-CM | POA: Diagnosis not present

## 2024-01-27 DIAGNOSIS — K767 Hepatorenal syndrome: Secondary | ICD-10-CM | POA: Diagnosis present

## 2024-01-27 DIAGNOSIS — A419 Sepsis, unspecified organism: Secondary | ICD-10-CM | POA: Diagnosis not present

## 2024-01-27 DIAGNOSIS — E875 Hyperkalemia: Secondary | ICD-10-CM | POA: Diagnosis not present

## 2024-01-27 DIAGNOSIS — G2581 Restless legs syndrome: Secondary | ICD-10-CM | POA: Diagnosis not present

## 2024-01-27 DIAGNOSIS — Z515 Encounter for palliative care: Secondary | ICD-10-CM | POA: Diagnosis not present

## 2024-01-27 DIAGNOSIS — L8995 Pressure ulcer of unspecified site, unstageable: Secondary | ICD-10-CM | POA: Diagnosis not present

## 2024-01-27 DIAGNOSIS — I959 Hypotension, unspecified: Secondary | ICD-10-CM | POA: Diagnosis not present

## 2024-01-29 DIAGNOSIS — L89309 Pressure ulcer of unspecified buttock, unspecified stage: Secondary | ICD-10-CM | POA: Insufficient documentation

## 2024-01-29 DIAGNOSIS — Z66 Do not resuscitate: Secondary | ICD-10-CM | POA: Insufficient documentation

## 2024-02-09 ENCOUNTER — Telehealth: Payer: Self-pay

## 2024-02-09 NOTE — Transitions of Care (Post Inpatient/ED Visit) (Signed)
   02/09/2024  Name: Jimmy Carr MRN: [id number] DOB: [birth year]  Today's TOC FU Call Status:   Patient's Name and Date of Birth confirmed.  Spoke with Glenda at [facility] that patient deceased 2024/03/09.    [place], RN, BSN, CCM [facility], [facility] RN Care Manager Direct Dial : [phone]

## 2024-02-20 ENCOUNTER — Ambulatory Visit (HOSPITAL_BASED_OUTPATIENT_CLINIC_OR_DEPARTMENT_OTHER): Admitting: Internal Medicine

## 2024-06-03 ENCOUNTER — Ambulatory Visit: Admitting: Family Medicine
